# Patient Record
Sex: Male | Born: 1960
Health system: Southern US, Community
[De-identification: ages and names within clinical notes are randomized; demographics above are authoritative.]

## PROBLEM LIST (undated history)

## (undated) ENCOUNTER — Emergency Department (HOSPITAL_COMMUNITY): Admission: EM | Payer: Self-pay | Source: Home / Self Care

## (undated) DIAGNOSIS — Z720 Tobacco use: Secondary | ICD-10-CM

## (undated) DIAGNOSIS — B20 Human immunodeficiency virus [HIV] disease: Secondary | ICD-10-CM

## (undated) DIAGNOSIS — R918 Other nonspecific abnormal finding of lung field: Secondary | ICD-10-CM

## (undated) DIAGNOSIS — J449 Chronic obstructive pulmonary disease, unspecified: Secondary | ICD-10-CM

## (undated) DIAGNOSIS — J189 Pneumonia, unspecified organism: Secondary | ICD-10-CM

## (undated) DIAGNOSIS — R0600 Dyspnea, unspecified: Secondary | ICD-10-CM

## (undated) DIAGNOSIS — M549 Dorsalgia, unspecified: Secondary | ICD-10-CM

## (undated) DIAGNOSIS — Z21 Asymptomatic human immunodeficiency virus [HIV] infection status: Secondary | ICD-10-CM

## (undated) HISTORY — DX: Other nonspecific abnormal finding of lung field: R91.8

## (undated) HISTORY — PX: APPENDECTOMY: SHX54

## (undated) HISTORY — DX: Tobacco use: Z72.0

## (undated) HISTORY — PX: SHOULDER SURGERY: SHX246

---

## 2000-11-15 ENCOUNTER — Encounter (INDEPENDENT_AMBULATORY_CARE_PROVIDER_SITE_OTHER): Payer: Self-pay | Admitting: *Deleted

## 2000-11-15 ENCOUNTER — Inpatient Hospital Stay (HOSPITAL_COMMUNITY): Admission: EM | Admit: 2000-11-15 | Discharge: 2000-11-16 | Payer: Self-pay | Admitting: Emergency Medicine

## 2000-11-15 ENCOUNTER — Encounter: Payer: Self-pay | Admitting: Emergency Medicine

## 2002-11-14 ENCOUNTER — Emergency Department (HOSPITAL_COMMUNITY): Admission: EM | Admit: 2002-11-14 | Discharge: 2002-11-14 | Payer: Self-pay

## 2002-11-15 ENCOUNTER — Emergency Department (HOSPITAL_COMMUNITY): Admission: EM | Admit: 2002-11-15 | Discharge: 2002-11-15 | Payer: Self-pay | Admitting: Emergency Medicine

## 2002-11-21 ENCOUNTER — Ambulatory Visit (HOSPITAL_COMMUNITY): Admission: RE | Admit: 2002-11-21 | Discharge: 2002-11-21 | Payer: Self-pay | Admitting: Sports Medicine

## 2005-08-31 ENCOUNTER — Emergency Department (HOSPITAL_COMMUNITY): Admission: EM | Admit: 2005-08-31 | Discharge: 2005-08-31 | Payer: Self-pay | Admitting: Emergency Medicine

## 2008-01-30 ENCOUNTER — Emergency Department (HOSPITAL_COMMUNITY): Admission: EM | Admit: 2008-01-30 | Discharge: 2008-01-30 | Payer: Self-pay | Admitting: Emergency Medicine

## 2008-02-09 ENCOUNTER — Encounter: Admission: RE | Admit: 2008-02-09 | Discharge: 2008-02-09 | Payer: Self-pay | Admitting: Internal Medicine

## 2008-10-19 ENCOUNTER — Emergency Department (HOSPITAL_COMMUNITY): Admission: EM | Admit: 2008-10-19 | Discharge: 2008-10-19 | Payer: Self-pay | Admitting: Emergency Medicine

## 2010-03-01 ENCOUNTER — Emergency Department (HOSPITAL_COMMUNITY)
Admission: EM | Admit: 2010-03-01 | Discharge: 2010-03-01 | Disposition: A | Discharge status: Home / Self Care | Payer: Self-pay | Attending: Emergency Medicine | Admitting: Emergency Medicine

## 2010-03-01 ENCOUNTER — Emergency Department (HOSPITAL_COMMUNITY): Payer: Self-pay

## 2010-03-01 DIAGNOSIS — J449 Chronic obstructive pulmonary disease, unspecified: Secondary | ICD-10-CM | POA: Insufficient documentation

## 2010-03-01 DIAGNOSIS — R079 Chest pain, unspecified: Secondary | ICD-10-CM | POA: Insufficient documentation

## 2010-03-01 DIAGNOSIS — J4489 Other specified chronic obstructive pulmonary disease: Secondary | ICD-10-CM | POA: Insufficient documentation

## 2010-03-01 DIAGNOSIS — F17200 Nicotine dependence, unspecified, uncomplicated: Secondary | ICD-10-CM | POA: Insufficient documentation

## 2010-03-01 DIAGNOSIS — H5789 Other specified disorders of eye and adnexa: Secondary | ICD-10-CM | POA: Insufficient documentation

## 2010-03-01 LAB — POCT I-STAT, CHEM 8
Calcium, Ion: 1.09 mmol/L — ABNORMAL LOW (ref 1.12–1.32)
Glucose, Bld: 85 mg/dL (ref 70–99)
HCT: 45 % (ref 39.0–52.0)
Hemoglobin: 15.3 g/dL (ref 13.0–17.0)
Potassium: 4 mEq/L (ref 3.5–5.1)

## 2010-03-01 LAB — CBC
Hemoglobin: 15 g/dL (ref 13.0–17.0)
MCHC: 35.4 g/dL (ref 30.0–36.0)
RDW: 13.4 % (ref 11.5–15.5)

## 2010-03-01 LAB — POCT CARDIAC MARKERS: Myoglobin, poc: 54.3 ng/mL (ref 12–200)

## 2010-03-06 ENCOUNTER — Institutional Professional Consult (permissible substitution) (INDEPENDENT_AMBULATORY_CARE_PROVIDER_SITE_OTHER): Payer: Self-pay | Admitting: Cardiovascular Disease

## 2010-03-06 DIAGNOSIS — R079 Chest pain, unspecified: Secondary | ICD-10-CM

## 2010-05-25 NOTE — Op Note (Signed)
[facility]  Patient:    [name], Calvin Schroeder Visit Number: [id number] MRN: [id number]          Service Type: MED Location: 3W [id number] Attending Physician:  Meredith Leeds Dictated by:   Zigmund Daniel, M.D. Proc. Date: 11/15/00 Admit Date:  11/15/2000 Discharge Date: 11/16/2000                             Operative Report  PREOPERATIVE DIAGNOSIS:   Acute appendicitis.  POSTOPERATIVE DIAGNOSIS:  Acute appendicitis.  OPERATION:  Laparoscopic appendectomy.  SURGEON:  Zigmund Daniel, M.D.  ANESTHESIA:  General.  DESCRIPTION OF PROCEDURE:  After the patient had adequate monitoring and induction of general anesthesia, insertion of a Foley catheter and routine preparation and draping of the abdomen, I made a short transverse incision just below his umbilicus.  I dissected down through the fascia and opened it longitudinally in the midline for about 1.5 cm.  I then placed an O Vicryl pursestring suture and secured a Hasson cannula after bluntly entering the peritoneum.  There was no free fluid.  The small bowel and colon appeared normal.  The small bowel and colon appeared normal.  The appendix was enlarged and inflamed.  I placed a right upper quadrant 5 mm port and suprapubic 12 mm port after anesthetizing the sites.  I then grasped the appendix and elevated it and found it to be mobile.  I dissected a little bit of the bulk of the mesoappendix away and then stabilized across the mesoappendix and base of the appendix with the endovascular height cutting stapler.  That almost completely divided the appendix but one further firing of the stapler was necessary and then it came of cleanly.  The closure appeared secure, and hemostasis was good.  I then removed the appendix from the body through the umbilical incision and a plastic pouch.  I checked again for hemostasis and found it to be good.  I tied the pursestring suture and then removed the  lateral port under direct vision and the suprapubic port was removed after releasing the CO2.  I closed all the skin incisions with intracuticular 4-0 Vicryl.  I closed the umbilical incision with the pursestring suture and the fascia. Sponge, needle and instrument counts were correct.  The patient was stable through the operation. Dictated by:   Zigmund Daniel, M.D. Attending Physician:  Meredith Leeds DD:  11/15/00 TD:  11/17/00 Job: [id number]

## 2012-06-24 ENCOUNTER — Encounter (HOSPITAL_COMMUNITY): Payer: Self-pay | Admitting: *Deleted

## 2012-06-24 ENCOUNTER — Emergency Department (HOSPITAL_COMMUNITY)
Admission: EM | Admit: 2012-06-24 | Discharge: 2012-06-24 | Disposition: A | Discharge status: Home / Self Care | Payer: Self-pay | Attending: Emergency Medicine | Admitting: Emergency Medicine

## 2012-06-24 DIAGNOSIS — K047 Periapical abscess without sinus: Secondary | ICD-10-CM | POA: Insufficient documentation

## 2012-06-24 DIAGNOSIS — F17200 Nicotine dependence, unspecified, uncomplicated: Secondary | ICD-10-CM | POA: Insufficient documentation

## 2012-06-24 HISTORY — DX: Dorsalgia, unspecified: M54.9

## 2012-06-24 MED ORDER — PENICILLIN V POTASSIUM 500 MG PO TABS: 500.0000 mg | ORAL_TABLET | Freq: Three times a day (TID) | ORAL | Status: DC

## 2012-06-24 NOTE — Progress Notes (Signed)
[facility] has seen patient and provided him with a OC application, as well as, a list pf primary care resources, highlighting [facility].

## 2012-06-24 NOTE — ED Notes (Signed)
Pt states the past 11 days has had a R sided tooth abscess, been taking amoxicillin, states today abscess "popped in mouth" and he is tasting puss and blood now, pt states in severe pain, R side of face swollen.

## 2012-06-24 NOTE — ED Provider Notes (Signed)
History     CSN: [id number]  Arrival date & time 06/24/12  0911   First MD Initiated Contact with Patient 06/24/12 0915      Chief Complaint  Patient presents with  . Dental Pain    (Consider location/radiation/quality/duration/timing/severity/associated sxs/prior treatment) HPI Comments: The patient is a 52 year old otherwise healthy male who presents with dental pain that started gradually 11 days ago. The dental pain is severe, constant and progressively worsening. The pain is aching and located in right upper jaw. The pain does not radiate. Eating makes the pain worse. Nothing makes the pain better. Patient reports taking his friend's amoxicillin for the abscess and today the abscess popped in his mouth and he is now tasting blood and pus. The patient has not tried anything for pain. No associated symptoms. Patient denies headache, neck pain/stiffness, fever, NVD, edema, sore throat, throat swelling, wheezing, SOB, chest pain, abdominal pain.      Past Medical History  Diagnosis Date  . Back pain     Past Surgical History  Procedure Laterality Date  . Appendectomy    . Shoulder surgery      No family history on file.  History  Substance Use Topics  . Smoking status: Current Every Day Smoker  . Smokeless tobacco: Never Used  . Alcohol Use: Yes      Review of Systems  HENT: Positive for dental problem.   All other systems reviewed and are negative.    Allergies  Review of patient's allergies indicates no known allergies.  Home Medications   Current Outpatient Rx  Name  Route  Sig  Dispense  Refill  . amoxicillin (AMOXIL) 500 MG capsule   Oral   Take 500 mg by mouth 3 (three) times daily. Started on 06-13-12 for 10 day therapy         . naproxen sodium (ANAPROX) 220 MG tablet   Oral   Take 220 mg by mouth 2 (two) times daily with a meal.           BP 131/78  Pulse 94  Temp(Src) 98.8 F (37.1 C) (Oral)  Resp 18  SpO2 95%  Physical Exam   Nursing note and vitals reviewed. Constitutional: He appears well-developed and well-nourished. No distress.  HENT:  Head: Normocephalic and atraumatic.  Mouth/Throat: No oropharyngeal exudate.  Poor dentition. Area of fluctuance noted at gingival fold superior to right upper molars.   Eyes: Conjunctivae and EOM are normal.  Neck: Normal range of motion.  Cardiovascular: Normal rate and regular rhythm.  Exam reveals no gallop and no friction rub.   No murmur heard. Pulmonary/Chest: Effort normal and breath sounds normal. He has no wheezes. He has no rales. He exhibits no tenderness.  Musculoskeletal: Normal range of motion.  Lymphadenopathy:    He has no cervical adenopathy.  Neurological: He is alert.  Speech is goal-oriented. Moves limbs without ataxia.   Skin: Skin is warm and dry.  Psychiatric: He has a normal mood and affect. His behavior is normal.    ED Course  Procedures (including critical care time)  Labs Reviewed - No data to display No results found.   1. Dental abscess       MDM  9:54 AM Dr. Juleen China drained the abscess. Patient will be discharged with antibiotics. Patient declines pain medication. Vitals stable and patient afebrile. Patient will have dentist follow up.        Emilia Beck, [place] 06/25/12 [phone]

## 2012-06-25 NOTE — ED Provider Notes (Signed)
Medical screening examination/treatment/procedure(s) were performed by non-physician practitioner and as supervising physician I was immediately available for consultation/collaboration.  Trayvond Viets, MD 06/25/12 1653 

## 2012-09-03 ENCOUNTER — Emergency Department (HOSPITAL_COMMUNITY)
Admission: EM | Admit: 2012-09-03 | Discharge: 2012-09-03 | Disposition: A | Discharge status: Home / Self Care | Payer: Self-pay | Attending: Emergency Medicine | Admitting: Emergency Medicine

## 2012-09-03 ENCOUNTER — Emergency Department (HOSPITAL_COMMUNITY): Payer: Self-pay

## 2012-09-03 ENCOUNTER — Encounter (HOSPITAL_COMMUNITY): Payer: Self-pay | Admitting: Emergency Medicine

## 2012-09-03 DIAGNOSIS — R509 Fever, unspecified: Secondary | ICD-10-CM | POA: Insufficient documentation

## 2012-09-03 DIAGNOSIS — R5381 Other malaise: Secondary | ICD-10-CM | POA: Insufficient documentation

## 2012-09-03 DIAGNOSIS — Z791 Long term (current) use of non-steroidal anti-inflammatories (NSAID): Secondary | ICD-10-CM | POA: Insufficient documentation

## 2012-09-03 DIAGNOSIS — Z792 Long term (current) use of antibiotics: Secondary | ICD-10-CM | POA: Insufficient documentation

## 2012-09-03 DIAGNOSIS — J159 Unspecified bacterial pneumonia: Secondary | ICD-10-CM | POA: Insufficient documentation

## 2012-09-03 DIAGNOSIS — R05 Cough: Secondary | ICD-10-CM | POA: Insufficient documentation

## 2012-09-03 DIAGNOSIS — J189 Pneumonia, unspecified organism: Secondary | ICD-10-CM

## 2012-09-03 DIAGNOSIS — K047 Periapical abscess without sinus: Secondary | ICD-10-CM | POA: Insufficient documentation

## 2012-09-03 DIAGNOSIS — M549 Dorsalgia, unspecified: Secondary | ICD-10-CM | POA: Insufficient documentation

## 2012-09-03 DIAGNOSIS — R059 Cough, unspecified: Secondary | ICD-10-CM | POA: Insufficient documentation

## 2012-09-03 DIAGNOSIS — R112 Nausea with vomiting, unspecified: Secondary | ICD-10-CM | POA: Insufficient documentation

## 2012-09-03 DIAGNOSIS — R630 Anorexia: Secondary | ICD-10-CM | POA: Insufficient documentation

## 2012-09-03 DIAGNOSIS — R109 Unspecified abdominal pain: Secondary | ICD-10-CM | POA: Insufficient documentation

## 2012-09-03 DIAGNOSIS — R220 Localized swelling, mass and lump, head: Secondary | ICD-10-CM | POA: Insufficient documentation

## 2012-09-03 DIAGNOSIS — R0789 Other chest pain: Secondary | ICD-10-CM | POA: Insufficient documentation

## 2012-09-03 DIAGNOSIS — F17200 Nicotine dependence, unspecified, uncomplicated: Secondary | ICD-10-CM | POA: Insufficient documentation

## 2012-09-03 LAB — BASIC METABOLIC PANEL
CO2: 22 mEq/L (ref 19–32)
Chloride: 98 mEq/L (ref 96–112)
Sodium: 129 mEq/L — ABNORMAL LOW (ref 135–145)

## 2012-09-03 LAB — CBC WITH DIFFERENTIAL/PLATELET
Eosinophils Relative: 0 % (ref 0–5)
HCT: 48.3 % (ref 39.0–52.0)
Lymphocytes Relative: 10 % — ABNORMAL LOW (ref 12–46)
Lymphs Abs: 0.9 10*3/uL (ref 0.7–4.0)
MCV: 95.6 fL (ref 78.0–100.0)
Monocytes Absolute: 1.2 10*3/uL — ABNORMAL HIGH (ref 0.1–1.0)
Platelets: 151 10*3/uL (ref 150–400)
RBC: 5.05 MIL/uL (ref 4.22–5.81)
WBC: 9.5 10*3/uL (ref 4.0–10.5)

## 2012-09-03 LAB — TROPONIN I: Troponin I: <0.3 ng/mL (ref <0.30)

## 2012-09-03 MED ORDER — ONDANSETRON 4 MG PO TBDP: 4.0000 mg | ORAL_TABLET | Freq: Three times a day (TID) | ORAL | Status: DC | PRN

## 2012-09-03 MED ORDER — AZITHROMYCIN 250 MG PO TABS: 250.0000 mg | ORAL_TABLET | Freq: Every day | ORAL | Status: DC

## 2012-09-03 MED ORDER — AZITHROMYCIN 250 MG PO TABS
500.0000 mg | ORAL_TABLET | Freq: Once | ORAL | Status: AC
  Administered 2012-09-03: 500 mg via ORAL
  Filled 2012-09-03: qty 2

## 2012-09-03 MED ORDER — ONDANSETRON HCL 4 MG PO TABS
4.0000 mg | ORAL_TABLET | Freq: Once | ORAL | Status: AC
  Administered 2012-09-03: 4 mg via ORAL
  Filled 2012-09-03: qty 1

## 2012-09-03 MED ORDER — PENICILLIN V POTASSIUM 500 MG PO TABS: 500.0000 mg | ORAL_TABLET | Freq: Three times a day (TID) | ORAL | Status: DC

## 2012-09-03 MED ORDER — IOHEXOL 300 MG/ML  SOLN
100.0000 mL | Freq: Once | INTRAMUSCULAR | Status: AC | PRN
  Administered 2012-09-03: 80 mL via INTRAVENOUS

## 2012-09-03 MED ORDER — SODIUM CHLORIDE 0.9 % IV BOLUS (SEPSIS)
1000.0000 mL | Freq: Once | INTRAVENOUS | Status: AC
  Administered 2012-09-03: 1000 mL via INTRAVENOUS

## 2012-09-03 NOTE — ED Notes (Signed)
Returned from CT.

## 2012-09-03 NOTE — ED Provider Notes (Signed)
CSN: [id number]     Arrival date & time 09/03/12  1823 History  This chart was scribed for Coral Ceo, PA working with Raeford Razor, MD by Quintella Reichert, ED Scribe. This patient was seen in room WTR7/WTR7 and the patient's care was started at 6:38 PM.    Chief Complaint  Patient presents with  . Abscess  . Back Pain   And The history is provided by the patient. No language interpreter was used.    HPI Comments: Calvin Schroeder is a 52 y.o. male who presents to the Emergency Department complaining of an abscess to the inside of his right upper cheek that has been present for 2-3 months.  He also has had 2 days of associated nausea, vomiting, decreased appetite, low-grade subjective fever, and chills.  Pt states that he has a bad tooth in the area of the abscess.  The abscess is only painful on palpation and he denies dental pain.  He has not taken any pain medications pta.  He notes that he see in the ED and was on antibiotics (penicillin) 2 months ago (06/24/12) for the same dental abscess, but it never completely resolved.  He did not follow up with a dentist.  Pt states he has been vomiting "bile" and vomited 3 times today.  He did not take his temperature pta and on admission temperature is 98.9 F.  Pt also notes that 2 days ago he was pulling a nail out of a post and "strained a muscle" and suddenly developed mid-back pain that has resolved somewhat since then.  He is concerned that he "popped a capillary" in his cheek at that time and that this may be why he developed his other symptoms simultaneously.  He also complains of mild left-sided dull chest pain, which is present only after vomiting.  No chest pain at rest.  He also has chest pain with deep breathing.  He is a current every-day smoker and has a chronic "smoker's cough."  No hx of heart disease.  No FH of cardiac disease.  Patient denies cocaine use.  He denies diarrhea, constipation, ear pain, numbness or tingling in legs, bowel  or bladder incontinence, or any other associated symptoms. He denies chronic medical conditions or regular medication usage.     Past Medical History  Diagnosis Date  . Back pain    Past Surgical History  Procedure Laterality Date  . Appendectomy    . Shoulder surgery     History reviewed. No pertinent family history. History  Substance Use Topics  . Smoking status: Current Every Day Smoker  . Smokeless tobacco: Never Used  . Alcohol Use: Yes    Review of Systems  Constitutional: Positive for fever (subjective and not measured at home), chills, appetite change and fatigue. Negative for diaphoresis and activity change.  HENT: Positive for facial swelling and dental problem. Negative for ear pain, congestion, sore throat, rhinorrhea, mouth sores, trouble swallowing, neck pain and neck stiffness.        Oral abscess  Eyes: Negative for photophobia and visual disturbance.  Respiratory: Positive for cough. Negative for shortness of breath and wheezing.   Cardiovascular: Positive for chest pain. Negative for palpitations and leg swelling.  Gastrointestinal: Positive for nausea, vomiting and abdominal pain (only after emesis). Negative for diarrhea, constipation and rectal pain.  Genitourinary: Negative for dysuria, hematuria and difficulty urinating.  Musculoskeletal: Positive for back pain. Negative for myalgias, joint swelling and gait problem.  Skin: Negative for wound.  Neurological: Negative for dizziness, syncope, weakness, light-headedness, numbness and headaches.  All other systems reviewed and are negative.    Allergies  Review of patient's allergies indicates no known allergies.  Home Medications   Current Outpatient Rx  Name  Route  Sig  Dispense  Refill  . amoxicillin (AMOXIL) 500 MG capsule   Oral   Take 500 mg by mouth 3 (three) times daily. Started on 06-13-12 for 10 day therapy         . naproxen sodium (ANAPROX) 220 MG tablet   Oral   Take 220 mg by mouth 2  (two) times daily with a meal.         . penicillin v potassium (VEETID) 500 MG tablet   Oral   Take 1 tablet (500 mg total) by mouth 3 (three) times daily.   30 tablet   0    BP 132/71  Pulse 86  Temp(Src) 98.9 F (37.2 C) (Oral)  Resp 20  SpO2 98%  Filed Vitals:   09/03/12 2115 09/03/12 2130 09/03/12 2200 09/03/12 2245  BP:    140/77  Pulse: 79 92 87 85  Temp:    98.2 F (36.8 C)  TempSrc:    Oral  Resp: 24 20 17 16   SpO2: 96% 98% 99% 98%   In Physical Exam  Nursing note and vitals reviewed. Constitutional: He is oriented to person, place, and time. He appears well-developed and well-nourished. No distress.  HENT:  Head: Normocephalic and atraumatic.    Right Ear: External ear normal.  Left Ear: External ear normal.  Nose: Nose normal.  Mouth/Throat: Oropharynx is clear and moist. No oropharyngeal exudate.  Non-fluctuant non-indurated mass approximately 2 cm x 2 cm palpated in the right lower cheek.  Small area of the abscess is palpated in the right upper gingival fold.  Poor dentition throughout  Eyes: Conjunctivae and EOM are normal. Pupils are equal, round, and reactive to light. Right eye exhibits no discharge. Left eye exhibits no discharge.  Neck: Normal range of motion. Neck supple. No tracheal deviation present.  Cardiovascular: Normal rate, regular rhythm, normal heart sounds and intact distal pulses.   No murmur heard. DP pulses present and equal bilaterally  Pulmonary/Chest: Effort normal and breath sounds normal. No respiratory distress. He has no wheezes. He has no rales.  Abdominal: Soft. He exhibits no distension. There is no tenderness. There is no rebound and no guarding.  Musculoskeletal: Normal range of motion. He exhibits no edema and no tenderness.  No lumbar or thoracic spinal or paraspinal tenderness to palpation.  Tenderness to palpation to the left lateral ribs diffusely with no palpable fractures, crepitus, or overlying  lesions/erythema/eccymosis/rash.  Patient able to ambulate without difficulty or ataxia.  Strength 5/5 in the lower extremities bilaterally  Neurological: He is alert and oriented to person, place, and time. Coordination and gait normal.  Gross sensation intact in the lower extremities bilaterally  Skin: Skin is warm and dry.  Psychiatric: He has a normal mood and affect. His behavior is normal.    ED Course  Procedures (including critical care time)  DIAGNOSTIC STUDIES: Oxygen Saturation is 98% on room air, normal by my interpretation.    COORDINATION OF CARE: 6:55 PM-Discussed treatment plan which includes Zofran in ED to see if pt can tolerate oral intake with pt at bedside and pt agreed to plan.   Labs Review Labs Reviewed - No data to display  Imaging Review No results found.  DG Chest 2  View (Final result)  Result time: 09/03/12 19:29:12    Final result by Rad Results In Interface (09/03/12 19:29:12)    Narrative:   *RADIOLOGY REPORT*  Clinical Data: Back pain, fever, smoker  CHEST - 2 VIEW  Comparison: 03/01/2010  Findings: Normal heart size, mediastinal contours, and pulmonary vascularity. Emphysematous and minimal bronchitic changes consistent with COPD. Opacity at medial left lung base may represent infiltrate or mass. Remaining lungs clear. No pleural effusion or pneumothorax. Bones unremarkable.  IMPRESSION: Opacity at medial left lung base, suspect posteriorly and medial left lower lobe, potentially infiltrate though mass lesion is not excluded. If the patient has signs and symptoms of pneumonia or leukocytosis, recommend follow-up chest radiographs in 2-4 weeks to ensure resolution in order to exclude mass lesion. Otherwise, recommend CT chest with contrast to exclude left lower lobe mass.   Original Report Authenticated By: Ulyses Southward, M.D.   Results for orders placed during the hospital encounter of 09/03/12  TROPONIN I      Result Value Range    Troponin I <0.30  <0.30 ng/mL  BASIC METABOLIC PANEL      Result Value Range   Sodium 129 (*) 135 - 145 mEq/L   Potassium 3.4 (*) 3.5 - 5.1 mEq/L   Chloride 98  96 - 112 mEq/L   CO2 22  19 - 32 mEq/L   Glucose, Bld 123 (*) 70 - 99 mg/dL   BUN 11  6 - 23 mg/dL   Creatinine, Ser 1.61  0.50 - 1.35 mg/dL   Calcium 8.6  8.4 - 09.6 mg/dL   GFR calc non Af Amer >90  >90 mL/min   GFR calc Af Amer >90  >90 mL/min  CBC WITH DIFFERENTIAL      Result Value Range   WBC 9.5  4.0 - 10.5 K/uL   RBC 5.05  4.22 - 5.81 MIL/uL   Hemoglobin 17.3 (*) 13.0 - 17.0 g/dL   HCT 04.5  40.9 - 81.1 %   MCV 95.6  78.0 - 100.0 fL   MCH 34.3 (*) 26.0 - 34.0 pg   MCHC 35.8  30.0 - 36.0 g/dL   RDW 91.4  78.2 - 95.6 %   Platelets 151  150 - 400 K/uL   Neutrophils Relative % 78 (*) 43 - 77 %   Neutro Abs 7.3  1.7 - 7.7 K/uL   Lymphocytes Relative 10 (*) 12 - 46 %   Lymphs Abs 0.9  0.7 - 4.0 K/uL   Monocytes Relative 13 (*) 3 - 12 %   Monocytes Absolute 1.2 (*) 0.1 - 1.0 K/uL   Eosinophils Relative 0  0 - 5 %   Eosinophils Absolute 0.0  0.0 - 0.7 K/uL   Basophils Relative 0  0 - 1 %   Basophils Absolute 0.0  0.0 - 0.1 K/uL     Date: 09/03/2012  Rate: 91  Rhythm: normal sinus rhythm with LVH   QRS Axis: normal  Intervals: normal  ST/T Wave abnormalities: normal  Conduction Disutrbances:none  Narrative Interpretation:   Old EKG Reviewed: 03/01/10 - unchanged   MDM   1. Community acquired pneumonia   2. Dental abscess      Calvin Schroeder is a 52 year old male who presents to the Emergency Department complaining of an abscess to the inside of his right cheek that has been present for 2-3 months, with 2 days of associated nausea, vomiting, decreased appetite, low-grade subjective fever, and chills.  CBC, BMP, troponin, EKG  and chest x-ray ordered to further evaluate.     Rechecks  9:00 PM = Spoke with patient he would like to get CT scan to figure out what is going on.  He states his nausea has  improved with Zofran.     9:20 = Signed out care to [facility].  Chest x-ray concerning for pneumonia vs. mass. CT ordered to further evaluate.  Troponin negative.  No acute ischemic changes on EKG.  Patient informed of his results.  He was encouraged to follow-up with a cardiologist.  Will await CT scan results.  No leukocytosis or fever at this time. Patient was given IV fluids for dehydration.  Patient will require dental follow-up for his dental abscess as well as OP antibiotics. Abscess not I&D's at this time.  Patient was also staffed with Dr. Blinda Leatherwood.      Luiz Iron PA-C   I personally performed the services described in this documentation, which was scribed in my presence. The recorded information has been reviewed and is accurate.  Jillyn Ledger, PA-C 09/04/12 1009

## 2012-09-03 NOTE — ED Notes (Signed)
Pt c/o of "pulled muscle in back" when lifting boxes, mid back pain. Also c/o of abscess to right cheek. States that he has had it drained before. Swelling and redness.

## 2012-09-03 NOTE — ED Provider Notes (Signed)
10:37 PM Patient seen and examined. Work-up initiated. Medications ordered.   Vital signs reviewed and are as follows: Filed Vitals:   09/03/12 1947  BP: 129/77  Pulse:   Temp:   Resp: 20   CT reviewed by myself. Consolidation appears to be PNA. Will start on azithro. Also given penicillin due to dental related abscess. Pt informed of results.   Patient urged to return with worsening symptoms or other concerns including high fever, N/V, worsening chest or back pain. Patient verbalized understanding and agrees with plan.     Renne Crigler, PA-C 09/05/12 [phone]

## 2012-09-07 NOTE — ED Provider Notes (Signed)
Medical screening examination/treatment/procedure(s) were performed by non-physician practitioner and as supervising physician I was immediately available for consultation/collaboration.    Manuelita Moxon J. Neo Yepiz, MD 09/07/12 0730 

## 2012-09-07 NOTE — ED Provider Notes (Signed)
Medical screening examination/treatment/procedure(s) were performed by non-physician practitioner and as supervising physician I was immediately available for consultation/collaboration.    Gilda Crease, MD 09/07/12 0730

## 2013-01-06 ENCOUNTER — Encounter (HOSPITAL_COMMUNITY): Payer: Self-pay | Admitting: Emergency Medicine

## 2013-01-06 ENCOUNTER — Emergency Department (HOSPITAL_COMMUNITY)
Admission: EM | Admit: 2013-01-06 | Discharge: 2013-01-06 | Disposition: A | Discharge status: Home / Self Care | Payer: Self-pay | Attending: Emergency Medicine | Admitting: Emergency Medicine

## 2013-01-06 DIAGNOSIS — M545 Low back pain, unspecified: Secondary | ICD-10-CM | POA: Insufficient documentation

## 2013-01-06 DIAGNOSIS — G8929 Other chronic pain: Secondary | ICD-10-CM | POA: Insufficient documentation

## 2013-01-06 DIAGNOSIS — R52 Pain, unspecified: Secondary | ICD-10-CM | POA: Insufficient documentation

## 2013-01-06 DIAGNOSIS — F17200 Nicotine dependence, unspecified, uncomplicated: Secondary | ICD-10-CM | POA: Insufficient documentation

## 2013-01-06 MED ORDER — OXYCODONE-ACETAMINOPHEN 5-325 MG PO TABS
1.0000 | ORAL_TABLET | Freq: Once | ORAL | Status: AC
  Administered 2013-01-06: 1 via ORAL
  Filled 2013-01-06: qty 1

## 2013-01-06 MED ORDER — PREDNISONE 10 MG PO TABS: ORAL_TABLET | ORAL | Status: DC

## 2013-01-06 MED ORDER — CYCLOBENZAPRINE HCL 10 MG PO TABS: 10.0000 mg | ORAL_TABLET | Freq: Two times a day (BID) | ORAL | Status: DC | PRN

## 2013-01-06 MED ORDER — OXYCODONE-ACETAMINOPHEN 5-325 MG PO TABS: 1.0000 | ORAL_TABLET | ORAL | Status: DC | PRN

## 2013-01-06 MED ORDER — PREDNISONE 20 MG PO TABS
60.0000 mg | ORAL_TABLET | Freq: Once | ORAL | Status: AC
  Administered 2013-01-06: 60 mg via ORAL
  Filled 2013-01-06: qty 3

## 2013-01-06 NOTE — ED Notes (Addendum)
Pt escorted to discharge window. Verbalized understanding discharge instructions. In no acute distress.  Pt educated to not drive or operate heavy machinery.

## 2013-01-06 NOTE — ED Provider Notes (Signed)
CSN: [id number]     Arrival date & time 01/06/13  0508 History   First MD Initiated Contact with Patient 01/06/13 [phone]     Chief Complaint  Patient presents with  . Tailbone Pain   (Consider location/radiation/quality/duration/timing/severity/associated sxs/prior Treatment) Patient is a 52 y.o. male presenting with back pain. The history is provided by the patient. No language interpreter was used.  Back Pain Location:  Lumbar spine Radiates to:  R thigh Pain severity:  Severe Associated symptoms: no fever and no numbness   Associated symptoms comment:  Lower back pain that feels like an exacerbation of recurrent/chronic pain since heavy lifting a couple days ago. No urinary or bowel incontinence. The pain radiates into right leg like it has in the past.    Past Medical History  Diagnosis Date  . Back pain    Past Surgical History  Procedure Laterality Date  . Appendectomy    . Shoulder surgery     History reviewed. No pertinent family history. History  Substance Use Topics  . Smoking status: Current Every Day Smoker  . Smokeless tobacco: Never Used  . Alcohol Use: Yes    Review of Systems  Constitutional: Negative for fever and chills.  Gastrointestinal: Negative.   Genitourinary: Negative.  Negative for difficulty urinating.  Musculoskeletal: Positive for back pain.       See HPI  Skin: Negative.   Neurological: Negative.  Negative for numbness.    Allergies  Review of patient's allergies indicates no known allergies.  Home Medications  No current outpatient prescriptions on file. BP 109/80  Pulse 78  Temp(Src) 98 F (36.7 C) (Oral)  SpO2 100% Physical Exam  Constitutional: He is oriented to person, place, and time. He appears well-developed and well-nourished.  Neck: Normal range of motion.  Pulmonary/Chest: Effort normal.  Abdominal: Soft. He exhibits no mass. There is no tenderness.  Musculoskeletal: Normal range of motion.  Left paralumbar tenderness  without swelling, discoloration.   Neurological: He is alert and oriented to person, place, and time. He has normal reflexes. No sensory deficit.  Skin: Skin is warm and dry.  Psychiatric: He has a normal mood and affect.    ED Course  Procedures (including critical care time) Labs Review Labs Reviewed - No data to display Imaging Review No results found.  EKG Interpretation   None       MDM  No diagnosis found. 1. Acute on chronic lower back pain  No neurologic red flags. VSS.     Arnoldo Hooker, PA-C 01/06/13 [phone]

## 2013-01-06 NOTE — ED Notes (Signed)
Patient has history of back pain. Patient states he lifted a heavy object 3 days ago and has had lower back pain since.

## 2013-01-18 NOTE — ED Provider Notes (Signed)
Medical screening examination/treatment/procedure(s) were performed by non-physician practitioner and as supervising physician I was immediately available for consultation/collaboration.  EKG Interpretation   None         Tanna Furry, MD 01/18/13 [phone]

## 2013-06-01 ENCOUNTER — Telehealth: Payer: Self-pay | Admitting: Hematology and Oncology

## 2013-06-01 NOTE — Telephone Encounter (Signed)
S/W PATIENT AND GAVE NP APPT FOR 06/04 @ 11 W/DR, GORSUCH.  [place] PACKET MAILED.

## 2013-06-08 ENCOUNTER — Telehealth: Payer: Self-pay | Admitting: Hematology and Oncology

## 2013-06-08 NOTE — Telephone Encounter (Signed)
[facility]  REFERRAL # [id number] START DATE 06/07/2013 NUMBER OF VISIT 6

## 2013-06-10 ENCOUNTER — Ambulatory Visit (HOSPITAL_BASED_OUTPATIENT_CLINIC_OR_DEPARTMENT_OTHER): Payer: 59 | Admitting: Hematology and Oncology

## 2013-06-10 ENCOUNTER — Ambulatory Visit: Payer: 59

## 2013-06-10 ENCOUNTER — Ambulatory Visit (HOSPITAL_COMMUNITY)
Admission: RE | Admit: 2013-06-10 | Discharge: 2013-06-10 | Disposition: A | Discharge status: Home / Self Care | Payer: 59 | Source: Ambulatory Visit | Attending: Hematology and Oncology | Admitting: Hematology and Oncology

## 2013-06-10 ENCOUNTER — Telehealth: Payer: Self-pay | Admitting: Hematology and Oncology

## 2013-06-10 ENCOUNTER — Ambulatory Visit (HOSPITAL_BASED_OUTPATIENT_CLINIC_OR_DEPARTMENT_OTHER): Payer: 59

## 2013-06-10 ENCOUNTER — Encounter: Payer: Self-pay | Admitting: Hematology and Oncology

## 2013-06-10 VITALS — BP 113/79 | HR 72 | Temp 98.1°F | Resp 18 | Ht 70.0 in | Wt 139.8 lb

## 2013-06-10 DIAGNOSIS — R918 Other nonspecific abnormal finding of lung field: Secondary | ICD-10-CM

## 2013-06-10 DIAGNOSIS — Z720 Tobacco use: Secondary | ICD-10-CM

## 2013-06-10 DIAGNOSIS — J984 Other disorders of lung: Secondary | ICD-10-CM | POA: Insufficient documentation

## 2013-06-10 DIAGNOSIS — D751 Secondary polycythemia: Secondary | ICD-10-CM

## 2013-06-10 DIAGNOSIS — D696 Thrombocytopenia, unspecified: Secondary | ICD-10-CM

## 2013-06-10 DIAGNOSIS — D72819 Decreased white blood cell count, unspecified: Secondary | ICD-10-CM

## 2013-06-10 DIAGNOSIS — F17200 Nicotine dependence, unspecified, uncomplicated: Secondary | ICD-10-CM

## 2013-06-10 DIAGNOSIS — R222 Localized swelling, mass and lump, trunk: Secondary | ICD-10-CM

## 2013-06-10 DIAGNOSIS — Z87891 Personal history of nicotine dependence: Secondary | ICD-10-CM | POA: Insufficient documentation

## 2013-06-10 HISTORY — DX: Other nonspecific abnormal finding of lung field: R91.8

## 2013-06-10 HISTORY — DX: Tobacco use: Z72.0

## 2013-06-10 LAB — COMPREHENSIVE METABOLIC PANEL (CC13)
ALK PHOS: 74 U/L (ref 40–150)
ALT: 28 U/L (ref 0–55)
AST: 28 U/L (ref 5–34)
Albumin: 3.4 g/dL — ABNORMAL LOW (ref 3.5–5.0)
Anion Gap: 11 mEq/L (ref 3–11)
BUN: 13.2 mg/dL (ref 7.0–26.0)
CHLORIDE: 105 meq/L (ref 98–109)
CO2: 23 mEq/L (ref 22–29)
Calcium: 8.5 mg/dL (ref 8.4–10.4)
Creatinine: 0.7 mg/dL (ref 0.7–1.3)
Glucose: 89 mg/dl (ref 70–140)
POTASSIUM: 3.9 meq/L (ref 3.5–5.1)
SODIUM: 139 meq/L (ref 136–145)
TOTAL PROTEIN: 7.8 g/dL (ref 6.4–8.3)
Total Bilirubin: 0.6 mg/dL (ref 0.20–1.20)

## 2013-06-10 LAB — CBC WITH DIFFERENTIAL/PLATELET
BASO%: 0.2 % (ref 0.0–2.0)
Basophils Absolute: 0 10*3/uL (ref 0.0–0.1)
EOS ABS: 0.1 10*3/uL (ref 0.0–0.5)
EOS%: 2.2 % (ref 0.0–7.0)
HEMATOCRIT: 50.8 % — AB (ref 38.4–49.9)
HGB: 17.8 g/dL — ABNORMAL HIGH (ref 13.0–17.1)
LYMPH#: 1.2 10*3/uL (ref 0.9–3.3)
LYMPH%: 29.6 % (ref 14.0–49.0)
MCH: 34.6 pg — ABNORMAL HIGH (ref 27.2–33.4)
MCHC: 35 g/dL (ref 32.0–36.0)
MCV: 98.8 fL — ABNORMAL HIGH (ref 79.3–98.0)
MONO#: 0.6 10*3/uL (ref 0.1–0.9)
MONO%: 13.2 % (ref 0.0–14.0)
NEUT%: 54.8 % (ref 39.0–75.0)
NEUTROS ABS: 2.3 10*3/uL (ref 1.5–6.5)
Platelets: 121 10*3/uL — ABNORMAL LOW (ref 140–400)
RBC: 5.14 10*6/uL (ref 4.20–5.82)
RDW: 13.9 % (ref 11.0–14.6)
WBC: 4.2 10*3/uL (ref 4.0–10.3)

## 2013-06-10 LAB — FERRITIN CHCC: FERRITIN: 123 ng/mL (ref 22–316)

## 2013-06-10 MED ORDER — SODIUM CHLORIDE 0.9 % IV SOLN
Freq: Once | INTRAVENOUS | Status: AC
  Administered 2013-06-10: 500 mL via INTRAVENOUS

## 2013-06-10 NOTE — Progress Notes (Signed)
Removed 230cc blood per phlebotomy from left [facility] before site clotted off; patient then received 500cc normal saline bolus, then removed 280cc from right AC before clotted off; patient tolerated well; discharge instructions given to patient; verbalized understanding of next appointment; post vital signs stable; no complaints at discharge.   Total removed = 520cc

## 2013-06-10 NOTE — Assessment & Plan Note (Signed)
This is likely due to smoking and emphysema. I will order an additional workup to exclude myeloproliferative disorder. In the meantime, I recommend him phlebotomy today along with aspirin therapy daily to prevent risk of blood clot.

## 2013-06-10 NOTE — Assessment & Plan Note (Signed)
I reviewed his last imaging study with him and his wife. Due to his smoking history, I am very concerned about the abnormal lung mass noted on the left lower lobe. I recommend repeat checks x-ray to exclude persistent mass. If the chest x-ray is abnormal, I will proceed with CT imaging study.

## 2013-06-10 NOTE — Telephone Encounter (Signed)
gv adn printede appt sched and avs for June...sed added tx.Marland KitchenMarland Kitchenpt will go to lab after phl

## 2013-06-10 NOTE — Assessment & Plan Note (Addendum)
I spent some time counseling the patient the importance of tobacco cessation. He is not interested to quit.

## 2013-06-10 NOTE — Patient Instructions (Signed)
Therapeutic Phlebotomy Therapeutic phlebotomy is the controlled removal of blood from your body for the purpose of treating a medical condition. It is similar to donating blood. Usually, about a pint (470 mL) of blood is removed. The average adult has 9 to 12 pints (4.3 to 5.7 L) of blood. Therapeutic phlebotomy may be used to treat the following medical conditions:  Hemochromatosis. This is a condition in which there is too much iron in the blood.  Polycythemia vera. This is a condition in which there are too many red cells in the blood.  Porphyria cutanea tarda. This is a disease usually passed from one generation to the next (inherited). It is a condition in which an important part of hemoglobin is not made properly. This results in the build up of abnormal amounts of porphyrins in the body.  Sickle cell disease. This is an inherited disease. It is a condition in which the red blood cells form an abnormal crescent shape rather than a round shape. LET YOUR CAREGIVER KNOW ABOUT:  Allergies.  Medicines taken including herbs, eyedrops, over-the-counter medicines, and creams.  Use of steroids (by mouth or creams).  Previous problems with anesthetics or numbing medicine.  History of blood clots.  History of bleeding or blood problems.  Previous surgery.  Possibility of pregnancy, if this applies. RISKS AND COMPLICATIONS This is a simple and safe procedure. Problems are unlikely. However, problems can occur and may include:  Nausea or lightheadedness.  Low blood pressure.  Soreness, bleeding, swelling, or bruising at the needle insertion site.  Infection. BEFORE THE PROCEDURE  This is a procedure that can be done as an outpatient. Confirm the time that you need to arrive for your procedure. Confirm whether there is a need to fast or withhold any medications. It is helpful to wear clothing with sleeves that can be raised above the elbow. A blood sample may be done to determine the  amount of red blood cells or iron in your blood. Plan ahead of time to have someone drive you home after the procedure. PROCEDURE The entire procedure from preparation through recovery takes about 1 hour. The actual collection takes about 10 to 15 minutes.  A needle will be inserted into your vein.  Tubing and a collection bag will be attached to that needle.  Blood will flow through the needle and tubing into the collection bag.  You may be asked to open and close your hand slowly and continuously during the entire collection.  Once the specified amount of blood has been removed from your body, the collection bag and tubing will be clamped.  The needle will be removed.  Pressure will be held on the site of the needle insertion to stop the bleeding. Then a bandage will be placed over the needle insertion site. AFTER THE PROCEDURE  Your recovery will be assessed and monitored. If there are no problems, as an outpatient, you should be able to go home shortly after the procedure.  Document Released: 05/28/2010 Document Revised: 03/18/2011 Document Reviewed: 05/28/2010 [facility] Patient Information 2014 [place], [place].  Therapeutic Phlebotomy Care After Refer to this sheet in the next few weeks. These instructions provide you with information on caring for yourself after your procedure. Your caregiver may also give you more specific instructions. Your treatment has been planned according to current medical practices, but problems sometimes occur. Call your caregiver if you have any problems or questions after your procedure. HOME CARE INSTRUCTIONS Most people can go back to their normal  activities right away. Before you leave, be sure to ask if there is anything you should or should not do. In general, it would be wise to:  Keep the bandage dry. You can remove the bandage after about 5 hours.  Eat well-balanced meals for the next 24 hours.  Drink enough fluids to keep your urine clear or  pale yellow.  Avoid drinking alcohol minimally until after eating.  Avoid smoking for at least 30 minutes after the procedure.  Avoid strenous physical activity or heavy lifting or pulling for about 5 hours after the procedure.  Athletes should avoid strenous exercise for 12 hours or more.  Change positions slowly for the remainder of the day to prevent lightheadedness or fainting.  If you feel lightheaded, lie down until the feeling subsides.  If you have bleeding from the needle insertion site, elevate your arm and press firmly on the site until the bleeding stops.  If bruising or bleeding appears under the skin, apply ice to the area for 15 to 20 minutes, 3 to 4 times per day. Put the ice in a plastic bag and place a towel between the bag of ice and your skin. Do this while you are awake for the first 24 hours. The ice packs can be stopped before 24 hours if the swelling goes away. If swelling persists after 24 hours, a warm, moist washcloth can be applied to the area for 15 to 20 minutes, 3 to 4 times per day. The warm, moist treatments can be stopped when the swelling goes away.  It is important to continue further therapeutic phlebotomy as directed by your caregiver. SEEK MEDICAL CARE IF:  There is bleeding or fluid leaking from the needle insertion site.  The needle insertion site becomes swollen, red, or sore.  You feel lightheaded, dizzy or nauseated, and the feeling does not go away.  You notice new bruising at the needle insertion site.  You feel more weak or tired than normal.  You develop a fever. SEEK IMMEDIATE MEDICAL CARE IF:   There is increased bleeding, pain, or swelling from the needle insertion site.  You have severe nausea or vomiting.  You have chest pain.  You have trouble breathing. MAKE SURE YOU:  Understand these instructions.  Will watch your condition.  Will get help right away if you are not doing well or get worse. Document Released:  05/28/2010 Document Revised: 03/18/2011 Document Reviewed: 05/28/2010 [facility] Patient Information 2014 [place], [place].

## 2013-06-10 NOTE — Progress Notes (Signed)
[place] NOTE  Patient Care Team: [name], MD as PCP - General (Internal Medicine)  CHIEF COMPLAINTS/PURPOSE OF CONSULTATION:  Polycythemia  HISTORY OF PRESENTING ILLNESS:  Calvin Schroeder 53 y.o. male is here because of elevated hemoglobin.  He was found to have abnormal CBC from routine blood work. His last blood work from August 2014 showed hemoglobin of 17.3. Recently, he was noted to have mild leukopenia and thrombocytopenia. Please see scanned report for further details. The patient is a smoker and currently smokes 1-2 pack of cigarettes per day for the last 34 years. The patient denies intermittent headaches.  He denies any shortness of breath on exertion. He complains of frequent leg cramps and occasional chest pain. He never suffer from diagnosis of blood clot.  No prior diagnosis of obstructive sleep apnea. He denies weight loss or skin itching.   MEDICAL HISTORY:  Past Medical History  Diagnosis Date  . Back pain   . Lung mass 06/10/2013  . Tobacco abuse 06/10/2013    SURGICAL HISTORY: Past Surgical History  Procedure Laterality Date  . Appendectomy    . Shoulder surgery      SOCIAL HISTORY: History   Social History  . Marital Status: Single    Spouse Name: N/A    Number of Children: N/A  . Years of Education: N/A   Occupational History  . Not on file.   Social History Main Topics  . Smoking status: Current Every Day Smoker -- 1.50 packs/day for 34 years  . Smokeless tobacco: Never Used  . Alcohol Use: Yes  . Drug Use: Yes    Special: Marijuana  . Sexual Activity: Not on file   Other Topics Concern  . Not on file   Social History Narrative  . No narrative on file    FAMILY HISTORY: Family History  Problem Relation Age of Onset  . Cancer Mother     lung ca  . Cancer Father     skin ca  . Cancer Maternal Uncle     liver ca  . Cancer Maternal Grandmother     bladder ca    ALLERGIES:  has No Known  Allergies.  MEDICATIONS:  No current outpatient prescriptions on file.   No current facility-administered medications for this visit.    REVIEW OF SYSTEMS:   Constitutional: Denies fevers, chills or abnormal night sweats Eyes: Denies blurriness of vision, double vision or watery eyes Ears, nose, mouth, throat, and face: Denies mucositis or sore throat Respiratory: Denies cough, dyspnea or wheezes Gastrointestinal:  Denies nausea, heartburn or change in bowel habits Skin: Denies abnormal skin rashes Lymphatics: Denies new lymphadenopathy or easy bruising Neurological:Denies numbness, tingling or new weaknesses Behavioral/Psych: Mood is stable, no new changes  All other systems were reviewed with the patient and are negative.  PHYSICAL EXAMINATION: ECOG PERFORMANCE STATUS: 0 - Asymptomatic  Filed Vitals:   06/10/13 1134  BP: 113/79  Pulse: 72  Temp: 98.1 F (36.7 C)  Resp: 18   Filed Weights   06/10/13 1134  Weight: 139 lb 12.8 oz (63.413 kg)    GENERAL:alert, no distress and comfortable. He looks thin but not cachectic. SKIN: skin color is plethoric, texture, turgor are normal, no rashes or significant lesions EYES: normal, conjunctiva are pink and non-injected, sclera clear OROPHARYNX:no exudate, no erythema and lips, buccal mucosa, and tongue normal  NECK: supple, thyroid normal size, non-tender, without nodularity LYMPH:  no palpable lymphadenopathy in the cervical, axillary or inguinal  LUNGS: clear to auscultation and percussion with normal breathing effort HEART: regular rate & rhythm and no murmurs and no lower extremity edema ABDOMEN:abdomen soft, non-tender and normal bowel sounds. No splenomegaly Musculoskeletal:no cyanosis of digits and no clubbing  PSYCH: alert & oriented x 3 with fluent speech NEURO: no focal motor/sensory deficits  LABORATORY DATA:  I have reviewed the data as listed Recent Results (from the past 2160 hour(s))  CBC WITH DIFFERENTIAL      Status: Abnormal   Collection Time    06/10/13  1:14 PM      Result Value Ref Range   WBC 4.2  4.0 - 10.3 10e3/uL   NEUT# 2.3  1.5 - 6.5 10e3/uL   HGB 17.8 (*) 13.0 - 17.1 g/dL   HCT 50.8 (*) 38.4 - 49.9 %   Platelets 121 (*) 140 - 400 10e3/uL   MCV 98.8 (*) 79.3 - 98.0 fL   MCH 34.6 (*) 27.2 - 33.4 pg   MCHC 35.0  32.0 - 36.0 g/dL   RBC 5.14  4.20 - 5.82 10e6/uL   RDW 13.9  11.0 - 14.6 %   lymph# 1.2  0.9 - 3.3 10e3/uL   MONO# 0.6  0.1 - 0.9 10e3/uL   Eosinophils Absolute 0.1  0.0 - 0.5 10e3/uL   Basophils Absolute 0.0  0.0 - 0.1 10e3/uL   NEUT% 54.8  39.0 - 75.0 %   LYMPH% 29.6  14.0 - 49.0 %   MONO% 13.2  0.0 - 14.0 %   EOS% 2.2  0.0 - 7.0 %   BASO% 0.2  0.0 - 2.0 %  FERRITIN [facility]     Status: None   Collection Time    06/10/13  1:14 PM      Result Value Ref Range   Ferritin 123  22 - 316 ng/ml  COMPREHENSIVE METABOLIC PANEL (KV42)     Status: Abnormal   Collection Time    06/10/13  1:14 PM      Result Value Ref Range   Sodium 139  136 - 145 mEq/L   Potassium 3.9  3.5 - 5.1 mEq/L   Chloride 105  98 - 109 mEq/L   CO2 23  22 - 29 mEq/L   Glucose 89  70 - 140 mg/dl   BUN 13.2  7.0 - 26.0 mg/dL   Creatinine 0.7  0.7 - 1.3 mg/dL   Total Bilirubin 0.60  0.20 - 1.20 mg/dL   Alkaline Phosphatase 74  40 - 150 U/L   AST 28  5 - 34 U/L   ALT 28  0 - 55 U/L   Total Protein 7.8  6.4 - 8.3 g/dL   Albumin 3.4 (*) 3.5 - 5.0 g/dL   Calcium 8.5  8.4 - 10.4 mg/dL   Anion Gap 11  3 - 11 mEq/L    RADIOGRAPHIC STUDIES: I also reviewed his prior CT scan which showed emphysematous changes but also a new mass in the left lower lobe I have personally reviewed the radiological images as listed and agreed with the findings in the report. Dg Chest 2 View  06/10/2013   CLINICAL DATA:  History of lung mass and history of smoking  EXAM: CHEST  2 VIEW  COMPARISON:  CT scan of the chest of September 03, 2012 and PA and lateral chest x-ray of the same date.  FINDINGS: The lungs are hyperinflated with  hemidiaphragm flattening. Previously demonstrated density in the left lower paravertebral region is not evident today. There is subtle  increased density which projects over the posterior aspect of the eighth, ninth rib which is stable. There is no pleural effusion or pneumothorax. The heart and mediastinal structures are normal. The bony thorax is unremarkable.  IMPRESSION: There is no residual abnormality in the left lower lobe medially. There are stable emphysematous change but no acute cardiopulmonary abnormality.   Electronically Signed   By: David  Martinique   On: 06/10/2013 16:17    ASSESSMENT & PLAN Erythrocytosis This is likely due to smoking and emphysema. I will order an additional workup to exclude myeloproliferative disorder. In the meantime, I recommend him phlebotomy today along with aspirin therapy daily to prevent risk of blood clot.  Tobacco abuse I spent some time counseling the patient the importance of tobacco cessation. He is not interested to quit.   Lung mass I reviewed his last imaging study with him and his wife. Due to his smoking history, I am very concerned about the abnormal lung mass noted on the left lower lobe. I recommend repeat checks x-ray to exclude persistent mass. If the chest x-ray is abnormal, I will proceed with CT imaging study.

## 2013-06-15 LAB — ERYTHROPOIETIN: Erythropoietin: 4.3 m[IU]/mL (ref 2.6–18.5)

## 2013-06-29 ENCOUNTER — Ambulatory Visit: Payer: 59 | Admitting: Hematology and Oncology

## 2013-06-29 ENCOUNTER — Telehealth: Payer: Self-pay | Admitting: *Deleted

## 2013-06-29 ENCOUNTER — Telehealth: Payer: Self-pay | Admitting: Nurse Practitioner

## 2013-06-29 ENCOUNTER — Other Ambulatory Visit: Payer: 59

## 2013-06-29 NOTE — Telephone Encounter (Signed)
RN returned patient's previous call asking lab results and CXR report. Per MD, chest Xray okay. Hgb is increased and pt may need more phlebotomy. Pt verbalized understanding. He is asking specifically hgb and RBC values, RN informed him. Pt wants to reschedule appt for early august when insurance coverage restarts. Pt instructed to call scheduling to get appt and he verbalizes understanding.

## 2013-06-29 NOTE — Telephone Encounter (Signed)
Pt left a message that he was unable to keep appt today due to insurance being cancelled. Would like results of CXR and labs. Wants to reschedule appointment for August

## 2013-07-14 ENCOUNTER — Telehealth: Payer: Self-pay | Admitting: Hematology and Oncology

## 2013-07-14 NOTE — Telephone Encounter (Signed)
pt called to r/s missed appt...done....pt aware of new d.t °

## 2013-07-20 ENCOUNTER — Encounter: Payer: Self-pay | Admitting: Hematology and Oncology

## 2013-07-20 ENCOUNTER — Ambulatory Visit (HOSPITAL_BASED_OUTPATIENT_CLINIC_OR_DEPARTMENT_OTHER): Payer: BC Managed Care – PPO | Admitting: Hematology and Oncology

## 2013-07-20 ENCOUNTER — Other Ambulatory Visit (HOSPITAL_BASED_OUTPATIENT_CLINIC_OR_DEPARTMENT_OTHER): Payer: BC Managed Care – PPO

## 2013-07-20 VITALS — BP 115/73 | HR 67 | Temp 97.9°F | Resp 18 | Ht 70.0 in | Wt 139.8 lb

## 2013-07-20 DIAGNOSIS — D751 Secondary polycythemia: Secondary | ICD-10-CM

## 2013-07-20 DIAGNOSIS — F17200 Nicotine dependence, unspecified, uncomplicated: Secondary | ICD-10-CM

## 2013-07-20 DIAGNOSIS — R918 Other nonspecific abnormal finding of lung field: Secondary | ICD-10-CM

## 2013-07-20 DIAGNOSIS — R222 Localized swelling, mass and lump, trunk: Secondary | ICD-10-CM

## 2013-07-20 DIAGNOSIS — Z720 Tobacco use: Secondary | ICD-10-CM

## 2013-07-20 LAB — CBC WITH DIFFERENTIAL/PLATELET
BASO%: 0.3 % (ref 0.0–2.0)
Basophils Absolute: 0 10*3/uL (ref 0.0–0.1)
EOS%: 2 % (ref 0.0–7.0)
Eosinophils Absolute: 0.1 10*3/uL (ref 0.0–0.5)
HCT: 44.1 % (ref 38.4–49.9)
HGB: 14.8 g/dL (ref 13.0–17.1)
LYMPH%: 33 % (ref 14.0–49.0)
MCH: 33.1 pg (ref 27.2–33.4)
MCHC: 33.5 g/dL (ref 32.0–36.0)
MCV: 98.8 fL — AB (ref 79.3–98.0)
MONO#: 0.5 10*3/uL (ref 0.1–0.9)
MONO%: 9.8 % (ref 0.0–14.0)
NEUT#: 2.6 10*3/uL (ref 1.5–6.5)
NEUT%: 54.9 % (ref 39.0–75.0)
PLATELETS: 155 10*3/uL (ref 140–400)
RBC: 4.47 10*6/uL (ref 4.20–5.82)
RDW: 13.2 % (ref 11.0–14.6)
WBC: 4.8 10*3/uL (ref 4.0–10.3)
lymph#: 1.6 10*3/uL (ref 0.9–3.3)

## 2013-07-20 NOTE — Assessment & Plan Note (Signed)
I spent some time counseling the patient the importance of tobacco cessation. he is currently attempting to quit on his own 

## 2013-07-20 NOTE — Progress Notes (Signed)
[facility] OFF[facility] PROGRESS NOTE  Calvin Pel, MD   SUMMARY OF HEMATOLOGIC HISTORY:  He was found to have abnormal CBC from routine blood work. His last blood work from August 2014 showed hemoglobin of 17.3. Recently, he was noted to have mild leukopenia and thrombocytopenia. Please see scanned report for further details. The patient is a smoker and currently smokes 1-2 pack of cigarettes per day for the last 34 years.  He complains of frequent leg cramps and occasional chest pain. He never suffer from diagnosis of blood clot.  No prior diagnosis of obstructive sleep apnea. He denies weight loss or skin itching. In June 2015, he had phlebotomy. Blood work is not consistent with myeloproliferative disorder. He was recommended regular phlebotomy and aspirin. INTERVAL HISTORY: Calvin Schroeder 53 y.o. male returns for further workup. He feels well. He quit drinking but still smokes.  I have reviewed the past medical history, past surgical history, social history and family history with the patient and they are unchanged from previous note.  ALLERGIES:  has No Known Allergies.  MEDICATIONS:  Current Outpatient Prescriptions  Medication Sig Dispense Refill  . aspirin 81 MG tablet Take 81 mg by mouth daily.      . Multiple Vitamin (MULTIVITAMIN) capsule Take 1 capsule by mouth daily.       No current facility-administered medications for this visit.     REVIEW OF SYSTEMS:   Constitutional: Denies fevers, chills or night sweats Eyes: Denies blurriness of vision Ears, nose, mouth, throat, and face: Denies mucositis or sore throat Respiratory: Denies cough, dyspnea or wheezes Cardiovascular: Denies palpitation, chest discomfort or lower extremity swelling Gastrointestinal:  Denies nausea, heartburn or change in bowel habits Skin: Denies abnormal skin rashes Lymphatics: Denies new lymphadenopathy or easy bruising Neurological:Denies numbness, tingling or new  weaknesses Behavioral/Psych: Mood is stable, no new changes  All other systems were reviewed with the patient and are negative.  PHYSICAL EXAMINATION: ECOG PERFORMANCE STATUS: 0 - Asymptomatic  Filed Vitals:   07/20/13 1241  BP: 115/73  Pulse: 67  Temp: 97.9 F (36.6 C)  Resp: 18   Filed Weights   07/20/13 1241  Weight: 139 lb 12.8 oz (63.413 kg)    GENERAL:alert, no distress and comfortable SKIN: skin color, texture, turgor are normal, no rashes or significant lesions. He looks plethoric EYES: normal, Conjunctiva are pink and non-injected, sclera clear NEURO: alert & oriented x 3 with fluent speech, no focal motor/sensory deficits  LABORATORY DATA:  I have reviewed the data as listed Results for orders placed in visit on 07/20/13 (from the past 48 hour(s))  CBC WITH DIFFERENTIAL     Status: Abnormal   Collection Time    07/20/13 12:10 PM      Result Value Ref Range   WBC 4.8  4.0 - 10.3 10e3/uL   NEUT# 2.6  1.5 - 6.5 10e3/uL   HGB 14.8  13.0 - 17.1 g/dL   HCT 44.1  38.4 - 49.9 %   Platelets 155  140 - 400 10e3/uL   MCV 98.8 (*) 79.3 - 98.0 fL   MCH 33.1  27.2 - 33.4 pg   MCHC 33.5  32.0 - 36.0 g/dL   RBC 4.47  4.20 - 5.82 10e6/uL   RDW 13.2  11.0 - 14.6 %   lymph# 1.6  0.9 - 3.3 10e3/uL   MONO# 0.5  0.1 - 0.9 10e3/uL   Eosinophils Absolute 0.1  0.0 - 0.5 10e3/uL   Basophils Absolute 0.0  0.0 - 0.1 10e3/uL   NEUT% 54.9  39.0 - 75.0 %   LYMPH% 33.0  14.0 - 49.0 %   MONO% 9.8  0.0 - 14.0 %   EOS% 2.0  0.0 - 7.0 %   BASO% 0.3  0.0 - 2.0 %    Lab Results  Component Value Date   WBC 4.8 07/20/2013   HGB 14.8 07/20/2013   HCT 44.1 07/20/2013   MCV 98.8* 07/20/2013   PLT 155 07/20/2013    RADIOGRAPHIC STUDIES: X-rays show no mass. I have personally reviewed the radiological images as listed and agreed with the findings in the report.  ASSESSMENT & PLAN:  Erythrocytosis This is likely due to smoking and emphysema. His blood work is not compatible with  myeloproliferative disorder. In the meantime, I recommend regular phlebotomy along with aspirin therapy daily to prevent risk of blood clot.    Lung mass Checks x-ray is normal. I recommend he stops smoking.  Tobacco abuse I spent some time counseling the patient the importance of tobacco cessation. he is currently attempting to quit on his own    All questions were answered. The patient knows to call the clinic with any problems, questions or concerns. No barriers to learning was detected.  I spent 15 minutes counseling the patient face to face. The total time spent in the appointment was 20 minutes and more than 50% was on counseling.     [facility], Montserrat Shek, MD 07/20/2013 5:53 PM

## 2013-07-20 NOTE — Assessment & Plan Note (Signed)
Checks x-ray is normal. I recommend he stops smoking.

## 2013-07-20 NOTE — Assessment & Plan Note (Signed)
This is likely due to smoking and emphysema. His blood work is not compatible with myeloproliferative disorder. In the meantime, I recommend regular phlebotomy along with aspirin therapy daily to prevent risk of blood clot.

## 2013-08-17 ENCOUNTER — Telehealth: Payer: Self-pay | Admitting: Hematology and Oncology

## 2013-08-17 NOTE — Telephone Encounter (Signed)
Fax medical records to [facility] to Dr. Shelia Media

## 2013-11-18 ENCOUNTER — Other Ambulatory Visit: Payer: Self-pay | Admitting: Hematology and Oncology

## 2014-01-04 ENCOUNTER — Emergency Department (HOSPITAL_COMMUNITY): Payer: BC Managed Care – PPO

## 2014-01-04 ENCOUNTER — Emergency Department (HOSPITAL_COMMUNITY)
Admission: EM | Admit: 2014-01-04 | Discharge: 2014-01-04 | Disposition: A | Discharge status: Home / Self Care | Payer: BC Managed Care – PPO | Attending: Emergency Medicine | Admitting: Emergency Medicine

## 2014-01-04 ENCOUNTER — Encounter (HOSPITAL_COMMUNITY): Payer: Self-pay

## 2014-01-04 DIAGNOSIS — Z720 Tobacco use: Secondary | ICD-10-CM | POA: Diagnosis not present

## 2014-01-04 DIAGNOSIS — J159 Unspecified bacterial pneumonia: Secondary | ICD-10-CM | POA: Insufficient documentation

## 2014-01-04 DIAGNOSIS — R05 Cough: Secondary | ICD-10-CM | POA: Diagnosis present

## 2014-01-04 DIAGNOSIS — Z7982 Long term (current) use of aspirin: Secondary | ICD-10-CM | POA: Insufficient documentation

## 2014-01-04 DIAGNOSIS — M545 Low back pain: Secondary | ICD-10-CM | POA: Insufficient documentation

## 2014-01-04 DIAGNOSIS — J189 Pneumonia, unspecified organism: Secondary | ICD-10-CM

## 2014-01-04 DIAGNOSIS — R059 Cough, unspecified: Secondary | ICD-10-CM

## 2014-01-04 LAB — COMPREHENSIVE METABOLIC PANEL
ALBUMIN: 3.6 g/dL (ref 3.5–5.2)
ALT: 17 U/L (ref 0–53)
AST: 22 U/L (ref 0–37)
Alkaline Phosphatase: 81 U/L (ref 39–117)
Anion gap: 8 (ref 5–15)
BILIRUBIN TOTAL: 1.5 mg/dL — AB (ref 0.3–1.2)
BUN: 12 mg/dL (ref 6–23)
CHLORIDE: 98 meq/L (ref 96–112)
CO2: 24 mmol/L (ref 19–32)
CREATININE: 0.71 mg/dL (ref 0.50–1.35)
Calcium: 8.6 mg/dL (ref 8.4–10.5)
GFR calc Af Amer: >90 mL/min (ref >90)
Glucose, Bld: 119 mg/dL — ABNORMAL HIGH (ref 70–99)
Potassium: 3.8 mmol/L (ref 3.5–5.1)
Sodium: 130 mmol/L — ABNORMAL LOW (ref 135–145)
Total Protein: 8.6 g/dL — ABNORMAL HIGH (ref 6.0–8.3)

## 2014-01-04 LAB — CBC
HEMATOCRIT: 46.8 % (ref 39.0–52.0)
Hemoglobin: 16.3 g/dL (ref 13.0–17.0)
MCH: 32.9 pg (ref 26.0–34.0)
MCHC: 34.8 g/dL (ref 30.0–36.0)
MCV: 94.4 fL (ref 78.0–100.0)
Platelets: 130 10*3/uL — ABNORMAL LOW (ref 150–400)
RBC: 4.96 MIL/uL (ref 4.22–5.81)
RDW: 13.3 % (ref 11.5–15.5)
WBC: 8.2 10*3/uL (ref 4.0–10.5)

## 2014-01-04 MED ORDER — ONDANSETRON HCL 4 MG/2ML IJ SOLN: 4.0000 mg | Freq: Once | INTRAMUSCULAR | Status: DC

## 2014-01-04 MED ORDER — AMOXICILLIN 500 MG PO CAPS: 500.0000 mg | ORAL_CAPSULE | Freq: Three times a day (TID) | ORAL | Status: DC

## 2014-01-04 MED ORDER — HYDROCODONE-ACETAMINOPHEN 5-325 MG PO TABS
2.0000 | ORAL_TABLET | Freq: Once | ORAL | Status: DC
  Filled 2014-01-04: qty 2

## 2014-01-04 MED ORDER — HYDROMORPHONE HCL 1 MG/ML IJ SOLN
1.0000 mg | Freq: Once | INTRAMUSCULAR | Status: DC
  Filled 2014-01-04: qty 1

## 2014-01-04 MED ORDER — SODIUM CHLORIDE 0.9 % IV BOLUS (SEPSIS)
1000.0000 mL | Freq: Once | INTRAVENOUS | Status: AC
  Administered 2014-01-04: 1000 mL via INTRAVENOUS

## 2014-01-04 MED ORDER — TRAMADOL HCL 50 MG PO TABS: 50.0000 mg | ORAL_TABLET | Freq: Four times a day (QID) | ORAL | Status: DC | PRN

## 2014-01-04 MED ORDER — METOCLOPRAMIDE HCL 5 MG/ML IJ SOLN
10.0000 mg | Freq: Once | INTRAMUSCULAR | Status: AC
  Administered 2014-01-04: 10 mg via INTRAVENOUS
  Filled 2014-01-04: qty 2

## 2014-01-04 MED ORDER — AZITHROMYCIN 500 MG IV SOLR
500.0000 mg | Freq: Once | INTRAVENOUS | Status: AC
  Administered 2014-01-04: 500 mg via INTRAVENOUS
  Filled 2014-01-04: qty 500

## 2014-01-04 MED ORDER — DEXTROSE 5 % IV SOLN
1.0000 g | Freq: Once | INTRAVENOUS | Status: AC
  Administered 2014-01-04: 1 g via INTRAVENOUS
  Filled 2014-01-04: qty 10

## 2014-01-04 MED ORDER — AZITHROMYCIN 250 MG PO TABS: ORAL_TABLET | ORAL | Status: DC

## 2014-01-04 NOTE — Discharge Instructions (Signed)
It was our pleasure to provide your ER care today - we hope that you feel better.  Rest. Drink plenty of fluids.  Take zithromax and amoxicillin (antibiotics) as prescribed.  You may take ultram as need for pain - no driving when taking.  You were given pain medication in the ER - no driving for the next 4 hours.  Follow up with primary care doctor in the next couple days for recheck.  Return to ER if worse, new symptoms, trouble breathing, other concern.     Pneumonia Pneumonia is an infection of the lungs.  CAUSES Pneumonia may be caused by bacteria or a virus. Usually, these infections are caused by breathing infectious particles into the lungs (respiratory tract). SIGNS AND SYMPTOMS   Cough.  Fever.  Chest pain.  Increased rate of breathing.  Wheezing.  Mucus production. DIAGNOSIS  If you have the common symptoms of pneumonia, your health care provider will typically confirm the diagnosis with a chest X-ray. The X-ray will show an abnormality in the lung (pulmonary infiltrate) if you have pneumonia. Other tests of your blood, urine, or sputum may be done to find the specific cause of your pneumonia. Your health care provider may also do tests (blood gases or pulse oximetry) to see how well your lungs are working. TREATMENT  Some forms of pneumonia may be spread to other people when you cough or sneeze. You may be asked to wear a mask before and during your exam. Pneumonia that is caused by bacteria is treated with antibiotic medicine. Pneumonia that is caused by the influenza virus may be treated with an antiviral medicine. Most other viral infections must run their course. These infections will not respond to antibiotics.  HOME CARE INSTRUCTIONS   Cough suppressants may be used if you are losing too much rest. However, coughing protects you by clearing your lungs. You should avoid using cough suppressants if you can.  Your health care provider may have prescribed  medicine if he or she thinks your pneumonia is caused by bacteria or influenza. Finish your medicine even if you start to feel better.  Your health care provider may also prescribe an expectorant. This loosens the mucus to be coughed up.  Take medicines only as directed by your health care provider.  Do not smoke. Smoking is a common cause of bronchitis and can contribute to pneumonia. If you are a smoker and continue to smoke, your cough may last several weeks after your pneumonia has cleared.  A cold steam vaporizer or humidifier in your room or home may help loosen mucus.  Coughing is often worse at night. Sleeping in a semi-upright position in a recliner or using a couple pillows under your head will help with this.  Get rest as you feel it is needed. Your body will usually let you know when you need to rest. PREVENTION A pneumococcal shot (vaccine) is available to prevent a common bacterial cause of pneumonia. This is usually suggested for:  People over 59 years old.  Patients on chemotherapy.  People with chronic lung problems, such as bronchitis or emphysema.  People with immune system problems. If you are over 65 or have a high risk condition, you may receive the pneumococcal vaccine if you have not received it before. In some countries, a routine influenza vaccine is also recommended. This vaccine can help prevent some cases of pneumonia.You may be offered the influenza vaccine as part of your care. If you smoke, it is time to  quit. You may receive instructions on how to stop smoking. Your health care provider can provide medicines and counseling to help you quit. SEEK MEDICAL CARE IF: You have a fever. SEEK IMMEDIATE MEDICAL CARE IF:   Your illness becomes worse. This is especially true if you are elderly or weakened from any other disease.  You cannot control your cough with suppressants and are losing sleep.  You begin coughing up blood.  You develop pain which is  getting worse or is uncontrolled with medicines.  Any of the symptoms which initially brought you in for treatment are getting worse rather than better.  You develop shortness of breath or chest pain. MAKE SURE YOU:   Understand these instructions.  Will watch your condition.  Will get help right away if you are not doing well or get worse. Document Released: 12/24/2004 Document Revised: 05/10/2013 Document Reviewed: 03/15/2010 [facility] Patient Information 2015 [place], [place]. This information is not intended to replace advice given to you by your health care provider. Make sure you discuss any questions you have with your health care provider.    Back Pain, Adult Low back pain is very common. About 1 in 5 people have back pain.The cause of low back pain is rarely dangerous. The pain often gets better over time.About half of people with a sudden onset of back pain feel better in just 2 weeks. About 8 in 10 people feel better by 6 weeks.  CAUSES Some common causes of back pain include:  Strain of the muscles or ligaments supporting the spine.  Wear and tear (degeneration) of the spinal discs.  Arthritis.  Direct injury to the back. DIAGNOSIS Most of the time, the direct cause of low back pain is not known.However, back pain can be treated effectively even when the exact cause of the pain is unknown.Answering your caregiver's questions about your overall health and symptoms is one of the most accurate ways to make sure the cause of your pain is not dangerous. If your caregiver needs more information, he or she may order lab work or imaging tests (X-rays or MRIs).However, even if imaging tests show changes in your back, this usually does not require surgery. HOME CARE INSTRUCTIONS For many people, back pain returns.Since low back pain is rarely dangerous, it is often a condition that people can learn to [facility] their own.   Remain active. It is stressful on the back to sit or  stand in one place. Do not sit, drive, or stand in one place for more than 30 minutes at a time. Take short walks on level surfaces as soon as pain allows.Try to increase the length of time you walk each day.  Do not stay in bed.Resting more than 1 or 2 days can delay your recovery.  Do not avoid exercise or work.Your body is made to move.It is not dangerous to be active, even though your back may hurt.Your back will likely heal faster if you return to being active before your pain is gone.  Pay attention to your body when you bend and lift. Many people have less discomfortwhen lifting if they bend their knees, keep the load close to their bodies,and avoid twisting. Often, the most comfortable positions are those that put less stress on your recovering back.  Find a comfortable position to sleep. Use a firm mattress and lie on your side with your knees slightly bent. If you lie on your back, put a pillow under your knees.  Only take over-the-counter or  prescription medicines as directed by your caregiver. Over-the-counter medicines to reduce pain and inflammation are often the most helpful.Your caregiver may prescribe muscle relaxant drugs.These medicines help dull your pain so you can more quickly return to your normal activities and healthy exercise.  Put ice on the injured area.  Put ice in a plastic bag.  Place a towel between your skin and the bag.  Leave the ice on for 15-20 minutes, 03-04 times a day for the first 2 to 3 days. After that, ice and heat may be alternated to reduce pain and spasms.  Ask your caregiver about trying back exercises and gentle massage. This may be of some benefit.  Avoid feeling anxious or stressed.Stress increases muscle tension and can worsen back pain.It is important to recognize when you are anxious or stressed and learn ways to manage it.Exercise is a great option. SEEK MEDICAL CARE IF:  You have pain that is not relieved with rest or  medicine.  You have pain that does not improve in 1 week.  You have new symptoms.  You are generally not feeling well. SEEK IMMEDIATE MEDICAL CARE IF:   You have pain that radiates from your back into your legs.  You develop new bowel or bladder control problems.  You have unusual weakness or numbness in your arms or legs.  You develop nausea or vomiting.  You develop abdominal pain.  You feel faint. Document Released: 12/24/2004 Document Revised: 06/25/2011 Document Reviewed: 04/27/2013 [facility] Patient Information 2015 [place], [place]. This information is not intended to replace advice given to you by your health care provider. Make sure you discuss any questions you have with your health care provider.

## 2014-01-04 NOTE — ED Provider Notes (Signed)
CSN: [id number]     Arrival date & time 01/04/14  4709 History   First MD Initiated Contact with Patient 01/04/14 [phone]     Chief Complaint  Patient presents with  . Cough     (Consider location/radiation/quality/duration/timing/severity/associated sxs/prior Treatment) The history is provided by the patient.  pt c/o non productive cough in the past few days. Subjective fevers. No chills/sweats. Also c/o lower back pain for the past several days.  Notes hx low back pain in past, feels similar. States recent bending/lifting, and back strain associated w exercise.  No radicular pain. Back pain is dull, moder, constant, non radiating. No numbness/weakness. No urinary incontinence or retention. Pt denies chest pain or discomfort. +nausea. No vomiting. No abd pain or distension. Having normal bms. No known ill contacts.        Past Medical History  Diagnosis Date  . Back pain   . Lung mass 06/10/2013  . Tobacco abuse 06/10/2013   Past Surgical History  Procedure Laterality Date  . Appendectomy    . Shoulder surgery     Family History  Problem Relation Age of Onset  . Cancer Mother     lung ca  . Cancer Father     skin ca  . Cancer Maternal Uncle     liver ca  . Cancer Maternal Grandmother     bladder ca   History  Substance Use Topics  . Smoking status: Current Every Day Smoker -- 1.50 packs/day for 34 years  . Smokeless tobacco: Never Used  . Alcohol Use: No    Review of Systems  Constitutional: Negative for chills and diaphoresis.  HENT: Negative for sore throat.   Eyes: Negative for redness.  Respiratory: Positive for cough. Negative for shortness of breath.   Cardiovascular: Negative for chest pain and leg swelling.  Gastrointestinal: Positive for nausea. Negative for vomiting, abdominal pain and diarrhea.  Genitourinary: Negative for dysuria and flank pain.  Musculoskeletal: Positive for back pain. Negative for neck pain.  Skin: Negative for rash.  Neurological:  Negative for weakness, numbness and headaches.  Hematological: Does not bruise/bleed easily.  Psychiatric/Behavioral: Negative for confusion.      Allergies  Vicodin  Home Medications   Prior to Admission medications   Medication Sig Start Date End Date Taking? Authorizing Provider  aspirin 81 MG tablet Take 81 mg by mouth daily.   Yes Historical Provider, MD  Multiple Vitamin (MULTIVITAMIN) capsule Take 1 capsule by mouth daily.   Yes Historical Provider, MD   BP 137/80 mmHg  Pulse 92  Temp(Src) 98.3 F (36.8 C) (Oral)  Resp 22  SpO2 96% Physical Exam  Constitutional: He is oriented to person, place, and time. He appears well-developed and well-nourished. No distress.  HENT:  Head: Atraumatic.  Mouth/Throat: Oropharynx is clear and moist.  Eyes: Conjunctivae are normal. No scleral icterus.  Neck: Neck supple. No tracheal deviation present.  No stiffness or rigidity  Cardiovascular: Normal rate, regular rhythm, normal heart sounds and intact distal pulses.  Exam reveals no gallop and no friction rub.   No murmur heard. Pulmonary/Chest: Effort normal. No accessory muscle usage. No respiratory distress. He has rales. He exhibits no tenderness.  Rales right mid  Abdominal: Soft. Bowel sounds are normal. He exhibits no distension. There is no tenderness.  Genitourinary:  No cva tenderness  Musculoskeletal: Normal range of motion.  CTLS spine, non tender, aligned, no step off. +lumbar muscular tenderness.   Neurological: He is alert and oriented to person,  place, and time.  Motor intact bil. Steady gait.   Skin: Skin is warm and dry. No rash noted. He is not diaphoretic.  Psychiatric: He has a normal mood and affect.  Nursing note and vitals reviewed.   ED Course  Procedures (including critical care time) Labs Review  Results for orders placed or performed during the hospital encounter of 01/04/14  CBC  Result Value Ref Range   WBC 8.2 4.0 - 10.5 K/uL   RBC 4.96  4.22 - 5.81 MIL/uL   Hemoglobin 16.3 13.0 - 17.0 g/dL   HCT 46.8 39.0 - 52.0 %   MCV 94.4 78.0 - 100.0 fL   MCH 32.9 26.0 - 34.0 pg   MCHC 34.8 30.0 - 36.0 g/dL   RDW 13.3 11.5 - 15.5 %   Platelets 130 (L) 150 - 400 K/uL  Comprehensive metabolic panel  Result Value Ref Range   Sodium 130 (L) 135 - 145 mmol/L   Potassium 3.8 3.5 - 5.1 mmol/L   Chloride 98 96 - 112 mEq/L   CO2 24 19 - 32 mmol/L   Glucose, Bld 119 (H) 70 - 99 mg/dL   BUN 12 6 - 23 mg/dL   Creatinine, Ser 0.71 0.50 - 1.35 mg/dL   Calcium 8.6 8.4 - 10.5 mg/dL   Total Protein 8.6 (H) 6.0 - 8.3 g/dL   Albumin 3.6 3.5 - 5.2 g/dL   AST 22 0 - 37 U/L   ALT 17 0 - 53 U/L   Alkaline Phosphatase 81 39 - 117 U/L   Total Bilirubin 1.5 (H) 0.3 - 1.2 mg/dL   GFR calc non Af Amer >90 >90 mL/min   GFR calc Af Amer >90 >90 mL/min   Anion gap 8 5 - 15   Dg Chest 2 View  01/04/2014   CLINICAL DATA:  Cough.  EXAM: CHEST  2 VIEW  COMPARISON:  June 10, 2013.  FINDINGS: The heart size and mediastinal contours are within normal limits. No pneumothorax or pleural effusion is noted. Left lung is clear. Increased airspace opacity is noted in the right middle lobe consistent with pneumonia. The visualized skeletal structures are unremarkable.  IMPRESSION: Right middle lobe pneumonia. Followup radiographs are recommended to ensure resolution and rule out underlying neoplasm.   Electronically Signed   By: Sabino Dick M.D.   On: 01/04/2014 09:37       MDM   Labs. Cxr.  Pt has ride, does not have to drive.   Requests pain med.  Reviewed nursing notes and prior charts for additional history.   pna on cxr, pt says symptoms seem similar to prior pna.   Will rx for cap.  Rocephin and zithromax iv.  Morphine iv for pain/back pain.   Recheck pt breathing comfortable, no inc wob, tolerating po well, pain controlled.  Pt appears stable for d/c.    Mirna Mires, MD 01/05/14 [phone]

## 2014-01-04 NOTE — ED Notes (Signed)
Pt allergic to Vicodin. MD aware

## 2014-01-04 NOTE — ED Notes (Signed)
Per pt, cough x 2 days.  Back pain.  Some back pain which is chronic but feels worse.  Nausea with some vomiting.  Slight temp with chills last.

## 2014-02-05 ENCOUNTER — Ambulatory Visit (HOSPITAL_COMMUNITY)
Admission: RE | Admit: 2014-02-05 | Discharge: 2014-02-05 | Disposition: A | Discharge status: Home / Self Care | Payer: BLUE CROSS/BLUE SHIELD | Source: Ambulatory Visit | Attending: Internal Medicine | Admitting: Internal Medicine

## 2014-02-05 ENCOUNTER — Other Ambulatory Visit (HOSPITAL_COMMUNITY): Payer: Self-pay | Admitting: Internal Medicine

## 2014-02-05 DIAGNOSIS — R059 Cough, unspecified: Secondary | ICD-10-CM

## 2014-02-05 DIAGNOSIS — R05 Cough: Secondary | ICD-10-CM

## 2014-02-05 DIAGNOSIS — J189 Pneumonia, unspecified organism: Secondary | ICD-10-CM | POA: Insufficient documentation

## 2014-12-27 ENCOUNTER — Other Ambulatory Visit: Payer: Self-pay | Admitting: Pain Medicine

## 2014-12-27 DIAGNOSIS — M545 Low back pain: Secondary | ICD-10-CM

## 2015-01-03 ENCOUNTER — Ambulatory Visit
Admission: RE | Admit: 2015-01-03 | Discharge: 2015-01-03 | Disposition: A | Discharge status: Home / Self Care | Payer: BLUE CROSS/BLUE SHIELD | Source: Ambulatory Visit | Attending: Pain Medicine | Admitting: Pain Medicine

## 2015-01-03 DIAGNOSIS — M545 Low back pain: Secondary | ICD-10-CM

## 2015-01-19 ENCOUNTER — Emergency Department (HOSPITAL_COMMUNITY)
Admission: EM | Admit: 2015-01-19 | Discharge: 2015-01-19 | Disposition: A | Discharge status: Home / Self Care | Payer: BLUE CROSS/BLUE SHIELD | Attending: Emergency Medicine | Admitting: Emergency Medicine

## 2015-01-19 ENCOUNTER — Encounter (HOSPITAL_COMMUNITY): Payer: Self-pay | Admitting: Emergency Medicine

## 2015-01-19 DIAGNOSIS — F17200 Nicotine dependence, unspecified, uncomplicated: Secondary | ICD-10-CM | POA: Insufficient documentation

## 2015-01-19 DIAGNOSIS — Z7982 Long term (current) use of aspirin: Secondary | ICD-10-CM | POA: Insufficient documentation

## 2015-01-19 DIAGNOSIS — B029 Zoster without complications: Secondary | ICD-10-CM | POA: Insufficient documentation

## 2015-01-19 MED ORDER — OXYCODONE-ACETAMINOPHEN 5-325 MG PO TABS: 1.0000 | ORAL_TABLET | ORAL | Status: DC | PRN

## 2015-01-19 MED ORDER — VALACYCLOVIR HCL 1 G PO TABS: 1000.0000 mg | ORAL_TABLET | Freq: Three times a day (TID) | ORAL | Status: AC

## 2015-01-19 MED ORDER — IBUPROFEN 600 MG PO TABS: 600.0000 mg | ORAL_TABLET | Freq: Four times a day (QID) | ORAL | Status: DC | PRN

## 2015-01-19 MED ORDER — PREDNISONE 10 MG PO TABS: 20.0000 mg | ORAL_TABLET | Freq: Every day | ORAL | Status: DC

## 2015-01-19 NOTE — ED Notes (Signed)
Pt states he has had a rash for 4 days  Pt states it is on the right side upper chest and goes around to his back  Pt does have a red rash that goes from his sternum around his shoulder to his spine  Rash has blisters and redness noted  Pt states it burns and itches  Pt states he never had chicken pox

## 2015-01-19 NOTE — ED Provider Notes (Signed)
CSN: [id number]     Arrival date & time 01/19/15  0538 History   First MD Initiated Contact with Patient 01/19/15 0715     Chief Complaint  Patient presents with  . Rash     (Consider location/radiation/quality/duration/timing/severity/associated sxs/prior Treatment) HPI   55 year old male with red, painful rash. Onset about 3 days ago. Initially started out as a few small painful red spots. Subsequently progressed over right upper anterior chest, right shoulder and right upper back. Does not cross the midline anteriorly or posteriorly. No drainage. No contacts with similar. No fever.  Past Medical History  Diagnosis Date  . Back pain   . Lung mass 06/10/2013  . Tobacco abuse 06/10/2013   Past Surgical History  Procedure Laterality Date  . Appendectomy    . Shoulder surgery     Family History  Problem Relation Age of Onset  . Cancer Mother     lung ca  . Cancer Father     skin ca  . Cancer Maternal Uncle     liver ca  . Cancer Maternal Grandmother     bladder ca   Social History  Substance Use Topics  . Smoking status: Current Every Day Smoker -- 1.50 packs/day for 34 years  . Smokeless tobacco: Never Used  . Alcohol Use: Yes     Comment: daily - 4-5 alcohol    Review of Systems  All systems reviewed and negative, other than as noted in HPI.   Allergies  Vicodin  Home Medications   Prior to Admission medications   Medication Sig Start Date End Date Taking? Authorizing Provider  aspirin 81 MG tablet Take 81 mg by mouth daily.   Yes Historical Provider, MD  amoxicillin (AMOXIL) 500 MG capsule Take 1 capsule (500 mg total) by mouth 3 (three) times daily. Patient not taking: Reported on 01/19/2015 01/04/14   Lajean Saver, MD  azithromycin (ZITHROMAX Z-PAK) 250 MG tablet Take as directed Patient not taking: Reported on 01/19/2015 01/04/14   Lajean Saver, MD  ibuprofen (ADVIL,MOTRIN) 600 MG tablet Take 1 tablet (600 mg total) by mouth every 6 (six) hours as needed.  01/19/15   Virgel Manifold, MD  oxyCODONE-acetaminophen (PERCOCET/ROXICET) 5-325 MG tablet Take 1 tablet by mouth every 4 (four) hours as needed for severe pain. 01/19/15   Virgel Manifold, MD  predniSONE (DELTASONE) 10 MG tablet Take 2 tablets (20 mg total) by mouth daily. 2 tablets for 5 days, then 1.5 tablets for 3d, 1 tab for 3d, 0.5 tabs for 3 days 01/19/15   Virgel Manifold, MD  traMADol (ULTRAM) 50 MG tablet Take 1 tablet (50 mg total) by mouth every 6 (six) hours as needed. Patient not taking: Reported on 01/19/2015 01/04/14   Lajean Saver, MD  valACYclovir (VALTREX) 1000 MG tablet Take 1 tablet (1,000 mg total) by mouth 3 (three) times daily. 01/19/15 02/02/15  Virgel Manifold, MD   BP 175/100 mmHg  Pulse 105  Temp(Src) 98.5 F (36.9 C) (Oral)  Resp 18  Ht 5\' 10"  (1.778 m)  Wt 160 lb (72.576 kg)  BMI 22.96 kg/m2  SpO2 97% Physical Exam  Constitutional: He appears well-developed and well-nourished. No distress.  HENT:  Head: Normocephalic and atraumatic.  Eyes: Conjunctivae are normal. Right eye exhibits no discharge. Left eye exhibits no discharge.  Neck: Neck supple.  Cardiovascular: Normal rate, regular rhythm and normal heart sounds.  Exam reveals no gallop and no friction rub.   No murmur heard. Pulmonary/Chest: Effort normal and breath sounds normal.  No respiratory distress.  Abdominal: Soft. He exhibits no distension. There is no tenderness.  Musculoskeletal: He exhibits no edema or tenderness.  Neurological: He is alert.  Skin: Skin is warm. Rash noted.     Rash consistent with herpes zoster in R ~C5 distribution  Psychiatric: He has a normal mood and affect. His behavior is normal. Thought content normal.  Nursing note and vitals reviewed.   ED Course  Procedures (including critical care time) Labs Review Labs Reviewed - No data to display  Imaging Review No results found. I have personally reviewed and evaluated these images and lab results as part of my medical  decision-making.   EKG Interpretation None      MDM   Final diagnoses:  Shingles    55 year old male with characteristic rash shingles R ~C5 distribution. Antivirals, prednisone, pain medicine.    Virgel Manifold, MD 01/19/15 [phone]

## 2015-01-19 NOTE — Discharge Instructions (Signed)

## 2016-07-23 ENCOUNTER — Emergency Department (HOSPITAL_COMMUNITY)
Admission: EM | Admit: 2016-07-23 | Discharge: 2016-07-23 | Disposition: A | Discharge status: Home / Self Care | Payer: Self-pay | Attending: Emergency Medicine | Admitting: Emergency Medicine

## 2016-07-23 ENCOUNTER — Encounter (HOSPITAL_COMMUNITY): Payer: Self-pay | Admitting: Emergency Medicine

## 2016-07-23 DIAGNOSIS — R110 Nausea: Secondary | ICD-10-CM | POA: Insufficient documentation

## 2016-07-23 DIAGNOSIS — F17200 Nicotine dependence, unspecified, uncomplicated: Secondary | ICD-10-CM | POA: Insufficient documentation

## 2016-07-23 DIAGNOSIS — Z7982 Long term (current) use of aspirin: Secondary | ICD-10-CM | POA: Insufficient documentation

## 2016-07-23 LAB — COMPREHENSIVE METABOLIC PANEL
ALK PHOS: 98 U/L (ref 38–126)
ALT: 32 U/L (ref 17–63)
AST: 39 U/L (ref 15–41)
Albumin: 3.4 g/dL — ABNORMAL LOW (ref 3.5–5.0)
Anion gap: 9 (ref 5–15)
BUN: 12 mg/dL (ref 6–20)
CALCIUM: 9 mg/dL (ref 8.9–10.3)
CO2: 27 mmol/L (ref 22–32)
CREATININE: 0.77 mg/dL (ref 0.61–1.24)
Chloride: 100 mmol/L — ABNORMAL LOW (ref 101–111)
Glucose, Bld: 104 mg/dL — ABNORMAL HIGH (ref 65–99)
Potassium: 3.9 mmol/L (ref 3.5–5.1)
Sodium: 136 mmol/L (ref 135–145)
Total Bilirubin: 0.9 mg/dL (ref 0.3–1.2)
Total Protein: 9.4 g/dL — ABNORMAL HIGH (ref 6.5–8.1)

## 2016-07-23 LAB — CBC
HCT: 53.1 % — ABNORMAL HIGH (ref 39.0–52.0)
Hemoglobin: 19 g/dL — ABNORMAL HIGH (ref 13.0–17.0)
MCH: 35.3 pg — AB (ref 26.0–34.0)
MCHC: 35.8 g/dL (ref 30.0–36.0)
MCV: 98.5 fL (ref 78.0–100.0)
PLATELETS: 106 10*3/uL — AB (ref 150–400)
RBC: 5.39 MIL/uL (ref 4.22–5.81)
RDW: 14.2 % (ref 11.5–15.5)
WBC: 3.4 10*3/uL — AB (ref 4.0–10.5)

## 2016-07-23 LAB — URINALYSIS, ROUTINE W REFLEX MICROSCOPIC
Bilirubin Urine: NEGATIVE
Glucose, UA: NEGATIVE mg/dL
Ketones, ur: NEGATIVE mg/dL
Leukocytes, UA: NEGATIVE
Nitrite: NEGATIVE
Protein, ur: NEGATIVE mg/dL
Specific Gravity, Urine: 1.021 (ref 1.005–1.030)
Squamous Epithelial / LPF: NONE SEEN
pH: 6 (ref 5.0–8.0)

## 2016-07-23 LAB — DIFFERENTIAL
BASOS PCT: 2 %
Basophils Absolute: 0.1 10*3/uL (ref 0.0–0.1)
Eosinophils Absolute: 0.1 10*3/uL (ref 0.0–0.7)
Eosinophils Relative: 3 %
Lymphocytes Relative: 34 %
Lymphs Abs: 1.2 10*3/uL (ref 0.7–4.0)
Monocytes Absolute: 0.6 10*3/uL (ref 0.1–1.0)
Monocytes Relative: 19 %
Neutro Abs: 1.4 10*3/uL — ABNORMAL LOW (ref 1.7–7.7)
Neutrophils Relative %: 42 %

## 2016-07-23 LAB — LIPASE, BLOOD: Lipase: 31 U/L (ref 11–51)

## 2016-07-23 MED ORDER — SODIUM CHLORIDE 0.9 % IV BOLUS (SEPSIS)
1000.0000 mL | Freq: Once | INTRAVENOUS | Status: AC
  Administered 2016-07-23: 1000 mL via INTRAVENOUS

## 2016-07-23 NOTE — Discharge Instructions (Signed)
Please read instructions below. Drink plenty of water. Schedule an appointment with your primary care provider to follow up if symptoms persist. Return to the ER for new abdominal pain, if you stop having bowel movements, or for new or concerning symptoms.

## 2016-07-23 NOTE — ED Triage Notes (Signed)
Patient reports starting on Sunday was vomiting all day and into the night then yesterday and today just has been nauseated and fatigued. Patient reports he is able to tolerate PO foods and fluids.  Also reports that he had PNA before and feels similar. Patient denies any SOB and reports has same chronic cough due to smoking.

## 2016-07-23 NOTE — ED Provider Notes (Signed)
[facility] DEPT Provider Note   CSN: [id number] Arrival date & time: 07/23/16  0867     History   Chief Complaint Chief Complaint  Patient presents with  . Nausea  . Fatigue    HPI Calvin Schroeder is a 56 y.o. male past medical history of appendectomy and chronic back pain.  HPI  Patient presents with nausea and worsening persistent fatigue that began on Sunday. Patient states on Sunday he began vomiting with associated nausea. Vomiting resolved on Monday, patient states he was feeling well on Monday with very little nausea, however woke up today feeling very weak. Reports associated subjective fever and chills. Patient denies associated abdominal pain, chest pain, shortness of breath, cough, cold symptoms, hematochezia or melena. Reports his last bowel movement was this morning and normal consistency.   Past Medical History:  Diagnosis Date  . Back pain   . Lung mass 06/10/2013  . Tobacco abuse 06/10/2013    Patient Active Problem List   Diagnosis Date Noted  . Erythrocytosis 06/10/2013  . Lung mass 06/10/2013  . Tobacco abuse 06/10/2013    Past Surgical History:  Procedure Laterality Date  . APPENDECTOMY    . SHOULDER SURGERY         Home Medications    Prior to Admission medications   Medication Sig Start Date End Date Taking? Authorizing Provider  aspirin 81 MG tablet Take 81 mg by mouth every evening.    Yes [provider]  Multiple Vitamin (MULTIVITAMIN WITH MINERALS) TABS tablet Take 1 tablet by mouth every evening.    Yes [provider]    Family History Family History  Problem Relation Age of Onset  . Cancer Mother        lung ca  . Cancer Father        skin ca  . Cancer Maternal Uncle        liver ca  . Cancer Maternal Grandmother        bladder ca    Social History Social History  Substance Use Topics  . Smoking status: Current Every Day Smoker    Packs/day: 1.50    Years: 34.00  . Smokeless tobacco: Never Used    . Alcohol use Yes     Comment: daily - 4-5 alcohol     Allergies   Vicodin [hydrocodone-acetaminophen]   Review of Systems Review of Systems  Constitutional: Positive for chills, fatigue and fever. Negative for appetite change.  HENT: Negative for congestion and sore throat.   Respiratory: Negative for cough and shortness of breath.   Cardiovascular: Negative for chest pain.  Gastrointestinal: Positive for nausea and vomiting. Negative for abdominal pain, blood in stool, constipation and diarrhea.  Genitourinary: Negative for dysuria, frequency, scrotal swelling and testicular pain.     Physical Exam Updated Vital Signs BP (!) 150/77   Pulse 62   Temp 98 F (36.7 C) (Oral)   Resp 16   Ht 5\' 6"  (1.676 m)   Wt 70.3 kg (155 lb)   SpO2 100%   BMI 25.02 kg/m   Physical Exam  Constitutional: He appears well-developed and well-nourished.  HENT:  Head: Normocephalic and atraumatic.  Mouth/Throat: Oropharynx is clear and moist.  Eyes: Conjunctivae are normal.  Neck: Normal range of motion. Neck supple.  Cardiovascular: Normal rate, regular rhythm, normal heart sounds and intact distal pulses.  Exam reveals no friction rub.   No murmur heard. Pulmonary/Chest: Effort normal and breath sounds normal. No respiratory distress. He has  no wheezes. He has no rales.  Abdominal: Soft. Normal appearance and bowel sounds are normal. He exhibits no distension and no mass. There is no tenderness. There is no rigidity, no rebound, no guarding and no CVA tenderness. No hernia.  Neurological: He is alert.  Skin: Skin is warm.  Psychiatric: He has a normal mood and affect. His behavior is normal.  Nursing note and vitals reviewed.    ED Treatments / Results  Labs (all labs ordered are listed, but only abnormal results are displayed) Labs Reviewed  COMPREHENSIVE METABOLIC PANEL - Abnormal; Notable for the following:       Result Value   Chloride 100 (*)    Glucose, Bld 104 (*)     Total Protein 9.4 (*)    Albumin 3.4 (*)    All other components within normal limits  CBC - Abnormal; Notable for the following:    WBC 3.4 (*)    Hemoglobin 19.0 (*)    HCT 53.1 (*)    MCH 35.3 (*)    Platelets 106 (*)    All other components within normal limits  URINALYSIS, ROUTINE W REFLEX MICROSCOPIC - Abnormal; Notable for the following:    Color, Urine AMBER (*)    Hgb urine dipstick SMALL (*)    Bacteria, UA FEW (*)    All other components within normal limits  DIFFERENTIAL - Abnormal; Notable for the following:    Neutro Abs 1.4 (*)    All other components within normal limits  LIPASE, BLOOD    EKG  EKG Interpretation None       Radiology No results found.  Procedures Procedures (including critical care time)  Medications Ordered in ED Medications  sodium chloride 0.9 % bolus 1,000 mL (0 mLs Intravenous Stopped 07/23/16 1205)     Initial Impression / Assessment and Plan / ED Course  I have reviewed the triage vital signs and the nursing notes.  Pertinent labs & imaging results that were available during my care of the patient were reviewed by me and considered in my medical decision making (see chart for details).    Pt nausea and fatigue after vomiting on Sunday. Patient is nontoxic, nonseptic appearing, in no apparent distress.  Patient without abd pain or nausea in ED.  Labs and vitals reviewed. Labs consistent with mild dehydration.  Fluid bolus given with improvement in symptoms. Patient does not meet the SIRS or Sepsis criteria.    12:15pm - on re-eval, pt with improvement in symptoms. Abd remains nontender. Pt denies nausea, and is ready to leave. Patient does not have a surgical abdomen and there are no peritoneal signs.  No indication of appendicitis, bowel obstruction, bowel perforation, cholecystitis, diverticulitis.  Patient discharged home with symptomatic treatment and given strict instructions for follow-up with their primary care physician.  Pt  safe for discharge.  Patient discussed with and seen by Dr. Lacinda Axon.  Discussed results, findings, treatment and follow up. Patient advised of return precautions. Patient verbalized understanding and agreed with plan.  Final Clinical Impressions(s) / ED Diagnoses   Final diagnoses:  Nausea    New Prescriptions Discharge Medication List as of 07/23/2016 12:17 PM       Russo, [place] N, PA-C 07/23/16 1629    Nat Christen, MD 07/24/16 1310

## 2016-11-24 ENCOUNTER — Emergency Department (HOSPITAL_COMMUNITY): Payer: Self-pay

## 2016-11-24 ENCOUNTER — Emergency Department (HOSPITAL_COMMUNITY)
Admission: EM | Admit: 2016-11-24 | Discharge: 2016-11-24 | Disposition: A | Discharge status: Home / Self Care | Payer: Self-pay | Attending: Emergency Medicine | Admitting: Emergency Medicine

## 2016-11-24 ENCOUNTER — Other Ambulatory Visit: Payer: Self-pay

## 2016-11-24 DIAGNOSIS — E860 Dehydration: Secondary | ICD-10-CM

## 2016-11-24 DIAGNOSIS — F17210 Nicotine dependence, cigarettes, uncomplicated: Secondary | ICD-10-CM | POA: Insufficient documentation

## 2016-11-24 DIAGNOSIS — Z79899 Other long term (current) drug therapy: Secondary | ICD-10-CM | POA: Insufficient documentation

## 2016-11-24 DIAGNOSIS — R918 Other nonspecific abnormal finding of lung field: Secondary | ICD-10-CM | POA: Insufficient documentation

## 2016-11-24 DIAGNOSIS — J189 Pneumonia, unspecified organism: Secondary | ICD-10-CM

## 2016-11-24 DIAGNOSIS — Z7982 Long term (current) use of aspirin: Secondary | ICD-10-CM | POA: Insufficient documentation

## 2016-11-24 DIAGNOSIS — J181 Lobar pneumonia, unspecified organism: Secondary | ICD-10-CM | POA: Insufficient documentation

## 2016-11-24 DIAGNOSIS — R55 Syncope and collapse: Secondary | ICD-10-CM | POA: Insufficient documentation

## 2016-11-24 DIAGNOSIS — R7989 Other specified abnormal findings of blood chemistry: Secondary | ICD-10-CM

## 2016-11-24 DIAGNOSIS — R945 Abnormal results of liver function studies: Secondary | ICD-10-CM | POA: Insufficient documentation

## 2016-11-24 LAB — COMPREHENSIVE METABOLIC PANEL
ALBUMIN: 2.5 g/dL — AB (ref 3.5–5.0)
ALT: 124 U/L — AB (ref 17–63)
AST: 73 U/L — AB (ref 15–41)
Alkaline Phosphatase: 80 U/L (ref 38–126)
Anion gap: 4 — ABNORMAL LOW (ref 5–15)
BILIRUBIN TOTAL: 0.3 mg/dL (ref 0.3–1.2)
BUN: 12 mg/dL (ref 6–20)
CHLORIDE: 106 mmol/L (ref 101–111)
CO2: 24 mmol/L (ref 22–32)
CREATININE: 0.56 mg/dL — AB (ref 0.61–1.24)
Calcium: 7.9 mg/dL — ABNORMAL LOW (ref 8.9–10.3)
GFR calc Af Amer: >60 mL/min (ref >60)
GLUCOSE: 146 mg/dL — AB (ref 65–99)
Potassium: 4.1 mmol/L (ref 3.5–5.1)
Sodium: 134 mmol/L — ABNORMAL LOW (ref 135–145)
Total Protein: 7.3 g/dL (ref 6.5–8.1)

## 2016-11-24 LAB — RAPID URINE DRUG SCREEN, HOSP PERFORMED
Amphetamines: NOT DETECTED
Barbiturates: NOT DETECTED
Benzodiazepines: NOT DETECTED
Cocaine: NOT DETECTED
OPIATES: NOT DETECTED
Tetrahydrocannabinol: POSITIVE — AB

## 2016-11-24 LAB — CBC WITH DIFFERENTIAL/PLATELET
BASOS PCT: 0 %
Basophils Absolute: 0 10*3/uL (ref 0.0–0.1)
EOS ABS: 0.1 10*3/uL (ref 0.0–0.7)
Eosinophils Relative: 2 %
HEMATOCRIT: 43 % (ref 39.0–52.0)
HEMOGLOBIN: 15.2 g/dL (ref 13.0–17.0)
LYMPHS ABS: 0.7 10*3/uL (ref 0.7–4.0)
Lymphocytes Relative: 15 %
MCH: 34.9 pg — ABNORMAL HIGH (ref 26.0–34.0)
MCHC: 35.3 g/dL (ref 30.0–36.0)
MCV: 98.9 fL (ref 78.0–100.0)
Monocytes Absolute: 0.6 10*3/uL (ref 0.1–1.0)
Monocytes Relative: 13 %
NEUTROS ABS: 3.3 10*3/uL (ref 1.7–7.7)
NEUTROS PCT: 70 %
Platelets: 88 10*3/uL — ABNORMAL LOW (ref 150–400)
RBC: 4.35 MIL/uL (ref 4.22–5.81)
RDW: 12.8 % (ref 11.5–15.5)
WBC: 4.7 10*3/uL (ref 4.0–10.5)

## 2016-11-24 LAB — URINALYSIS, ROUTINE W REFLEX MICROSCOPIC
BILIRUBIN URINE: NEGATIVE
Glucose, UA: NEGATIVE mg/dL
HGB URINE DIPSTICK: NEGATIVE
Ketones, ur: NEGATIVE mg/dL
Leukocytes, UA: NEGATIVE
NITRITE: NEGATIVE
PROTEIN: NEGATIVE mg/dL
SPECIFIC GRAVITY, URINE: 1.005 (ref 1.005–1.030)
pH: 6 (ref 5.0–8.0)

## 2016-11-24 LAB — LIPASE, BLOOD: LIPASE: 35 U/L (ref 11–51)

## 2016-11-24 LAB — I-STAT CHEM 8, ED
BUN: 12 mg/dL (ref 6–20)
CREATININE: 0.5 mg/dL — AB (ref 0.61–1.24)
Calcium, Ion: 1.07 mmol/L — ABNORMAL LOW (ref 1.15–1.40)
Chloride: 105 mmol/L (ref 101–111)
Glucose, Bld: 149 mg/dL — ABNORMAL HIGH (ref 65–99)
HEMATOCRIT: 41 % (ref 39.0–52.0)
HEMOGLOBIN: 13.9 g/dL (ref 13.0–17.0)
POTASSIUM: 4 mmol/L (ref 3.5–5.1)
SODIUM: 140 mmol/L (ref 135–145)
TCO2: 27 mmol/L (ref 22–32)

## 2016-11-24 MED ORDER — LEVOFLOXACIN 500 MG PO TABS
500.0000 mg | ORAL_TABLET | Freq: Once | ORAL | Status: AC
  Administered 2016-11-24: 500 mg via ORAL
  Filled 2016-11-24: qty 1

## 2016-11-24 MED ORDER — ONDANSETRON HCL 4 MG/2ML IJ SOLN: 4.0000 mg | Freq: Once | INTRAMUSCULAR | Status: DC

## 2016-11-24 MED ORDER — METOCLOPRAMIDE HCL 10 MG PO TABS: 10.0000 mg | ORAL_TABLET | Freq: Four times a day (QID) | ORAL | 0 refills | Status: DC | PRN

## 2016-11-24 MED ORDER — PROMETHAZINE HCL 25 MG/ML IJ SOLN
25.0000 mg | Freq: Once | INTRAMUSCULAR | Status: AC
  Administered 2016-11-24: 25 mg via INTRAVENOUS
  Filled 2016-11-24: qty 1

## 2016-11-24 MED ORDER — LEVOFLOXACIN 750 MG PO TABS: 750.0000 mg | ORAL_TABLET | Freq: Every day | ORAL | 0 refills | Status: DC

## 2016-11-24 MED ORDER — SODIUM CHLORIDE 0.9 % IV BOLUS (SEPSIS)
1000.0000 mL | Freq: Once | INTRAVENOUS | Status: AC
  Administered 2016-11-24: 1000 mL via INTRAVENOUS

## 2016-11-24 MED ORDER — IOPAMIDOL (ISOVUE-300) INJECTION 61%
INTRAVENOUS | Status: AC
  Filled 2016-11-24: qty 100

## 2016-11-24 MED ORDER — IOPAMIDOL (ISOVUE-370) INJECTION 76%
100.0000 mL | Freq: Once | INTRAVENOUS | Status: AC | PRN
  Administered 2016-11-24: 100 mL via INTRAVENOUS

## 2016-11-24 NOTE — Discharge Instructions (Signed)
Take levaquin daily for a week for pneumonia.   Take reglan as needed for nausea.   Stay hydrated.   You may have a lung mass. See oncologist for follow up.  Your liver functions is slightly elevated likely from the viral infection. Avoid drinking alcohol. Repeat liver function test with your doctor in a week   Return to ER if you have worse dizziness, passing out, vomiting, abdominal pain, trouble breathing, fever

## 2016-11-24 NOTE — ED Triage Notes (Signed)
Per pt report. Pt was on a cruise and began to have loose stools while on away. Pt denies any loose stools since returning. Pt admits to using marijuana earlier today and having vomiting once with nausea since. Pt denies any other family members being ill.

## 2016-11-24 NOTE — ED Provider Notes (Signed)
[facility] DEPT Provider Note   CSN: [id number] Arrival date & time: 11/24/16  4098     History   Chief Complaint Chief Complaint  Patient presents with  . Dizziness    HPI Calvin Schroeder is a 56 y.o. male hx of alcohol and marijuana use, here with vomiting, near syncope. Patient states that he went to a cruise to the [place] and came home last week. On the last day of the cruise, he noticed that he was getting sick and was nauseated and had several episodes of vomiting. His symptoms improved for the last week until yesterday. He had more vomiting yesterday and loose stools. Denies vomiting or abdominal pain. Patient felt light headed and dizzy this afternoon and felt like he was going to pass out so had to sit down. EMS was called and he was given IVF and sent for evaluation.  He uses marijuana chronically and hasn't been drinking alcohol for the past week.   The history is provided by the patient.    Past Medical History:  Diagnosis Date  . Back pain   . Lung mass 06/10/2013  . Tobacco abuse 06/10/2013    Patient Active Problem List   Diagnosis Date Noted  . Erythrocytosis 06/10/2013  . Lung mass 06/10/2013  . Tobacco abuse 06/10/2013    Past Surgical History:  Procedure Laterality Date  . APPENDECTOMY    . SHOULDER SURGERY         Home Medications    Prior to Admission medications   Medication Sig Start Date End Date Taking? Authorizing Provider  aspirin 81 MG tablet Take 81 mg by mouth every evening.    Yes [provider]  Multiple Vitamin (MULTIVITAMIN WITH MINERALS) TABS tablet Take 1 tablet by mouth every evening.    Yes [provider]    Family History Family History  Problem Relation Age of Onset  . Cancer Mother        lung ca  . Cancer Father        skin ca  . Cancer Maternal Uncle        liver ca  . Cancer Maternal Grandmother        bladder ca    Social History Social History    Tobacco Use  . Smoking status: Current Every Day Smoker    Packs/day: 1.50    Years: 34.00    Pack years: 51.00  . Smokeless tobacco: Never Used  Substance Use Topics  . Alcohol use: Yes    Comment: daily - 4-5 alcohol  . Drug use: Yes    Types: Marijuana     Allergies   Vicodin [hydrocodone-acetaminophen]   Review of Systems Review of Systems  Gastrointestinal: Positive for vomiting.  All other systems reviewed and are negative.    Physical Exam Updated Vital Signs BP 116/74   Pulse (!) 59   Temp 98.2 F (36.8 C) (Oral)   Resp 14   Ht 5\' 9"  (1.753 m)   Wt 56.7 kg (125 lb)   SpO2 97%   BMI 18.46 kg/m   Physical Exam  Constitutional: He is oriented to person, place, and time. He appears well-developed.  HENT:  Head: Normocephalic.  MM dry   Eyes: Conjunctivae and EOM are normal. Pupils are equal, round, and reactive to light.  Neck: Normal range of motion. Neck supple.  Cardiovascular: Normal rate, regular rhythm and normal heart sounds.  Pulmonary/Chest: Effort normal and breath sounds normal. No stridor.  No respiratory distress. He has no wheezes.  Abdominal: Soft. Bowel sounds are normal. He exhibits no distension. There is no tenderness. There is no guarding.  Musculoskeletal: Normal range of motion.  Neurological: He is alert and oriented to person, place, and time. No cranial nerve deficit. Coordination normal.  Skin: Skin is warm.  Psychiatric: He has a normal mood and affect.  Nursing note and vitals reviewed.    ED Treatments / Results  Labs (all labs ordered are listed, but only abnormal results are displayed) Labs Reviewed  CBC WITH DIFFERENTIAL/PLATELET - Abnormal; Notable for the following components:      Result Value   MCH 34.9 (*)    Platelets 88 (*)    All other components within normal limits  RAPID URINE DRUG SCREEN, HOSP PERFORMED - Abnormal; Notable for the following components:   Tetrahydrocannabinol POSITIVE (*)    All other  components within normal limits  COMPREHENSIVE METABOLIC PANEL - Abnormal; Notable for the following components:   Sodium 134 (*)    Glucose, Bld 146 (*)    Creatinine, Ser 0.56 (*)    Calcium 7.9 (*)    Albumin 2.5 (*)    AST 73 (*)    ALT 124 (*)    Anion gap 4 (*)    All other components within normal limits  I-STAT CHEM 8, ED - Abnormal; Notable for the following components:   Creatinine, Ser 0.50 (*)    Glucose, Bld 149 (*)    Calcium, Ion 1.07 (*)    All other components within normal limits  URINALYSIS, ROUTINE W REFLEX MICROSCOPIC  LIPASE, BLOOD  ETHANOL  I-STAT TROPONIN, ED  I-STAT CG4 LACTIC ACID, ED  I-STAT CHEM 8, ED    EKG  EKG Interpretation  Date/Time:  Sunday November 24 2016 16:12:41 EST Ventricular Rate:  80 PR Interval:    QRS Duration: 98 QT Interval:  368 QTC Calculation: 425 R Axis:   59 Text Interpretation:  Sinus rhythm Probable left atrial enlargement Nonspecific T abnrm, anterolateral leads No significant change since last tracing Confirmed by Wandra Arthurs [phone]) on 11/24/2016 4:21:07 PM       Radiology Dg Chest 2 View  Result Date: 11/24/2016 CLINICAL DATA:  Recent diarrhea and weight loss EXAM: CHEST  2 VIEW COMPARISON:  02/05/2014 FINDINGS: Cardiac shadow is within normal limits. The lungs are hyperinflated bilaterally. Patchy infiltrative changes are noted in the left upper lobe. Some chronic changes in the right mid lung are seen. No acute bony abnormality is noted. IMPRESSION: Patchy infiltrative changes in the left upper lobe. Followup PA and lateral chest X-ray is recommended in 3-4 weeks following trial of antibiotic therapy to ensure resolution and exclude underlying malignancy. Electronically Signed   By: Inez Catalina M.D.   On: 11/24/2016 17:06   Dg Abdomen 1 View  Result Date: 11/24/2016 CLINICAL DATA:  Vomiting and weight loss, diarrhea EXAM: ABDOMEN - 1 VIEW COMPARISON:  None. FINDINGS: Scattered large and small bowel gas is  noted. No obstructive changes are seen. The bony structures are within normal limits. No free air is noted. No other focal abnormality is seen. IMPRESSION: No acute abnormality noted. Electronically Signed   By: Inez Catalina M.D.   On: 11/24/2016 17:07   Ct Angio Chest Pe W And/or Wo Contrast  Result Date: 11/24/2016 CLINICAL DATA:  56 year old male with concern for pulmonary embolism. EXAM: CT ANGIOGRAPHY CHEST CT ABDOMEN AND PELVIS WITH CONTRAST TECHNIQUE: Multidetector CT imaging of the chest  was performed using the standard protocol during bolus administration of intravenous contrast. Multiplanar CT image reconstructions and MIPs were obtained to evaluate the vascular anatomy. Multidetector CT imaging of the abdomen and pelvis was performed using the standard protocol during bolus administration of intravenous contrast. CONTRAST:  173mL ISOVUE-370 IOPAMIDOL (ISOVUE-370) INJECTION 76% COMPARISON:  Abdominal radiograph dated 11/24/2016 and chest radiograph dated 11/24/2016 FINDINGS: CTA CHEST FINDINGS Cardiovascular: There is no cardiomegaly. Probable trace pericardial effusion. The thoracic aorta appears unremarkable. There is no CT evidence of pulmonary embolism. Mediastinum/Nodes: Bilateral hilar adenopathy measure up to 11 mm on the right. Multiple top-normal mediastinal lymph nodes measure approximately 10 mm in the AP window. There is no mediastinal fluid collection. Lungs/Pleura: There is severe centrilobular emphysema. An area of consolidative change in the left upper lobe extending from the pleural surface to the suprahilar region most consistent with pneumonia. A mass is not excluded. Clinical correlation and follow-up to resolution after treatment and resolution of acute symptoms recommended. A cystic spaces within this consolidative change likely represents loss of parenchyma due to underlying emphysema and less likely a cavitary lesion. There is no pleural effusion or pneumothorax. The central  airways are patent. Musculoskeletal: There is loss of subcutaneous fat. No acute osseous pathology. Review of the MIP images confirms the above findings. CT ABDOMEN and PELVIS FINDINGS No intra-abdominal free air.  Small free fluid within the pelvis. Hepatobiliary: No focal liver abnormality is seen. No gallstones, gallbladder wall thickening, or biliary dilatation. Pancreas: Unremarkable. No pancreatic ductal dilatation or surrounding inflammatory changes. Spleen: Normal in size without focal abnormality. Adrenals/Urinary Tract: Adrenal glands are unremarkable. Kidneys are normal, without renal calculi, focal lesion, or hydronephrosis. Bladder is unremarkable. Stomach/Bowel: There is no evidence of bowel obstruction or active inflammation. Appendectomy. Vascular/Lymphatic: There is moderate aortoiliac atherosclerotic disease. The origins of the celiac axis, SMA, IMA and the renal arteries are patent. The SMV, splenic vein, and main portal vein are patent. No portal venous gas. There are mildly enlarged retroperitoneal lymph nodes of indeterminate etiology. A somewhat rounded lymph node along the left external iliac chain measures 13 mm. Clinical correlation is recommended. Reproductive: The prostate and seminal vesicles are grossly unremarkable as visualized. Other: Loss of subcutaneous and mesenteric fat. Musculoskeletal: No acute or significant osseous findings. Review of the MIP images confirms the above findings. IMPRESSION: 1. Severe centrilobular emphysema. An area of consolidative change in the left upper lobe likely pneumonia. Underlying mass/neoplasm is not excluded. Correlation with clinical exam and follow-up after resolution of acute symptoms recommended. 2. No CT evidence of pulmonary embolism. 3. Mildly enlarged hilar common retroperitoneal, and iliac lymph nodes of indeterminate etiology. Clinical correlation is recommended. 4. Aortic Atherosclerosis (ICD10-I70.0) and Emphysema (ICD10-J43.9).  Electronically Signed   By: Anner Crete M.D.   On: 11/24/2016 22:03   Ct Abdomen Pelvis W Contrast  Result Date: 11/24/2016 CLINICAL DATA:  56 year old male with concern for pulmonary embolism. EXAM: CT ANGIOGRAPHY CHEST CT ABDOMEN AND PELVIS WITH CONTRAST TECHNIQUE: Multidetector CT imaging of the chest was performed using the standard protocol during bolus administration of intravenous contrast. Multiplanar CT image reconstructions and MIPs were obtained to evaluate the vascular anatomy. Multidetector CT imaging of the abdomen and pelvis was performed using the standard protocol during bolus administration of intravenous contrast. CONTRAST:  161mL ISOVUE-370 IOPAMIDOL (ISOVUE-370) INJECTION 76% COMPARISON:  Abdominal radiograph dated 11/24/2016 and chest radiograph dated 11/24/2016 FINDINGS: CTA CHEST FINDINGS Cardiovascular: There is no cardiomegaly. Probable trace pericardial effusion. The thoracic aorta appears unremarkable.  There is no CT evidence of pulmonary embolism. Mediastinum/Nodes: Bilateral hilar adenopathy measure up to 11 mm on the right. Multiple top-normal mediastinal lymph nodes measure approximately 10 mm in the AP window. There is no mediastinal fluid collection. Lungs/Pleura: There is severe centrilobular emphysema. An area of consolidative change in the left upper lobe extending from the pleural surface to the suprahilar region most consistent with pneumonia. A mass is not excluded. Clinical correlation and follow-up to resolution after treatment and resolution of acute symptoms recommended. A cystic spaces within this consolidative change likely represents loss of parenchyma due to underlying emphysema and less likely a cavitary lesion. There is no pleural effusion or pneumothorax. The central airways are patent. Musculoskeletal: There is loss of subcutaneous fat. No acute osseous pathology. Review of the MIP images confirms the above findings. CT ABDOMEN and PELVIS FINDINGS No  intra-abdominal free air.  Small free fluid within the pelvis. Hepatobiliary: No focal liver abnormality is seen. No gallstones, gallbladder wall thickening, or biliary dilatation. Pancreas: Unremarkable. No pancreatic ductal dilatation or surrounding inflammatory changes. Spleen: Normal in size without focal abnormality. Adrenals/Urinary Tract: Adrenal glands are unremarkable. Kidneys are normal, without renal calculi, focal lesion, or hydronephrosis. Bladder is unremarkable. Stomach/Bowel: There is no evidence of bowel obstruction or active inflammation. Appendectomy. Vascular/Lymphatic: There is moderate aortoiliac atherosclerotic disease. The origins of the celiac axis, SMA, IMA and the renal arteries are patent. The SMV, splenic vein, and main portal vein are patent. No portal venous gas. There are mildly enlarged retroperitoneal lymph nodes of indeterminate etiology. A somewhat rounded lymph node along the left external iliac chain measures 13 mm. Clinical correlation is recommended. Reproductive: The prostate and seminal vesicles are grossly unremarkable as visualized. Other: Loss of subcutaneous and mesenteric fat. Musculoskeletal: No acute or significant osseous findings. Review of the MIP images confirms the above findings. IMPRESSION: 1. Severe centrilobular emphysema. An area of consolidative change in the left upper lobe likely pneumonia. Underlying mass/neoplasm is not excluded. Correlation with clinical exam and follow-up after resolution of acute symptoms recommended. 2. No CT evidence of pulmonary embolism. 3. Mildly enlarged hilar common retroperitoneal, and iliac lymph nodes of indeterminate etiology. Clinical correlation is recommended. 4. Aortic Atherosclerosis (ICD10-I70.0) and Emphysema (ICD10-J43.9). Electronically Signed   By: Anner Crete M.D.   On: 11/24/2016 22:03    Procedures Procedures (including critical care time)  Medications Ordered in ED Medications  iopamidol  (ISOVUE-300) 61 % injection (not administered)  levofloxacin (LEVAQUIN) tablet 500 mg (not administered)  sodium chloride 0.9 % bolus 1,000 mL (0 mLs Intravenous Stopped 11/24/16 1743)  promethazine (PHENERGAN) injection 25 mg (25 mg Intravenous Given 11/24/16 1744)  sodium chloride 0.9 % bolus 1,000 mL (1,000 mLs Intravenous New Bag/Given 11/24/16 1930)  iopamidol (ISOVUE-370) 76 % injection 100 mL (100 mLs Intravenous Contrast Given 11/24/16 2056)     Initial Impression / Assessment and Plan / ED Course  I have reviewed the triage vital signs and the nursing notes.  Pertinent labs & imaging results that were available during my care of the patient were reviewed by me and considered in my medical decision making (see chart for details).     Calvin Schroeder is a 56 y.o. male here with near syncope, vomiting. I think likely orthostasis from mild gastro. Abdomen nontender. Will get labs, UA, orthostatics. Will hydrate and reassess.   10:17 PM Labs showed minimally elevated LFTs. Has some weight loss so I got CXR and xray abdomen. CXR showed possible mass vs  pneumonia. CT chest/ab/pel L upper lobe pneumonia with possible mass and enlarged hilar lymph nodes. No mets in the abdomen. I think his symptoms likely from gastro on the cruise ship vs pneumonia. Possible to have lung mass and pneumonia as well. Will dc home with levaquin, zofran. Will refer to oncology for follow up.   Final Clinical Impressions(s) / ED Diagnoses   Final diagnoses:  None    ED Discharge Orders    None       Drenda Freeze, MD 11/24/16 2221

## 2016-11-25 LAB — I-STAT TROPONIN, ED: Troponin i, poc: 0 ng/mL (ref 0.00–0.08)

## 2016-11-25 LAB — I-STAT CG4 LACTIC ACID, ED: Lactic Acid, Venous: 1.88 mmol/L (ref 0.5–1.9)

## 2016-11-26 ENCOUNTER — Telehealth: Payer: Self-pay | Admitting: *Deleted

## 2016-11-26 DIAGNOSIS — R918 Other nonspecific abnormal finding of lung field: Secondary | ICD-10-CM

## 2016-11-26 NOTE — Telephone Encounter (Signed)
Oncology Nurse Navigator Documentation  Oncology Nurse Navigator Flowsheets 11/26/2016  Navigator Location [facility]  Referral date to RadOnc/MedOnc 11/26/2016  Navigator Encounter Type Telephone/I received a call from patient's wife. She has referred him to be seen.  I called back with an appt and he verbalized understanding of appt time and place.   Telephone Outgoing Call  Treatment Phase Abnormal Scans  Barriers/Navigation Needs Coordination of Care  Interventions Coordination of Care  Coordination of Care Appts  Acuity Level 1  Time Spent with Patient 30

## 2016-12-10 ENCOUNTER — Ambulatory Visit (HOSPITAL_BASED_OUTPATIENT_CLINIC_OR_DEPARTMENT_OTHER): Payer: Self-pay | Admitting: Internal Medicine

## 2016-12-10 ENCOUNTER — Telehealth: Payer: Self-pay | Admitting: Internal Medicine

## 2016-12-10 ENCOUNTER — Encounter: Payer: Self-pay | Admitting: Internal Medicine

## 2016-12-10 ENCOUNTER — Other Ambulatory Visit (HOSPITAL_BASED_OUTPATIENT_CLINIC_OR_DEPARTMENT_OTHER): Payer: Self-pay

## 2016-12-10 DIAGNOSIS — F1010 Alcohol abuse, uncomplicated: Secondary | ICD-10-CM

## 2016-12-10 DIAGNOSIS — R918 Other nonspecific abnormal finding of lung field: Secondary | ICD-10-CM

## 2016-12-10 DIAGNOSIS — F17200 Nicotine dependence, unspecified, uncomplicated: Secondary | ICD-10-CM

## 2016-12-10 DIAGNOSIS — F1210 Cannabis abuse, uncomplicated: Secondary | ICD-10-CM

## 2016-12-10 DIAGNOSIS — Z8052 Family history of malignant neoplasm of bladder: Secondary | ICD-10-CM

## 2016-12-10 DIAGNOSIS — R591 Generalized enlarged lymph nodes: Secondary | ICD-10-CM

## 2016-12-10 DIAGNOSIS — Z808 Family history of malignant neoplasm of other organs or systems: Secondary | ICD-10-CM

## 2016-12-10 DIAGNOSIS — R911 Solitary pulmonary nodule: Secondary | ICD-10-CM

## 2016-12-10 DIAGNOSIS — Z801 Family history of malignant neoplasm of trachea, bronchus and lung: Secondary | ICD-10-CM

## 2016-12-10 LAB — CBC WITH DIFFERENTIAL/PLATELET
BASO%: 1 % (ref 0.0–2.0)
BASOS ABS: 0 10*3/uL (ref 0.0–0.1)
EOS ABS: 0.1 10*3/uL (ref 0.0–0.5)
EOS%: 5.2 % (ref 0.0–7.0)
HCT: 42.1 % (ref 38.4–49.9)
HEMOGLOBIN: 14.3 g/dL (ref 13.0–17.1)
LYMPH%: 26.7 % (ref 14.0–49.0)
MCH: 33.5 pg — AB (ref 27.2–33.4)
MCHC: 33.9 g/dL (ref 32.0–36.0)
MCV: 98.9 fL — AB (ref 79.3–98.0)
MONO#: 0.4 10*3/uL (ref 0.1–0.9)
MONO%: 15.9 % — AB (ref 0.0–14.0)
NEUT#: 1.4 10*3/uL — ABNORMAL LOW (ref 1.5–6.5)
NEUT%: 51.2 % (ref 39.0–75.0)
PLATELETS: 121 10*3/uL — AB (ref 140–400)
RBC: 4.26 10*6/uL (ref 4.20–5.82)
RDW: 12.6 % (ref 11.0–14.6)
WBC: 2.7 10*3/uL — ABNORMAL LOW (ref 4.0–10.3)
lymph#: 0.7 10*3/uL — ABNORMAL LOW (ref 0.9–3.3)

## 2016-12-10 LAB — COMPREHENSIVE METABOLIC PANEL
ALT: 31 U/L (ref 0–55)
AST: 26 U/L (ref 5–34)
Albumin: 3 g/dL — ABNORMAL LOW (ref 3.5–5.0)
Alkaline Phosphatase: 95 U/L (ref 40–150)
Anion Gap: 6 mEq/L (ref 3–11)
BILIRUBIN TOTAL: 0.54 mg/dL (ref 0.20–1.20)
BUN: 9.7 mg/dL (ref 7.0–26.0)
CHLORIDE: 103 meq/L (ref 98–109)
CO2: 28 meq/L (ref 22–29)
Calcium: 8.7 mg/dL (ref 8.4–10.4)
Creatinine: 0.8 mg/dL (ref 0.7–1.3)
GLUCOSE: 96 mg/dL (ref 70–140)
POTASSIUM: 4.4 meq/L (ref 3.5–5.1)
SODIUM: 137 meq/L (ref 136–145)
TOTAL PROTEIN: 8.8 g/dL — AB (ref 6.4–8.3)

## 2016-12-10 NOTE — Telephone Encounter (Signed)
Scheduled appt per 12/4 los - Gave patient AVS and calender per los. Central radiology to contact patient with ct schedule.

## 2016-12-10 NOTE — Progress Notes (Signed)
[place] [phone]   [phone]  CONSULT NOTE  REFERRING PHYSICIAN: Dr. Shirlyn Goltz  REASON FOR CONSULTATION:  56 years old white male with lung mass.  HPI Calvin Schroeder is a 56 y.o. male with past medical history significant for chronic back pain as well as long history of his smoking and alcohol abuse.  The patient mentions that he was in a cruise in the middle of November 2018.  After he came back home he was complaining of nausea vomiting as well as a few episodes of diarrhea and dizzy spells.  He presented to the emergency department for evaluation and during his evaluation a chest x-ray was performed on November 24, 2016 and it showed patchy infiltrate in the left upper lobe. This was followed by CT scan of the chest, abdomen and pelvis on November 24, 2016 and that showed no evidence for pulmonary embolus but there was severe centrilobular emphysema as well as area of consolidative changes in the left upper lobe likely pneumonia.  Underlying mass/neoplasm was not excluded.  There was also mildly enlarged hilar,, drinking and iliac lymph nodes of undetermined etiology. The patient was a started on treatment with Levaquin 750 mg p.o. daily for 7 days.  He was referred to me for evaluation and to rule out any underlying malignancy. When seen today the patient is feeling much better and continues to improve except for some tightness on the right side of the chest.  He also has mild cough with no hemoptysis.  He denied having any shortness of breath.  He denied having any significant weight loss or night sweats.  He has no nausea, vomiting, diarrhea or constipation.  He denied having any recent headache or visual changes. Family history significant for mother who died from lung cancer at age 69, father had skin cancer, maternal uncle had liver cancer and maternal grandmother had bladder cancer. The patient is single and has 1 daughter.  He was  accompanied by his significant other Sherrita.  He he works in Social worker.  He has a history of his smoking more than 1 pack/day for around 38 years.  He also smokes marijuana at regular basis for the last 42 years.  The patient also drinks 3 alcoholic drinks every day.  He has no other history of drug abuse.  HPI  Past Medical History:  Diagnosis Date  . Back pain   . Lung mass 06/10/2013  . Tobacco abuse 06/10/2013    Past Surgical History:  Procedure Laterality Date  . APPENDECTOMY    . SHOULDER SURGERY      Family History  Problem Relation Age of Onset  . Cancer Mother        lung ca  . Cancer Father        skin ca  . Cancer Maternal Uncle        liver ca  . Cancer Maternal Grandmother        bladder ca    Social History Social History   Tobacco Use  . Smoking status: Current Every Day Smoker    Packs/day: 1.50    Years: 34.00    Pack years: 51.00  . Smokeless tobacco: Never Used  Substance Use Topics  . Alcohol use: Yes    Comment: daily - 4-5 alcohol  . Drug use: Yes    Types: Marijuana    Allergies  Allergen Reactions  . Vicodin [Hydrocodone-Acetaminophen] Itching and Rash    Current Outpatient  Medications  Medication Sig Dispense Refill  . aspirin 81 MG tablet Take 81 mg by mouth every evening.     . metoCLOPramide (REGLAN) 10 MG tablet Take 1 tablet (10 mg total) every 6 (six) hours as needed by mouth for nausea (nausea/headache). 6 tablet 0  . Multiple Vitamin (MULTIVITAMIN WITH MINERALS) TABS tablet Take 1 tablet by mouth every evening.      No current facility-administered medications for this visit.     Review of Systems  Constitutional: positive for fatigue Eyes: negative Ears, nose, mouth, throat, and face: negative Respiratory: positive for cough Cardiovascular: negative Gastrointestinal: negative Genitourinary:negative Integument/breast: negative Hematologic/lymphatic: negative Musculoskeletal:negative Neurological:  negative Behavioral/Psych: negative Endocrine: negative Allergic/Immunologic: negative  Physical Exam  JJK:KXFGH, healthy, no distress, well nourished, well developed and anxious SKIN: skin color, texture, turgor are normal, no rashes or significant lesions HEAD: Normocephalic, No masses, lesions, tenderness or abnormalities EYES: normal, PERRLA, Conjunctiva are pink and non-injected EARS: External ears normal, Canals clear OROPHARYNX:no exudate, no erythema and lips, buccal mucosa, and tongue normal  NECK: supple, no adenopathy, no JVD LYMPH:  no palpable lymphadenopathy, no hepatosplenomegaly LUNGS: clear to auscultation , and palpation HEART: regular rate & rhythm, no murmurs and no gallops ABDOMEN:abdomen soft, non-tender, normal bowel sounds and no masses or organomegaly BACK: Back symmetric, no curvature., No CVA tenderness EXTREMITIES:no joint deformities, effusion, or inflammation, no edema, no skin discoloration  NEURO: alert & oriented x 3 with fluent speech, no focal motor/sensory deficits  PERFORMANCE STATUS: ECOG 1  LABORATORY DATA: Lab Results  Component Value Date   WBC 2.7 (L) 12/10/2016   HGB 14.3 12/10/2016   HCT 42.1 12/10/2016   MCV 98.9 (H) 12/10/2016   PLT 121 (L) 12/10/2016      Chemistry      Component Value Date/Time   NA 137 12/10/2016 1042   K 4.4 12/10/2016 1042   CL 105 11/24/2016 2017   CO2 28 12/10/2016 1042   BUN 9.7 12/10/2016 1042   CREATININE 0.8 12/10/2016 1042      Component Value Date/Time   CALCIUM 8.7 12/10/2016 1042   ALKPHOS 95 12/10/2016 1042   AST 26 12/10/2016 1042   ALT 31 12/10/2016 1042   BILITOT 0.54 12/10/2016 1042       RADIOGRAPHIC STUDIES: Dg Chest 2 View  Result Date: 11/24/2016 CLINICAL DATA:  Recent diarrhea and weight loss EXAM: CHEST  2 VIEW COMPARISON:  02/05/2014 FINDINGS: Cardiac shadow is within normal limits. The lungs are hyperinflated bilaterally. Patchy infiltrative changes are noted in the  left upper lobe. Some chronic changes in the right mid lung are seen. No acute bony abnormality is noted. IMPRESSION: Patchy infiltrative changes in the left upper lobe. Followup PA and lateral chest X-ray is recommended in 3-4 weeks following trial of antibiotic therapy to ensure resolution and exclude underlying malignancy. Electronically Signed   By: Inez Catalina M.D.   On: 11/24/2016 17:06   Dg Abdomen 1 View  Result Date: 11/24/2016 CLINICAL DATA:  Vomiting and weight loss, diarrhea EXAM: ABDOMEN - 1 VIEW COMPARISON:  None. FINDINGS: Scattered large and small bowel gas is noted. No obstructive changes are seen. The bony structures are within normal limits. No free air is noted. No other focal abnormality is seen. IMPRESSION: No acute abnormality noted. Electronically Signed   By: Inez Catalina M.D.   On: 11/24/2016 17:07   Ct Angio Chest Pe W And/or Wo Contrast  Result Date: 11/24/2016 CLINICAL DATA:  56 year old male with concern  for pulmonary embolism. EXAM: CT ANGIOGRAPHY CHEST CT ABDOMEN AND PELVIS WITH CONTRAST TECHNIQUE: Multidetector CT imaging of the chest was performed using the standard protocol during bolus administration of intravenous contrast. Multiplanar CT image reconstructions and MIPs were obtained to evaluate the vascular anatomy. Multidetector CT imaging of the abdomen and pelvis was performed using the standard protocol during bolus administration of intravenous contrast. CONTRAST:  174mL ISOVUE-370 IOPAMIDOL (ISOVUE-370) INJECTION 76% COMPARISON:  Abdominal radiograph dated 11/24/2016 and chest radiograph dated 11/24/2016 FINDINGS: CTA CHEST FINDINGS Cardiovascular: There is no cardiomegaly. Probable trace pericardial effusion. The thoracic aorta appears unremarkable. There is no CT evidence of pulmonary embolism. Mediastinum/Nodes: Bilateral hilar adenopathy measure up to 11 mm on the right. Multiple top-normal mediastinal lymph nodes measure approximately 10 mm in the AP window.  There is no mediastinal fluid collection. Lungs/Pleura: There is severe centrilobular emphysema. An area of consolidative change in the left upper lobe extending from the pleural surface to the suprahilar region most consistent with pneumonia. A mass is not excluded. Clinical correlation and follow-up to resolution after treatment and resolution of acute symptoms recommended. A cystic spaces within this consolidative change likely represents loss of parenchyma due to underlying emphysema and less likely a cavitary lesion. There is no pleural effusion or pneumothorax. The central airways are patent. Musculoskeletal: There is loss of subcutaneous fat. No acute osseous pathology. Review of the MIP images confirms the above findings. CT ABDOMEN and PELVIS FINDINGS No intra-abdominal free air.  Small free fluid within the pelvis. Hepatobiliary: No focal liver abnormality is seen. No gallstones, gallbladder wall thickening, or biliary dilatation. Pancreas: Unremarkable. No pancreatic ductal dilatation or surrounding inflammatory changes. Spleen: Normal in size without focal abnormality. Adrenals/Urinary Tract: Adrenal glands are unremarkable. Kidneys are normal, without renal calculi, focal lesion, or hydronephrosis. Bladder is unremarkable. Stomach/Bowel: There is no evidence of bowel obstruction or active inflammation. Appendectomy. Vascular/Lymphatic: There is moderate aortoiliac atherosclerotic disease. The origins of the celiac axis, SMA, IMA and the renal arteries are patent. The SMV, splenic vein, and main portal vein are patent. No portal venous gas. There are mildly enlarged retroperitoneal lymph nodes of indeterminate etiology. A somewhat rounded lymph node along the left external iliac chain measures 13 mm. Clinical correlation is recommended. Reproductive: The prostate and seminal vesicles are grossly unremarkable as visualized. Other: Loss of subcutaneous and mesenteric fat. Musculoskeletal: No acute or  significant osseous findings. Review of the MIP images confirms the above findings. IMPRESSION: 1. Severe centrilobular emphysema. An area of consolidative change in the left upper lobe likely pneumonia. Underlying mass/neoplasm is not excluded. Correlation with clinical exam and follow-up after resolution of acute symptoms recommended. 2. No CT evidence of pulmonary embolism. 3. Mildly enlarged hilar common retroperitoneal, and iliac lymph nodes of indeterminate etiology. Clinical correlation is recommended. 4. Aortic Atherosclerosis (ICD10-I70.0) and Emphysema (ICD10-J43.9). Electronically Signed   By: Anner Crete M.D.   On: 11/24/2016 22:03   Ct Abdomen Pelvis W Contrast  Result Date: 11/24/2016 CLINICAL DATA:  56 year old male with concern for pulmonary embolism. EXAM: CT ANGIOGRAPHY CHEST CT ABDOMEN AND PELVIS WITH CONTRAST TECHNIQUE: Multidetector CT imaging of the chest was performed using the standard protocol during bolus administration of intravenous contrast. Multiplanar CT image reconstructions and MIPs were obtained to evaluate the vascular anatomy. Multidetector CT imaging of the abdomen and pelvis was performed using the standard protocol during bolus administration of intravenous contrast. CONTRAST:  135mL ISOVUE-370 IOPAMIDOL (ISOVUE-370) INJECTION 76% COMPARISON:  Abdominal radiograph dated 11/24/2016 and chest radiograph  dated 11/24/2016 FINDINGS: CTA CHEST FINDINGS Cardiovascular: There is no cardiomegaly. Probable trace pericardial effusion. The thoracic aorta appears unremarkable. There is no CT evidence of pulmonary embolism. Mediastinum/Nodes: Bilateral hilar adenopathy measure up to 11 mm on the right. Multiple top-normal mediastinal lymph nodes measure approximately 10 mm in the AP window. There is no mediastinal fluid collection. Lungs/Pleura: There is severe centrilobular emphysema. An area of consolidative change in the left upper lobe extending from the pleural surface to the  suprahilar region most consistent with pneumonia. A mass is not excluded. Clinical correlation and follow-up to resolution after treatment and resolution of acute symptoms recommended. A cystic spaces within this consolidative change likely represents loss of parenchyma due to underlying emphysema and less likely a cavitary lesion. There is no pleural effusion or pneumothorax. The central airways are patent. Musculoskeletal: There is loss of subcutaneous fat. No acute osseous pathology. Review of the MIP images confirms the above findings. CT ABDOMEN and PELVIS FINDINGS No intra-abdominal free air.  Small free fluid within the pelvis. Hepatobiliary: No focal liver abnormality is seen. No gallstones, gallbladder wall thickening, or biliary dilatation. Pancreas: Unremarkable. No pancreatic ductal dilatation or surrounding inflammatory changes. Spleen: Normal in size without focal abnormality. Adrenals/Urinary Tract: Adrenal glands are unremarkable. Kidneys are normal, without renal calculi, focal lesion, or hydronephrosis. Bladder is unremarkable. Stomach/Bowel: There is no evidence of bowel obstruction or active inflammation. Appendectomy. Vascular/Lymphatic: There is moderate aortoiliac atherosclerotic disease. The origins of the celiac axis, SMA, IMA and the renal arteries are patent. The SMV, splenic vein, and main portal vein are patent. No portal venous gas. There are mildly enlarged retroperitoneal lymph nodes of indeterminate etiology. A somewhat rounded lymph node along the left external iliac chain measures 13 mm. Clinical correlation is recommended. Reproductive: The prostate and seminal vesicles are grossly unremarkable as visualized. Other: Loss of subcutaneous and mesenteric fat. Musculoskeletal: No acute or significant osseous findings. Review of the MIP images confirms the above findings. IMPRESSION: 1. Severe centrilobular emphysema. An area of consolidative change in the left upper lobe likely  pneumonia. Underlying mass/neoplasm is not excluded. Correlation with clinical exam and follow-up after resolution of acute symptoms recommended. 2. No CT evidence of pulmonary embolism. 3. Mildly enlarged hilar common retroperitoneal, and iliac lymph nodes of indeterminate etiology. Clinical correlation is recommended. 4. Aortic Atherosclerosis (ICD10-I70.0) and Emphysema (ICD10-J43.9). Electronically Signed   By: Anner Crete M.D.   On: 11/24/2016 22:03    ASSESSMENT: This is a very pleasant 56 years old white male presented with left upper lobe consolidative process suspicious for pneumonia versus underlying malignancy in addition to bilateral hilar lymphadenopathy.   PLAN: I had a lengthy discussion with the patient and his significant other about his current condition and further investigation to rule out malignancy. The process in the left upper lobe and lymphadenopathy is likely to be inflammatory in origin secondary to pneumonia but definitely underlying malignancy cannot be excluded at this point. I personally and independently reviewed the scan images and discussed the results and showed the images to the patient today. He completed a course of treatment with Levaquin for 7 days and he is feeling better. I recommended for the patient to have repeat CT scan of the chest in around 3 weeks for reevaluation of his condition. If he has persistent consolidation in the left upper lobe, I would consider the patient for a PET scan and further investigation to rule malignancy. For smoke cessation, I strongly encouraged the patient to quit smoking,  marijuana and alcohol abuse. The patient was advised to call immediately if he has any concerning symptoms in the interval. The patient voices understanding of current disease status and treatment options and is in agreement with the current care plan.  All questions were answered. The patient knows to call the clinic with any problems, questions or  concerns. We can certainly see the patient much sooner if necessary.  Thank you so much for allowing me to participate in the care of Calvin Schroeder. I will continue to follow up the patient with you and assist in his care.  I spent 40 minutes counseling the patient face to face. The total time spent in the appointment was 60 minutes.  Disclaimer: This note was dictated with voice recognition software. Similar sounding words can inadvertently be transcribed and may not be corrected upon review.   Eilleen Kempf December 10, 2016, 12:02 PM

## 2016-12-24 ENCOUNTER — Encounter (HOSPITAL_COMMUNITY): Payer: Self-pay

## 2016-12-24 ENCOUNTER — Ambulatory Visit (HOSPITAL_COMMUNITY)
Admission: RE | Admit: 2016-12-24 | Discharge: 2016-12-24 | Disposition: A | Discharge status: Home / Self Care | Payer: Self-pay | Source: Ambulatory Visit | Attending: Internal Medicine | Admitting: Internal Medicine

## 2016-12-24 DIAGNOSIS — R911 Solitary pulmonary nodule: Secondary | ICD-10-CM | POA: Insufficient documentation

## 2016-12-24 MED ORDER — IOPAMIDOL (ISOVUE-300) INJECTION 61%
INTRAVENOUS | Status: AC
  Administered 2016-12-24: 75 mL via INTRAVENOUS
  Filled 2016-12-24: qty 75

## 2016-12-24 MED ORDER — IOPAMIDOL (ISOVUE-300) INJECTION 61%
75.0000 mL | Freq: Once | INTRAVENOUS | Status: AC | PRN
  Administered 2016-12-24: 75 mL via INTRAVENOUS

## 2017-01-09 ENCOUNTER — Ambulatory Visit (HOSPITAL_BASED_OUTPATIENT_CLINIC_OR_DEPARTMENT_OTHER): Payer: Self-pay | Admitting: Oncology

## 2017-01-09 ENCOUNTER — Telehealth: Payer: Self-pay | Admitting: Oncology

## 2017-01-09 ENCOUNTER — Encounter: Payer: Self-pay | Admitting: Oncology

## 2017-01-09 VITALS — BP 132/87 | HR 77 | Temp 98.6°F | Resp 18 | Ht 69.0 in | Wt 128.6 lb

## 2017-01-09 DIAGNOSIS — R918 Other nonspecific abnormal finding of lung field: Secondary | ICD-10-CM

## 2017-01-09 NOTE — Assessment & Plan Note (Signed)
This is a very pleasant 57 year old white male presented with left upper lobe consolidative process suspicious for pneumonia versus underlying malignancy in addition to bilateral hilar lymphadenopathy.  Patient had a recent repeat CT scan of the chest.  The patient was seen with Dr. Julien Nordmann.  CT scan results were discussed with the patient which showed an unchanged 5 cm cystic lesion in the left apex of the lung.  This is suspicious for lung cancer.  Recommend the patient proceed with a PET scan to further evaluate this mass.  We discussed the possibility of proceeding with a biopsy if the PET scan shows that the lung mass is hypermetabolic.  Plan to see the patient back in approximately 2 weeks to review the PET scan results.  The patient was encouraged to stop smoking tobacco and marijuana and to stop drinking alcohol.  The patient was advised to call immediately if he has any concerning symptoms in the interval. The patient voices understanding of current disease status and treatment options and is in agreement with the current care plan.  All questions were answered. The patient knows to call the clinic with any problems, questions or concerns. We can certainly see the patient much sooner if necessary.

## 2017-01-09 NOTE — Progress Notes (Signed)
[facility] OFFICE PROGRESS NOTE  Patient, No Pcp Per No address on file  DIAGNOSIS: Left upper lobe consolidative process suspicious for pneumonia versus underlying malignancy in addition to bilateral hilar lymphadenopathy.  PRIOR THERAPY: None  CURRENT THERAPY: None  INTERVAL HISTORY: Calvin Schroeder 57 y.o. male returns for routine follow-up visit accompanied by significant other.  The patient is feeling fine today has no specific complaints.  He denies fevers and chills.  Denies chest pain shortness of breath, and hemoptysis.  He does report a cough only when he smokes marijuana.  Denies nausea, vomiting, constipation, diarrhea.  He denies any significant weight loss or night sweats.  The patient is here to discuss his recent CT scan results.  MEDICAL HISTORY: Past Medical History:  Diagnosis Date  . Back pain   . Lung mass 06/10/2013  . Tobacco abuse 06/10/2013    ALLERGIES:  is allergic to vicodin [hydrocodone-acetaminophen].  MEDICATIONS:  Current Outpatient Medications  Medication Sig Dispense Refill  . aspirin 81 MG tablet Take 81 mg by mouth every evening.     . metoCLOPramide (REGLAN) 10 MG tablet Take 1 tablet (10 mg total) every 6 (six) hours as needed by mouth for nausea (nausea/headache). 6 tablet 0  . Multiple Vitamin (MULTIVITAMIN WITH MINERALS) TABS tablet Take 1 tablet by mouth every evening.      No current facility-administered medications for this visit.     SURGICAL HISTORY:  Past Surgical History:  Procedure Laterality Date  . APPENDECTOMY    . SHOULDER SURGERY      REVIEW OF SYSTEMS:   Review of Systems  Constitutional: Negative for appetite change, chills, fatigue, fever and unexpected weight change.  HENT:   Negative for mouth sores, nosebleeds, sore throat and trouble swallowing.   Eyes: Negative for eye problems and icterus.  Respiratory: Negative for cough, hemoptysis, shortness of breath and wheezing.   Cardiovascular: Negative  for chest pain and leg swelling.  Gastrointestinal: Negative for abdominal pain, constipation, diarrhea, nausea and vomiting.  Genitourinary: Negative for bladder incontinence, difficulty urinating, dysuria, frequency and hematuria.   Musculoskeletal: Negative for back pain, gait problem, neck pain and neck stiffness.  Skin: Negative for itching and rash.  Neurological: Negative for dizziness, extremity weakness, gait problem, headaches, light-headedness and seizures.  Hematological: Negative for adenopathy. Does not bruise/bleed easily.  Psychiatric/Behavioral: Negative for confusion, depression and sleep disturbance. The patient is not nervous/anxious.     PHYSICAL EXAMINATION:  Blood pressure 132/87, pulse 77, temperature 98.6 F (37 C), temperature source Oral, resp. rate 18, height 5\' 9"  (1.753 m), weight 128 lb 9.6 oz (58.3 kg), SpO2 100 %.  ECOG PERFORMANCE STATUS: 1 - Symptomatic but completely ambulatory  Physical Exam  Constitutional: Oriented to person, place, and time and well-developed, well-nourished, and in no distress. No distress.  HENT:  Head: Normocephalic and atraumatic.  Mouth/Throat: Oropharynx is clear and moist. No oropharyngeal exudate.  Eyes: Conjunctivae are normal. Right eye exhibits no discharge. Left eye exhibits no discharge. No scleral icterus.  Neck: Normal range of motion. Neck supple.  Cardiovascular: Normal rate, regular rhythm, normal heart sounds and intact distal pulses.   Pulmonary/Chest: Effort normal and breath sounds normal. No respiratory distress. No wheezes. No rales.  Abdominal: Soft. Bowel sounds are normal. Exhibits no distension and no mass. There is no tenderness.  Musculoskeletal: Normal range of motion. Exhibits no edema.  Lymphadenopathy:    No cervical adenopathy.  Neurological: Alert and oriented to person, place, and time.  Exhibits normal muscle tone. Gait normal. Coordination normal.  Skin: Skin is warm and dry. No rash noted.  Not diaphoretic. No erythema. No pallor.  Psychiatric: Mood, memory and judgment normal.  Vitals reviewed.  LABORATORY DATA: Lab Results  Component Value Date   WBC 2.7 (L) 12/10/2016   HGB 14.3 12/10/2016   HCT 42.1 12/10/2016   MCV 98.9 (H) 12/10/2016   PLT 121 (L) 12/10/2016      Chemistry      Component Value Date/Time   NA 137 12/10/2016 1042   K 4.4 12/10/2016 1042   CL 105 11/24/2016 2017   CO2 28 12/10/2016 1042   BUN 9.7 12/10/2016 1042   CREATININE 0.8 12/10/2016 1042      Component Value Date/Time   CALCIUM 8.7 12/10/2016 1042   ALKPHOS 95 12/10/2016 1042   AST 26 12/10/2016 1042   ALT 31 12/10/2016 1042   BILITOT 0.54 12/10/2016 1042       RADIOGRAPHIC STUDIES:  Ct Chest W Contrast  Result Date: 12/24/2016 CLINICAL DATA:  Pneumonia.  Pulmonary mass.  01/18/2016 2018 EXAM: CT CHEST WITH CONTRAST TECHNIQUE: Multidetector CT imaging of the chest was performed during intravenous contrast administration. CONTRAST:  75 cc Isovue-300 COMPARISON:  11/24/2016 FINDINGS: Cardiovascular: The heart size is normal. No pericardial effusion. Coronary artery calcification is evident. No thoracic aortic aneurysm. Mediastinum/Nodes: Mediastinal lymphadenopathy is similar. 10 mm short axis AP window lymph node measured on the previous study is 10 mm today. 10 mm short axis subcarinal lymph node measures minimally smaller. The 11 mm short axis lymph node in the right hilum measured on the prior study is stable at 11 mm. The esophagus has normal imaging features. Increased number of lymph nodes in each axilla and subpectoral region is similar. Lungs/Pleura: No substantial change in the irregular cavitary lesion left apex measuring 4.5 x 3.5 cm today. Changes of emphysema again noted bilaterally. No pleural effusion. Upper Abdomen: Unremarkable. Musculoskeletal: Bone windows reveal no worrisome lytic or sclerotic osseous lesions. IMPRESSION: 1. No substantial change in the nearly 5 cm  cystic lesion in the left apex. Interval lack of change raises concern for neoplasm. PET-CT may be warranted to further evaluate. Electronically Signed   By: Misty Stanley M.D.   On: 12/24/2016 11:40     ASSESSMENT/PLAN:  Lung mass This is a very pleasant 57 year old white male presented with left upper lobe consolidative process suspicious for pneumonia versus underlying malignancy in addition to bilateral hilar lymphadenopathy.  Patient had a recent repeat CT scan of the chest.  The patient was seen with Dr. Julien Nordmann.  CT scan results were discussed with the patient which showed an unchanged 5 cm cystic lesion in the left apex of the lung.  This is suspicious for lung cancer.  Recommend the patient proceed with a PET scan to further evaluate this mass.  We discussed the possibility of proceeding with a biopsy if the PET scan shows that the lung mass is hypermetabolic.  Plan to see the patient back in approximately 2 weeks to review the PET scan results.  The patient was encouraged to stop smoking tobacco and marijuana and to stop drinking alcohol.  The patient was advised to call immediately if he has any concerning symptoms in the interval. The patient voices understanding of current disease status and treatment options and is in agreement with the current care plan.  All questions were answered. The patient knows to call the clinic with any problems, questions or concerns.  We can certainly see the patient much sooner if necessary.  Orders Placed This Encounter  Procedures  . NM PET Image Initial (PI) Skull Base To Thigh    Standing Status:   Future    Standing Expiration Date:   01/09/2018    Order Specific Question:   If indicated for the ordered procedure, I authorize the administration of a radiopharmaceutical per Radiology protocol    Answer:   Yes    Order Specific Question:   Preferred imaging location?    Answer:   [facility]    Order Specific Question:   Radiology  Contrast Protocol - do NOT remove file path    Answer:   [email or web address]    Order Specific Question:   Reason for Exam additional comments    Answer:   Left lung mass. Suspicious for lung cancer. Initial staging.    Mikey Bussing, DNP, AGPCNP-BC, AOCNP 01/09/17   ADDENDUM: Hematology/Oncology Attending: I had a face-to-face encounter with the patient.  I recommended his care plan.  This is a very pleasant 57 years old white male with suspicious cystic lesion in the left upper lobe concerning for neoplasm.  It was initially thought that the patient has inflammatory process in this area.  He was treated with a course of antibiotics and repeat CT scan of the chest performed recently showed no significant improvement of this lesion. I personally and independently reviewed the scans and discussed the results with the patient and his girlfriend today. I recommended for him to have a PET scan performed for further evaluation of this lesion.  If the PET scan showed hypermetabolic activity in this lesion, we will consider the patient for biopsy or referral for surgical resection. The patient agreed to the current plan.  We will see him back for follow-up visit in 2-3 weeks. He was advised to call immediately if he has any concerning symptoms in the interval.  Disclaimer: This note was dictated with voice recognition software. Similar sounding words can inadvertently be transcribed and may be missed upon review. Eilleen Kempf, MD 01/10/17

## 2017-01-09 NOTE — Telephone Encounter (Signed)
Scheduled appt per 1/3 los - Gave Patient AVS and calender per [facility] to contact patient with PET scan schedule .

## 2017-01-24 ENCOUNTER — Ambulatory Visit (HOSPITAL_COMMUNITY)
Admission: RE | Admit: 2017-01-24 | Discharge: 2017-01-24 | Disposition: A | Discharge status: Home / Self Care | Payer: Self-pay | Source: Ambulatory Visit | Attending: Oncology | Admitting: Oncology

## 2017-01-24 DIAGNOSIS — I700 Atherosclerosis of aorta: Secondary | ICD-10-CM | POA: Insufficient documentation

## 2017-01-24 DIAGNOSIS — I2510 Atherosclerotic heart disease of native coronary artery without angina pectoris: Secondary | ICD-10-CM | POA: Insufficient documentation

## 2017-01-24 DIAGNOSIS — M5136 Other intervertebral disc degeneration, lumbar region: Secondary | ICD-10-CM | POA: Insufficient documentation

## 2017-01-24 DIAGNOSIS — J432 Centrilobular emphysema: Secondary | ICD-10-CM | POA: Insufficient documentation

## 2017-01-24 DIAGNOSIS — R918 Other nonspecific abnormal finding of lung field: Secondary | ICD-10-CM | POA: Insufficient documentation

## 2017-01-24 DIAGNOSIS — M47816 Spondylosis without myelopathy or radiculopathy, lumbar region: Secondary | ICD-10-CM | POA: Insufficient documentation

## 2017-01-24 DIAGNOSIS — R591 Generalized enlarged lymph nodes: Secondary | ICD-10-CM | POA: Insufficient documentation

## 2017-01-24 LAB — GLUCOSE, CAPILLARY: GLUCOSE-CAPILLARY: 82 mg/dL (ref 65–99)

## 2017-01-24 MED ORDER — FLUDEOXYGLUCOSE F - 18 (FDG) INJECTION
6.4000 | Freq: Once | INTRAVENOUS | Status: AC | PRN
  Administered 2017-01-24: 6.4 via INTRAVENOUS

## 2017-01-27 ENCOUNTER — Inpatient Hospital Stay: Payer: Self-pay | Attending: Oncology | Admitting: Oncology

## 2017-01-27 ENCOUNTER — Encounter: Payer: Self-pay | Admitting: Oncology

## 2017-01-27 VITALS — BP 136/91 | HR 82 | Temp 97.5°F | Resp 18 | Wt 128.1 lb

## 2017-01-27 DIAGNOSIS — R918 Other nonspecific abnormal finding of lung field: Secondary | ICD-10-CM

## 2017-01-27 DIAGNOSIS — R911 Solitary pulmonary nodule: Secondary | ICD-10-CM | POA: Insufficient documentation

## 2017-01-27 DIAGNOSIS — F17210 Nicotine dependence, cigarettes, uncomplicated: Secondary | ICD-10-CM | POA: Insufficient documentation

## 2017-01-27 NOTE — Assessment & Plan Note (Addendum)
This is a very pleasant 57 year old white male presented with left upper lobe consolidative process suspicious for pneumonia versus underlying malignancy in addition to bilateral hilar lymphadenopathy.  Patient had a recent repeat CT scan of the chest which showed an unchanged 5 cm cystic lesion in the left apex of the lung.  This is suspicious for lung cancer.   The patient had a recent PET scan and is here to discuss the results.  The patient was seen with Dr. Julien Nordmann.  PET scan results were discussed with the patient and his significant other.  We discussed that there is a hypermetabolic mass in the left upper lobe which is suspicious for lung cancer.  We also discussed that there are hypermetabolic lymph nodes.  Recommend he proceed with a referral to cardiothoracic surgery to undergo a biopsy of these areas.  I have placed a call to the cardiothoracic surgery office who is aware of the referral and will contact the patient with his appointment.  We will plan to bring the patient back in approximately 2-3 weeks to discuss the biopsy results and treatment options.  The patient was encouraged to stop smoking tobacco and marijuana and to stop drinking alcohol.  The patient was advised to call immediately if he has any concerning symptoms in the interval. The patient voices understanding of current disease status and treatment options and is in agreement with the current care plan.  All questions were answered. The patient knows to call the clinic with any problems, questions or concerns. We can certainly see the patient much sooner if necessary.

## 2017-01-27 NOTE — Progress Notes (Signed)
[facility] OFFICE PROGRESS NOTE  Patient, No Pcp Per No address on file  DIAGNOSIS: Left upper lobe consolidative process suspicious for pneumonia versus underlying malignancy in addition to bilateral hilar lymphadenopathy.  PRIOR THERAPY: None  CURRENT THERAPY: None  INTERVAL HISTORY: Calvin Schroeder 57 y.o. male returns for routine follow-up visit accompanied by his significant other.  The patient is feeling fine today has no specific complaints.  He denies fevers and chills.  Denies chest pain, shortness of breath, hemoptysis.  He does report a cough only when he smokes marijuana.  He reports that he had a reddish color to his sputum on one occasion last Friday.  No overt hemoptysis was noted.  Denies nausea, vomiting, constipation, diarrhea.  He denies any significant weight loss or night sweats.  He had a PET scan recently and is here to discuss the results.  MEDICAL HISTORY: Past Medical History:  Diagnosis Date  . Back pain   . Lung mass 06/10/2013  . Tobacco abuse 06/10/2013    ALLERGIES:  is allergic to vicodin [hydrocodone-acetaminophen].  MEDICATIONS:  Current Outpatient Medications  Medication Sig Dispense Refill  . aspirin 81 MG tablet Take 81 mg by mouth every evening.      No current facility-administered medications for this visit.     SURGICAL HISTORY:  Past Surgical History:  Procedure Laterality Date  . APPENDECTOMY    . SHOULDER SURGERY      REVIEW OF SYSTEMS:   Review of Systems  Constitutional: Negative for appetite change, chills, fatigue, fever and unexpected weight change.  HENT:   Negative for mouth sores, nosebleeds, sore throat and trouble swallowing.   Eyes: Negative for eye problems and icterus.  Respiratory: Negative for hemoptysis, shortness of breath and wheezing.  Positive for cough when he smokes marijuana. Cardiovascular: Negative for chest pain and leg swelling.  Gastrointestinal: Negative for abdominal pain,  constipation, diarrhea, nausea and vomiting.  Genitourinary: Negative for bladder incontinence, difficulty urinating, dysuria, frequency and hematuria.   Musculoskeletal: Negative for back pain, gait problem, neck pain and neck stiffness.  Skin: Negative for itching and rash.  Neurological: Negative for dizziness, extremity weakness, gait problem, headaches, light-headedness and seizures.  Hematological: Negative for adenopathy. Does not bruise/bleed easily.  Psychiatric/Behavioral: Negative for confusion, depression and sleep disturbance. The patient is not nervous/anxious.     PHYSICAL EXAMINATION:  Blood pressure (!) 136/91, pulse 82, temperature (!) 97.5 F (36.4 C), temperature source Oral, resp. rate 18, weight 128 lb 1.6 oz (58.1 kg), SpO2 100 %.  ECOG PERFORMANCE STATUS: 1 - Symptomatic but completely ambulatory  Physical Exam  Constitutional: Oriented to person, place, and time and well-developed, well-nourished, and in no distress. No distress.  HENT:  Head: Normocephalic and atraumatic.  Mouth/Throat: Oropharynx is clear and moist. No oropharyngeal exudate.  Eyes: Conjunctivae are normal. Right eye exhibits no discharge. Left eye exhibits no discharge. No scleral icterus.  Neck: Normal range of motion. Neck supple.  Cardiovascular: Normal rate, regular rhythm, normal heart sounds and intact distal pulses.   Pulmonary/Chest: Effort normal and breath sounds normal. No respiratory distress. No wheezes. No rales.  Abdominal: Soft. Bowel sounds are normal. Exhibits no distension and no mass. There is no tenderness.  Musculoskeletal: Normal range of motion. Exhibits no edema.  Lymphadenopathy:    No cervical adenopathy.  Neurological: Alert and oriented to person, place, and time. Exhibits normal muscle tone. Gait normal. Coordination normal.  Skin: Skin is warm and dry. No rash noted. Not  diaphoretic. No erythema. No pallor.  Psychiatric: Mood, memory and judgment normal.  Vitals  reviewed.  LABORATORY DATA: Lab Results  Component Value Date   WBC 2.7 (L) 12/10/2016   HGB 14.3 12/10/2016   HCT 42.1 12/10/2016   MCV 98.9 (H) 12/10/2016   PLT 121 (L) 12/10/2016      Chemistry      Component Value Date/Time   NA 137 12/10/2016 1042   K 4.4 12/10/2016 1042   CL 105 11/24/2016 2017   CO2 28 12/10/2016 1042   BUN 9.7 12/10/2016 1042   CREATININE 0.8 12/10/2016 1042      Component Value Date/Time   CALCIUM 8.7 12/10/2016 1042   ALKPHOS 95 12/10/2016 1042   AST 26 12/10/2016 1042   ALT 31 12/10/2016 1042   BILITOT 0.54 12/10/2016 1042       RADIOGRAPHIC STUDIES:  Nm Pet Image Initial (pi) Skull Base To Thigh  Result Date: 01/24/2017 CLINICAL DATA:  Initial treatment strategy for lung mass. EXAM: NUCLEAR MEDICINE PET SKULL BASE TO THIGH TECHNIQUE: 6.4 mCi F-18 FDG was injected intravenously. Full-ring PET imaging was performed from the skull base to thigh after the radiotracer. CT data was obtained and used for attenuation correction and anatomic localization. FASTING BLOOD GLUCOSE:  Value: 82 mg/dl COMPARISON:  Chest CT dated 12/24/2016 FINDINGS: NECK Generally small but hypermetabolic bilateral level IIa, IIb, level Ib, level III and left V level lymph nodes are present. Index left level II B lymph node measures 1.0 cm in short axis on image 29/4 with maximum SUV 4.5. There is some irregular activity along the anterior tongue base without well-defined CT corollary, maximum SUV in this region 7.3. Mildly asymmetric palatine tonsillar activity, 5.0 on the right and 3.2 on the left, significance uncertain. CHEST The margins of the cavitary left upper lobe mass are notably hypermetabolic, with the peripheral portion having a maximum SUV of 11.9 and the more medial apical portion having a maximum SUV of 9.4. Bilateral hypermetabolic axillary adenopathy along with small hypermetabolic prevascular, paratracheal, AP window, hilar, subcarinal, pericardial, and periaortic  adenopathy. Index right hilar node 1.2 cm in short axis with maximum SUV 5.6. Index prevascular lymph node 0.6 cm in short axis with maximum SUV 6.1. An index left upper axillary lymph node measuring 1.0 cm in short axis has a maximum SUV of 5.5. Left anterior descending coronary artery atherosclerosis. Severe centrilobular emphysema. Background blood pool activity 1.7. ABDOMEN/PELVIS Mildly enlarged and mildly hypermetabolic lymph nodes in the gastrohepatic ligament, porta hepatis, periaortic region, right common iliac chain, external iliac chains bilaterally, and inguinal chains bilaterally. For example a peripancreatic lymph node on image 131/4 has a maximum SUV of 4.9 a right external iliac node measuring 1.4 cm in short axis on image 191/4 has a maximum SUV of 6.9. A similar left-sided external iliac lymph node measuring 1.3 cm in short axis on image 191/4 has a maximum SUV of 5.5. The spleen measures 9.6 by 4.7 by 11.0 cm (volume = 260 cm^3). Generalized splenic activity is greater than the liver (3.3, versus 2.5). Aortoiliac atherosclerotic vascular disease. SKELETON No focal hypermetabolic activity to suggest skeletal metastasis. Grade 1 anterolisthesis at L4-5. Spondylosis and degenerative disc disease at L5-S1. IMPRESSION: 1. Hypermetabolic cavitary left upper lobe mass, with scattered upper normal to mildly enlarged, mildly hypermetabolic lymph nodes in the neck, chest, abdomen, and pelvis. The nodal pattern would be unusual for metastatic lung cancer, and there is also some faintly accentuated metabolic activity in the otherwise  normal size spleen. The splenic appearance and nodes raise the possibility of lymphoma. The left upper lobe mass could be cavitary lung cancer (favoring squamous cell or small-cell) or potentially a granulomatous infectious process such as tuberculosis. This is not an overall common pattern for sarcoidosis. I would suggest tissue diagnosis of the lung mass and one of the more  easily accessible lymph nodes. 2. There is some accentuated activity in the tongue and palatine tonsils without well-defined CT corollary, probably physiologic. 3. Other imaging findings of potential clinical significance: Aortic Atherosclerosis (ICD10-I70.0) and Emphysema (ICD10-J43.9). Coronary artery atherosclerosis. Lower lumbar spondylosis and degenerative disc disease. Electronically Signed   By: Van Clines M.D.   On: 01/24/2017 15:50     ASSESSMENT/PLAN:  Lung mass This is a very pleasant 57 year old white male presented with left upper lobe consolidative process suspicious for pneumonia versus underlying malignancy in addition to bilateral hilar lymphadenopathy.  Patient had a recent repeat CT scan of the chest which showed an unchanged 5 cm cystic lesion in the left apex of the lung.  This is suspicious for lung cancer.   The patient had a recent PET scan and is here to discuss the results.  The patient was seen with Dr. Julien Nordmann.  PET scan results were discussed with the patient and his significant other.  We discussed that there is a hypermetabolic mass in the left upper lobe which is suspicious for lung cancer.  We also discussed that there are hypermetabolic lymph nodes.  Recommend he proceed with a referral to cardiothoracic surgery to undergo a biopsy of these areas.  I have placed a call to the cardiothoracic surgery office who is aware of the referral and will contact the patient with his appointment.  We will plan to bring the patient back in approximately 2-3 weeks to discuss the biopsy results and treatment options.  The patient was encouraged to stop smoking tobacco and marijuana and to stop drinking alcohol.  The patient was advised to call immediately if he has any concerning symptoms in the interval. The patient voices understanding of current disease status and treatment options and is in agreement with the current care plan.  All questions were answered. The  patient knows to call the clinic with any problems, questions or concerns. We can certainly see the patient much sooner if necessary.  Orders Placed This Encounter  Procedures  . Ambulatory referral to Cardiothoracic Surgery    Referral Priority:   Routine    Referral Type:   Surgical    Referral Reason:   Specialty Services Required    Referred to Provider:   [name], MD    Requested Specialty:   Cardiothoracic Surgery    Number of Visits Requested:   [place], [place], AGPCNP-BC, AOCNP 01/27/17   ADDENDUM: Hematology/Oncology Attending: I had a face-to-face encounter with the patient today.  I recommended his care plan.  This is a very pleasant 57 years old white male with suspicious cavitary lesion in the left upper lobe as well as mediastinal lymphadenopathy.  The patient underwent a recent PET scan that confirmed the hypermetabolic activity in the left upper lobe cavitary lesion as well as the mediastinal lymphadenopathy. I had a lengthy discussion with the patient today about his condition.  I recommended for him to see cardiothoracic surgery for consideration of bronchoscopy as well as endobronchial ultrasound and biopsies of the lung mass and mediastinal lymphadenopathy to rule out malignancy. We will see  the patient back for follow-up visit in 2-3 weeks depending on the biopsy results for discussion of his treatment options. He was advised to call immediately if he has any concerning symptoms in the interval.  Disclaimer: This note was dictated with voice recognition software. Similar sounding words can inadvertently be transcribed and may be missed upon review. Eilleen Kempf, MD 01/27/17

## 2017-01-28 ENCOUNTER — Institutional Professional Consult (permissible substitution) (INDEPENDENT_AMBULATORY_CARE_PROVIDER_SITE_OTHER): Payer: Self-pay | Admitting: Thoracic Surgery (Cardiothoracic Vascular Surgery)

## 2017-01-28 ENCOUNTER — Encounter: Payer: Self-pay | Admitting: Thoracic Surgery (Cardiothoracic Vascular Surgery)

## 2017-01-28 VITALS — BP 133/83 | HR 83 | Resp 20 | Ht 69.0 in | Wt 128.0 lb

## 2017-01-28 DIAGNOSIS — R918 Other nonspecific abnormal finding of lung field: Secondary | ICD-10-CM

## 2017-01-28 NOTE — H&P (View-Only) (Signed)
PCP is Patient, No Pcp Per Referring Provider is Curcio, Roselie Awkward, NP  Chief Complaint  Patient presents with  . Lung Lesion    Surgical eval, PET Scan 1/18./19, Chest CT 12/24/16    HPI: Calvin Schroeder is sent for consultation regarding a left upper lobe lung mass with mediastinal and hilar adenopathy  Calvin Schroeder is a 57 year old man with a 50+ pack year history of tobacco abuse.  He also has a history of ethanol and marijuana use.  He recently was on a cruise in the [place].  He presented to the emergency room shortly after returning home with complaints of nausea, vomiting, loose stools, and near syncope.  As part of his workup he had a chest x-ray which showed a left upper lobe infiltrate.  A CT angiogram was done to rule out pulmonary embolus.  It demonstrated severe emphysema and a complex consolidative change in the left upper lobe.  He was referred to Dr. Julien Nordmann.  Repeat CT was done in December which showed persistence of the left upper lobe opacity.  A PET/CT showed this area was hypermetabolic.  There also was extensive hypermetabolic adenopathy in the mediastinum and both hila, cervical, and bilateral axillary lymph nodes.  He has had a productive cough.  Sputum is primarily clear but he did have one episode of streaky hemoptysis last Friday.  He has not had any change in appetite or weight loss.  He continues to smoke 1.5 packs of cigarettes daily.  He denies any chest pain, pressure, tightness, or shortness of breath with exertion.  He works in [employer].  Zubrod Score: At the time of surgery this patient's most appropriate activity status/level should be described as: [x]     0    Normal activity, no symptoms []     1    Restricted in physical strenuous activity but ambulatory, able to do out light work []     2    Ambulatory and capable of self care, unable to do work activities, up and about >50 % of waking hours                              []     3    Only limited self care,  in bed greater than 50% of waking hours []     4    Completely disabled, no self care, confined to bed or chair []     5    Moribund  Past Medical History:  Diagnosis Date  . Back pain   . Lung mass 06/10/2013  . Tobacco abuse 06/10/2013    Past Surgical History:  Procedure Laterality Date  . APPENDECTOMY    . SHOULDER SURGERY      Family History  Problem Relation Age of Onset  . Cancer Mother        lung ca  . Cancer Father        skin ca  . Cancer Maternal Uncle        liver ca  . Cancer Maternal Grandmother        bladder ca    Social History Social History   Tobacco Use  . Smoking status: Current Every Day Smoker    Packs/day: 1.50    Years: 34.00    Pack years: 51.00  . Smokeless tobacco: Never Used  Substance Use Topics  . Alcohol use: Yes    Comment: daily - 3-4 alcohol  . Drug use: Yes  Types: Marijuana    Current Outpatient Medications  Medication Sig Dispense Refill  . aspirin 81 MG tablet Take 81 mg by mouth every evening.      No current facility-administered medications for this visit.     Allergies  Allergen Reactions  . Vicodin [Hydrocodone-Acetaminophen] Itching and Rash    Review of Systems  Constitutional: Negative for activity change, appetite change, chills, fever and unexpected weight change.  HENT: Negative for trouble swallowing and voice change.   Eyes: Negative for visual disturbance.  Respiratory: Positive for cough (Productive, one episode hemoptysis). Negative for shortness of breath and wheezing.   Cardiovascular: Negative for chest pain and leg swelling.  Gastrointestinal: Positive for nausea and vomiting.  Genitourinary: Negative for difficulty urinating and dysuria.  Musculoskeletal: Positive for arthralgias and back pain.  Neurological: Negative for syncope, weakness and headaches.  Hematological: Negative for adenopathy. Does not bruise/bleed easily.  All other systems reviewed and are negative.   BP 133/83   Pulse  83   Resp 20   Ht 5\' 9"  (1.753 m)   Wt 128 lb (58.1 kg)   SpO2 98% Comment: RA  BMI 18.90 kg/m  Physical Exam  Constitutional: He is oriented to person, place, and time. He appears well-developed and well-nourished. No distress.  HENT:  Head: Normocephalic and atraumatic.  Mouth/Throat: No oropharyngeal exudate.  Eyes: Conjunctivae and EOM are normal. Pupils are equal, round, and reactive to light. No scleral icterus.  Neck: Neck supple. No thyromegaly present.  Cardiovascular: Normal rate, regular rhythm, normal heart sounds and intact distal pulses. Exam reveals no gallop and no friction rub.  No murmur heard. Pulmonary/Chest: Effort normal and breath sounds normal. No respiratory distress. He has no wheezes. He has no rales.  Abdominal: Soft. He exhibits no distension. There is no tenderness.  Musculoskeletal: He exhibits no edema.  Lymphadenopathy:    He has cervical adenopathy (Shotty left supraclavicular adenopathy).  Neurological: He is alert and oriented to person, place, and time. No cranial nerve deficit. He exhibits normal muscle tone.  Skin: Skin is warm and dry.  Vitals reviewed.    Diagnostic Tests: CT CHEST WITH CONTRAST  TECHNIQUE: Multidetector CT imaging of the chest was performed during intravenous contrast administration.  CONTRAST:  75 cc Isovue-300  COMPARISON:  11/24/2016  FINDINGS: Cardiovascular: The heart size is normal. No pericardial effusion. Coronary artery calcification is evident. No thoracic aortic aneurysm.  Mediastinum/Nodes: Mediastinal lymphadenopathy is similar. 10 mm short axis AP window lymph node measured on the previous study is 10 mm today. 10 mm short axis subcarinal lymph node measures minimally smaller. The 11 mm short axis lymph node in the right hilum measured on the prior study is stable at 11 mm. The esophagus has normal imaging features. Increased number of lymph nodes in each axilla and subpectoral region is  similar.  Lungs/Pleura: No substantial change in the irregular cavitary lesion left apex measuring 4.5 x 3.5 cm today. Changes of emphysema again noted bilaterally. No pleural effusion.  Upper Abdomen: Unremarkable.  Musculoskeletal: Bone windows reveal no worrisome lytic or sclerotic osseous lesions.  IMPRESSION: 1. No substantial change in the nearly 5 cm cystic lesion in the left apex. Interval lack of change raises concern for neoplasm. PET-CT may be warranted to further evaluate.   Electronically Signed   By: Misty Stanley M.D.   On: 12/24/2016 11:40    NUCLEAR MEDICINE PET SKULL BASE TO THIGH  TECHNIQUE: 6.4 mCi F-18 FDG was injected intravenously. Full-ring PET  imaging was performed from the skull base to thigh after the radiotracer. CT data was obtained and used for attenuation correction and anatomic localization.  FASTING BLOOD GLUCOSE:  Value: 82 mg/dl  COMPARISON:  Chest CT dated 12/24/2016  FINDINGS: NECK  Generally small but hypermetabolic bilateral level IIa, IIb, level Ib, level III and left V level lymph nodes are present. Index left level II B lymph node measures 1.0 cm in short axis on image 29/4 with maximum SUV 4.5.  There is some irregular activity along the anterior tongue base without well-defined CT corollary, maximum SUV in this region 7.3. Mildly asymmetric palatine tonsillar activity, 5.0 on the right and 3.2 on the left, significance uncertain.  CHEST  The margins of the cavitary left upper lobe mass are notably hypermetabolic, with the peripheral portion having a maximum SUV of 11.9 and the more medial apical portion having a maximum SUV of 9.4.  Bilateral hypermetabolic axillary adenopathy along with small hypermetabolic prevascular, paratracheal, AP window, hilar, subcarinal, pericardial, and periaortic adenopathy. Index right hilar node 1.2 cm in short axis with maximum SUV 5.6. Index prevascular lymph node 0.6  cm in short axis with maximum SUV 6.1. An index left upper axillary lymph node measuring 1.0 cm in short axis has a maximum SUV of 5.5.  Left anterior descending coronary artery atherosclerosis. Severe centrilobular emphysema.  Background blood pool activity 1.7.  ABDOMEN/PELVIS  Mildly enlarged and mildly hypermetabolic lymph nodes in the gastrohepatic ligament, porta hepatis, periaortic region, right common iliac chain, external iliac chains bilaterally, and inguinal chains bilaterally. For example a peripancreatic lymph node on image 131/4 has a maximum SUV of 4.9 a right external iliac node measuring 1.4 cm in short axis on image 191/4 has a maximum SUV of 6.9. A similar left-sided external iliac lymph node measuring 1.3 cm in short axis on image 191/4 has a maximum SUV of 5.5.  The spleen measures 9.6 by 4.7 by 11.0 cm (volume = 260 cm^3). Generalized splenic activity is greater than the liver (3.3, versus 2.5).  Aortoiliac atherosclerotic vascular disease.  SKELETON  No focal hypermetabolic activity to suggest skeletal metastasis.  Grade 1 anterolisthesis at L4-5. Spondylosis and degenerative disc disease at L5-S1.  IMPRESSION: 1. Hypermetabolic cavitary left upper lobe mass, with scattered upper normal to mildly enlarged, mildly hypermetabolic lymph nodes in the neck, chest, abdomen, and pelvis. The nodal pattern would be unusual for metastatic lung cancer, and there is also some faintly accentuated metabolic activity in the otherwise normal size spleen. The splenic appearance and nodes raise the possibility of lymphoma. The left upper lobe mass could be cavitary lung cancer (favoring squamous cell or small-cell) or potentially a granulomatous infectious process such as tuberculosis. This is not an overall common pattern for sarcoidosis. I would suggest tissue diagnosis of the lung mass and one of the more easily accessible lymph nodes. 2. There is some  accentuated activity in the tongue and palatine tonsils without well-defined CT corollary, probably physiologic. 3. Other imaging findings of potential clinical significance: Aortic Atherosclerosis (ICD10-I70.0) and Emphysema (ICD10-J43.9). Coronary artery atherosclerosis. Lower lumbar spondylosis and degenerative disc disease.   Electronically Signed   By: Van Clines M.D.   On: 01/24/2017 15:50 I personally reviewed the CT and PET/CT images and concur with the findings noted above.  Impression: Calvin Schroeder is a 57 year old man with a history of tobacco abuse who has a complex cystic mass/consolidation in the left upper lobe with bilateral hilar and mediastinal adenopathy.  He also  has adenopathy in his cervical nodes and bilateral axillary nodes.  It is unclear if this represents 1 underlying process or if multiple processes are involved.  The lung lesion and mediastinal and hilar adenopathy are highly suspicious for a new primary bronchogenic carcinoma.  However atypical infections or inflammation are also in the differential diagnosis.  He needs a tissue diagnosis.  I recommended that we proceed with navigational bronchoscopy and endobronchial ultrasound for diagnostic purposes.  The other alternative would be to do CT-guided biopsies.  That would not provide [place] with any information regarding the lymph nodes in the chest.  If the ENB/EBUS is negative CT-guided biopsy would be the next option.  I described the proposed procedure to him.  He understands this is done in the operating room under general anesthesia.  It is an endoscopic procedure with no incisions involved.  He understands the risks include those of general anesthesia.  Those risks include MI, DVT, PE.  There are also procedure specific risks such as bleeding and pneumothorax.  He understands there is a possibility that we might not be able to make a definitive diagnosis.  I offered him the option of proceeding on Friday,  January 25, but he is unable to do so.  He wishes to proceed on Friday, February 1  Emphysema-has severe centrilobular emphysema on CT.  He has not previously been diagnosed.  Tobacco abuse- Smoking cessation instruction/counseling given:  counseled patient on the dangers of tobacco use, advised patient to stop smoking, and reviewed strategies to maximize success.  He does not seem interested in quitting at this time.  Plan: Navigational bronchoscopy and endobronchial ultrasound on Friday, 02/07/2017  [name], MD [facility] [phone]

## 2017-01-28 NOTE — Progress Notes (Signed)
PCP is Patient, No Pcp Per Referring Provider is Curcio, Roselie Awkward, NP  Chief Complaint  Patient presents with  . Lung Lesion    Surgical eval, PET Scan 1/18./19, Chest CT 12/24/16    HPI: Calvin Schroeder is sent for consultation regarding a left upper lobe lung mass with mediastinal and hilar adenopathy  Calvin Schroeder is a 57 year old man with a 50+ pack year history of tobacco abuse.  He also has a history of ethanol and marijuana use.  He recently was on a cruise in the [place].  He presented to the emergency room shortly after returning home with complaints of nausea, vomiting, loose stools, and near syncope.  As part of his workup he had a chest x-ray which showed a left upper lobe infiltrate.  A CT angiogram was done to rule out pulmonary embolus.  It demonstrated severe emphysema and a complex consolidative change in the left upper lobe.  He was referred to Dr. Julien Nordmann.  Repeat CT was done in December which showed persistence of the left upper lobe opacity.  A PET/CT showed this area was hypermetabolic.  There also was extensive hypermetabolic adenopathy in the mediastinum and both hila, cervical, and bilateral axillary lymph nodes.  He has had a productive cough.  Sputum is primarily clear but he did have one episode of streaky hemoptysis last Friday.  He has not had any change in appetite or weight loss.  He continues to smoke 1.5 packs of cigarettes daily.  He denies any chest pain, pressure, tightness, or shortness of breath with exertion.  He works in Architect.  Zubrod Score: At the time of surgery this patient's most appropriate activity status/level should be described as: [x]     0    Normal activity, no symptoms []     1    Restricted in physical strenuous activity but ambulatory, able to do out light work []     2    Ambulatory and capable of self care, unable to do work activities, up and about >50 % of waking hours                              []     3    Only limited self care,  in bed greater than 50% of waking hours []     4    Completely disabled, no self care, confined to bed or chair []     5    Moribund  Past Medical History:  Diagnosis Date  . Back pain   . Lung mass 06/10/2013  . Tobacco abuse 06/10/2013    Past Surgical History:  Procedure Laterality Date  . APPENDECTOMY    . SHOULDER SURGERY      Family History  Problem Relation Age of Onset  . Cancer Mother        lung ca  . Cancer Father        skin ca  . Cancer Maternal Uncle        liver ca  . Cancer Maternal Grandmother        bladder ca    Social History Social History   Tobacco Use  . Smoking status: Current Every Day Smoker    Packs/day: 1.50    Years: 34.00    Pack years: 51.00  . Smokeless tobacco: Never Used  Substance Use Topics  . Alcohol use: Yes    Comment: daily - 3-4 alcohol  . Drug use: Yes  Types: Marijuana    Current Outpatient Medications  Medication Sig Dispense Refill  . aspirin 81 MG tablet Take 81 mg by mouth every evening.      No current facility-administered medications for this visit.     Allergies  Allergen Reactions  . Vicodin [Hydrocodone-Acetaminophen] Itching and Rash    Review of Systems  Constitutional: Negative for activity change, appetite change, chills, fever and unexpected weight change.  HENT: Negative for trouble swallowing and voice change.   Eyes: Negative for visual disturbance.  Respiratory: Positive for cough (Productive, one episode hemoptysis). Negative for shortness of breath and wheezing.   Cardiovascular: Negative for chest pain and leg swelling.  Gastrointestinal: Positive for nausea and vomiting.  Genitourinary: Negative for difficulty urinating and dysuria.  Musculoskeletal: Positive for arthralgias and back pain.  Neurological: Negative for syncope, weakness and headaches.  Hematological: Negative for adenopathy. Does not bruise/bleed easily.  All other systems reviewed and are negative.   BP 133/83   Pulse  83   Resp 20   Ht 5\' 9"  (1.753 m)   Wt 128 lb (58.1 kg)   SpO2 98% Comment: RA  BMI 18.90 kg/m  Physical Exam  Constitutional: He is oriented to person, place, and time. He appears well-developed and well-nourished. No distress.  HENT:  Head: Normocephalic and atraumatic.  Mouth/Throat: No oropharyngeal exudate.  Eyes: Conjunctivae and EOM are normal. Pupils are equal, round, and reactive to light. No scleral icterus.  Neck: Neck supple. No thyromegaly present.  Cardiovascular: Normal rate, regular rhythm, normal heart sounds and intact distal pulses. Exam reveals no gallop and no friction rub.  No murmur heard. Pulmonary/Chest: Effort normal and breath sounds normal. No respiratory distress. He has no wheezes. He has no rales.  Abdominal: Soft. He exhibits no distension. There is no tenderness.  Musculoskeletal: He exhibits no edema.  Lymphadenopathy:    He has cervical adenopathy (Shotty left supraclavicular adenopathy).  Neurological: He is alert and oriented to person, place, and time. No cranial nerve deficit. He exhibits normal muscle tone.  Skin: Skin is warm and dry.  Vitals reviewed.    Diagnostic Tests: CT CHEST WITH CONTRAST  TECHNIQUE: Multidetector CT imaging of the chest was performed during intravenous contrast administration.  CONTRAST:  75 cc Isovue-300  COMPARISON:  11/24/2016  FINDINGS: Cardiovascular: The heart size is normal. No pericardial effusion. Coronary artery calcification is evident. No thoracic aortic aneurysm.  Mediastinum/Nodes: Mediastinal lymphadenopathy is similar. 10 mm short axis AP window lymph node measured on the previous study is 10 mm today. 10 mm short axis subcarinal lymph node measures minimally smaller. The 11 mm short axis lymph node in the right hilum measured on the prior study is stable at 11 mm. The esophagus has normal imaging features. Increased number of lymph nodes in each axilla and subpectoral region is  similar.  Lungs/Pleura: No substantial change in the irregular cavitary lesion left apex measuring 4.5 x 3.5 cm today. Changes of emphysema again noted bilaterally. No pleural effusion.  Upper Abdomen: Unremarkable.  Musculoskeletal: Bone windows reveal no worrisome lytic or sclerotic osseous lesions.  IMPRESSION: 1. No substantial change in the nearly 5 cm cystic lesion in the left apex. Interval lack of change raises concern for neoplasm. PET-CT may be warranted to further evaluate.   Electronically Signed   By: Misty Stanley M.D.   On: 12/24/2016 11:40    NUCLEAR MEDICINE PET SKULL BASE TO THIGH  TECHNIQUE: 6.4 mCi F-18 FDG was injected intravenously. Full-ring PET  imaging was performed from the skull base to thigh after the radiotracer. CT data was obtained and used for attenuation correction and anatomic localization.  FASTING BLOOD GLUCOSE:  Value: 82 mg/dl  COMPARISON:  Chest CT dated 12/24/2016  FINDINGS: NECK  Generally small but hypermetabolic bilateral level IIa, IIb, level Ib, level III and left V level lymph nodes are present. Index left level II B lymph node measures 1.0 cm in short axis on image 29/4 with maximum SUV 4.5.  There is some irregular activity along the anterior tongue base without well-defined CT corollary, maximum SUV in this region 7.3. Mildly asymmetric palatine tonsillar activity, 5.0 on the right and 3.2 on the left, significance uncertain.  CHEST  The margins of the cavitary left upper lobe mass are notably hypermetabolic, with the peripheral portion having a maximum SUV of 11.9 and the more medial apical portion having a maximum SUV of 9.4.  Bilateral hypermetabolic axillary adenopathy along with small hypermetabolic prevascular, paratracheal, AP window, hilar, subcarinal, pericardial, and periaortic adenopathy. Index right hilar node 1.2 cm in short axis with maximum SUV 5.6. Index prevascular lymph node 0.6  cm in short axis with maximum SUV 6.1. An index left upper axillary lymph node measuring 1.0 cm in short axis has a maximum SUV of 5.5.  Left anterior descending coronary artery atherosclerosis. Severe centrilobular emphysema.  Background blood pool activity 1.7.  ABDOMEN/PELVIS  Mildly enlarged and mildly hypermetabolic lymph nodes in the gastrohepatic ligament, porta hepatis, periaortic region, right common iliac chain, external iliac chains bilaterally, and inguinal chains bilaterally. For example a peripancreatic lymph node on image 131/4 has a maximum SUV of 4.9 a right external iliac node measuring 1.4 cm in short axis on image 191/4 has a maximum SUV of 6.9. A similar left-sided external iliac lymph node measuring 1.3 cm in short axis on image 191/4 has a maximum SUV of 5.5.  The spleen measures 9.6 by 4.7 by 11.0 cm (volume = 260 cm^3). Generalized splenic activity is greater than the liver (3.3, versus 2.5).  Aortoiliac atherosclerotic vascular disease.  SKELETON  No focal hypermetabolic activity to suggest skeletal metastasis.  Grade 1 anterolisthesis at L4-5. Spondylosis and degenerative disc disease at L5-S1.  IMPRESSION: 1. Hypermetabolic cavitary left upper lobe mass, with scattered upper normal to mildly enlarged, mildly hypermetabolic lymph nodes in the neck, chest, abdomen, and pelvis. The nodal pattern would be unusual for metastatic lung cancer, and there is also some faintly accentuated metabolic activity in the otherwise normal size spleen. The splenic appearance and nodes raise the possibility of lymphoma. The left upper lobe mass could be cavitary lung cancer (favoring squamous cell or small-cell) or potentially a granulomatous infectious process such as tuberculosis. This is not an overall common pattern for sarcoidosis. I would suggest tissue diagnosis of the lung mass and one of the more easily accessible lymph nodes. 2. There is some  accentuated activity in the tongue and palatine tonsils without well-defined CT corollary, probably physiologic. 3. Other imaging findings of potential clinical significance: Aortic Atherosclerosis (ICD10-I70.0) and Emphysema (ICD10-J43.9). Coronary artery atherosclerosis. Lower lumbar spondylosis and degenerative disc disease.   Electronically Signed   By: Van Clines M.D.   On: 01/24/2017 15:50 I personally reviewed the CT and PET/CT images and concur with the findings noted above.  Impression: Calvin Schroeder is a 57 year old man with a history of tobacco abuse who has a complex cystic mass/consolidation in the left upper lobe with bilateral hilar and mediastinal adenopathy.  He also  has adenopathy in his cervical nodes and bilateral axillary nodes.  It is unclear if this represents 1 underlying process or if multiple processes are involved.  The lung lesion and mediastinal and hilar adenopathy are highly suspicious for a new primary bronchogenic carcinoma.  However atypical infections or inflammation are also in the differential diagnosis.  He needs a tissue diagnosis.  I recommended that we proceed with navigational bronchoscopy and endobronchial ultrasound for diagnostic purposes.  The other alternative would be to do CT-guided biopsies.  That would not provide [place] with any information regarding the lymph nodes in the chest.  If the ENB/EBUS is negative CT-guided biopsy would be the next option.  I described the proposed procedure to him.  He understands this is done in the operating room under general anesthesia.  It is an endoscopic procedure with no incisions involved.  He understands the risks include those of general anesthesia.  Those risks include MI, DVT, PE.  There are also procedure specific risks such as bleeding and pneumothorax.  He understands there is a possibility that we might not be able to make a definitive diagnosis.  I offered him the option of proceeding on Friday,  January 25, but he is unable to do so.  He wishes to proceed on Friday, February 1  Emphysema-has severe centrilobular emphysema on CT.  He has not previously been diagnosed.  Tobacco abuse- Smoking cessation instruction/counseling given:  counseled patient on the dangers of tobacco use, advised patient to stop smoking, and reviewed strategies to maximize success.  He does not seem interested in quitting at this time.  Plan: Navigational bronchoscopy and endobronchial ultrasound on Friday, 02/07/2017  [name], MD [facility] [phone]

## 2017-01-29 ENCOUNTER — Other Ambulatory Visit: Payer: Self-pay | Admitting: *Deleted

## 2017-01-29 ENCOUNTER — Telehealth: Payer: Self-pay | Admitting: Oncology

## 2017-01-29 DIAGNOSIS — J984 Other disorders of lung: Secondary | ICD-10-CM

## 2017-01-29 DIAGNOSIS — R590 Localized enlarged lymph nodes: Secondary | ICD-10-CM

## 2017-01-29 NOTE — Telephone Encounter (Signed)
Scheduled appt per 1/21 los- Patient is aware of appt time and date.

## 2017-02-04 NOTE — Pre-Procedure Instructions (Signed)
TELFORD ARCHAMBEAU  02/04/2017      [facility] [address] [place], [place] [address] Phone: [phone] Fax: [phone]    Your procedure is scheduled on Fri. Feb. 1  Report to [facility] Admitting at 5:30 A.M.  Call this number if you have problems the morning of surgery:  [phone]   Remember:  Do not eat food or drink liquids after midnight on Thurs. Jan. 31   Take these medicines the morning of surgery with A SIP OF WATER : none               7 days prior to surgery STOP taking any Aspirin(unless otherwise instructed by your surgeon), Aleve, Naproxen, Ibuprofen, Motrin, Advil, Goody's, BC's, all herbal medications, fish oil, and all vitamins   Do not wear jewelry.  Do not wear lotions, powders, or perfumes, or deodorant.  Do not shave 48 hours prior to surgery.  Men may shave face and neck.  Do not bring valuables to the hospital.  [facility] is not responsible for any belongings or valuables.  Contacts, dentures or bridgework may not be worn into surgery.  Leave your suitcase in the car.  After surgery it may be brought to your room.  For patients admitted to the hospital, discharge time will be determined by your treatment team.  Patients discharged the day of surgery will not be allowed to drive home.   Special instructions:   [place]- Preparing For Surgery  Before surgery, you can play an important role. Because skin is not sterile, your skin needs to be as free of germs as possible. You can reduce the number of germs on your skin by washing with CHG (chlorahexidine gluconate) Soap before surgery.  CHG is an antiseptic cleaner which kills germs and bonds with the skin to continue killing germs even after washing.  Please do not use if you have an allergy to CHG or antibacterial soaps. If your skin becomes reddened/irritated stop using the CHG.  Do not shave (including legs  and underarms) for at least 48 hours prior to first CHG shower. It is OK to shave your face.  Please follow these instructions carefully.   1. Shower the NIGHT BEFORE SURGERY and the MORNING OF SURGERY with CHG.   2. If you chose to wash your hair, wash your hair first as usual with your normal shampoo.  3. After you shampoo, rinse your hair and body thoroughly to remove the shampoo.  4. Use CHG as you would any other liquid soap. You can apply CHG directly to the skin and wash gently with a scrungie or a clean washcloth.   5. Apply the CHG Soap to your body ONLY FROM THE NECK DOWN.  Do not use on open wounds or open sores. Avoid contact with your eyes, ears, mouth and genitals (private parts). Wash Face and genitals (private parts)  with your normal soap.  6. Wash thoroughly, paying special attention to the area where your surgery will be performed.  7. Thoroughly rinse your body with warm water from the neck down.  8. DO NOT shower/wash with your normal soap after using and rinsing off the CHG Soap.  9. Pat yourself dry with a CLEAN TOWEL.  10. Wear CLEAN PAJAMAS to bed the night before surgery, wear comfortable clothes the morning of surgery  11. Place CLEAN SHEETS on your bed the night of your first  shower and DO NOT SLEEP WITH PETS.    Day of Surgery: Do not apply any deodorants/lotions. Please wear clean clothes to the hospital/surgery center.      Please read over the following fact sheets that you were given. Coughing and Deep Breathing

## 2017-02-05 ENCOUNTER — Other Ambulatory Visit: Payer: Self-pay

## 2017-02-05 ENCOUNTER — Ambulatory Visit (HOSPITAL_COMMUNITY)
Admission: RE | Admit: 2017-02-05 | Discharge: 2017-02-05 | Disposition: A | Discharge status: Home / Self Care | Payer: Self-pay | Source: Ambulatory Visit | Attending: Thoracic Surgery (Cardiothoracic Vascular Surgery) | Admitting: Thoracic Surgery (Cardiothoracic Vascular Surgery)

## 2017-02-05 ENCOUNTER — Encounter (HOSPITAL_COMMUNITY)
Admission: RE | Admit: 2017-02-05 | Discharge: 2017-02-05 | Disposition: A | Discharge status: Home / Self Care | Payer: Self-pay | Source: Ambulatory Visit | Attending: Thoracic Surgery (Cardiothoracic Vascular Surgery) | Admitting: Thoracic Surgery (Cardiothoracic Vascular Surgery)

## 2017-02-05 ENCOUNTER — Encounter (HOSPITAL_COMMUNITY): Payer: Self-pay

## 2017-02-05 DIAGNOSIS — J984 Other disorders of lung: Secondary | ICD-10-CM

## 2017-02-05 DIAGNOSIS — R918 Other nonspecific abnormal finding of lung field: Secondary | ICD-10-CM | POA: Insufficient documentation

## 2017-02-05 DIAGNOSIS — J439 Emphysema, unspecified: Secondary | ICD-10-CM | POA: Insufficient documentation

## 2017-02-05 DIAGNOSIS — R590 Localized enlarged lymph nodes: Secondary | ICD-10-CM | POA: Insufficient documentation

## 2017-02-05 DIAGNOSIS — Z01818 Encounter for other preprocedural examination: Secondary | ICD-10-CM | POA: Insufficient documentation

## 2017-02-05 DIAGNOSIS — Z01812 Encounter for preprocedural laboratory examination: Secondary | ICD-10-CM | POA: Insufficient documentation

## 2017-02-05 HISTORY — DX: Dyspnea, unspecified: R06.00

## 2017-02-05 HISTORY — DX: Pneumonia, unspecified organism: J18.9

## 2017-02-05 HISTORY — DX: Chronic obstructive pulmonary disease, unspecified: J44.9

## 2017-02-05 LAB — COMPREHENSIVE METABOLIC PANEL
ALT: 29 U/L (ref 17–63)
ANION GAP: 8 (ref 5–15)
AST: 34 U/L (ref 15–41)
Albumin: 3.1 g/dL — ABNORMAL LOW (ref 3.5–5.0)
Alkaline Phosphatase: 91 U/L (ref 38–126)
BUN: 12 mg/dL (ref 6–20)
CHLORIDE: 105 mmol/L (ref 101–111)
CO2: 23 mmol/L (ref 22–32)
Calcium: 8.8 mg/dL — ABNORMAL LOW (ref 8.9–10.3)
Creatinine, Ser: 0.62 mg/dL (ref 0.61–1.24)
Glucose, Bld: 97 mg/dL (ref 65–99)
POTASSIUM: 4.4 mmol/L (ref 3.5–5.1)
SODIUM: 136 mmol/L (ref 135–145)
Total Bilirubin: 0.6 mg/dL (ref 0.3–1.2)
Total Protein: 9.5 g/dL — ABNORMAL HIGH (ref 6.5–8.1)

## 2017-02-05 LAB — CBC
HCT: 43.9 % (ref 39.0–52.0)
Hemoglobin: 15.6 g/dL (ref 13.0–17.0)
MCH: 35.6 pg — AB (ref 26.0–34.0)
MCHC: 35.5 g/dL (ref 30.0–36.0)
MCV: 100.2 fL — AB (ref 78.0–100.0)
Platelets: 105 10*3/uL — ABNORMAL LOW (ref 150–400)
RBC: 4.38 MIL/uL (ref 4.22–5.81)
RDW: 14.6 % (ref 11.5–15.5)
WBC: 3 10*3/uL — ABNORMAL LOW (ref 4.0–10.5)

## 2017-02-05 LAB — PROTIME-INR
INR: 1.1
PROTHROMBIN TIME: 14.1 s (ref 11.4–15.2)

## 2017-02-05 LAB — APTT: aPTT: 33 seconds (ref 24–36)

## 2017-02-05 NOTE — Progress Notes (Signed)
Anesthesia Chart Review:  Pt is a 57 year old male scheduled for video bronchoscopy with endobronchial navigation and endobronchial ultrasound on 02/07/2017 wit Modesto Charon, MD  - No PCP listed - Oncologist is Francie Massing, MD  PMH includes:  COPD, lung mass. Current smoker. BMI 18.5  Medication: ASA 81mg   BP 123/81   Pulse 76   Temp 36.8 C   Resp 20   Ht 5\' 9"  (1.753 m)   Wt 125 lb 4.8 oz (56.8 kg)   SpO2 98%   BMI 18.50 kg/m   Preoperative labs reviewed.   - WBC 3.0 - Platelets 105 (consistent with recent prior results)  CXR 02/05/17:  1. Persistent cavitary lesion in the left upper lobe with slightly decreased adjacent irregular soft tissue density. This probably represents reduced inflammation. 2. Emphysema.  CT 12/24/16:  1. No substantial change in the nearly 5 cm cystic lesion in the left apex. Interval lack of change raises concern for neoplasm. PET-CT may be warranted to further evaluate.  EKG 11/24/16: Sinus rhythm. Probable left atrial enlargement. Nonspecific T abnrm, anterolateral leads  If no changes, I anticipate pt can proceed with surgery as scheduled.   Willeen Cass, FNP-BC [facility]/Anesthesiology Phone: [phone] 02/05/2017 3:47 PM

## 2017-02-07 ENCOUNTER — Ambulatory Visit (HOSPITAL_COMMUNITY): Payer: Self-pay | Admitting: Anesthesiology

## 2017-02-07 ENCOUNTER — Ambulatory Visit (HOSPITAL_COMMUNITY): Payer: Self-pay

## 2017-02-07 ENCOUNTER — Ambulatory Visit (HOSPITAL_COMMUNITY): Payer: Self-pay | Admitting: Emergency Medicine

## 2017-02-07 ENCOUNTER — Ambulatory Visit (HOSPITAL_COMMUNITY)
Admission: RE | Admit: 2017-02-07 | Discharge: 2017-02-07 | Disposition: A | Discharge status: Home / Self Care | Payer: Self-pay | Source: Ambulatory Visit | Attending: Thoracic Surgery (Cardiothoracic Vascular Surgery) | Admitting: Thoracic Surgery (Cardiothoracic Vascular Surgery)

## 2017-02-07 ENCOUNTER — Encounter (HOSPITAL_COMMUNITY)
Admission: RE | Disposition: A | Discharge status: Home / Self Care | Payer: Self-pay | Source: Ambulatory Visit | Attending: Thoracic Surgery (Cardiothoracic Vascular Surgery)

## 2017-02-07 DIAGNOSIS — F17210 Nicotine dependence, cigarettes, uncomplicated: Secondary | ICD-10-CM | POA: Insufficient documentation

## 2017-02-07 DIAGNOSIS — Z885 Allergy status to narcotic agent status: Secondary | ICD-10-CM | POA: Insufficient documentation

## 2017-02-07 DIAGNOSIS — F1290 Cannabis use, unspecified, uncomplicated: Secondary | ICD-10-CM | POA: Insufficient documentation

## 2017-02-07 DIAGNOSIS — D869 Sarcoidosis, unspecified: Secondary | ICD-10-CM | POA: Insufficient documentation

## 2017-02-07 DIAGNOSIS — R918 Other nonspecific abnormal finding of lung field: Secondary | ICD-10-CM

## 2017-02-07 DIAGNOSIS — I2510 Atherosclerotic heart disease of native coronary artery without angina pectoris: Secondary | ICD-10-CM | POA: Insufficient documentation

## 2017-02-07 DIAGNOSIS — R590 Localized enlarged lymph nodes: Secondary | ICD-10-CM | POA: Insufficient documentation

## 2017-02-07 DIAGNOSIS — Z7982 Long term (current) use of aspirin: Secondary | ICD-10-CM | POA: Insufficient documentation

## 2017-02-07 DIAGNOSIS — Z419 Encounter for procedure for purposes other than remedying health state, unspecified: Secondary | ICD-10-CM

## 2017-02-07 DIAGNOSIS — J432 Centrilobular emphysema: Secondary | ICD-10-CM | POA: Insufficient documentation

## 2017-02-07 DIAGNOSIS — J984 Other disorders of lung: Secondary | ICD-10-CM

## 2017-02-07 DIAGNOSIS — M5136 Other intervertebral disc degeneration, lumbar region: Secondary | ICD-10-CM | POA: Insufficient documentation

## 2017-02-07 HISTORY — PX: VIDEO BRONCHOSCOPY WITH ENDOBRONCHIAL NAVIGATION: SHX6175

## 2017-02-07 HISTORY — PX: VIDEO BRONCHOSCOPY WITH ENDOBRONCHIAL ULTRASOUND: SHX6177

## 2017-02-07 SURGERY — VIDEO BRONCHOSCOPY WITH ENDOBRONCHIAL NAVIGATION
Anesthesia: General

## 2017-02-07 MED ORDER — PHENYLEPHRINE HCL 10 MG/ML IJ SOLN
INTRAVENOUS | Status: DC | PRN
  Administered 2017-02-07: 25 ug/min via INTRAVENOUS

## 2017-02-07 MED ORDER — MIDAZOLAM HCL 2 MG/2ML IJ SOLN
INTRAMUSCULAR | Status: AC
  Filled 2017-02-07: qty 2

## 2017-02-07 MED ORDER — DEXAMETHASONE SODIUM PHOSPHATE 10 MG/ML IJ SOLN
INTRAMUSCULAR | Status: DC | PRN
  Administered 2017-02-07: 10 mg via INTRAVENOUS

## 2017-02-07 MED ORDER — ROCURONIUM BROMIDE 100 MG/10ML IV SOLN
INTRAVENOUS | Status: DC | PRN
  Administered 2017-02-07 (×2): 10 mg via INTRAVENOUS
  Administered 2017-02-07: 20 mg via INTRAVENOUS
  Administered 2017-02-07: 50 mg via INTRAVENOUS

## 2017-02-07 MED ORDER — PROMETHAZINE HCL 25 MG/ML IJ SOLN
6.2500 mg | INTRAMUSCULAR | Status: DC | PRN
Start: 2017-02-07 — End: 2017-02-07

## 2017-02-07 MED ORDER — ROCURONIUM BROMIDE 10 MG/ML (PF) SYRINGE
PREFILLED_SYRINGE | INTRAVENOUS | Status: AC
  Filled 2017-02-07: qty 5

## 2017-02-07 MED ORDER — ONDANSETRON HCL 4 MG/2ML IJ SOLN
INTRAMUSCULAR | Status: AC
  Filled 2017-02-07: qty 4

## 2017-02-07 MED ORDER — PROPOFOL 10 MG/ML IV BOLUS
INTRAVENOUS | Status: DC | PRN
  Administered 2017-02-07: 140 mg via INTRAVENOUS

## 2017-02-07 MED ORDER — DEXTROSE 5 % IV SOLN
1.5000 g | INTRAVENOUS | Status: AC
  Administered 2017-02-07: 1.5 g via INTRAVENOUS
  Filled 2017-02-07: qty 1.5

## 2017-02-07 MED ORDER — LACTATED RINGERS IV SOLN
INTRAVENOUS | Status: DC | PRN
  Administered 2017-02-07 (×2): via INTRAVENOUS

## 2017-02-07 MED ORDER — LACTATED RINGERS IV SOLN: INTRAVENOUS | Status: DC

## 2017-02-07 MED ORDER — SUGAMMADEX SODIUM 200 MG/2ML IV SOLN
INTRAVENOUS | Status: AC
  Filled 2017-02-07: qty 2

## 2017-02-07 MED ORDER — PROPOFOL 10 MG/ML IV BOLUS
INTRAVENOUS | Status: AC
  Filled 2017-02-07: qty 20

## 2017-02-07 MED ORDER — 0.9 % SODIUM CHLORIDE (POUR BTL) OPTIME
TOPICAL | Status: DC | PRN
  Administered 2017-02-07: 1000 mL

## 2017-02-07 MED ORDER — LIDOCAINE HCL (CARDIAC) 20 MG/ML IV SOLN
INTRAVENOUS | Status: DC | PRN
  Administered 2017-02-07: 80 mg via INTRAVENOUS

## 2017-02-07 MED ORDER — FENTANYL CITRATE (PF) 100 MCG/2ML IJ SOLN: 25.0000 ug | INTRAMUSCULAR | Status: DC | PRN

## 2017-02-07 MED ORDER — LIDOCAINE 2% (20 MG/ML) 5 ML SYRINGE
INTRAMUSCULAR | Status: AC
  Filled 2017-02-07: qty 10

## 2017-02-07 MED ORDER — ONDANSETRON HCL 4 MG/2ML IJ SOLN
INTRAMUSCULAR | Status: DC | PRN
  Administered 2017-02-07: 4 mg via INTRAVENOUS

## 2017-02-07 MED ORDER — MEPERIDINE HCL 25 MG/ML IJ SOLN: 6.2500 mg | INTRAMUSCULAR | Status: DC | PRN

## 2017-02-07 MED ORDER — MIDAZOLAM HCL 5 MG/5ML IJ SOLN
INTRAMUSCULAR | Status: DC | PRN
  Administered 2017-02-07: 2 mg via INTRAVENOUS

## 2017-02-07 MED ORDER — FENTANYL CITRATE (PF) 250 MCG/5ML IJ SOLN
INTRAMUSCULAR | Status: AC
  Filled 2017-02-07: qty 5

## 2017-02-07 MED ORDER — EPINEPHRINE PF 1 MG/ML IJ SOLN
INTRAMUSCULAR | Status: AC
  Filled 2017-02-07: qty 1

## 2017-02-07 MED ORDER — SUGAMMADEX SODIUM 200 MG/2ML IV SOLN
INTRAVENOUS | Status: DC | PRN
  Administered 2017-02-07: 220 mg via INTRAVENOUS

## 2017-02-07 MED ORDER — FENTANYL CITRATE (PF) 100 MCG/2ML IJ SOLN
INTRAMUSCULAR | Status: DC | PRN
  Administered 2017-02-07 (×3): 50 ug via INTRAVENOUS
  Administered 2017-02-07: 100 ug via INTRAVENOUS

## 2017-02-07 MED ORDER — DEXAMETHASONE SODIUM PHOSPHATE 10 MG/ML IJ SOLN
INTRAMUSCULAR | Status: AC
  Filled 2017-02-07: qty 3

## 2017-02-07 SURGICAL SUPPLY — 50 items
ADAPTER BRONCH F/PENTAX (ADAPTER) ×3 IMPLANT
ADPR BSCP EDG PNTX (ADAPTER) ×1
BRUSH BIOPSY BRONCH 10 SDTNB (MISCELLANEOUS) IMPLANT
BRUSH BIOPSY BRONCH 10MM SDTNB (MISCELLANEOUS)
BRUSH CYTOL CELLEBRITY 1.5X140 (MISCELLANEOUS) IMPLANT
BRUSH SUPERTRAX BIOPSY (INSTRUMENTS) IMPLANT
BRUSH SUPERTRAX NDL-TIP CYTO (INSTRUMENTS) ×3 IMPLANT
CANISTER SUCT 3000ML PPV (MISCELLANEOUS) ×6 IMPLANT
CHANNEL WORK EXTEND EDGE 180 (KITS) IMPLANT
CHANNEL WORK EXTEND EDGE 45 (KITS) IMPLANT
CHANNEL WORK EXTEND EDGE 90 (KITS) IMPLANT
CONT SPEC 4OZ CLIKSEAL STRL BL (MISCELLANEOUS) ×9 IMPLANT
COVER BACK TABLE 60X90IN (DRAPES) ×6 IMPLANT
COVER DOME SNAP 22 D (MISCELLANEOUS) ×3 IMPLANT
FILTER STRAW FLUID ASPIR (MISCELLANEOUS) IMPLANT
FORCEPS BIOP RJ4 1.8 (CUTTING FORCEPS) ×3 IMPLANT
FORCEPS BIOP SUPERTRX PREMAR (INSTRUMENTS) ×3 IMPLANT
FORCEPS RADIAL JAW LRG 4 PULM (INSTRUMENTS) IMPLANT
GAUZE SPONGE 4X4 12PLY STRL (GAUZE/BANDAGES/DRESSINGS) ×3 IMPLANT
GLOVE SURG SIGNA 7.5 PF LTX (GLOVE) ×6 IMPLANT
GOWN STRL REUS W/ TWL XL LVL3 (GOWN DISPOSABLE) ×1 IMPLANT
GOWN STRL REUS W/TWL XL LVL3 (GOWN DISPOSABLE) ×3
KIT CLEAN ENDO COMPLIANCE (KITS) ×6 IMPLANT
KIT PROCEDURE EDGE 180 (KITS) ×3 IMPLANT
KIT PROCEDURE EDGE 45 (KITS) IMPLANT
KIT PROCEDURE EDGE 90 (KITS) IMPLANT
KIT ROOM TURNOVER OR (KITS) ×6 IMPLANT
MARKER SKIN DUAL TIP RULER LAB (MISCELLANEOUS) ×6 IMPLANT
NEEDLE BLUNT 18X1 FOR OR ONLY (NEEDLE) IMPLANT
NEEDLE ECHOTIP HI DEF 22GA (NEEDLE) ×3 IMPLANT
NEEDLE SUPERTRX PREMARK BIOPSY (NEEDLE) ×3 IMPLANT
NS IRRIG 1000ML POUR BTL (IV SOLUTION) ×3 IMPLANT
OIL SILICONE PENTAX (PARTS (SERVICE/REPAIRS)) ×3 IMPLANT
PAD ARMBOARD 7.5X6 YLW CONV (MISCELLANEOUS) ×12 IMPLANT
PATCHES PATIENT (LABEL) ×9 IMPLANT
RADIAL JAW LRG 4 PULMONARY (INSTRUMENTS)
SYR 20CC LL (SYRINGE) ×6 IMPLANT
SYR 20ML ECCENTRIC (SYRINGE) ×3 IMPLANT
SYR 30ML LL (SYRINGE) ×3 IMPLANT
SYR 3ML LL SCALE MARK (SYRINGE) IMPLANT
SYR 5ML LL (SYRINGE) IMPLANT
SYR 5ML LUER SLIP (SYRINGE) IMPLANT
SYSTEM GENCUT CORE BIOPSY (NEEDLE) ×3 IMPLANT
TOWEL GREEN STERILE (TOWEL DISPOSABLE) ×3 IMPLANT
TOWEL GREEN STERILE FF (TOWEL DISPOSABLE) ×3 IMPLANT
TRAP SPECIMEN MUCOUS 40CC (MISCELLANEOUS) ×6 IMPLANT
TUBE CONNECTING 20'X1/4 (TUBING) ×2
TUBE CONNECTING 20X1/4 (TUBING) ×4 IMPLANT
UNDERPAD 30X30 (UNDERPADS AND DIAPERS) ×3 IMPLANT
WATER STERILE IRR 1000ML POUR (IV SOLUTION) ×3 IMPLANT

## 2017-02-07 NOTE — Transfer of Care (Signed)
Immediate Anesthesia Transfer of Care Note  Patient: Calvin Schroeder  Procedure(s) Performed: VIDEO BRONCHOSCOPY WITH ENDOBRONCHIAL NAVIGATION (N/A ) VIDEO BRONCHOSCOPY WITH ENDOBRONCHIAL ULTRASOUND (N/A )  Patient Location: PACU  Anesthesia Type:General  Level of Consciousness: awake, alert  and oriented  Airway & Oxygen Therapy: Patient Spontanous Breathing and Patient connected to face mask oxygen  Post-op Assessment: Report given to RN, Post -op Vital signs reviewed and stable and Patient moving all extremities X 4  Post vital signs: Reviewed and stable  Last Vitals:  Vitals:   02/07/17 0544  BP: 136/82  Pulse: 71  Resp: 20  Temp: (!) 36.4 C  SpO2: 100%    Last Pain:  Vitals:   02/07/17 0635  TempSrc:   PainSc: 0-No pain      Patients Stated Pain Goal: 3 (50/15/86 8257)  Complications: No apparent anesthesia complications

## 2017-02-07 NOTE — Interval H&P Note (Signed)
History and Physical Interval Note:  02/07/2017 7:17 AM  Calvin Schroeder  has presented today for surgery, with the diagnosis of LUL MASS MEDIASTINAL ADENOPATHY  The various methods of treatment have been discussed with the patient and family. After consideration of risks, benefits and other options for treatment, the patient has consented to  Procedure(s): VIDEO BRONCHOSCOPY WITH ENDOBRONCHIAL NAVIGATION (N/A) VIDEO BRONCHOSCOPY WITH ENDOBRONCHIAL ULTRASOUND (N/A) as a surgical intervention .  The patient's history has been reviewed, patient examined, no change in status, stable for surgery.  I have reviewed the patient's chart and labs.  Questions were answered to the patient's satisfaction.     [name]

## 2017-02-07 NOTE — Anesthesia Procedure Notes (Signed)
Procedure Name: Intubation Date/Time: 02/07/2017 7:33 AM Performed by: Neldon Newport, CRNA Pre-anesthesia Checklist: Timeout performed, Patient being monitored, Suction available, Emergency Drugs available and Patient identified Patient Re-evaluated:Patient Re-evaluated prior to induction Oxygen Delivery Method: Circle system utilized Preoxygenation: Pre-oxygenation with 100% oxygen Induction Type: IV induction Ventilation: Mask ventilation without difficulty Laryngoscope Size: Mac and 3 Grade View: Grade I Tube type: Oral Tube size: 8.5 mm Number of attempts: 1 Placement Confirmation: breath sounds checked- equal and bilateral,  positive ETCO2 and ETT inserted through vocal cords under direct vision Secured at: 23 cm Tube secured with: Tape Dental Injury: Teeth and Oropharynx as per pre-operative assessment

## 2017-02-07 NOTE — Anesthesia Postprocedure Evaluation (Signed)
Anesthesia Post Note  Patient: Calvin Schroeder  Procedure(s) Performed: VIDEO BRONCHOSCOPY WITH ENDOBRONCHIAL NAVIGATION (N/A ) VIDEO BRONCHOSCOPY WITH ENDOBRONCHIAL ULTRASOUND (N/A )     Patient location during evaluation: PACU Anesthesia Type: General Level of consciousness: awake and alert Pain management: pain level controlled Vital Signs Assessment: post-procedure vital signs reviewed and stable Respiratory status: spontaneous breathing, nonlabored ventilation, respiratory function stable and patient connected to nasal cannula oxygen Cardiovascular status: blood pressure returned to baseline and stable Postop Assessment: no apparent nausea or vomiting Anesthetic complications: no    Last Vitals:  Vitals:   02/07/17 1019 02/07/17 1030  BP: 115/68   Pulse: 95 86  Resp: (!) 23 15  Temp:  36.7 C  SpO2: 95% 92%    Last Pain:  Vitals:   02/07/17 0635  TempSrc:   PainSc: 0-No pain                 Effie Berkshire

## 2017-02-07 NOTE — Anesthesia Preprocedure Evaluation (Addendum)
Anesthesia Evaluation  Patient identified by MRN, date of birth, ID band Patient awake    Reviewed: Allergy & Precautions, NPO status , Patient's Chart, lab work & pertinent test results  Airway Mallampati: I  TM Distance: >3 FB Neck ROM: Full    Dental  (+) Missing, Dental Advidsory Given, Poor Dentition, Loose,    Pulmonary shortness of breath, COPD, Current Smoker,     + decreased breath sounds      Cardiovascular  Rhythm:Regular Rate:Normal     Neuro/Psych    GI/Hepatic negative GI ROS, Neg liver ROS,   Endo/Other  negative endocrine ROS  Renal/GU negative Renal ROS     Musculoskeletal negative musculoskeletal ROS (+)   Abdominal Normal abdominal exam  (+)   Peds  Hematology negative hematology ROS (+)   Anesthesia Other Findings   Reproductive/Obstetrics                           Lab Results  Component Value Date   WBC 3.0 (L) 02/05/2017   HGB 15.6 02/05/2017   HCT 43.9 02/05/2017   MCV 100.2 (H) 02/05/2017   PLT 105 (L) 02/05/2017   Lab Results  Component Value Date   CREATININE 0.62 02/05/2017   BUN 12 02/05/2017   NA 136 02/05/2017   K 4.4 02/05/2017   CL 105 02/05/2017   CO2 23 02/05/2017   Lab Results  Component Value Date   INR 1.10 02/05/2017   EKG: normal sinus rhythm.  Anesthesia Physical Anesthesia Plan  ASA: III  Anesthesia Plan: General   Post-op Pain Management:    Induction: Intravenous  PONV Risk Score and Plan: 2 and Dexamethasone and Ondansetron  Airway Management Planned: Oral ETT  Additional Equipment: None  Intra-op Plan:   Post-operative Plan: Extubation in OR  Informed Consent: I have reviewed the patients History and Physical, chart, labs and discussed the procedure including the risks, benefits and alternatives for the proposed anesthesia with the patient or authorized representative who has indicated his/her understanding and  acceptance.   Dental Advisory Given  Plan Discussed with: Anesthesiologist, CRNA and Surgeon  Anesthesia Plan Comments:       Anesthesia Quick Evaluation

## 2017-02-07 NOTE — Op Note (Signed)
Calvin Schroeder, Calvin Schroeder              ACCOUNT NO.:  [id number]  MEDICAL RECORD NO.:  [id number]  LOCATION:  [facility]                         FACILITY:  [facility]  PHYSICIAN:  Revonda Standard. [name], M.D.DATE OF BIRTH:  Dec 07, 1960  DATE OF PROCEDURE:  02/07/2017 DATE OF DISCHARGE:                              OPERATIVE REPORT   PREOPERATIVE DIAGNOSES:  Left upper lobe cavitary mass and mediastinal adenopathy.  POSTOPERATIVE DIAGNOSIS:  Left upper lobe cavitary mass and mediastinal adenopathy.  PROCEDURES:   Endobronchial ultrasound with mediastinal lymph node aspirations. Electromagnetic navigational bronchoscopy with needle aspirations, brushings, transbronchial biopsies, and GenCut aspirations.  SURGEON:  Revonda Standard. [name], MD.  ASSISTANT:  None.  ANESTHESIA:  General.  FINDINGS:  Lymph node aspirations- no tumor seen, lymphoid cells present.  Brushings and needle aspirations of lung nodule showed a few atypical cells and also giant cells and neutrophils.  CLINICAL NOTE:  Calvin Schroeder is a 57 year old man with a history of heavy tobacco abuse, who recently became ill after a cruise to the [place]. Workup showed a consolidation in the left upper lobe.  A repeat CT showed persistence of the left upper lobe opacity.  PET-CT showed hypermetabolism in the area.  There also was extensive hypermetabolic activity in the mediastinum, hilum, and bilateral axillary lymph nodes. He was advised to undergo navigational bronchoscopy and endobronchial ultrasound for diagnostic purposes.  The indications, risks, benefits, and alternatives were discussed in detail with the patient.  He understood and accepted the risks and agreed to proceed.  He did understand that there was no guarantee of a definitive diagnosis.  OPERATIVE NOTE:  Calvin Schroeder was brought to the operating room on February 07, 2017.  He had induction of general anesthesia.  Intravenous antibiotics were administered.  Flexible  fiberoptic bronchoscopy was performed that revealed normal endobronchial anatomy with no endobronchial lesions.  There were minimal clear secretions.  The endobronchial ultrasound probe was advanced and systematic inspection of the mediastinal lymph node stations was carried out. Nodes were found in the 4R, 4L, and 7 stations.  There were multiple nodes in the 4L and 7 stations that were sampled.  Needle aspirations then were performed of each of these nodes.  With each aspiration, the needle was advanced into the lymph node with ultrasound visualization and 12-15 passes were made with the needle in the node.  The majority of the sampling was done without suction, but a few samples were done with suction applied.  After each node was aspirated, the specimen was sent for quick prep.  Parts of each specimen were placed into cytologic preparation fluid for cell block.  All of the aspirations returned showing lymphoid cells, but no tumor cells.  The bronchoscope then was again inserted.  The locatable guide for navigation was placed.  Registration was performed.  There was good correlation of the video and virtual bronchoscopy.  The bronchoscope then was advanced to the takeoff of the left upper lobe bronchus.  The appropriate segmental bronchus was cannulated and the catheter was advanced to within 6 mm of the center of the lesion.  Fluoroscopy was used for all sampling of the lung nodule.  A total of 5.2 minutes of fluoroscopy  time was used.  Sampling was begun with needle aspirations. Again, part of the specimen was applied to slides and the remainder was placed into the cell block.  Needle brushings then were performed and these specimens were sent to pathology for immediate examination.  While awaiting those results, over 10 biopsies were taken in total.  Some were sent for AFB and fungal cultures and the majority were sent for permanent pathology.  The catheter was repositioned to a  second spot in the nodule and additional biopsies were obtained from that site.  The catheter then was repositioned back to the primary target on the nodule and aspirations were performed with a GenCut needle.  The entirety of this specimen was placed into cytologic preparation fluid.  Finally, a bronchoalveolar lavage was performed instilling 100 mL of saline and aspirating as the catheter was withdrawn.  There was return of approximately 25 mL of saline.  A final inspection was made with the bronchoscope and there was no ongoing bleeding.  The bronchoscope was removed.  The patient was extubated in the operating room and taken to the postanesthetic care unit in good condition.     Revonda Standard [name], M.D.     SCH/MEDQ  D:  02/07/2017  T:  02/07/2017  Job:  [id number]

## 2017-02-07 NOTE — Brief Op Note (Signed)
02/07/2017  10:13 AM  PATIENT:  Calvin Schroeder  57 y.o. male  PRE-OPERATIVE DIAGNOSIS:  LUL MASS MEDIASTINAL ADENOPATHY  POST-OPERATIVE DIAGNOSIS:  LUL MASS MEDIASTINAL ADENOPATHY  PROCEDURE:  Procedure(s): VIDEO BRONCHOSCOPY WITH ENDOBRONCHIAL NAVIGATION (N/A) VIDEO BRONCHOSCOPY WITH ENDOBRONCHIAL ULTRASOUND (N/A) Needle aspirations, brushings, transbronchial biopsies and Gen Cut aspirations  SURGEON:  Surgeon(s) and Role:    * [name], MD - Primary  PHYSICIAN ASSISTANT:   ASSISTANTS: none   ANESTHESIA:   general  EBL:  0.8 mL   BLOOD ADMINISTERED:none  DRAINS: none   LOCAL MEDICATIONS USED:  NONE  SPECIMEN:  Source of Specimen:  Mediastinal nodes, LUL mass  DISPOSITION OF SPECIMEN:  Path and Micro  COUNTS:  NO endoscopic  TOURNIQUET:  * No tourniquets in log *  DICTATION: .Other Dictation: Dictation Number -  PLAN OF CARE: Discharge to home after PACU  PATIENT DISPOSITION:  PACU - hemodynamically stable.   Delay start of Pharmacological VTE agent (>24hrs) due to surgical blood loss or risk of bleeding: not applicable

## 2017-02-07 NOTE — OR Nursing (Signed)
Specimen J to be converted to Specimen K by Lab

## 2017-02-07 NOTE — Discharge Instructions (Addendum)
Do not drive or engage in heavy physical activity for 24 hours  You may resume normal activities tomorrow  You may cough up small amounts of blood over the next few days. Call if you cough up more than 2 tablespoons of blood  You may use acetaminophen (Tylenol) if needed for discomfort  Call [phone] if you develop chest pain, shortness of breath or fever > 101 F  My office will contact you with a follow up appointment

## 2017-02-07 NOTE — Anesthesia Postprocedure Evaluation (Signed)
Anesthesia Post Note  Patient: JAHMEL FLANNAGAN  Procedure(s) Performed: VIDEO BRONCHOSCOPY WITH ENDOBRONCHIAL NAVIGATION (N/A ) VIDEO BRONCHOSCOPY WITH ENDOBRONCHIAL ULTRASOUND (N/A )     Patient location during evaluation: PACU Anesthesia Type: General Level of consciousness: awake and alert Pain management: pain level controlled Vital Signs Assessment: post-procedure vital signs reviewed and stable Respiratory status: spontaneous breathing, nonlabored ventilation, respiratory function stable and patient connected to nasal cannula oxygen Cardiovascular status: blood pressure returned to baseline and stable Postop Assessment: no apparent nausea or vomiting Anesthetic complications: no    Last Vitals:  Vitals:   02/07/17 1019 02/07/17 1030  BP: 115/68   Pulse: 95 86  Resp: (!) 23 15  Temp:  36.7 C  SpO2: 95% 92%    Last Pain:  Vitals:   02/07/17 0635  TempSrc:   PainSc: 0-No pain                 Effie Berkshire

## 2017-02-08 ENCOUNTER — Encounter (HOSPITAL_COMMUNITY): Payer: Self-pay | Admitting: Thoracic Surgery (Cardiothoracic Vascular Surgery)

## 2017-02-08 LAB — ACID FAST SMEAR (AFB): ACID FAST SMEAR - AFSCU2: NEGATIVE

## 2017-02-08 LAB — ACID FAST SMEAR (AFB, MYCOBACTERIA)

## 2017-02-12 LAB — AEROBIC/ANAEROBIC CULTURE (SURGICAL/DEEP WOUND): CULTURE: NO GROWTH

## 2017-02-12 LAB — AEROBIC/ANAEROBIC CULTURE W GRAM STAIN (SURGICAL/DEEP WOUND)

## 2017-02-13 ENCOUNTER — Encounter: Payer: Self-pay | Admitting: Thoracic Surgery (Cardiothoracic Vascular Surgery)

## 2017-02-13 ENCOUNTER — Other Ambulatory Visit: Payer: Self-pay

## 2017-02-13 ENCOUNTER — Ambulatory Visit (INDEPENDENT_AMBULATORY_CARE_PROVIDER_SITE_OTHER): Payer: Self-pay | Admitting: Thoracic Surgery (Cardiothoracic Vascular Surgery)

## 2017-02-13 VITALS — BP 121/80 | HR 99 | Resp 18 | Ht 69.0 in | Wt 131.8 lb

## 2017-02-13 DIAGNOSIS — R918 Other nonspecific abnormal finding of lung field: Secondary | ICD-10-CM

## 2017-02-13 DIAGNOSIS — R590 Localized enlarged lymph nodes: Secondary | ICD-10-CM

## 2017-02-13 NOTE — Progress Notes (Signed)
[address]       [place] [address]             [phone]     HPI: Mr. Kimball returns today for a scheduled follow-up after his bronchoscopy and endobronchial ultrasound.  Mr. Schliep is a 57 year old man with a history of tobacco abuse who became ill last fall while on a cruise in the [place].  After arriving back home he had a CT of the chest to rule out a pulmonary embolus.  It demonstrated severe emphysema and a complex consolidative change in the left upper lobe.  A repeat CT in December showed persistence of the left upper lobe opacity.  PET/CT showed the area was hypermetabolic.  He also had hypermetabolic hilar mediastinal and bilateral axillary lymph nodes.  I did navigational bronchoscopy and endobronchial ultrasound on 02/07/2017.  The left upper lobe mass showed chronic inflammation with vague granuloma formation with giant cells.  Stains were negative for organisms.  Lymph node aspirations showed some atypical cells but no tumor was seen.  He tolerated the bronchoscopy well.  He said he coughed up a small amount of blood.  He says he has been having intermittent severe pains all over his body has had headaches and backaches and myalgias.  Past Medical History:  Diagnosis Date  . Back pain   . COPD (chronic obstructive pulmonary disease) (HCC)    emphysema   . Dyspnea    with exertion   . Lung mass 06/10/2013  . Pneumonia    11/2016  . Tobacco abuse 06/10/2013    Current Outpatient Medications  Medication Sig Dispense Refill  . aspirin 81 MG tablet Take 81 mg by mouth every evening.      No current facility-administered medications for this visit.     Physical Exam BP 121/80 (BP Location: Right Arm, Patient Position: Sitting, Cuff Size: Normal)   Pulse 99   Resp 18   Ht 5\' 9"  (1.753 m)   Wt 131 lb 12.8 oz (59.8 kg)   SpO2 97% Comment: RA  BMI 19.55 kg/m  57 year old man in no acute distress Alert and oriented x3 with no focal  deficits Lungs diminished breath sounds bilaterally  Diagnostic Tests: I reviewed the pathology and cytology reports  Impression: Mr. Jefferys is a 56 year old man with a history of tobacco abuse who has a complex cavitary mass in the left upper lobe with extensive bilateral hilar, mediastinal and bilateral axillary lymphadenopathy.  Biopsies of the left upper lobe suggest chronic inflammation and reactive changes with a granuloma formation.  AFB and fungal stains were negative.  I am going to send off a QuantiFERON gold and fungal antibody titers to assess those possibilities.  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  I stressed to Mr. and Mrs. Brar that this does not completely rule out the possibility of cancer.  I would recommend a repeat scan in about 2 months to check on the area once again.  Diffuse adenopathy.  We discussed this at our multidisciplinary thoracic oncology conference this morning.  I think the next best step would be to do a needle biopsy of the right axillary nodes to make sure there were not dealing with lymphoma with the cavitary lung lesion being a red herring.  We will arrange that through interventional radiology.  Plan: QuantiFERON gold and fungal antibodies Ultrasound-guided needle biopsy of right axillary nodes Follow-up with Dr. Julien Nordmann in 2 weeks as scheduled Return in 2 months with  CT of chest to follow-up left upper lobe consolidation  [name], MD [facility] [phone]

## 2017-02-14 ENCOUNTER — Other Ambulatory Visit: Payer: Self-pay | Admitting: *Deleted

## 2017-02-14 DIAGNOSIS — R590 Localized enlarged lymph nodes: Secondary | ICD-10-CM

## 2017-02-19 ENCOUNTER — Other Ambulatory Visit: Payer: Self-pay | Admitting: Radiology

## 2017-02-19 ENCOUNTER — Other Ambulatory Visit: Payer: Self-pay | Admitting: General Surgery

## 2017-02-20 ENCOUNTER — Encounter (HOSPITAL_COMMUNITY): Payer: Self-pay

## 2017-02-20 ENCOUNTER — Ambulatory Visit (HOSPITAL_COMMUNITY)
Admission: RE | Admit: 2017-02-20 | Discharge: 2017-02-20 | Disposition: A | Discharge status: Home / Self Care | Payer: Self-pay | Source: Ambulatory Visit | Attending: Thoracic Surgery (Cardiothoracic Vascular Surgery) | Admitting: Thoracic Surgery (Cardiothoracic Vascular Surgery)

## 2017-02-20 DIAGNOSIS — R590 Localized enlarged lymph nodes: Secondary | ICD-10-CM | POA: Insufficient documentation

## 2017-02-20 LAB — PROTIME-INR
INR: 1.11
Prothrombin Time: 14.2 seconds (ref 11.4–15.2)

## 2017-02-20 LAB — CBC
HEMATOCRIT: 43.3 % (ref 39.0–52.0)
HEMOGLOBIN: 15 g/dL (ref 13.0–17.0)
MCH: 34.6 pg — ABNORMAL HIGH (ref 26.0–34.0)
MCHC: 34.6 g/dL (ref 30.0–36.0)
MCV: 100 fL (ref 78.0–100.0)
Platelets: 132 10*3/uL — ABNORMAL LOW (ref 150–400)
RBC: 4.33 MIL/uL (ref 4.22–5.81)
RDW: 13.9 % (ref 11.5–15.5)
WBC: 3 10*3/uL — AB (ref 4.0–10.5)

## 2017-02-20 MED ORDER — LIDOCAINE HCL (PF) 1 % IJ SOLN
INTRAMUSCULAR | Status: AC
  Filled 2017-02-20: qty 30

## 2017-02-20 MED ORDER — FENTANYL CITRATE (PF) 100 MCG/2ML IJ SOLN
INTRAMUSCULAR | Status: AC
  Filled 2017-02-20: qty 2

## 2017-02-20 MED ORDER — SODIUM CHLORIDE 0.9 % IV SOLN: INTRAVENOUS | Status: DC

## 2017-02-20 MED ORDER — MIDAZOLAM HCL 2 MG/2ML IJ SOLN
INTRAMUSCULAR | Status: AC
  Filled 2017-02-20: qty 2

## 2017-02-20 NOTE — Discharge Instructions (Addendum)

## 2017-02-20 NOTE — Procedures (Signed)
Interventional Radiology Procedure Note  Procedure: US guided lymph node biopsy. Right axillary.  Complications: None Recommendations:  - Ok to shower tomorrow - Do not submerge for 7 days - Routine wound care   Signed,  Dulcy Fanny. Earleen Newport, DO

## 2017-02-27 ENCOUNTER — Encounter: Payer: Self-pay | Admitting: Internal Medicine

## 2017-02-27 ENCOUNTER — Telehealth: Payer: Self-pay | Admitting: Internal Medicine

## 2017-02-27 ENCOUNTER — Inpatient Hospital Stay: Payer: Self-pay | Attending: Oncology | Admitting: Internal Medicine

## 2017-02-27 VITALS — BP 129/79 | HR 91 | Temp 98.9°F | Resp 24 | Ht 70.0 in | Wt 130.7 lb

## 2017-02-27 DIAGNOSIS — R918 Other nonspecific abnormal finding of lung field: Secondary | ICD-10-CM | POA: Insufficient documentation

## 2017-02-27 DIAGNOSIS — R591 Generalized enlarged lymph nodes: Secondary | ICD-10-CM | POA: Insufficient documentation

## 2017-02-27 NOTE — Telephone Encounter (Signed)
No appts to schedule per -Referral to pulmonary medicine No need for follow-up visit with me at this point.- Naplate to contact patient with referral

## 2017-02-27 NOTE — Progress Notes (Signed)
[facility] [phone]   [phone]  OFFICE PROGRESS NOTE  Patient, No Pcp Per No address on file  DIAGNOSIS: Left upper lobe cavitary mass in addition to mediastinal lymphadenopathy.  Highly suspicious to be chronic inflammation.  PRIOR THERAPY: None  CURRENT THERAPY: None  INTERVAL HISTORY: Calvin Schroeder 57 y.o. male returns to the clinic today for follow-up visit accompanied by his wife.  The patient is feeling specific complaints except for mild cough.  He denied having any chest pain but has shortness breath with exertion with no hemoptysis.  He denied having any recent weight loss or night sweats.  He was seen by Dr. [name] and underwent bronchoscopy with endobronchial ultrasound and biopsies that showed chronic inflammatory process with no evidence of malignancy.  He also had ultrasound guided core biopsy of the right axillary lymph node that was unremarkable for lymphoproliferative disorder.  The patient is here today for evaluation and recommendation regarding his condition.  MEDICAL HISTORY: Past Medical History:  Diagnosis Date  . Back pain   . COPD (chronic obstructive pulmonary disease) (HCC)    emphysema   . Dyspnea    with exertion   . Lung mass 06/10/2013  . Pneumonia    11/2016  . Tobacco abuse 06/10/2013    ALLERGIES:  is allergic to vicodin [hydrocodone-acetaminophen].  MEDICATIONS:  Current Outpatient Medications  Medication Sig Dispense Refill  . aspirin 81 MG tablet Take 81 mg by mouth every evening.      No current facility-administered medications for this visit.     SURGICAL HISTORY:  Past Surgical History:  Procedure Laterality Date  . APPENDECTOMY    . SHOULDER SURGERY    . VIDEO BRONCHOSCOPY WITH ENDOBRONCHIAL NAVIGATION N/A 02/07/2017   Procedure: VIDEO BRONCHOSCOPY WITH ENDOBRONCHIAL NAVIGATION;  Surgeon: [name], MD;  Location: [facility];  Service: Thoracic;  Laterality: N/A;  . VIDEO  BRONCHOSCOPY WITH ENDOBRONCHIAL ULTRASOUND N/A 02/07/2017   Procedure: VIDEO BRONCHOSCOPY WITH ENDOBRONCHIAL ULTRASOUND;  Surgeon: [name], MD;  Location: [facility];  Service: Thoracic;  Laterality: N/A;    REVIEW OF SYSTEMS:  A comprehensive review of systems was negative except for: Respiratory: positive for cough and dyspnea on exertion   PHYSICAL EXAMINATION: General appearance: alert, cooperative and no distress Head: Normocephalic, without obvious abnormality, atraumatic Neck: no adenopathy, no JVD, supple, symmetrical, trachea midline and thyroid not enlarged, symmetric, no tenderness/mass/nodules Lymph nodes: Cervical, supraclavicular, and axillary nodes normal. Resp: clear to auscultation bilaterally Back: symmetric, no curvature. ROM normal. No CVA tenderness. Cardio: regular rate and rhythm, S1, S2 normal, no murmur, click, rub or gallop GI: soft, non-tender; bowel sounds normal; no masses,  no organomegaly Extremities: extremities normal, atraumatic, no cyanosis or edema  ECOG PERFORMANCE STATUS: 1 - Symptomatic but completely ambulatory  Blood pressure 129/79, pulse 91, temperature 98.9 F (37.2 C), temperature source Oral, resp. rate (!) 24, height 5\' 10"  (1.778 m), weight 130 lb 11.2 oz (59.3 kg), SpO2 99 %.  LABORATORY DATA: Lab Results  Component Value Date   WBC 3.0 (L) 02/20/2017   HGB 15.0 02/20/2017   HCT 43.3 02/20/2017   MCV 100.0 02/20/2017   PLT 132 (L) 02/20/2017      Chemistry      Component Value Date/Time   NA 136 02/05/2017 0830   NA 137 12/10/2016 1042   K 4.4 02/05/2017 0830   K 4.4 12/10/2016 1042   CL 105 02/05/2017 0830   CO2  23 02/05/2017 0830   CO2 28 12/10/2016 1042   BUN 12 02/05/2017 0830   BUN 9.7 12/10/2016 1042   CREATININE 0.62 02/05/2017 0830   CREATININE 0.8 12/10/2016 1042      Component Value Date/Time   CALCIUM 8.8 (L) 02/05/2017 0830   CALCIUM 8.7 12/10/2016 1042   ALKPHOS 91 02/05/2017 0830   ALKPHOS 95  12/10/2016 1042   AST 34 02/05/2017 0830   AST 26 12/10/2016 1042   ALT 29 02/05/2017 0830   ALT 31 12/10/2016 1042   BILITOT 0.6 02/05/2017 0830   BILITOT 0.54 12/10/2016 1042       RADIOGRAPHIC STUDIES: Dg Chest 2 View  Result Date: 02/05/2017 CLINICAL DATA:  Cavitary mass in the left upper lobe. Mediastinal adenopathy. EXAM: CHEST  2 VIEW COMPARISON:  Chest x-ray dated 11/24/2016 and PET-CT dated 01/24/2017 FINDINGS: Irregular cavitary lesion is again noted in the left upper lobe with adjacent lateral pleural thickening. Areas of adjacent ill-defined density appears slightly less prominent than on the prior chest x-ray. Emphysematous changes are noted in both upper lobes, right more than left. Heart size and vascularity are normal. No effusions. No bone abnormality. IMPRESSION: 1. Persistent cavitary lesion in the left upper lobe with slightly decreased adjacent irregular soft tissue density. This probably represents reduced inflammation. 2. Emphysema. Electronically Signed   By: Lorriane Shire M.D.   On: 02/05/2017 13:52   [place] Core Biopsy (lymph Nodes)  Result Date: 02/20/2017 INDICATION: 57 year old male with a history of possible lymphoma. FDG avid lymph nodes. FDG avid chest mass EXAM: IMAGE GUIDED BIOPSY OF RIGHT AXILLARY LYMPH NODES MEDICATIONS: None. ANESTHESIA/SEDATION: None FLUOROSCOPY TIME:  None COMPLICATIONS: None PROCEDURE: Informed written consent was obtained from the patient after a thorough discussion of the procedural risks, benefits and alternatives. All questions were addressed. Maximal Sterile Barrier Technique was utilized including caps, mask, sterile gowns, sterile gloves, sterile drape, hand hygiene and skin antiseptic. A timeout was performed prior to the initiation of the procedure. Patient positioned supine position on the ultrasound table. Images of the right axillar performed with images stored and sent to PACs. The patient was prepped and draped in the usual sterile  fashion. 1% lidocaine was used for local anesthesia. Using ultrasound guidance, multiple 18 gauge core biopsy were acquired of the right axillary enlarged node. Tissue placed in the saline. Final image was stored. Patient tolerated the procedure well and remained hemodynamically stable throughout. No complications were encountered and no significant blood loss. IMPRESSION: Status post ultrasound-guided biopsy of right axillary lymph node. Signed, Dulcy Fanny. Earleen Newport, DO [facility] Electronically Signed   By: Corrie Mckusick D.O.   On: 02/20/2017 09:13   Dg C-arm Bronchoscopy  Result Date: 02/07/2017 C-ARM BRONCHOSCOPY: Fluoroscopy was utilized by the requesting physician.  No radiographic interpretation.    ASSESSMENT AND PLAN: This is a very pleasant 57 years old white male with left upper lobe cavitary lung mass in addition to mediastinal lymphadenopathy.  The patient underwent several biopsies that were nonconclusive for malignancy. This is probably chronic inflammatory process but malignancy cannot be completely excluded. I had a lengthy discussion with the patient today about his current condition and further management of his condition. I would refer the patient to pulmonary medicine for evaluation and management of the chronic inflammatory lung disease. I do not see any evidence of malignancy and I do not see a need for the patient to continue routine follow-up visit with me at this point.  I will be happy to see him in the future if he has a diagnosis of malignancy. The patient voices understanding of current disease status and treatment options and is in agreement with the current care plan.  All questions were answered. The patient knows to call the clinic with any problems, questions or concerns. We can certainly see the patient much sooner if necessary.  I spent 10 minutes counseling the patient face to face. The total time spent in the  appointment was 15 minutes.  Disclaimer: This note was dictated with voice recognition software. Similar sounding words can inadvertently be transcribed and may not be corrected upon review.

## 2017-02-27 NOTE — Patient Instructions (Signed)
Steps to Quit Smoking Smoking tobacco can be bad for your health. It can also affect almost every organ in your body. Smoking puts you and people around you at risk for many serious long-lasting (chronic) diseases. Quitting smoking is hard, but it is one of the best things that you can do for your health. It is never too late to quit. What are the benefits of quitting smoking? When you quit smoking, you lower your risk for getting serious diseases and conditions. They can include:  Lung cancer or lung disease.  Heart disease.  Stroke.  Heart attack.  Not being able to have children (infertility).  Weak bones (osteoporosis) and broken bones (fractures).  If you have coughing, wheezing, and shortness of breath, those symptoms may get better when you quit. You may also get sick less often. If you are pregnant, quitting smoking can help to lower your chances of having a baby of low birth weight. What can I do to help me quit smoking? Talk with your doctor about what can help you quit smoking. Some things you can do (strategies) include:  Quitting smoking totally, instead of slowly cutting back how much you smoke over a period of time.  Going to in-person counseling. You are more likely to quit if you go to many counseling sessions.  Using resources and support systems, such as: ? Online chats with a counselor. ? Phone quitlines. ? Printed self-help materials. ? Support groups or group counseling. ? Text messaging programs. ? Mobile phone apps or applications.  Taking medicines. Some of these medicines may have nicotine in them. If you are pregnant or breastfeeding, do not take any medicines to quit smoking unless your doctor says it is okay. Talk with your doctor about counseling or other things that can help you.  Talk with your doctor about using more than one strategy at the same time, such as taking medicines while you are also going to in-person counseling. This can help make  quitting easier. What things can I do to make it easier to quit? Quitting smoking might feel very hard at first, but there is a lot that you can do to make it easier. Take these steps:  Talk to your family and friends. Ask them to support and encourage you.  Call phone quitlines, reach out to support groups, or work with a counselor.  Ask people who smoke to not smoke around you.  Avoid places that make you want (trigger) to smoke, such as: ? Bars. ? Parties. ? Smoke-break areas at work.  Spend time with people who do not smoke.  Lower the stress in your life. Stress can make you want to smoke. Try these things to help your stress: ? Getting regular exercise. ? Deep-breathing exercises. ? Yoga. ? Meditating. ? Doing a body scan. To do this, close your eyes, focus on one area of your body at a time from head to toe, and notice which parts of your body are tense. Try to relax the muscles in those areas.  Download or buy apps on your mobile phone or tablet that can help you stick to your quit plan. There are many free apps, such as QuitGuide from the CDC (Centers for Disease Control and Prevention). You can find more support from smokefree.gov and other websites.  This information is not intended to replace advice given to you by your health care provider. Make sure you discuss any questions you have with your health care provider. Document Released: 10/20/2008 Document   Revised: 08/22/2015 Document Reviewed: 05/10/2014 Elsevier Interactive Patient Education  2018 [employer]vier Inc.  

## 2017-03-06 LAB — FUNGUS CULTURE RESULT

## 2017-03-06 LAB — FUNGUS CULTURE WITH STAIN

## 2017-03-06 LAB — FUNGAL ORGANISM REFLEX

## 2017-03-11 ENCOUNTER — Telehealth: Payer: Self-pay | Admitting: Thoracic Surgery (Cardiothoracic Vascular Surgery)

## 2017-03-11 NOTE — Telephone Encounter (Signed)
Called Mr. Kattner to inform him of positive AFB culture for MAC.  Will refer to ID  Remo Lipps C. [name], MD [facility] [phone]

## 2017-03-17 ENCOUNTER — Telehealth: Payer: Self-pay | Admitting: Pharmacist

## 2017-03-17 ENCOUNTER — Encounter: Payer: Self-pay | Admitting: Infectious Diseases

## 2017-03-17 ENCOUNTER — Other Ambulatory Visit: Payer: Self-pay | Admitting: Pharmacist

## 2017-03-17 ENCOUNTER — Ambulatory Visit (INDEPENDENT_AMBULATORY_CARE_PROVIDER_SITE_OTHER): Payer: Self-pay | Admitting: Infectious Diseases

## 2017-03-17 VITALS — BP 134/84 | HR 97 | Temp 98.0°F | Resp 22 | Ht 70.0 in | Wt 126.0 lb

## 2017-03-17 DIAGNOSIS — A310 Pulmonary mycobacterial infection: Secondary | ICD-10-CM | POA: Insufficient documentation

## 2017-03-17 DIAGNOSIS — Z113 Encounter for screening for infections with a predominantly sexual mode of transmission: Secondary | ICD-10-CM

## 2017-03-17 DIAGNOSIS — R110 Nausea: Secondary | ICD-10-CM

## 2017-03-17 DIAGNOSIS — Z720 Tobacco use: Secondary | ICD-10-CM

## 2017-03-17 MED ORDER — AZITHROMYCIN 250 MG PO TABS: 250.0000 mg | ORAL_TABLET | Freq: Every day | ORAL | 6 refills | Status: DC

## 2017-03-17 MED ORDER — ETHAMBUTOL HCL 400 MG PO TABS: 15.0000 mg/kg | ORAL_TABLET | Freq: Every day | ORAL | 3 refills | Status: DC

## 2017-03-17 MED ORDER — ETHAMBUTOL HCL 400 MG PO TABS: 15.0000 mg/kg | ORAL_TABLET | Freq: Every day | ORAL | 6 refills | Status: DC

## 2017-03-17 MED ORDER — RIFAMPIN 300 MG PO CAPS: 600.0000 mg | ORAL_CAPSULE | Freq: Every day | ORAL | 6 refills | Status: DC

## 2017-03-17 MED ORDER — RIFAMPIN 300 MG PO CAPS: 600.0000 mg | ORAL_CAPSULE | Freq: Every day | ORAL | 3 refills | Status: DC

## 2017-03-17 MED ORDER — ETHAMBUTOL HCL 400 MG PO TABS
15.0000 mg/kg | ORAL_TABLET | Freq: Every day | ORAL | 3 refills | Status: DC
Start: 2017-03-17 — End: 2017-03-17

## 2017-03-17 MED ORDER — ONDANSETRON 4 MG PO TBDP: 4.0000 mg | ORAL_TABLET | Freq: Three times a day (TID) | ORAL | 5 refills | Status: DC | PRN

## 2017-03-17 MED FILL — ETHAMBUTOL HCL 400 MG TAB: 400 | 30 days supply | Qty: 60 | Fill #0

## 2017-03-17 MED FILL — rifAMPin 300 MG CAPS: 300 | 30 days supply | Qty: 60 | Fill #0

## 2017-03-17 MED FILL — ONDANSETRON ODT 4 MG TABLET: 4 | 10 days supply | Qty: 30 | Fill #0

## 2017-03-17 MED FILL — AZITHROMYCIN 250 MG TABLET: 250 | 30 days supply | Qty: 30 | Fill #0

## 2017-03-17 NOTE — Progress Notes (Signed)
[facility] Pharmacy Visit  HPI: Calvin Schroeder is a 57 y.o. male who presents to the [facility] clinic today to initiate care with Dr. [name] for his recently diagnosed MAC pulmonary infection.  Patient Active Problem List   Diagnosis Date Noted  . MAI (mycobacterium avium-intracellulare) ([place]) 03/17/2017  . Erythrocytosis 06/10/2013  . Lung mass 06/10/2013  . Tobacco abuse 06/10/2013    Patient's Medications  New Prescriptions   AZITHROMYCIN (ZITHROMAX) 250 MG TABLET    Take 1 tablet (250 mg total) by mouth daily.   ETHAMBUTOL (MYAMBUTOL) 400 MG TABLET    Take 2 tablets (800 mg total) by mouth daily.   RIFAMPIN (RIFADIN) 300 MG CAPSULE    Take 2 capsules (600 mg total) by mouth daily.  Previous Medications   ASPIRIN 81 MG TABLET    Take 81 mg by mouth every evening.   Modified Medications   No medications on file  Discontinued Medications   No medications on file    Allergies: Allergies  Allergen Reactions  . Vicodin [Hydrocodone-Acetaminophen] Itching and Rash    Past Medical History: Past Medical History:  Diagnosis Date  . Back pain   . COPD (chronic obstructive pulmonary disease) (HCC)    emphysema   . Dyspnea    with exertion   . 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 Pneumonia    11/2016  . Tobacco abuse 06/10/2013    Social History: Social History   Socioeconomic History  . Marital status: Single    Spouse name: None  . Number of children: None  . Years of education: None  . Highest education level: None  Social Needs  . Financial resource strain: None  . Food insecurity - worry: None  . Food insecurity - inability: None  . Transportation needs - medical: None  . Transportation needs - non-medical: None  Occupational History  . None  Tobacco Use  . Smoking status: Current Every Day Smoker    Packs/day: 1.50    Years: 34.00    Pack years: 51.00  . Smokeless tobacco: Never Used  Substance and Sexual Activity  . Alcohol use: Yes     Comment: daily - 3-4 alcohol  . Drug use: Yes    Types: Marijuana    Comment: daily   . Sexual activity: None  Other Topics Concern  . None  Social History Narrative  . None    Labs: No results found for: HIV1RNAQUANT, HIV1RNAVL, CD4TABS, HEPBSAB, HEPBSAG, HCVAB  Lipids: No results found for: CHOL, TRIG, HDL, CHOLHDL, VLDL, LDLCALC  Current Regimen: None  Assessment: Calvin Schroeder is here today to see Dr. [name] for his newly diagnosed MAC pulmonary infection.  He is currently without insurance so I was asked to help him with cost of his medications.  He told me he is able to afford ~$100 per month.  Calvin Schroeder is able to help him out with 340b cost at [facility]. I spent time talking to Calvin Schroeder about the antibiotics and told him to call me with any issues.  Plan: - Azithromycin 250 mg PO daily - Ethambutol 800 mg PO daily - Rifampin 600 mg PO daily - Send to [facility] - F/u with Dr. [name] in 5-6 weeks  Cassie L. Kuppelweiser, PharmD, [place], [facility] 03/17/2017, 11:15 AM

## 2017-03-17 NOTE — Assessment & Plan Note (Signed)
encouraged him to quit smoking.

## 2017-03-17 NOTE — Telephone Encounter (Signed)
Called Calvin Schroeder to discuss his MAC medications with him. Spent time discussing how to take the azithromycin, ethambutol, and rifampin. Told him to take all three together every day.  He will pick up the three antibiotics at [facility] for $45/month.  He is very happy with the cost of the medications. He is also complaining of nausea already before even starting the medications, so I will send in some Zofran for him as well. Told him to take it 30 minutes before the antibiotics and to call me with any issues/questions.

## 2017-03-17 NOTE — Telephone Encounter (Signed)
Thanks

## 2017-03-17 NOTE — Progress Notes (Signed)
   Subjective:    Patient ID: Calvin Schroeder, male    DOB: 1960-03-01, 57 y.o.   MRN: [id number]  HPI 57 yo M who states that since Nov he has been weak, had stomach upset after going on a cruise. He was seen in ED 11-2016 with pneumonia and found to have "a mass on his lung." He had f/u CT showing 5 cm cystic lesion in L apex.  He had lung bx 02-07-17 that showed MAI by probe. Sensi are pending. He also had R axiallry LN bx on 02-20-17.  He has had DOE, fatigue. Has a cough when smoking marijuana, has no sptum prod.  38 year smoker, current.   The past medical history, family history and social history were reviewed/updated in EPIC Taking no medications.   Review of Systems  Constitutional: Negative for appetite change, chills, fever and unexpected weight change.  Respiratory: Positive for cough and shortness of breath.   Gastrointestinal: Positive for nausea. Negative for constipation and diarrhea.  Genitourinary: Negative for difficulty urinating and dysuria.  Psychiatric/Behavioral: Positive for sleep disturbance.  wakes up nauseas, keeps him up at night.  Feels like he has incomplete bladder evacuation.  Please see HPI. All other systems reviewed and negative.     Objective:   Physical Exam  Constitutional: He is oriented to person, place, and time. He appears well-developed and well-nourished.  HENT:  Mouth/Throat: No oropharyngeal exudate.  Eyes: EOM are normal. Pupils are equal, round, and reactive to light.  Neck: Neck supple.  Cardiovascular: Normal rate, regular rhythm and normal heart sounds.  Pulmonary/Chest: Effort normal. He has wheezes.  Abdominal: Soft. Bowel sounds are normal. There is no tenderness. There is no rebound.  Musculoskeletal: He exhibits edema.  Lymphadenopathy:    He has no cervical adenopathy.  Neurological: He is alert and oriented to person, place, and time.  Psychiatric: He has a normal mood and affect.      Assessment & Plan:

## 2017-03-17 NOTE — Assessment & Plan Note (Addendum)
He does not have insurance Will work with pharm to get him his meds- ethambutol, rifampin, azithro Will check his labs (hepatitis, hiv, cmp, cbc) Await his sensi testing on his MAI Will see him back in 6 weeks.  Will need to get him ophtho Explained possible side effects.

## 2017-03-18 ENCOUNTER — Encounter: Payer: Self-pay | Admitting: Infectious Diseases

## 2017-03-18 DIAGNOSIS — Z789 Other specified health status: Secondary | ICD-10-CM | POA: Insufficient documentation

## 2017-03-18 LAB — URINE CYTOLOGY ANCILLARY ONLY
Chlamydia: NEGATIVE
NEISSERIA GONORRHEA: NEGATIVE

## 2017-03-19 ENCOUNTER — Telehealth: Payer: Self-pay

## 2017-03-19 NOTE — Telephone Encounter (Signed)
Patient walked into clinic today requesting lab results.  He was informed not all labs have completed and we will call once Dr [name] has signed labs.  He is requesting a call for lab results   Laverle Patter, RN

## 2017-03-24 ENCOUNTER — Other Ambulatory Visit: Payer: Self-pay | Admitting: Infectious Diseases

## 2017-03-24 ENCOUNTER — Encounter: Payer: Self-pay | Admitting: Behavioral Health

## 2017-03-24 ENCOUNTER — Other Ambulatory Visit: Payer: Self-pay

## 2017-03-24 DIAGNOSIS — B20 Human immunodeficiency virus [HIV] disease: Secondary | ICD-10-CM

## 2017-03-24 LAB — HEPATITIS C ANTIBODY
Hepatitis C Ab: NONREACTIVE
SIGNAL TO CUT-OFF: 0.4 (ref <1.00)

## 2017-03-24 LAB — HIV-1/2 AB - DIFFERENTIATION
HIV-1 antibody: POSITIVE — AB
HIV-2 Ab: UNDETERMINED — AB

## 2017-03-24 LAB — CBC
HEMATOCRIT: 49.3 % (ref 38.5–50.0)
HEMOGLOBIN: 17.4 g/dL — AB (ref 13.2–17.1)
MCH: 34.1 pg — ABNORMAL HIGH (ref 27.0–33.0)
MCHC: 35.3 g/dL (ref 32.0–36.0)
MCV: 96.5 fL (ref 80.0–100.0)
RBC: 5.11 10*6/uL (ref 4.20–5.80)
RDW: 13 % (ref 11.0–15.0)
WBC: 2.6 10*3/uL — AB (ref 3.8–10.8)

## 2017-03-24 LAB — HIV ANTIBODY (ROUTINE TESTING W REFLEX): HIV: REACTIVE — AB

## 2017-03-24 LAB — HEPATITIS B CORE ANTIBODY, TOTAL: HEP B C TOTAL AB: NONREACTIVE

## 2017-03-24 LAB — COMPREHENSIVE METABOLIC PANEL
AG RATIO: 0.6 (calc) — AB (ref 1.0–2.5)
ALT: 29 U/L (ref 9–46)
AST: 35 U/L (ref 10–35)
Albumin: 3.5 g/dL — ABNORMAL LOW (ref 3.6–5.1)
Alkaline phosphatase (APISO): 106 U/L (ref 40–115)
BILIRUBIN TOTAL: 0.5 mg/dL (ref 0.2–1.2)
BUN: 10 mg/dL (ref 7–25)
CALCIUM: 8.8 mg/dL (ref 8.6–10.3)
CHLORIDE: 99 mmol/L (ref 98–110)
CO2: 32 mmol/L (ref 20–32)
Creat: 0.7 mg/dL (ref 0.70–1.33)
GLUCOSE: 90 mg/dL (ref 65–99)
Globulin: 5.8 g/dL (calc) — ABNORMAL HIGH (ref 1.9–3.7)
POTASSIUM: 4.4 mmol/L (ref 3.5–5.3)
SODIUM: 134 mmol/L — AB (ref 135–146)
TOTAL PROTEIN: 9.3 g/dL — AB (ref 6.1–8.1)

## 2017-03-24 LAB — RPR: RPR Ser Ql: NONREACTIVE

## 2017-03-24 LAB — HEPATITIS B SURFACE ANTIBODY,QUALITATIVE: Hep B S Ab: REACTIVE — AB

## 2017-03-24 LAB — HEPATITIS A ANTIBODY, TOTAL: HEPATITIS A AB,TOTAL: NONREACTIVE

## 2017-03-24 LAB — HEPATITIS B SURFACE ANTIGEN: Hepatitis B Surface Ag: NONREACTIVE

## 2017-03-24 NOTE — Progress Notes (Signed)
Pt here for lab f/u (walked in).  C/o nausea from MAI rx.  Explained labs to him, told him he had positive HIV test.  Will send HIV RNA and CD4.  Will see him back in 1 week.  He will disclose to wife, daughter.  Wife will come her for testing.  Advised to use condoms Offered/refused condoms.

## 2017-03-24 NOTE — Telephone Encounter (Signed)
Patient walked in for lab results again today.  He met with Dr [name]: Pt here for lab f/u (walked in).  C/o nausea from MAI rx.  Explained labs to him, told him he had positive HIV test.  Will send HIV RNA and CD4.  Will see him back in 1 week.  He will disclose to wife, daughter.  Wife will come her for testing.  Advised to use condoms Offered/refused condoms.   Landis Gandy, RN

## 2017-03-25 LAB — T-HELPER CELL (CD4) - (RCID CLINIC ONLY)
CD4 T CELL ABS: 80 /uL — AB (ref 400–2700)
CD4 T CELL HELPER: 13 % — AB (ref 33–55)

## 2017-03-27 ENCOUNTER — Other Ambulatory Visit: Payer: Self-pay | Admitting: Thoracic Surgery (Cardiothoracic Vascular Surgery)

## 2017-03-27 DIAGNOSIS — R911 Solitary pulmonary nodule: Secondary | ICD-10-CM

## 2017-03-28 LAB — HLA B*5701: HLA-B*5701 w/rflx HLA-B High: NEGATIVE

## 2017-04-01 ENCOUNTER — Emergency Department (HOSPITAL_COMMUNITY)
Admission: EM | Admit: 2017-04-01 | Discharge: 2017-04-01 | Disposition: A | Discharge status: Home / Self Care | Payer: Self-pay | Attending: Emergency Medicine | Admitting: Emergency Medicine

## 2017-04-01 ENCOUNTER — Encounter (HOSPITAL_COMMUNITY): Payer: Self-pay

## 2017-04-01 DIAGNOSIS — S161XXA Strain of muscle, fascia and tendon at neck level, initial encounter: Secondary | ICD-10-CM | POA: Insufficient documentation

## 2017-04-01 DIAGNOSIS — D696 Thrombocytopenia, unspecified: Secondary | ICD-10-CM | POA: Insufficient documentation

## 2017-04-01 DIAGNOSIS — R110 Nausea: Secondary | ICD-10-CM | POA: Insufficient documentation

## 2017-04-01 DIAGNOSIS — F17210 Nicotine dependence, cigarettes, uncomplicated: Secondary | ICD-10-CM | POA: Insufficient documentation

## 2017-04-01 DIAGNOSIS — D751 Secondary polycythemia: Secondary | ICD-10-CM | POA: Insufficient documentation

## 2017-04-01 DIAGNOSIS — Y9389 Activity, other specified: Secondary | ICD-10-CM | POA: Insufficient documentation

## 2017-04-01 DIAGNOSIS — S39012A Strain of muscle, fascia and tendon of lower back, initial encounter: Secondary | ICD-10-CM | POA: Insufficient documentation

## 2017-04-01 DIAGNOSIS — J449 Chronic obstructive pulmonary disease, unspecified: Secondary | ICD-10-CM | POA: Insufficient documentation

## 2017-04-01 DIAGNOSIS — Z79899 Other long term (current) drug therapy: Secondary | ICD-10-CM | POA: Insufficient documentation

## 2017-04-01 DIAGNOSIS — Z21 Asymptomatic human immunodeficiency virus [HIV] infection status: Secondary | ICD-10-CM | POA: Insufficient documentation

## 2017-04-01 DIAGNOSIS — Y999 Unspecified external cause status: Secondary | ICD-10-CM | POA: Insufficient documentation

## 2017-04-01 DIAGNOSIS — X500XXA Overexertion from strenuous movement or load, initial encounter: Secondary | ICD-10-CM | POA: Insufficient documentation

## 2017-04-01 DIAGNOSIS — Y929 Unspecified place or not applicable: Secondary | ICD-10-CM | POA: Insufficient documentation

## 2017-04-01 LAB — CBC
HEMATOCRIT: 50 % (ref 39.0–52.0)
HEMOGLOBIN: 18.1 g/dL — AB (ref 13.0–17.0)
MCH: 35.4 pg — AB (ref 26.0–34.0)
MCHC: 36.2 g/dL — ABNORMAL HIGH (ref 30.0–36.0)
MCV: 97.7 fL (ref 78.0–100.0)
Platelets: 79 10*3/uL — ABNORMAL LOW (ref 150–400)
RBC: 5.12 MIL/uL (ref 4.22–5.81)
RDW: 13.5 % (ref 11.5–15.5)
WBC: 4 10*3/uL (ref 4.0–10.5)

## 2017-04-01 LAB — URINALYSIS, ROUTINE W REFLEX MICROSCOPIC
BILIRUBIN URINE: NEGATIVE
GLUCOSE, UA: NEGATIVE mg/dL
KETONES UR: 20 mg/dL — AB
Leukocytes, UA: NEGATIVE
Nitrite: NEGATIVE
PROTEIN: 30 mg/dL — AB
SQUAMOUS EPITHELIAL / LPF: NONE SEEN
Specific Gravity, Urine: 1.029 (ref 1.005–1.030)
pH: 6 (ref 5.0–8.0)

## 2017-04-01 LAB — COMPREHENSIVE METABOLIC PANEL
ALBUMIN: 3.2 g/dL — AB (ref 3.5–5.0)
ALT: 25 U/L (ref 17–63)
AST: 42 U/L — AB (ref 15–41)
Alkaline Phosphatase: 104 U/L (ref 38–126)
Anion gap: 10 (ref 5–15)
BUN: 16 mg/dL (ref 6–20)
CHLORIDE: 96 mmol/L — AB (ref 101–111)
CO2: 26 mmol/L (ref 22–32)
Calcium: 8.5 mg/dL — ABNORMAL LOW (ref 8.9–10.3)
Creatinine, Ser: 0.74 mg/dL (ref 0.61–1.24)
GFR calc Af Amer: >60 mL/min (ref >60)
GFR calc non Af Amer: >60 mL/min (ref >60)
GLUCOSE: 107 mg/dL — AB (ref 65–99)
POTASSIUM: 3.7 mmol/L (ref 3.5–5.1)
SODIUM: 132 mmol/L — AB (ref 135–145)
Total Bilirubin: 0.9 mg/dL (ref 0.3–1.2)
Total Protein: 9.5 g/dL — ABNORMAL HIGH (ref 6.5–8.1)

## 2017-04-01 LAB — LIPASE, BLOOD: Lipase: 24 U/L (ref 11–51)

## 2017-04-01 MED ORDER — IBUPROFEN 600 MG PO TABS: 600.0000 mg | ORAL_TABLET | Freq: Four times a day (QID) | ORAL | 0 refills | Status: DC | PRN

## 2017-04-01 MED ORDER — PROMETHAZINE HCL 25 MG/ML IJ SOLN
25.0000 mg | Freq: Once | INTRAMUSCULAR | Status: AC
  Administered 2017-04-01: 25 mg via INTRAVENOUS
  Filled 2017-04-01: qty 1

## 2017-04-01 MED ORDER — KETOROLAC TROMETHAMINE 30 MG/ML IJ SOLN
30.0000 mg | Freq: Once | INTRAMUSCULAR | Status: AC
  Administered 2017-04-01: 30 mg via INTRAVENOUS
  Filled 2017-04-01: qty 1

## 2017-04-01 MED ORDER — CYCLOBENZAPRINE HCL 10 MG PO TABS
10.0000 mg | ORAL_TABLET | Freq: Once | ORAL | Status: DC
  Filled 2017-04-01: qty 1

## 2017-04-01 MED ORDER — PROMETHAZINE HCL 25 MG/ML IJ SOLN
25.0000 mg | Freq: Once | INTRAMUSCULAR | Status: DC | PRN
  Filled 2017-04-01: qty 1

## 2017-04-01 MED ORDER — SODIUM CHLORIDE 0.9 % IV BOLUS
1000.0000 mL | Freq: Once | INTRAVENOUS | Status: AC
  Administered 2017-04-01: 1000 mL via INTRAVENOUS

## 2017-04-01 MED ORDER — CYCLOBENZAPRINE HCL 10 MG PO TABS: 10.0000 mg | ORAL_TABLET | Freq: Two times a day (BID) | ORAL | 0 refills | Status: DC | PRN

## 2017-04-01 MED ORDER — PROMETHAZINE HCL 25 MG PO TABS: 25.0000 mg | ORAL_TABLET | Freq: Four times a day (QID) | ORAL | 0 refills | Status: DC | PRN

## 2017-04-01 NOTE — ED Notes (Addendum)
Pt states "please let me get out of here I've been here too long." Patient declined to sign. This RN went over prescriptions with patient then the patient decided to leave.

## 2017-04-01 NOTE — Discharge Instructions (Signed)
Take 1 tablet of Flexeril every 12 hours as needed for muscle pain and spasms.  You may also take 1 tablet of ibuprofen with food every 6 hours as needed for pain.  It is important to take this medication with food so that you do not develop an ulcer.  You can also apply ice or heat to any areas that are sore as frequently as needed throughout the day.  Start to stretch as your pain allows to avoid getting tight muscles.  Take 1 tablet of Phenergan every 6 hours as needed for nausea and/or vomiting.  Please keep your follow-up appointment with Dr. [name].  If you develop any new or worsening symptoms including vomiting despite taking Phenergan, stiffness in your neck to the point that you are not able to move it with a high fever and headache, or other new concerning symptoms, please return to the emergency department for reevaluation.

## 2017-04-01 NOTE — ED Notes (Signed)
Patient given water for po challenge 

## 2017-04-01 NOTE — ED Notes (Signed)
Bed: [facility]2 Expected date:  Expected time:  Means of arrival:  Comments: 

## 2017-04-01 NOTE — ED Triage Notes (Signed)
Pt complains of neck and shoulder pain for two days after lifting things Pt also complains of being nauseated from taking antibiotics for a lung problem

## 2017-04-01 NOTE — ED Provider Notes (Signed)
[facility] DEPT Provider Note   CSN: [id number] Arrival date & time: 04/01/17  0058     History   Chief Complaint Chief Complaint  Patient presents with  . Neck Pain  . Shoulder Pain    HPI Calvin Schroeder is a 57 y.o. male with a h/o of chronic back pain, HIV, MAC pulmonary infection, tobaccco abuse, and COPD who presents to the emergency department with multiple complaints.  Patient endorses >10 episodes of NBNB emesis for the last 3 days with nausea.  He is currently on rifampin, acebutolol, and azithromycin for Mycobacterium avium intracellular infection, followed by Dr. [name].  He had a positive HIV test last week.  He was compliant with his regimen for the first week, but discontinued the medications due to nausea and vomiting.  He states "I can't function like this. I can't eat. I can't sleep."  He has a follow-up appointment with Dr. [name] next week.  He is treated his symptoms with Zofran with no relief.  He also endorses neck and shoulder pain that began yesterday after lifting a cooler.  States the pain is present over the bilateral neck and radiates down the bilateral arms and over the bilateral back radiating down the bilateral legs.  No numbness or weakness.    He states "I think I need an anti-inflammatory."  No treatment for the symptoms prior to arrival.  He denies abdominal pain, rash, fever, chills, chest pain, or dyspnea.  He has been ambulatory without difficulty.  Infectious Disease: Dr. [name]  The history is provided by the patient. No language interpreter was used.  Neck Pain   Pertinent negatives include no chest pain, no numbness, no headaches and no weakness.  Shoulder Pain   Pertinent negatives include no numbness.    Past Medical History:  Diagnosis Date  . Back pain   . COPD (chronic obstructive pulmonary disease) (HCC)    emphysema   . Dyspnea    with exertion   . 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 Pneumonia      11/2016  . Tobacco abuse 06/10/2013    Patient Active Problem List   Diagnosis Date Noted  . 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 MAI (mycobacterium avium-intracellulare) ([place]) 03/17/2017  . Erythrocytosis 06/10/2013  . Lung mass 06/10/2013  . Tobacco abuse 06/10/2013    Past Surgical History:  Procedure Laterality Date  . APPENDECTOMY    . SHOULDER SURGERY    . VIDEO BRONCHOSCOPY WITH ENDOBRONCHIAL NAVIGATION N/A 02/07/2017   Procedure: VIDEO BRONCHOSCOPY WITH ENDOBRONCHIAL NAVIGATION;  Surgeon: [name], MD;  Location: [facility];  Service: Thoracic;  Laterality: N/A;  . VIDEO BRONCHOSCOPY WITH ENDOBRONCHIAL ULTRASOUND N/A 02/07/2017   Procedure: VIDEO BRONCHOSCOPY WITH ENDOBRONCHIAL ULTRASOUND;  Surgeon: [name], MD;  Location: [facility] OR;  Service: Thoracic;  Laterality: N/A;        Home Medications    Prior to Admission medications   Medication Sig Start Date End Date Taking? Authorizing Provider  azithromycin (ZITHROMAX) 250 MG tablet Take 1 tablet (250 mg total) by mouth daily. 03/17/17  Yes Campbell Riches, MD  ethambutol (MYAMBUTOL) 400 MG tablet Take 2 tablets (800 mg total) by mouth daily. 03/17/17  Yes Campbell Riches, MD  ondansetron (ZOFRAN ODT) 4 MG disintegrating tablet Take 1 tablet (4 mg total) by mouth every 8 (eight) hours as needed for nausea or vomiting. 03/17/17  Yes Kuppelweiser, Cassie L, RPH-CPP  rifampin (RIFADIN) 300 MG capsule  Take 2 capsules (600 mg total) by mouth daily. 03/17/17  Yes Campbell Riches, MD  cyclobenzaprine (FLEXERIL) 10 MG tablet Take 1 tablet (10 mg total) by mouth 2 (two) times daily as needed for muscle spasms. 04/01/17   Permelia Bamba A, PA-C  ibuprofen (ADVIL,MOTRIN) 600 MG tablet Take 1 tablet (600 mg total) by mouth every 6 (six) hours as needed. 04/01/17   Willia Lampert A, PA-C  promethazine (PHENERGAN) 25 MG tablet Take 1 tablet (25 mg total) by mouth every 6 (six) hours as needed for nausea or vomiting. 04/01/17    Dyon Rotert A, PA-C    Family History Family History  Problem Relation Age of Onset  . Cancer Mother        lung ca  . Cancer Father        skin ca  . Cancer Maternal Uncle        liver ca  . Cancer Maternal Grandmother        bladder ca    Social History Social History   Tobacco Use  . Smoking status: Current Every Day Smoker    Packs/day: 1.50    Years: 34.00    Pack years: 51.00  . Smokeless tobacco: Never Used  Substance Use Topics  . Alcohol use: Yes    Comment: daily - 3-4 alcohol  . Drug use: Yes    Types: Marijuana    Comment: daily      Allergies   Vicodin [hydrocodone-acetaminophen]   Review of Systems Review of Systems  Constitutional: Negative for appetite change, chills and fever.  Respiratory: Negative for shortness of breath.   Cardiovascular: Negative for chest pain.  Gastrointestinal: Positive for nausea and vomiting. Negative for abdominal pain, blood in stool, constipation and diarrhea.  Genitourinary: Negative for dysuria.  Musculoskeletal: Positive for back pain, myalgias, neck pain and neck stiffness.  Skin: Negative for rash.  Allergic/Immunologic: Positive for immunocompromised state.  Neurological: Negative for weakness, numbness and headaches.  Psychiatric/Behavioral: Negative for confusion.     Physical Exam Updated Vital Signs BP (!) 145/93 (BP Location: Right Arm)   Pulse 91   Temp 97.9 F (36.6 C) (Oral)   Resp 17   Ht _0  (1.778 m)   Wt 53.4 kg (117 lb 11.2 oz)   SpO2 100%   BMI 16.89 kg/m   Physical Exam  Constitutional: He appears well-developed.  HENT:  Head: Normocephalic.  Nose: Nose normal.  Tongue and mucous membranes appear dry.  Eyes: Conjunctivae are normal. No scleral icterus.  Neck: Neck supple.  No meningismus.  Active and passive range of motion of the neck with rotation, lateral flexion, flexion, and extension.  Cardiovascular: Normal rate, regular rhythm, normal heart sounds and intact  distal pulses. Exam reveals no gallop and no friction rub.  No murmur heard. Pulmonary/Chest: Breath sounds normal. No stridor. No respiratory distress. He has no wheezes.  Abdominal: Soft. Bowel sounds are normal. He exhibits no distension and no mass. There is no tenderness. There is no rebound and no guarding. No hernia.  Musculoskeletal: Normal range of motion. He exhibits tenderness. He exhibits no edema or deformity.  No tenderness to the cervical, thoracic, or lumbar spinous processes.  Tender to palpation over the musculature of the bilateral cervical and lumbar spine.  Thoracic musculature is unremarkable.  5 out of 5 strength against resistance of the bilateral upper and lower extremities.  Radial and DP pulses are 2+ and symmetric.  Sensation is intact throughout the periphery.  Neurological: He is alert.  Skin: Skin is warm and dry. Capillary refill takes 2 to 3 seconds.  Psychiatric: His behavior is normal.  Nursing note and vitals reviewed.  ED Treatments / Results  Labs (all labs ordered are listed, but only abnormal results are displayed) Labs Reviewed  CBC - Abnormal; Notable for the following components:      Result Value   Hemoglobin 18.1 (*)    MCH 35.4 (*)    MCHC 36.2 (*)    Platelets 79 (*)    All other components within normal limits  COMPREHENSIVE METABOLIC PANEL - Abnormal; Notable for the following components:   Sodium 132 (*)    Chloride 96 (*)    Glucose, Bld 107 (*)    Calcium 8.5 (*)    Total Protein 9.5 (*)    Albumin 3.2 (*)    AST 42 (*)    All other components within normal limits  URINALYSIS, ROUTINE W REFLEX MICROSCOPIC - Abnormal; Notable for the following components:   Color, Urine AMBER (*)    Hgb urine dipstick SMALL (*)    Ketones, ur 20 (*)    Protein, ur 30 (*)    Bacteria, UA RARE (*)    All other components within normal limits  LIPASE, BLOOD    EKG EKG Interpretation  Date/Time:  Tuesday April 01 2017 07:01:44 EDT Ventricular  Rate:  72 PR Interval:    QRS Duration: 101 QT Interval:  405 QTC Calculation: 444 R Axis:   79 Text Interpretation:  Sinus rhythm Biatrial enlargement Nonspecific T abnrm, anterolateral leads No acute changes Nonspecific ST and T wave abnormality Confirmed by Varney Biles ([id number]) on 04/01/2017 7:06:59 AM Also confirmed by Varney Biles [phone]), editor Hattie Perch ([id number])  on 04/01/2017 7:19:28 AM   Radiology No results found.  Procedures Procedures (including critical care time)  Medications Ordered in ED Medications  cyclobenzaprine (FLEXERIL) tablet 10 mg (10 mg Oral Not Given 04/01/17 0920)  sodium chloride 0.9 % bolus 1,000 mL (1,000 mLs Intravenous Given 04/01/17 0831)  promethazine (PHENERGAN) injection 25 mg (25 mg Intravenous Given 04/01/17 0832)  ketorolac (TORADOL) 30 MG/ML injection 30 mg (30 mg Intravenous Given 04/01/17 0921)     Initial Impression / Assessment and Plan / ED Course  I have reviewed the triage vital signs and the nursing notes.  Pertinent labs & imaging results that were available during my care of the patient were reviewed by me and considered in my medical decision making (see chart for details).     57 year old male with a h/o of chronic back pain, HIV, MAC pulmonary infection, tobaccco abuse, and COPD.  He has multiple complaints including mechanical neck and low back pain as well as nausea and vomiting.  He was compliant with his treatment for MAC pulmonary infection, but has stopped taking the antibiotics due to GI side effects.  The patient was discussed with Dr. Kathrynn Humble, attending physician.  On physical exam, he has no midline tenderness to the cervical, thoracic, or lumbar spine.  He is tender over the bilateral cervical musculature and lumbar musculature.  UA with mild ketonuria.  Hemoglobin elevated at 18.1.  Thrombocytopenia of 79. Mildly hyponatremic.  The patient was hydrated with a 1 L fluid bolus of normal saline.  Nausea  resolved with Phenergan.  Neck and back pain significantly improved with Flexeril and Toradol.  Spoke with Dr. [name] with infectious disease to discuss that the patient has been noncompliant with his home antibiotics.  He stated that the patient could follow-up with him in the office at his next appointment and stay  off antibiotics until that time.  At this time doubt meningismus or HIV related disease.  His exam seems consistent with musculoskeletal injury to the neck and low back following a mechanical injury.  Lab abnormalities are likely related to recent ethambutol, rifampin, and azithromycin use.  He has been given strict return precautions to the emergency department.  He is hemodynamically stable and in no acute distress.  Will discharge the patient home at this time.  Final Clinical Impressions(s) / ED Diagnoses   Final diagnoses:  Cervical myofascial strain, initial encounter  Acute myofascial strain of lumbar region, initial encounter  Nausea  Thrombocytopenia ([place])  Polycythemia    ED Discharge Orders        Ordered    cyclobenzaprine (FLEXERIL) 10 MG tablet  2 times daily PRN     04/01/17 1022    ibuprofen (ADVIL,MOTRIN) 600 MG tablet  Every 6 hours PRN     04/01/17 1022    promethazine (PHENERGAN) 25 MG tablet  Every 6 hours PRN     04/01/17 1022       Oluwatobiloba Martin A, PA-C 04/01/17 1034    Varney Biles, MD 04/01/17 2145

## 2017-04-03 LAB — HIV-1 INTEGRASE GENOTYPE

## 2017-04-03 LAB — RFLX HIV-1 INTEGRASE GENOTYPE: HIV-1 GENOTYPE: DETECTED — AB

## 2017-04-03 LAB — HIV-1 RNA ULTRAQUANT REFLEX TO GENTYP+
HIV 1 RNA Quant: 1020000 Copies/mL — ABNORMAL HIGH
HIV-1 RNA Quant, Log: 6.01 Log cps/mL — ABNORMAL HIGH

## 2017-04-04 LAB — MAC SUSCEPTIBILITY BROTH
Amikacin: 2
Ciprofloxacin: 16
Ethambutol: 2
Linezolid: 8
Streptomycin: 8

## 2017-04-04 LAB — ACID FAST CULTURE WITH REFLEXED SENSITIVITIES (MYCOBACTERIA): Acid Fast Culture: POSITIVE — AB

## 2017-04-04 LAB — AFB ORGANISM ID BY DNA PROBE
M avium complex: POSITIVE — AB
M tuberculosis complex: NEGATIVE

## 2017-04-08 ENCOUNTER — Encounter: Payer: Self-pay | Admitting: Pulmonary Disease

## 2017-04-08 ENCOUNTER — Ambulatory Visit (INDEPENDENT_AMBULATORY_CARE_PROVIDER_SITE_OTHER): Payer: Self-pay | Admitting: Pulmonary Disease

## 2017-04-08 VITALS — BP 118/78 | HR 112 | Ht 70.0 in | Wt 125.8 lb

## 2017-04-08 DIAGNOSIS — J438 Other emphysema: Secondary | ICD-10-CM

## 2017-04-08 DIAGNOSIS — R918 Other nonspecific abnormal finding of lung field: Secondary | ICD-10-CM

## 2017-04-08 DIAGNOSIS — Z720 Tobacco use: Secondary | ICD-10-CM

## 2017-04-08 MED ORDER — ALBUTEROL SULFATE HFA 108 (90 BASE) MCG/ACT IN AERS: 2.0000 | INHALATION_SPRAY | Freq: Four times a day (QID) | RESPIRATORY_TRACT | 5 refills | Status: DC | PRN

## 2017-04-08 MED ORDER — UMECLIDINIUM-VILANTEROL 62.5-25 MCG/INH IN AEPB: 1.0000 | INHALATION_SPRAY | Freq: Every day | RESPIRATORY_TRACT | 0 refills | Status: DC

## 2017-04-08 NOTE — Progress Notes (Signed)
Subjective:   PATIENT ID: Calvin Schroeder GENDER: male DOB: 05-07-1960, MRN: [id number]  Synopsis: Referred in March 2019 for a lung mass and emphysema.  He has a history of COPD and MAI.  His lung mass was biopsied on 02/07/2017 by Dr. 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 Diagnosed with HIV in 2019.  As of 03/2017 he was not tolerating MAI treatment due to GI side effects.  HPI  Chief Complaint  Patient presents with  . Consult    Referred by Dr. Julien Schroeder for lung mass.    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    He couldn't tolerate the MAI treatment > he says that he was vomiting profusely while on the MAI treatment  He says that he has been told he has COPD: > he questions this diagnosis  > he says that this diagnosis was made in 2011 > he denies dyspnea > he has a little scratchy throat at night > he gets a little short of breath when walking at a quick > he can carry in groceries, climb stairs but these are harded  > no chest tightness  Cough: > rare > not much mucus production  Weight has been stable.  He smoked 1.5 ppd for 40 years, has no desire to quit.    Of note, he was recently diagnosed with HIV.  Past Medical History:  Diagnosis Date  . Back pain   . COPD (chronic obstructive pulmonary disease) (HCC)    emphysema   . Dyspnea    with exertion   . 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 Pneumonia    11/2016  . Tobacco abuse 06/10/2013     Family History  Problem Relation Age of Onset  . Cancer Mother        lung ca  . Cancer Father        skin ca  . Cancer Maternal Uncle        liver ca  . Cancer Maternal Grandmother        bladder ca     Social History   Socioeconomic History  . Marital status: Single    Spouse name: Not on file  . Number of children: Not on file  . Years of education:  Not on file  . Highest education level: Not on file  Occupational History  . Not on file  Social Needs  . Financial resource strain: Not on file  . Food insecurity:    Worry: Not on file    Inability: Not on file  . Transportation needs:    Medical: Not on file    Non-medical: Not on file  Tobacco Use  . Smoking status: Current Every Day Smoker    Packs/day: 1.50    Years: 40.00    Pack years: 60.00  . Smokeless tobacco: Never Used  Substance and Sexual Activity  . Alcohol use: Yes    Comment: daily - 3-4 alcohol  . Drug use: Yes    Types: Marijuana    Comment: daily   . Sexual activity: Not on file  Lifestyle  . Physical activity:    Days per week: Not on file    Minutes per session: Not on file  . Stress: Not on file  Relationships  . Social connections:    Talks on phone: Not on  file    Gets together: Not on file    Attends religious service: Not on file    Active member of club or organization: Not on file    Attends meetings of clubs or organizations: Not on file    Relationship status: Not on file  . Intimate partner violence:    Fear of current or ex partner: Not on file    Emotionally abused: Not on file    Physically abused: Not on file    Forced sexual activity: Not on file  Other Topics Concern  . Not on file  Social History Narrative  . Not on file     Allergies  Allergen Reactions  . Vicodin [Hydrocodone-Acetaminophen] Itching and Rash     Outpatient Medications Prior to Visit  Medication Sig Dispense Refill  . azithromycin (ZITHROMAX) 250 MG tablet Take 1 tablet (250 mg total) by mouth daily. 30 each 6  . cyclobenzaprine (FLEXERIL) 10 MG tablet Take 1 tablet (10 mg total) by mouth 2 (two) times daily as needed for muscle spasms. 20 tablet 0  . ethambutol (MYAMBUTOL) 400 MG tablet Take 2 tablets (800 mg total) by mouth daily. 60 tablet 6  . ibuprofen (ADVIL,MOTRIN) 600 MG tablet Take 1 tablet (600 mg total) by mouth every 6 (six) hours as  needed. 30 tablet 0  . ondansetron (ZOFRAN ODT) 4 MG disintegrating tablet Take 1 tablet (4 mg total) by mouth every 8 (eight) hours as needed for nausea or vomiting. 30 tablet 5  . promethazine (PHENERGAN) 25 MG tablet Take 1 tablet (25 mg total) by mouth every 6 (six) hours as needed for nausea or vomiting. 30 tablet 0  . rifampin (RIFADIN) 300 MG capsule Take 2 capsules (600 mg total) by mouth daily. 60 capsule 6   No facility-administered medications prior to visit.     Review of Systems  Constitutional: Negative for fever and weight loss.  HENT: Negative for congestion, ear pain, nosebleeds and sore throat.   Eyes: Negative for redness.  Respiratory: Negative for cough, shortness of breath and wheezing.   Cardiovascular: Negative for palpitations, leg swelling and PND.  Gastrointestinal: Negative for nausea and vomiting.  Genitourinary: Negative for dysuria.  Skin: Negative for rash.  Neurological: Negative for headaches.  Endo/Heme/Allergies: Does not bruise/bleed easily.  Psychiatric/Behavioral: Negative for depression. The patient is not nervous/anxious.       Objective:  Physical Exam   Vitals:   04/08/17 0907  BP: 118/78  Pulse: (!) 112  SpO2: 97%  Weight: 125 lb 12.8 oz (57.1 kg)  Height: _0  (1.778 m)   RA  Gen: chronically ill appearing, emaciated, no acute distress HENT: NCAT, OP clear, neck supple without masses Eyes: PERRL, EOMi Lymph: no cervical lymphadenopathy PULM: CTA B CV: RRR, no mgr, no JVD GI: BS+, soft, nontender, no hsm Derm: no rash or skin breakdown MSK: normal bulk and tone Neuro: A&Ox4, CN II-XII intact, strength 5/5 in all 4 extremities Psyche: normal mood and affect    CBC    Component Value Date/Time   WBC 4.0 04/01/2017 0704   RBC 5.12 04/01/2017 0704   HGB 18.1 (H) 04/01/2017 0704   HGB 14.3 12/10/2016 1042   HCT 50.0 04/01/2017 0704   HCT 42.1 12/10/2016 1042   PLT 79 (L) 04/01/2017 0704   PLT 121 (L) 12/10/2016  1042   MCV 97.7 04/01/2017 0704   MCV 98.9 (H) 12/10/2016 1042   MCH 35.4 (H) 04/01/2017 0704   MCHC 36.2 (  H) 04/01/2017 0704   RDW 13.5 04/01/2017 0704   RDW 12.6 12/10/2016 1042   LYMPHSABS 0.7 (L) 12/10/2016 1042   MONOABS 0.4 12/10/2016 1042   EOSABS 0.1 12/10/2016 1042   BASOSABS 0.0 12/10/2016 1042     Chest imaging: 12/2016 Chest CT iamges reviewed showing a cavitary mass in the RUL, significant emphysema bilaterally  PFT:  Labs:  Path: 02/2017 LUL mass path: granulomas, special stains negative  Micro: 02/2017 LUL BAL: MAC  Echo:  Heart Catheterization:  Records from his visit with oncology, thoracic surgery, and ID medicine reviewed.  This year he was diagnosed with HIV, MAI from a right upper lobe mass.  He was treated with standard MAI treatment     Assessment & Plan:   Other emphysema ([place]) - Plan: Spirometry with Graph  Lung mass  Tobacco abuse  Discussion: Tara has centrilobular emphysema, a left upper lobe mass with MAI seen on the left upper lobe BAL and a new diagnosis of HIV.  I do not question the diagnosis of any of these and at this point I have no evidence that he has malignancy.  We would like to see the size of the mass improve as he is able to tolerate MAI treatment.  Given the fact that he is fairly emaciated I suspect he is got any treatment for his HIV soon as well.  Right now is not able to tolerate MAI treatment so hopefully the infectious disease pharmacy will be able to help with that.  He does have some mild symptoms from his centrilobular emphysema.  We will plan on doing spirometry testing to quantify the degree of airflow obstruction from his COPD and start him on a dual combination long-acting muscarinic antagonist and beta agonist  Plan: Centrilobular emphysema/COPD: Spirometry testing today Walk test today to measure oxygen level Anoro samples given, prescribe albuterol HFA inhaler as needed for shortness of breath We will  discuss immunizations next visit  Lung mass/left upper lobe mass: Due to MAI Keep follow-up with the infectious disease clinic  Cigarette smoking: Review the information sheet we gave you  We will see you back in 2 weeks with a nurse practitioner to make sure you are doing okay with the new medicine (Anoro)    Current Outpatient Medications:  .  azithromycin (ZITHROMAX) 250 MG tablet, Take 1 tablet (250 mg total) by mouth daily., Disp: 30 each, Rfl: 6 .  cyclobenzaprine (FLEXERIL) 10 MG tablet, Take 1 tablet (10 mg total) by mouth 2 (two) times daily as needed for muscle spasms., Disp: 20 tablet, Rfl: 0 .  ethambutol (MYAMBUTOL) 400 MG tablet, Take 2 tablets (800 mg total) by mouth daily., Disp: 60 tablet, Rfl: 6 .  ibuprofen (ADVIL,MOTRIN) 600 MG tablet, Take 1 tablet (600 mg total) by mouth every 6 (six) hours as needed., Disp: 30 tablet, Rfl: 0 .  ondansetron (ZOFRAN ODT) 4 MG disintegrating tablet, Take 1 tablet (4 mg total) by mouth every 8 (eight) hours as needed for nausea or vomiting., Disp: 30 tablet, Rfl: 5 .  promethazine (PHENERGAN) 25 MG tablet, Take 1 tablet (25 mg total) by mouth every 6 (six) hours as needed for nausea or vomiting., Disp: 30 tablet, Rfl: 0 .  rifampin (RIFADIN) 300 MG capsule, Take 2 capsules (600 mg total) by mouth daily., Disp: 60 capsule, Rfl: 6 .  albuterol (PROVENTIL HFA;VENTOLIN HFA) 108 (90 Base) MCG/ACT inhaler, Inhale 2 puffs into the lungs every 6 (six) hours as needed for wheezing or shortness  of breath., Disp: 1 Inhaler, Rfl: 5 .  umeclidinium-vilanterol (ANORO ELLIPTA) 62.5-25 MCG/INH AEPB, Inhale 1 puff into the lungs daily., Disp: 2 each, Rfl: 0

## 2017-04-08 NOTE — Progress Notes (Deleted)
   Subjective:    Patient ID: Calvin Schroeder, male    DOB: 09/28/1960, 57 y.o.   MRN: [id number]  HPI    Review of Systems     Objective:   Physical Exam        Assessment & Plan:

## 2017-04-08 NOTE — Patient Instructions (Addendum)
Centrilobular emphysema/COPD: Spirometry testing today Walk test today to measure oxygen level Anoro samples given, prescribe albuterol HFA inhaler as needed for shortness of breath We will discuss immunizations next visit  Lung mass/left upper lobe mass: Due to MAI Keep follow-up with the infectious disease clinic  Cigarette smoking: Review the information sheet we gave you  We will see you back in 2 weeks with a nurse practitioner to make sure you are doing okay with the new medicine (Anoro)

## 2017-04-09 ENCOUNTER — Other Ambulatory Visit: Payer: Self-pay | Admitting: Pharmacist Clinician (PhC)/ Clinical Pharmacy Specialist

## 2017-04-09 ENCOUNTER — Encounter: Payer: Self-pay | Admitting: Infectious Diseases

## 2017-04-09 ENCOUNTER — Ambulatory Visit (INDEPENDENT_AMBULATORY_CARE_PROVIDER_SITE_OTHER): Payer: Self-pay | Admitting: Infectious Diseases

## 2017-04-09 VITALS — BP 105/73 | HR 109 | Temp 98.3°F | Ht 70.0 in | Wt 124.0 lb

## 2017-04-09 DIAGNOSIS — B20 Human immunodeficiency virus [HIV] disease: Secondary | ICD-10-CM | POA: Insufficient documentation

## 2017-04-09 DIAGNOSIS — Z720 Tobacco use: Secondary | ICD-10-CM

## 2017-04-09 DIAGNOSIS — Z23 Encounter for immunization: Secondary | ICD-10-CM

## 2017-04-09 DIAGNOSIS — C469 Kaposi's sarcoma, unspecified: Secondary | ICD-10-CM | POA: Insufficient documentation

## 2017-04-09 DIAGNOSIS — A310 Pulmonary mycobacterial infection: Secondary | ICD-10-CM

## 2017-04-09 MED ORDER — ABACAVIR-DOLUTEGRAVIR-LAMIVUD 600-50-300 MG PO TABS: 1.0000 | ORAL_TABLET | Freq: Every day | ORAL | 2 refills | Status: DC

## 2017-04-09 MED ORDER — SULFAMETHOXAZOLE-TRIMETHOPRIM 400-80 MG PO TABS: 1.0000 | ORAL_TABLET | Freq: Every day | ORAL | 5 refills | Status: DC

## 2017-04-09 MED ORDER — EMTRICITABINE-TENOFOVIR DF 200-300 MG PO TABS: 1.0000 | ORAL_TABLET | Freq: Every day | ORAL | 3 refills | Status: DC

## 2017-04-09 MED ORDER — BICTEGRAVIR-EMTRICITAB-TENOFOV 50-200-25 MG PO TABS: 1.0000 | ORAL_TABLET | Freq: Every day | ORAL | 3 refills | Status: DC

## 2017-04-09 MED ORDER — DOLUTEGRAVIR SODIUM 50 MG PO TABS: 50.0000 mg | ORAL_TABLET | Freq: Every day | ORAL | 3 refills | Status: DC

## 2017-04-09 MED FILL — TRIUMEQ 600-50-300 MG TABS: 600-50-300 | 30 days supply | Qty: 30 | Fill #0

## 2017-04-09 NOTE — Progress Notes (Signed)
   Subjective:    Patient ID: Calvin Schroeder, male    DOB: 06-15-1960, 57 y.o.   MRN: [id number]  HPI 57 yo M previously found to have cystic lesion on chest CT on adm for PNA. 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 He was seen in ID and had HIV+.  He was started on E/A/R however developed n/v (that persisted despite zofran) and was seen in ED. He is unclear which of these medications made him feel so ill. "It was the worst I've ever felt in my life". He stopped the A/E/R.  He is only taking muscle relaxer.  Saw pulm yesterday and was given inh, he is not sure if he needs it. "I had a couple of breathing tests and a walking test. Everything turned out pretty good."    HIV 1 RNA Quant (Copies/mL)  Date Value  03/24/2017 1,020,000 (H)   CD4 T Cell Abs (/uL)  Date Value  03/24/2017 80 (L)    Review of Systems  Constitutional: Negative for appetite change, chills, fever and unexpected weight change.  HENT: Positive for mouth sores.   Respiratory: Negative for cough and shortness of breath.   Gastrointestinal: Negative for constipation and diarrhea.  Genitourinary: Negative for difficulty urinating.  Neurological: Negative for headaches.  Psychiatric/Behavioral: Negative for sleep disturbance.       Objective:   Physical Exam  Constitutional: He is oriented to person, place, and time. He appears well-developed and well-nourished.  HENT:  Head:    Mouth/Throat: No oropharyngeal exudate.  Eyes: Pupils are equal, round, and reactive to light. EOM are normal.  Neck: Neck supple.  Cardiovascular: Normal rate, regular rhythm and normal heart sounds.  Pulmonary/Chest: Effort normal and breath sounds normal.  Abdominal: Soft. Bowel sounds are normal. There is no tenderness. There is no rebound.  Musculoskeletal: He exhibits no edema.  Lymphadenopathy:    He has no cervical adenopathy.  Neurological: He is alert and oriented to person, place, and time.  Psychiatric: He has a normal mood and  affect.       Assessment & Plan:

## 2017-04-09 NOTE — Assessment & Plan Note (Signed)
Encouraged him to quit 

## 2017-04-09 NOTE — Assessment & Plan Note (Addendum)
Explained his dx to him (CD4 and HIV RNA) Will get him on ART (tivicay and truvada, with consideration for future MAI rx) Will have him meet with pharm His partner will get tested in clinic today Offered/refused condoms.  Hep A series, flu and pneumovax.  rtc in 1 month

## 2017-04-09 NOTE — Progress Notes (Signed)
Septra for ADAP

## 2017-04-09 NOTE — Addendum Note (Signed)
Addended by: Dinah Beers on: 04/09/2017 03:26 PM   Modules accepted: Orders

## 2017-04-09 NOTE — Assessment & Plan Note (Signed)
He has several lesions on his arm and legs.  I explained this to him and his partner.  Hopefully will improve with ART.

## 2017-04-09 NOTE — Progress Notes (Signed)
HPI: Calvin Schroeder is a 57 y.o. male who is here to see Dr. [name] for his MAC and new HIV dx.   Allergies: Allergies  Allergen Reactions  . Vicodin [Hydrocodone-Acetaminophen] Itching and Rash    Vitals: Temp: 98.3 F (36.8 C) (04/03 0846) Temp Source: Oral (04/03 0846) BP: 105/73 (04/03 0846) Pulse Rate: 109 (04/03 0846)  Past Medical History: Past Medical History:  Diagnosis Date  . Back pain   . COPD (chronic obstructive pulmonary disease) (HCC)    emphysema   . Dyspnea    with exertion   . 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 Pneumonia    11/2016  . Tobacco abuse 06/10/2013    Social History: Social History   Socioeconomic History  . Marital status: Single    Spouse name: Not on file  . Number of children: Not on file  . Years of education: Not on file  . Highest education level: Not on file  Occupational History  . Not on file  Social Needs  . Financial resource strain: Not on file  . Food insecurity:    Worry: Not on file    Inability: Not on file  . Transportation needs:    Medical: Not on file    Non-medical: Not on file  Tobacco Use  . Smoking status: Current Every Day Smoker    Packs/day: 1.50    Years: 40.00    Pack years: 60.00  . Smokeless tobacco: Never Used  Substance and Sexual Activity  . Alcohol use: Yes    Comment: daily - 3-4 alcohol  . Drug use: Yes    Types: Marijuana    Comment: daily   . Sexual activity: Not on file  Lifestyle  . Physical activity:    Days per week: Not on file    Minutes per session: Not on file  . Stress: Not on file  Relationships  . Social connections:    Talks on phone: Not on file    Gets together: Not on file    Attends religious service: Not on file    Active member of club or organization: Not on file    Attends meetings of clubs or organizations: Not on file    Relationship status: Not on file  Other Topics Concern  . Not on file  Social History Narrative  . Not on file    Previous  Regimen: None  Current Regimen: None  Labs: HIV 1 RNA Quant (Copies/mL)  Date Value  03/24/2017 1,020,000 (H)   CD4 T Cell Abs (/uL)  Date Value  03/24/2017 80 (L)   Hep B S Ab (no units)  Date Value  03/17/2017 REACTIVE (A)   Hepatitis B Surface Ag (no units)  Date Value  03/17/2017 NON-REACTIVE    CrCl: Estimated Creatinine Clearance: 83.9 mL/min (by C-G formula based on SCr of 0.74 mg/dL).  Lipids: No results found for: CHOL, TRIG, HDL, CHOLHDL, VLDL, LDLCALC  Assessment: [name] was recently dx with MAC inpt and subsequently, Dr. [name] tested him for HIV and he was positive. His VL is over a million and a CD4 of 80. He could not tolerate the MAC regimen of Rif/azith/ethambutol. They have to be stopped. Due to his current advanced AIDS, he needs to be started on antiretroviral immediately pending his ADAP. He has very little income. We will put him on Triumeq through [employer] [facility] today to get him the 3 months. We also have to select a regimen where it would  not interact with his future MAC therapy. Tivicay and Truvada would be ok with rifabutin/azith/etham but we would have to go through 2 sources to get the meds. Therefore, we are going to use Triumeq instead. He is HLA neg. This would be ok with his future rifabutin. [facility] will mail his meds out today. We will see if [facility] can also give him some supply of Bactrim for his PCP PNA px.   He will get it from [facility] when his ADAP is approved.   Recommendations:  Triumeq 1 PO qday Septra SS 1 PO qday  Onnie Boer, PharmD, BCPS, AAHIVP, CPP [facility] 04/09/2017, 11:01 AM  Triumeq Approval  ID [id number]  BIN [id number] PCN [id number] [id number]

## 2017-04-09 NOTE — Assessment & Plan Note (Signed)
Will re-address as he tolerates his ART.

## 2017-04-10 ENCOUNTER — Other Ambulatory Visit: Payer: Self-pay | Admitting: Pharmacist Clinician (PhC)/ Clinical Pharmacy Specialist

## 2017-04-10 MED ORDER — SULFAMETHOXAZOLE-TRIMETHOPRIM 400-80 MG PO TABS: 1.0000 | ORAL_TABLET | Freq: Every day | ORAL | 5 refills | Status: DC

## 2017-04-10 MED ORDER — ABACAVIR-DOLUTEGRAVIR-LAMIVUD 600-50-300 MG PO TABS: 1.0000 | ORAL_TABLET | Freq: Every day | ORAL | 2 refills | Status: DC

## 2017-04-22 ENCOUNTER — Ambulatory Visit (INDEPENDENT_AMBULATORY_CARE_PROVIDER_SITE_OTHER): Payer: Self-pay | Admitting: Thoracic Surgery (Cardiothoracic Vascular Surgery)

## 2017-04-22 ENCOUNTER — Encounter: Payer: Self-pay | Admitting: Thoracic Surgery (Cardiothoracic Vascular Surgery)

## 2017-04-22 ENCOUNTER — Ambulatory Visit (INDEPENDENT_AMBULATORY_CARE_PROVIDER_SITE_OTHER): Payer: Self-pay | Admitting: Adult Health

## 2017-04-22 ENCOUNTER — Encounter: Payer: Self-pay | Admitting: Adult Health

## 2017-04-22 ENCOUNTER — Ambulatory Visit
Admit: 2017-04-22 | Discharge: 2017-04-22 | Disposition: A | Discharge status: Home / Self Care | Payer: No Typology Code available for payment source | Attending: Thoracic Surgery (Cardiothoracic Vascular Surgery) | Admitting: Thoracic Surgery (Cardiothoracic Vascular Surgery)

## 2017-04-22 ENCOUNTER — Other Ambulatory Visit: Payer: Self-pay

## 2017-04-22 VITALS — BP 115/81 | HR 89 | Resp 18 | Ht 70.0 in | Wt 124.2 lb

## 2017-04-22 VITALS — BP 140/88 | HR 92 | Ht 70.0 in | Wt 124.2 lb

## 2017-04-22 DIAGNOSIS — Z720 Tobacco use: Secondary | ICD-10-CM

## 2017-04-22 DIAGNOSIS — J449 Chronic obstructive pulmonary disease, unspecified: Secondary | ICD-10-CM | POA: Insufficient documentation

## 2017-04-22 DIAGNOSIS — J439 Emphysema, unspecified: Secondary | ICD-10-CM

## 2017-04-22 DIAGNOSIS — J181 Lobar pneumonia, unspecified organism: Secondary | ICD-10-CM

## 2017-04-22 DIAGNOSIS — B20 Human immunodeficiency virus [HIV] disease: Secondary | ICD-10-CM

## 2017-04-22 DIAGNOSIS — A310 Pulmonary mycobacterial infection: Secondary | ICD-10-CM

## 2017-04-22 DIAGNOSIS — R918 Other nonspecific abnormal finding of lung field: Secondary | ICD-10-CM

## 2017-04-22 DIAGNOSIS — R911 Solitary pulmonary nodule: Secondary | ICD-10-CM

## 2017-04-22 MED ORDER — UMECLIDINIUM-VILANTEROL 62.5-25 MCG/INH IN AEPB: 1.0000 | INHALATION_SPRAY | Freq: Every day | RESPIRATORY_TRACT | 0 refills | Status: DC

## 2017-04-22 MED ORDER — IOPAMIDOL (ISOVUE-300) INJECTION 61%
75.0000 mL | Freq: Once | INTRAVENOUS | Status: AC | PRN
  Administered 2017-04-22: 75 mL via INTRAVENOUS

## 2017-04-22 MED ORDER — UMECLIDINIUM-VILANTEROL 62.5-25 MCG/INH IN AEPB: 1.0000 | INHALATION_SPRAY | Freq: Every day | RESPIRATORY_TRACT | 4 refills | Status: DC

## 2017-04-22 NOTE — Assessment & Plan Note (Signed)
Cont to follow with ID clinic .  Unable to tolerate therapy .

## 2017-04-22 NOTE — Assessment & Plan Note (Signed)
Cont to follow on serial CT chest  Smoking cessation .

## 2017-04-22 NOTE — Assessment & Plan Note (Signed)
Cont tx regimen with ID

## 2017-04-22 NOTE — Progress Notes (Signed)
_0  ID: Calvin Schroeder, male    DOB: [birth year], 57 y.o.   MRN: [id number]  Chief Complaint  Patient presents with  . Follow-up    COPD     Referring provider: No ref. 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  Newly diagnosed HIV in 2019  TEST  November 24, 2016 CT chest showed severe emphysema, consolidative change in the left upper lobe.  Negative for PE.  Mildly enlarged hilar and retroperitoneal lymph nodes.  December 24, 2016 CT chest no substantial change in a 5 cm cystic lesion in the left apex.  January 24, 2017 PET scan hypermetabolic cavitary left upper lobe mass with scattered upper normal to mildly enlarged mildly hypermetabolic lymph nodes in the neck chest abdomen and pelvis.  Spirometry April 08, 2017> results not available .   04/22/2017 Follow up : COPD , Lung Mass , MAI , HIV/AIDS   Patient returns for a 2-week follow-up.  Patient was seen last visit for a pulmonary consult to establish for COPD.  He was recently diagnosed with a left upper lobe lung mass.  Patient underwent a biopsy on February 07, 2017 that showed inflammatory changes with granuloma, special stains were negative.  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  Patient was also diagnosed with HIV/AIDS  in 2019.  He was started on MAI treatment but was unable to tolerate due to severe GI side effects.  Last visit he was recommend to start on ANORO .  Patient says he feels that it helped. Did not see much change but when ran out breathing was not as good. . Needs help with rx coverage as does not have any insurance. Has dry cough , no fever or mucus production .   He is following with infectious disease regarding his HIV treatment and MAI treatment. Was not abe to tolerate MAC tx.  PVX and Flu utd .   Pt does not have any insurance .   Allergies  Allergen Reactions  . Vicodin [Hydrocodone-Acetaminophen]  Itching and Rash    Immunization History  Administered Date(s) Administered  . Hepatitis A, Adult 04/09/2017  . Influenza,inj,Quad PF,6+ Mos 04/09/2017  . Pneumococcal Polysaccharide-23 04/09/2017    Past Medical History:  Diagnosis Date  . Back pain   . COPD (chronic obstructive pulmonary disease) (HCC)    emphysema   . Dyspnea    with exertion   . 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 Pneumonia    11/2016  . Tobacco abuse 06/10/2013    Tobacco History: Social History   Tobacco Use  Smoking Status Current Every Day Smoker  . Packs/day: 1.50  . Years: 40.00  . Pack years: 60.00  Smokeless Tobacco Never Used   Ready to quit: No Counseling given: Yes   Outpatient Encounter Medications as of 04/22/2017  Medication Sig  . abacavir-dolutegravir-lamiVUDine (TRIUMEQ) 600-50-300 MG tablet Take 1 tablet by mouth daily.  Marland Kitchen albuterol (PROVENTIL HFA;VENTOLIN HFA) 108 (90 Base) MCG/ACT inhaler Inhale 2 puffs into the lungs every 6 (six) hours as needed for wheezing or shortness of breath. (Patient not taking: Reported on 04/22/2017)  . sulfamethoxazole-trimethoprim (BACTRIM) 400-80 MG tablet Take 1 tablet by mouth daily.  Marland Kitchen ibuprofen (ADVIL,MOTRIN) 600 MG tablet Take 1 tablet (600 mg total) by mouth every 6 (six) hours as needed. (Patient not taking: Reported on 04/22/2017)  . ondansetron (ZOFRAN ODT) 4 MG  disintegrating tablet Take 1 tablet (4 mg total) by mouth every 8 (eight) hours as needed for nausea or vomiting. (Patient not taking: Reported on 04/09/2017)  . umeclidinium-vilanterol (ANORO ELLIPTA) 62.5-25 MCG/INH AEPB Inhale 1 puff into the lungs daily.  . [DISCONTINUED] azithromycin (ZITHROMAX) 250 MG tablet Take 1 tablet (250 mg total) by mouth daily. (Patient not taking: Reported on 04/09/2017)  . [DISCONTINUED] cyclobenzaprine (FLEXERIL) 10 MG tablet Take 1 tablet (10 mg total) by mouth 2 (two) times daily as needed for muscle spasms. (Patient not taking: Reported on 04/22/2017)  .  [DISCONTINUED] promethazine (PHENERGAN) 25 MG tablet Take 1 tablet (25 mg total) by mouth every 6 (six) hours as needed for nausea or vomiting. (Patient not taking: Reported on 04/22/2017)  . [DISCONTINUED] umeclidinium-vilanterol (ANORO ELLIPTA) 62.5-25 MCG/INH AEPB Inhale 1 puff into the lungs daily.  . [DISCONTINUED] umeclidinium-vilanterol (ANORO ELLIPTA) 62.5-25 MCG/INH AEPB Inhale 1 puff into the lungs daily.   No facility-administered encounter medications on file as of 04/22/2017.      Review of Systems  Constitutional:   No  weight loss, night sweats,  Fevers, chills, + fatigue, or  lassitude.  HEENT:   No headaches,  Difficulty swallowing,  Tooth/dental problems, or  Sore throat,                No sneezing, itching, ear ache, nasal congestion, post nasal drip,   CV:  No chest pain,  Orthopnea, PND, swelling in lower extremities, anasarca, dizziness, palpitations, syncope.   GI  No heartburn, indigestion, abdominal pain, nausea, vomiting, diarrhea, change in bowel habits, loss of appetite, bloody stools.   Resp:    No chest wall deformity  Skin: no rash or lesions.  GU: no dysuria, change in color of urine, no urgency or frequency.  No flank pain, no hematuria   MS:  No joint pain or swelling.  No decreased range of motion.  No back pain.    Physical Exam  BP 140/88 (BP Location: Left Arm, Cuff Size: Normal)   Pulse 92   Ht _0  (1.778 m)   Wt 124 lb 3.2 oz (56.3 kg)   SpO2 96%   BMI 17.82 kg/m   GEN: A/Ox3; pleasant , NAD, thin and cachexic    HEENT:  [place]/AT,  EACs-clear, TMs-wnl, NOSE-clear, THROAT-clear, no lesions, no postnasal drip or exudate noted. Poor dentition   NECK:  Supple w/ fair ROM; no JVD; normal carotid impulses w/o bruits; no thyromegaly or nodules palpated; no lymphadenopathy.    RESP  Clear  P & A; w/o, wheezes/ rales/ or rhonchi. no accessory muscle use, no dullness to percussion  CARD:  RRR, no m/r/g, no peripheral edema, pulses intact, no  cyanosis or clubbing.  GI:   Soft & nt; nml bowel sounds; no organomegaly or masses detected.   Musco: Warm bil, no deformities or joint swelling noted.   Neuro: alert, no focal deficits noted.    Skin: Warm, no lesions or rashes    Lab Results:  CBC    Component Value Date/Time   WBC 4.0 04/01/2017 0704   RBC 5.12 04/01/2017 0704   HGB 18.1 (H) 04/01/2017 0704   HGB 14.3 12/10/2016 1042   HCT 50.0 04/01/2017 0704   HCT 42.1 12/10/2016 1042   PLT 79 (L) 04/01/2017 0704   PLT 121 (L) 12/10/2016 1042   MCV 97.7 04/01/2017 0704   MCV 98.9 (H) 12/10/2016 1042   MCH 35.4 (H) 04/01/2017 0704   MCHC 36.2 (H) 04/01/2017  0704   RDW 13.5 04/01/2017 0704   RDW 12.6 12/10/2016 1042   LYMPHSABS 0.7 (L) 12/10/2016 1042   MONOABS 0.4 12/10/2016 1042   EOSABS 0.1 12/10/2016 1042   BASOSABS 0.0 12/10/2016 1042    BMET    Component Value Date/Time   NA 132 (L) 04/01/2017 0704   NA 137 12/10/2016 1042   K 3.7 04/01/2017 0704   K 4.4 12/10/2016 1042   CL 96 (L) 04/01/2017 0704   CO2 26 04/01/2017 0704   CO2 28 12/10/2016 1042   GLUCOSE 107 (H) 04/01/2017 0704   GLUCOSE 96 12/10/2016 1042   BUN 16 04/01/2017 0704   BUN 9.7 12/10/2016 1042   CREATININE 0.74 04/01/2017 0704   CREATININE 0.70 03/17/2017 1032   CREATININE 0.8 12/10/2016 1042   CALCIUM 8.5 (L) 04/01/2017 0704   CALCIUM 8.7 12/10/2016 1042   GFRNONAA >60 04/01/2017 0704   GFRAA >60 04/01/2017 0704    BNP No results found for: BNP  ProBNP No results found for: PROBNP  Imaging: Ct Chest W Contrast  Result Date: 04/22/2017 CLINICAL DATA:  Followup of pulmonary mass. Biopsy in January consistent with mycobacterial infection. History of HIV. EXAM: CT CHEST WITH CONTRAST TECHNIQUE: Multidetector CT imaging of the chest was performed during intravenous contrast administration. CONTRAST:  69m ISOVUE-300 IOPAMIDOL (ISOVUE-300) INJECTION 61% COMPARISON:  02/05/2017 plain film. 01/24/2017 PET. 12/24/2016 chest CT.  FINDINGS: Cardiovascular: Normal heart size. Minimal pericardial fluid is likely physiologic. Lad coronary artery atherosclerosis. No central pulmonary embolism, on this non-dedicated study. Mediastinum/Nodes: Prominent but not pathologically enlarged bilateral axillary nodes are abnormally increased in number, similar. AP window node is similar at 10 mm on image 54/2. A right hilar node measures 1.3 cm today versus 1.1 cm on the prior CT. Lungs/Pleura: No pleural fluid.  Advanced bullous type emphysema. 4 mm right upper lobe pulmonary nodule on image 60/8, new. Mild nodularity along the right minor fissure including on image 91/8, felt to be similar. Right middle lobe ill-defined peribronchovascular nodularity, including on image 117/8, new. More subtle findings in the superior segment of both lower lobes are similar to slightly increased. Left upper lobe cavitary lung mass. Example at 4.6 x 3.6 cm on image 32/8. Compare 4.5 x 3.5 cm at the same level on the prior. Felt to be similar. Example area of posterior soft tissue thickening at 9 mm on image 30/8 today versus 10 mm on 12/24/2016. Surrounding ground-glass and airspace disease with posterior left upper lobe clustered nodularity. This nodularity is increased, including on image 46/8 Upper Abdomen: Normal imaged portions of the liver, spleen, stomach, pancreas, gallbladder, adrenal glands, kidneys. Multiple small upper abdominal nodes. Musculoskeletal: No acute osseous abnormality. IMPRESSION: 1. Similar appearance of left upper lobe cavitary lung mass. Slight increase in areas of clustered nodularity within the adjacent left upper lobe. Findings are favored to be related to an ongoing atypical mycobacterial infectious process. 2. New right middle lobe infectious nodularity. Similar to slight increase in bilateral lower lobe nodularity, likely related to infectious bronchiolitis. 3. Mild right hilar adenopathy is favored to be reactive. increased number of  nodes throughout the chest and upper abdomen may relate to HIV. 4. Age advanced coronary artery atherosclerosis. Recommend assessment of coronary risk factors and consideration of medical therapy. 5. 4 mm right upper lobe pulmonary nodule is new. Non-contrast chest CT can be considered in 12 months, given risk factors. This recommendation follows the consensus statement: Guidelines for Management of Incidental Pulmonary Nodules Detected on CT Images:  From the Fleischner Society 2017; Radiology 2017; [id number]. Electronically Signed   By: Abigail Miyamoto M.D.   On: 04/22/2017 11:34     Assessment & Plan:   No problem-specific Assessment & Plan notes found for this encounter.     Rexene Edison, NP 04/22/2017

## 2017-04-22 NOTE — Progress Notes (Signed)
Calvin Schroeder [address]       [place],Calvin Schroeder [id number]             [phone]     HPI: Calvin Schroeder returns today for a follow-up visit  Calvin Schroeder is a 57 year old man with a history of tobacco abuse who became ill last fall after a cruise in the [place].  He had a CT of the chest to rule out a pulmonary embolus.  It showed a complex consolidative process in the left upper lobe.  Repeat CT in December showed persistence of the left upper lobe opacity.  It was hypermetabolic on PET/CT.  I did navigational bronchoscopy on 02/07/2017.  There was some granuloma formation with giant cells.  Initial stains were negative but cultures grew out MAI.  He saw Dr. Bobby Rumpf.  He was diagnosed with HIV.  He was started on triple antibiotic combination for MAI but was unable to tolerate that.  He continues to have a cough.  He feels like his throat is dry.  He has not had any sputum production or hemoptysis.  Past Medical History:  Diagnosis Date  . Back pain   . COPD (chronic obstructive pulmonary disease) (HCC)    emphysema   . Dyspnea    with exertion   . 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 Pneumonia    11/2016  . Tobacco abuse 06/10/2013    Current Outpatient Medications  Medication Sig Dispense Refill  . abacavir-dolutegravir-lamiVUDine (TRIUMEQ) 600-50-300 MG tablet Take 1 tablet by mouth daily. 30 tablet 2  . sulfamethoxazole-trimethoprim (BACTRIM) 400-80 MG tablet Take 1 tablet by mouth daily. 30 tablet 5  . umeclidinium-vilanterol (ANORO ELLIPTA) 62.5-25 MCG/INH AEPB Inhale 1 puff into the lungs daily. 3 each 4  . albuterol (PROVENTIL HFA;VENTOLIN HFA) 108 (90 Base) MCG/ACT inhaler Inhale 2 puffs into the lungs every 6 (six) hours as needed for wheezing or shortness of breath. (Patient not taking: Reported on 04/22/2017) 1 Inhaler 5  . ibuprofen (ADVIL,MOTRIN) 600 MG tablet Take 1 tablet (600 mg total) by mouth every 6 (six) hours as needed. (Patient not taking: Reported on  04/22/2017) 30 tablet 0  . ondansetron (ZOFRAN ODT) 4 MG disintegrating tablet Take 1 tablet (4 mg total) by mouth every 8 (eight) hours as needed for nausea or vomiting. (Patient not taking: Reported on 04/09/2017) 30 tablet 5   No current facility-administered medications for this visit.     Physical Exam BP 115/81 (BP Location: Left Arm, Patient Position: Sitting, Cuff Size: Normal)   Pulse 89   Resp 18   Ht 5\' 10"  (1.778 m)   Wt 124 lb 3.2 oz (56.3 kg)   SpO2 97% Comment: ON RA  BMI 17.56 kg/m  57 year old man in no acute distress Alert and oriented x3 with no focal deficits Lungs with faint wheezing bilaterally  Diagnostic Tests: CT CHEST WITH CONTRAST  TECHNIQUE: Multidetector CT imaging of the chest was performed during intravenous contrast administration.  CONTRAST:  46mL ISOVUE-300 IOPAMIDOL (ISOVUE-300) INJECTION 61%  COMPARISON:  02/05/2017 plain film. 01/24/2017 PET. 12/24/2016 chest CT.  FINDINGS: Cardiovascular: Normal heart size. Minimal pericardial fluid is likely physiologic. Lad coronary artery atherosclerosis. No central pulmonary embolism, on this non-dedicated study.  Mediastinum/Nodes: Prominent but not pathologically enlarged bilateral axillary nodes are abnormally increased in number, similar. AP window node is similar at 10 mm on image 54/2. A right hilar node measures 1.3 cm today versus 1.1 cm on the prior CT.  Lungs/Pleura: No pleural fluid.  Advanced bullous type emphysema.  4 mm right upper lobe pulmonary nodule on image 60/8, new.  Mild nodularity along the right minor fissure including on image 91/8, felt to be similar.  Right middle lobe ill-defined peribronchovascular nodularity, including on image 117/8, new.  More subtle findings in the superior segment of both lower lobes are similar to slightly increased.  Left upper lobe cavitary lung mass. Example at 4.6 x 3.6 cm on image 32/8. Compare 4.5 x 3.5 cm at the same  level on the prior. Felt to be similar. Example area of posterior soft tissue thickening at 9 mm on image 30/8 today versus 10 mm on 12/24/2016.  Surrounding ground-glass and airspace disease with posterior left upper lobe clustered nodularity. This nodularity is increased, including on image 46/8  Upper Abdomen: Normal imaged portions of the liver, spleen, stomach, pancreas, gallbladder, adrenal glands, kidneys. Multiple small upper abdominal nodes.  Musculoskeletal: No acute osseous abnormality.  IMPRESSION: 1. Similar appearance of left upper lobe cavitary lung mass. Slight increase in areas of clustered nodularity within the adjacent left upper lobe. Findings are favored to be related to an ongoing atypical mycobacterial infectious process. 2. New right middle lobe infectious nodularity. Similar to slight increase in bilateral lower lobe nodularity, likely related to infectious bronchiolitis. 3. Mild right hilar adenopathy is favored to be reactive. increased number of nodes throughout the chest and upper abdomen may relate to HIV. 4. Age advanced coronary artery atherosclerosis. Recommend assessment of coronary risk factors and consideration of medical therapy. 5. 4 mm right upper lobe pulmonary nodule is new. Non-contrast chest CT can be considered in 12 months, given risk factors. This recommendation follows the consensus statement: Guidelines for Management of Incidental Pulmonary Nodules Detected on CT Images: From the Fleischner Society 2017; Radiology 2017; 284:228-243.   Electronically Signed   By: Abigail Miyamoto M.D.   On: 04/22/2017 11:34 I personally reviewed the CT images and concur with the findings noted above  Impression: Calvin Schroeder is a 57 year old man with a history of tobacco abuse who had a complex cystic consolidative process in the left upper lobe.  Biopsies were negative for cancer but did show granulomas.  He eventually grew out MAI from that.   Unfortunately he was not able to tolerate the antibiotic regimen for MAI.  He is being followed by Dr. [name] for that.  His CT today shows no significant change.  Importantly there has been no progression.  I suspect all of these changes are just due to infection, but given his history of tobacco abuse I think we need to continue to keep an eye on that and make sure it does not worsen significantly.  HIV-on therapy per Dr. [name]  Plan: Return in 6 months with CT chest  Calvin Nakayama, MD [facility] [phone]

## 2017-04-22 NOTE — Patient Instructions (Addendum)
Restart ANORO 1 puff daily , rinse after use.  Pt assistance and refer to community health and wellness .  Work on not smoking .  Follow up with ID clinic as planned .  Follow up with Dr. Lake Bells in 4-6 weeks with Spirometry and As needed

## 2017-04-22 NOTE — Assessment & Plan Note (Signed)
Emphysema /COPD , smoker  Smoking cessation  Cont on ANORO  Will need spirometry on return   Plan  Patient Instructions  Restart ANORO 1 puff daily , rinse after use.  Pt assistance and refer to community health and wellness .  Work on not smoking .  Follow up with ID clinic as planned .  Follow up with Dr. Lake Bells in 4-6 weeks with Spirometry and As needed

## 2017-04-23 NOTE — Progress Notes (Signed)
Reviewed, agree 

## 2017-04-24 ENCOUNTER — Encounter: Payer: Self-pay | Admitting: Infectious Diseases

## 2017-04-30 ENCOUNTER — Encounter: Payer: Self-pay | Admitting: Infectious Diseases

## 2017-04-30 ENCOUNTER — Ambulatory Visit (INDEPENDENT_AMBULATORY_CARE_PROVIDER_SITE_OTHER): Payer: Self-pay | Admitting: Infectious Diseases

## 2017-04-30 DIAGNOSIS — C469 Kaposi's sarcoma, unspecified: Secondary | ICD-10-CM

## 2017-04-30 DIAGNOSIS — Z720 Tobacco use: Secondary | ICD-10-CM

## 2017-04-30 DIAGNOSIS — A310 Pulmonary mycobacterial infection: Secondary | ICD-10-CM

## 2017-04-30 DIAGNOSIS — B20 Human immunodeficiency virus [HIV] disease: Secondary | ICD-10-CM

## 2017-04-30 MED ORDER — EMTRICITABINE-TENOFOVIR DF 200-300 MG PO TABS: 1.0000 | ORAL_TABLET | Freq: Every day | ORAL | 5 refills | Status: DC

## 2017-04-30 MED ORDER — DOLUTEGRAVIR SODIUM 50 MG PO TABS: 50.0000 mg | ORAL_TABLET | Freq: Every day | ORAL | 5 refills | Status: DC

## 2017-04-30 NOTE — Assessment & Plan Note (Signed)
Encouraged him to cut down.

## 2017-04-30 NOTE — Assessment & Plan Note (Signed)
Will hold rx until we can get his ART stabilized.

## 2017-04-30 NOTE — Assessment & Plan Note (Signed)
Encouraged him to take his ART to improve this.  Hold on onc eval at this point til he has been on ART.  Should improve with ART.

## 2017-04-30 NOTE — Progress Notes (Signed)
HPI: Calvin Schroeder is a 57 y.o. male who is here for his HIV visit with Dr. [name].   Allergies: Allergies  Allergen Reactions  . Vicodin [Hydrocodone-Acetaminophen] Itching and Rash    Vitals: Temp: 98.4 F (36.9 C) (04/24 1007) Temp Source: Oral (04/24 1007) BP: 107/72 (04/24 1007) Pulse Rate: 102 (04/24 1007)  Past Medical History: Past Medical History:  Diagnosis Date  . Back pain   . COPD (chronic obstructive pulmonary disease) (HCC)    emphysema   . Dyspnea    with exertion   . 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 Pneumonia    11/2016  . Tobacco abuse 06/10/2013    Social History: Social History   Socioeconomic History  . Marital status: Single    Spouse name: Not on file  . Number of children: Not on file  . Years of education: Not on file  . Highest education level: Not on file  Occupational History  . Not on file  Social Needs  . Financial resource strain: Not on file  . Food insecurity:    Worry: Not on file    Inability: Not on file  . Transportation needs:    Medical: Not on file    Non-medical: Not on file  Tobacco Use  . Smoking status: Current Every Day Smoker    Packs/day: 1.50    Years: 40.00    Pack years: 60.00  . Smokeless tobacco: Never Used  Substance and Sexual Activity  . Alcohol use: Yes    Comment: daily - 3-4 alcohol  . Drug use: Yes    Types: Marijuana    Comment: daily   . Sexual activity: Not on file  Lifestyle  . Physical activity:    Days per week: Not on file    Minutes per session: Not on file  . Stress: Not on file  Relationships  . Social connections:    Talks on phone: Not on file    Gets together: Not on file    Attends religious service: Not on file    Active member of club or organization: Not on file    Attends meetings of clubs or organizations: Not on file    Relationship status: Not on file  Other Topics Concern  . Not on file  Social History Narrative  . Not on file    Previous  Regimen: None  Current Regimen: Off  Labs: HIV 1 RNA Quant (Copies/mL)  Date Value  03/24/2017 1,020,000 (H)   CD4 T Cell Abs (/uL)  Date Value  03/24/2017 80 (L)   Hep B S Ab (no units)  Date Value  03/17/2017 REACTIVE (A)   Hepatitis B Surface Ag (no units)  Date Value  03/17/2017 NON-REACTIVE    CrCl: CrCl cannot be calculated (Patient's most recent lab result is older than the maximum 21 days allowed.).  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 He couldn't tolerate the MAC therapy so we had to hold it for now until his HIV is under control. We selected Triumeq due to the potential interactions with his MAC therapy. However, he couldn't tolerate the Triumeq after about 7-10 days. He complained of "bone pain" so he stopped therapy. In the meantime, Dr. 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 Therefore, we are going to change his antiretrovirals to Tivicay and Truvada today. Once again, once his MAC therapy  is resumed, Tivicay/Truvada will not be an issue with the rifabutin. His emergency ADAP has been approved so he will pick up both meds today at [facility].   Recommendations:  Dc Triumeq Start Council daily  Onnie Boer, PharmD, BCPS, AAHIVP, CPP Clinical [facility] 04/30/2017, 11:13 AM

## 2017-04-30 NOTE — Assessment & Plan Note (Signed)
Will change his ART. tivicay-descovey.  Hold bactrim while we are determining his side effects etiology.  Offered/refused condoms.  D/i pharm rtc in 2-3 weeks.

## 2017-04-30 NOTE — Progress Notes (Signed)
   Subjective:    Patient ID: Calvin Schroeder, male    DOB: Oct 07, 1960, 57 y.o.   MRN: [id number]  HPI 57 yo M previously found to have cystic lesion on chest CT on adm for PNA. 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 He was seen in ID and had HIV+.  He was started on E/A/R however developed n/v (that persisted despite zofran) and was seen in ED. He is unclear which of these medications made him feel so ill. "It was the worst I've ever felt in my life". He stopped the A/E/R.   He was seen in ID on 4-3 and started on triumeq and bactrim. He took rx ofr 10 days then quit due to bone pain and tingling/itching. Excruciating.  He felt better 48 h later.  He is HLA (-).  He has been feeling well.  He has had more KS lesions developing.   HIV 1 RNA Quant (Copies/mL)  Date Value  03/24/2017 1,020,000 (H)   CD4 T Cell Abs (/uL)  Date Value  03/24/2017 80 (L)     Review of Systems  Constitutional: Negative for appetite change and unexpected weight change.  Respiratory: Negative for shortness of breath.   Gastrointestinal: Negative for constipation and diarrhea.  Genitourinary: Negative for difficulty urinating.  Skin: Positive for rash.  Please see HPI. All other systems reviewed and negative.     Objective:   Physical Exam  Constitutional: He appears well-developed and well-nourished.  HENT:  Mouth/Throat: No oropharyngeal exudate.  Eyes: Pupils are equal, round, and reactive to light. EOM are normal.  Neck: Neck supple.  Cardiovascular: Normal rate, regular rhythm and normal heart sounds.  Pulmonary/Chest: Effort normal and breath sounds normal.  Abdominal: Soft. Bowel sounds are normal. There is no tenderness. There is no rebound.  Musculoskeletal: He exhibits no edema.  Lymphadenopathy:    He has no cervical adenopathy.  Skin:          Assessment & Plan:

## 2017-05-16 ENCOUNTER — Encounter: Payer: Self-pay | Admitting: Behavioral Health

## 2017-06-09 ENCOUNTER — Encounter: Payer: Self-pay | Admitting: Infectious Diseases

## 2017-06-09 ENCOUNTER — Ambulatory Visit (INDEPENDENT_AMBULATORY_CARE_PROVIDER_SITE_OTHER): Payer: Self-pay | Admitting: Infectious Diseases

## 2017-06-09 VITALS — BP 121/79 | HR 82 | Temp 98.1°F | Ht 70.0 in | Wt 125.0 lb

## 2017-06-09 DIAGNOSIS — B20 Human immunodeficiency virus [HIV] disease: Secondary | ICD-10-CM

## 2017-06-09 DIAGNOSIS — Z23 Encounter for immunization: Secondary | ICD-10-CM

## 2017-06-09 DIAGNOSIS — C469 Kaposi's sarcoma, unspecified: Secondary | ICD-10-CM

## 2017-06-09 DIAGNOSIS — A310 Pulmonary mycobacterial infection: Secondary | ICD-10-CM

## 2017-06-09 NOTE — Progress Notes (Signed)
   Subjective:    Patient ID: Calvin Schroeder, male    DOB: [birth year], 57 y.o.   MRN: [id number]  HPI 57 yo M previously found to have cystic lesion on chest CT on adm for PNA. 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 He was seen in ID and had HIV+.  He was started on E/A/R however developed n/v (that persisted despite zofran) and was seen in ED. He is unclear which of these medications made him feel so ill. "It was the worst I've ever felt in my life". He stopped the A/E/R.  He was seen in f/u on 4-24 and his art was changed from triumeq to tivicay-truvada. He was noted to have worsening KS. His MAI rx was held was as well.  He was seen by pharm.  His breathing is good. Feels like his purple spots on ihs skin are getting better.  He has been taking his ART and his bactrim.    HIV 1 RNA Quant (Copies/mL)  Date Value  03/24/2017 1,020,000 (H)   CD4 T Cell Abs (/uL)  Date Value  03/24/2017 80 (L)    Review of Systems  Constitutional: Negative for appetite change and unexpected weight change.  Respiratory: Negative for cough and shortness of breath.   Gastrointestinal: Negative for constipation and diarrhea.  Genitourinary: Negative for difficulty urinating.  Skin: Positive for rash.  Please see HPI. All other systems reviewed and negative.     Objective:   Physical Exam  Constitutional: He appears well-developed and well-nourished.  HENT:  Mouth/Throat: No oropharyngeal exudate.  Eyes: Pupils are equal, round, and reactive to light. EOM are normal.  Neck: Normal range of motion. Neck supple.  Cardiovascular: Normal rate, regular rhythm and normal heart sounds.  Pulmonary/Chest: Effort normal and breath sounds normal.  Abdominal: Soft. Bowel sounds are normal. There is no guarding.  Musculoskeletal: Normal range of motion. He exhibits no edema.  Lymphadenopathy:    He has no cervical adenopathy.  Skin:             Assessment & Plan:

## 2017-06-09 NOTE — Assessment & Plan Note (Signed)
Will watch.  Appears improved, if not resolved at f/u will have him seen by onc Explained to pt.

## 2017-06-09 NOTE — Assessment & Plan Note (Signed)
Will recheck his CD4 and HIV RNA today.  Explained his CD4 and HIV RNA to him He has not started bactrim.  Will see him back in 6 weeks.

## 2017-06-09 NOTE — Addendum Note (Signed)
Addended by: Pola Corn on: 06/09/2017 10:41 AM   Modules accepted: Orders

## 2017-06-09 NOTE — Assessment & Plan Note (Signed)
Will continue to hold on rx for now Explained to him that will re-try rx at his f/u.

## 2017-06-10 ENCOUNTER — Other Ambulatory Visit: Payer: Self-pay | Admitting: Pulmonary Disease

## 2017-06-10 DIAGNOSIS — J441 Chronic obstructive pulmonary disease with (acute) exacerbation: Secondary | ICD-10-CM

## 2017-06-10 LAB — T-HELPER CELL (CD4) - (RCID CLINIC ONLY)
CD4 T CELL ABS: 260 /uL — AB (ref 400–2700)
CD4 T CELL HELPER: 16 % — AB (ref 33–55)

## 2017-06-11 ENCOUNTER — Encounter: Payer: Self-pay | Admitting: Pulmonary Disease

## 2017-06-11 ENCOUNTER — Ambulatory Visit (INDEPENDENT_AMBULATORY_CARE_PROVIDER_SITE_OTHER): Payer: No Typology Code available for payment source | Admitting: Pulmonary Disease

## 2017-06-11 ENCOUNTER — Ambulatory Visit (INDEPENDENT_AMBULATORY_CARE_PROVIDER_SITE_OTHER): Payer: Self-pay | Admitting: Pulmonary Disease

## 2017-06-11 VITALS — BP 110/78 | HR 99 | Ht 70.0 in | Wt 126.0 lb

## 2017-06-11 DIAGNOSIS — B20 Human immunodeficiency virus [HIV] disease: Secondary | ICD-10-CM

## 2017-06-11 DIAGNOSIS — J441 Chronic obstructive pulmonary disease with (acute) exacerbation: Secondary | ICD-10-CM

## 2017-06-11 DIAGNOSIS — A310 Pulmonary mycobacterial infection: Secondary | ICD-10-CM

## 2017-06-11 DIAGNOSIS — R918 Other nonspecific abnormal finding of lung field: Secondary | ICD-10-CM

## 2017-06-11 DIAGNOSIS — Z720 Tobacco use: Secondary | ICD-10-CM

## 2017-06-11 DIAGNOSIS — J439 Emphysema, unspecified: Secondary | ICD-10-CM

## 2017-06-11 LAB — PULMONARY FUNCTION TEST
FEF 25-75 PRE: 1.27 L/s
FEF2575-%Pred-Pre: 39 %
FEV1-%Pred-Pre: 61 %
FEV1-PRE: 2.34 L
FEV1FVC-%PRED-PRE: 83 %
FEV6-%PRED-PRE: 76 %
FEV6-Pre: 3.63 L
FEV6FVC-%PRED-PRE: 104 %
FVC-%Pred-Pre: 73 %
FVC-PRE: 3.63 L
PRE FEV1/FVC RATIO: 65 %
PRE FEV6/FVC RATIO: 100 %

## 2017-06-11 LAB — HIV-1 RNA QUANT-NO REFLEX-BLD
HIV 1 RNA QUANT: 109 {copies}/mL — AB
HIV-1 RNA QUANT, LOG: 2.04 {Log_copies}/mL — AB

## 2017-06-11 NOTE — Patient Instructions (Signed)
Pulmonary nodules: I agree with the plan to repeat a CT scan of your chest in April 2020, I will plan on seeing you after that  Centrilobular emphysema/COPD: You have moderate COPD.  Because you have no symptoms there is no benefit to using inhaled medicines right now.  However, its very important for you to practice good hand hygiene, keep your immunizations up-to-date (be sure to get a flu shot in the fall) and to quit smoking. If you have worsening shortness of breath please let us know  Cigarette smoker: Stop smoking right away  HIV: Continue antiretroviral therapy  MAI: I recommend that you start taking antibiotics for this again, I would talk to Dr. [name] about this on the next visit  We will see you back in April 2020

## 2017-06-11 NOTE — Addendum Note (Signed)
Addended by: Della Goo C on: 06/11/2017 12:24 PM   Modules accepted: Orders

## 2017-06-11 NOTE — Progress Notes (Signed)
PFT completed today.  

## 2017-06-11 NOTE — Progress Notes (Signed)
Subjective:   PATIENT ID: Calvin Schroeder GENDER: male DOB: 11-22-1960, MRN: [id number]  Synopsis: Referred in March 2019 for a lung mass and emphysema.  He has a history of COPD and MAI.  His lung mass was biopsied on 02/07/2017 by Dr. 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 Diagnosed with HIV in 2019.  As of 03/2017 he was not tolerating MAI treatment due to GI side effects.  HPI  Chief Complaint  Patient presents with  . Emphysema    Breathing is unchanged since last OV. Denies chest tightness, wheezing, SOB or coughing. Spirometry performed today.    He took the Anoro we gave him for 10 days and he says that he didn't breathe any differently.  He says that he has not had any trouble dyspnea or problems breathing.  He is still smoking 1.5 ppd.  He reports no cough or cold symptoms right now. He tells me that in 2010 he was told he has COPD, but he is sure that he doesn't have dyspnea.  Sylus has struggled with tolerance of of the antibiotics for MAC.  However, he says the last month is really been fantastic.  He says his energy level starting to come back.  He says that when he is on the job site he can climb up and down stairs without having any difficulty breathing but his legs get weak.  Past Medical History:  Diagnosis Date  . Back pain   . COPD (chronic obstructive pulmonary disease) (HCC)    emphysema   . Dyspnea    with exertion   . 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 Pneumonia    11/2016  . Tobacco abuse 06/10/2013     Review of Systems  Constitutional: Negative for fever and weight loss.  HENT: Negative for congestion, ear pain, nosebleeds and sore throat.   Eyes: Negative for redness.  Respiratory: Negative for cough, shortness of breath and wheezing.   Cardiovascular: Negative for palpitations, leg swelling and PND.  Gastrointestinal: Negative for nausea and vomiting.  Genitourinary: Negative  for dysuria.  Skin: Negative for rash.  Neurological: Negative for headaches.  Endo/Heme/Allergies: Does not bruise/bleed easily.  Psychiatric/Behavioral: Negative for depression. The patient is not nervous/anxious.       Objective:  Physical Exam   Vitals:   06/11/17 1108  BP: 110/78  Pulse: 99  SpO2: 99%  Weight: 126 lb (57.2 kg)  Height: _0  (1.778 m)   RA  Gen: well appearing HENT: OP clear, TM's clear, neck supple PULM: CTA B, normal percussion CV: RRR, no mgr, trace edema GI: BS+, soft, nontender Derm: no cyanosis or rash Psyche: normal mood and affect   CBC    Component Value Date/Time   WBC 4.0 04/01/2017 0704   RBC 5.12 04/01/2017 0704   HGB 18.1 (H) 04/01/2017 0704   HGB 14.3 12/10/2016 1042   HCT 50.0 04/01/2017 0704   HCT 42.1 12/10/2016 1042   PLT 79 (L) 04/01/2017 0704   PLT 121 (L) 12/10/2016 1042   MCV 97.7 04/01/2017 0704   MCV 98.9 (H) 12/10/2016 1042   MCH 35.4 (H) 04/01/2017 0704   MCHC 36.2 (H) 04/01/2017 0704   RDW 13.5 04/01/2017 0704   RDW 12.6 12/10/2016 1042   LYMPHSABS 0.7 (L) 12/10/2016 1042   MONOABS 0.4 12/10/2016 1042   EOSABS 0.1 12/10/2016 1042   BASOSABS 0.0 12/10/2016 1042     Chest imaging:  12/2016 Chest CT iamges reviewed showing a cavitary mass in the RUL, significant emphysema bilaterally April 2019 CT chest showed emphysema, stability in the right upper lobe mass, a new right middle lobe nodule noted, he was noted to have "advanced coronary calcification"  PFT: June 2019 ratio 65%, FEV1 2.34 L 61% predicted, FVC 3.63 L 73% predicted  Labs:  Path: 02/2017 LUL mass path: granulomas, special stains negative  Micro: 02/2017 LUL BAL: MAC  Echo:  Heart Catheterization:  Records from his last visit with infectious diseases reviewed where he was continued on antiretroviral therapy, they were holding on treatment for MAC.     Assessment & Plan:   Chronic obstructive pulmonary disease with acute  exacerbation (HCC)  Lung mass  Tobacco abuse  MAI (mycobacterium avium-intracellulare) ([place])  AIDS (acquired immune deficiency syndrome) ([place])  Pulmonary emphysema, unspecified emphysema type ([place])  Discussion: Einar has COPD with moderate airflow obstruction but really no symptoms.  There is really no benefit to treating him with bronchodilators as it will not slow the progression of the disease and will make any difference if he is asymptomatic.  However, he desperately needs to quit smoking as his disease will continue to progress at a rapid rate if he does not.  We discussed this at length today.  I would really favor him being treated with MAI antibiotics and he he is interested in trying this again now that he is feeling better.  Hopefully antiretroviral therapy will help boost his immune system and this can also help control the MAI but ideally I think it be best for him to be back on antibiotics.  Given his emphysema and smoking history it is a good idea for [place] to keep a close eye on these nodules.  Plan: Pulmonary nodules: I agree with the plan to repeat a CT scan of your chest in April 2020, I will plan on seeing you after that  Centrilobular emphysema/COPD: You have moderate COPD.  Because you have no symptoms there is no benefit to using inhaled medicines right now.  However, its very important for you to practice good hand hygiene, keep your immunizations up-to-date (be sure to get a flu shot in the fall) and to quit smoking. If you have worsening shortness of breath please let us know  Cigarette smoker: Stop smoking right away  HIV: Continue antiretroviral therapy  MAI: I recommend that you start taking antibiotics for this again, I would talk to Dr. [name] about this on the next visit  We will see you back in April 2020    Current Outpatient Medications:  .  dolutegravir (TIVICAY) 50 MG tablet, Take 1 tablet (50 mg total) by mouth daily., Disp: 30 tablet,  Rfl: 5 .  emtricitabine-tenofovir (TRUVADA) 200-300 MG tablet, Take 1 tablet by mouth daily., Disp: 30 tablet, Rfl: 5

## 2017-07-28 ENCOUNTER — Ambulatory Visit (INDEPENDENT_AMBULATORY_CARE_PROVIDER_SITE_OTHER): Payer: Self-pay | Admitting: Infectious Diseases

## 2017-07-28 ENCOUNTER — Encounter: Payer: Self-pay | Admitting: Infectious Diseases

## 2017-07-28 VITALS — BP 123/77 | HR 83 | Temp 98.2°F | Wt 131.0 lb

## 2017-07-28 DIAGNOSIS — B20 Human immunodeficiency virus [HIV] disease: Secondary | ICD-10-CM

## 2017-07-28 DIAGNOSIS — C469 Kaposi's sarcoma, unspecified: Secondary | ICD-10-CM

## 2017-07-28 DIAGNOSIS — Z23 Encounter for immunization: Secondary | ICD-10-CM

## 2017-07-28 DIAGNOSIS — Z720 Tobacco use: Secondary | ICD-10-CM

## 2017-07-28 DIAGNOSIS — A310 Pulmonary mycobacterial infection: Secondary | ICD-10-CM

## 2017-07-28 MED ORDER — EMTRICITABINE-TENOFOVIR AF 200-25 MG PO TABS: 1.0000 | ORAL_TABLET | Freq: Every day | ORAL | 3 refills | Status: DC

## 2017-07-28 NOTE — Assessment & Plan Note (Signed)
Reminded him he does not need to take bactrim Will continue tivicay, change to descvey.  Next Hep A today.  Offered/refused condoms.  rtc in 4 months

## 2017-07-28 NOTE — Addendum Note (Signed)
Addended by: Aundria Rud on: 07/28/2017 10:28 AM   Modules accepted: Orders

## 2017-07-28 NOTE — Progress Notes (Signed)
   Subjective:    Patient ID: Calvin Schroeder, male    DOB: September 26, 1960, 57 y.o.   MRN: [id number]  HPI 57 yo M previously found to have cystic lesion on chest CT on adm for PNA. 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 He was seen in ID was found to have AIDS.  He was started on E/A/R however developed n/v (that persisted despite zofran)  He was started on tivicay-truvada and his MAI rx has been on hold.  He has noted purplish bruise like lesions on his arms, they have not improved.  He has had f/u with pulm for COPD. He has also f/u with [facility] for a cyctic mass in LUL. His f/u CT was unchanged.   HIV 1 RNA Quant  Date Value  06/09/2017 109 copies/mL (H)  03/24/2017 1,020,000 Copies/mL (H)   CD4 T Cell Abs (/uL)  Date Value  06/09/2017 260 (L)  03/24/2017 80 (L)    Review of Systems  Constitutional: Negative for appetite change, chills, fever and unexpected weight change.  Respiratory: Negative for cough and shortness of breath.   Gastrointestinal: Positive for nausea. Negative for constipation and diarrhea.  Genitourinary: Negative for difficulty urinating.  Please see HPI. All other systems reviewed and negative.      Objective:   Physical Exam  Constitutional: He is oriented to person, place, and time. He appears well-developed and well-nourished.  HENT:  Mouth/Throat: No oropharyngeal exudate.  Eyes: Pupils are equal, round, and reactive to light. EOM are normal.  Neck: Normal range of motion. Neck supple.  Cardiovascular: Normal rate, regular rhythm and normal heart sounds.  Pulmonary/Chest: Effort normal and breath sounds normal.  Abdominal: Soft. Bowel sounds are normal. There is no tenderness. There is no guarding.  Musculoskeletal: Normal range of motion.  Lymphadenopathy:    He has no cervical adenopathy.  Neurological: He is alert and oriented to person, place, and time.  Skin:     Psychiatric: He has a normal mood and affect.          Assessment & Plan:

## 2017-07-28 NOTE — Assessment & Plan Note (Signed)
Will re-address as his CD4 improves.  Potentially restart at next visit.

## 2017-07-28 NOTE — Assessment & Plan Note (Signed)
Encouraged to quit. 

## 2017-07-28 NOTE — Assessment & Plan Note (Signed)
Will have him seen by onc

## 2017-07-30 ENCOUNTER — Telehealth: Payer: Self-pay | Admitting: Oncology

## 2017-07-30 ENCOUNTER — Encounter: Payer: Self-pay | Admitting: Oncology

## 2017-07-30 NOTE — Telephone Encounter (Signed)
New referral received from Dr. [name] for [place]. Pt has been scheduled to see Dr. Alen Blew on 7/31 at 2pm. Mr.Rotondo is a former pt of Dr. Julien Nordmann who was being seen for hematology. I received a staff msg from [place], Dr. Worthy Flank nurse who says that Dr. Julien Nordmann believes pt should be seen by Dr. Alen Blew. Msg sent to Dr. Alen Blew regarding the change. Letter mailed to the pt.

## 2017-08-04 ENCOUNTER — Encounter: Payer: Self-pay | Admitting: Infectious Diseases

## 2017-08-06 ENCOUNTER — Ambulatory Visit: Payer: Self-pay | Admitting: Oncology

## 2017-08-06 ENCOUNTER — Inpatient Hospital Stay: Payer: Self-pay | Attending: Oncology | Admitting: Oncology

## 2017-08-06 ENCOUNTER — Telehealth: Payer: Self-pay | Admitting: Oncology

## 2017-08-06 VITALS — BP 137/82 | HR 78 | Temp 99.1°F | Resp 17 | Ht 70.0 in | Wt 135.3 lb

## 2017-08-06 DIAGNOSIS — L988 Other specified disorders of the skin and subcutaneous tissue: Secondary | ICD-10-CM | POA: Insufficient documentation

## 2017-08-06 DIAGNOSIS — Z808 Family history of malignant neoplasm of other organs or systems: Secondary | ICD-10-CM | POA: Insufficient documentation

## 2017-08-06 DIAGNOSIS — F17210 Nicotine dependence, cigarettes, uncomplicated: Secondary | ICD-10-CM | POA: Insufficient documentation

## 2017-08-06 DIAGNOSIS — A310 Pulmonary mycobacterial infection: Secondary | ICD-10-CM | POA: Insufficient documentation

## 2017-08-06 DIAGNOSIS — B20 Human immunodeficiency virus [HIV] disease: Secondary | ICD-10-CM | POA: Insufficient documentation

## 2017-08-06 DIAGNOSIS — Z801 Family history of malignant neoplasm of trachea, bronchus and lung: Secondary | ICD-10-CM | POA: Insufficient documentation

## 2017-08-06 DIAGNOSIS — C469 Kaposi's sarcoma, unspecified: Secondary | ICD-10-CM

## 2017-08-06 NOTE — Telephone Encounter (Signed)
Appointments scheduled AVS/Calendar printed per 7/31 los °

## 2017-08-06 NOTE — Progress Notes (Signed)
Reason for the request: Kaposi's sarcoma  HPI: I was asked by Dr. [name]  to evaluate Mr. Calvin Schroeder for a pigmented lesion on his arm.  He is a pleasant 57 year old man who was in his usual state of health until he presented acutely in November 2019 with symptoms of shortness of breath and nausea and vomiting in November 2018.  He was on a cruise and upon his return he was seen in the emergency department and CT scan of the chest showed consolidative changes in the left upper lobe.  Tissue biopsy obtained by Dr. [name] did not show any evidence of malignancy and his culture showed MAI.  He was started on triple antibiotic therapy and at that time was diagnosed by Dr. [name] with HIV.  He did not tolerate MAI therapy and currently on antiretroviral therapy for his HIV.  CD4 count in March 2019 was 80 and was up to 260 in June 2019.  Around the same time of diagnosis of his HIV he noted pigmented lesions on his arm, chest, back and buttocks.  Since the start of antiretroviral therapy these lesions did not grow or no new lesions has been detected.  Some of the lesions have darkened and became more flat.  He denies any other respiratory symptoms at this time.  He does report periodic cough but no hemoptysis.  He denies any hematochezia or melena.  His appetite is improved and gained more weight.  He does not report any headaches, blurry vision, syncope or seizures. Does not report any fevers, chills or sweats.  Does not report any cough, wheezing or hemoptysis.  Does not report any chest pain, palpitation, orthopnea or leg edema.  Does not report any nausea, vomiting or abdominal pain.  Does not report any constipation or diarrhea.  Does not report any skeletal complaints.    Does not report frequency, urgency or hematuria.  Does not report any skin rashes or lesions. Does not report any heat or cold intolerance.  Does not report any lymphadenopathy or petechiae.  Does not report any anxiety or depression.   Remaining review of systems is negative.    Past Medical History:  Diagnosis Date  . Back pain   . COPD (chronic obstructive pulmonary disease) (HCC)    emphysema   . Dyspnea    with exertion   . 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 Pneumonia    11/2016  . Tobacco abuse 06/10/2013  :  Past Surgical History:  Procedure Laterality Date  . APPENDECTOMY    . SHOULDER SURGERY    . VIDEO BRONCHOSCOPY WITH ENDOBRONCHIAL NAVIGATION N/A 02/07/2017   Procedure: VIDEO BRONCHOSCOPY WITH ENDOBRONCHIAL NAVIGATION;  Surgeon: [name], MD;  Location: [facility];  Service: Thoracic;  Laterality: N/A;  . VIDEO BRONCHOSCOPY WITH ENDOBRONCHIAL ULTRASOUND N/A 02/07/2017   Procedure: VIDEO BRONCHOSCOPY WITH ENDOBRONCHIAL ULTRASOUND;  Surgeon: [name], MD;  Location: [facility];  Service: Thoracic;  Laterality: N/A;  :   Current Outpatient Medications:  .  dolutegravir (TIVICAY) 50 MG tablet, Take 1 tablet (50 mg total) by mouth daily., Disp: 30 tablet, Rfl: 5 .  emtricitabine-tenofovir AF (DESCOVY) 200-25 MG tablet, Take 1 tablet by mouth daily., Disp: 90 tablet, Rfl: 3:  Allergies  Allergen Reactions  . Vicodin [Hydrocodone-Acetaminophen] Itching and Rash  :  Family History  Problem Relation Age of Onset  . Cancer Mother        lung ca  . Cancer Father  skin ca  . Cancer Maternal Uncle        liver ca  . Cancer Maternal Grandmother        bladder ca  :  Social History   Socioeconomic History  . Marital status: Single    Spouse name: Not on file  . Number of children: Not on file  . Years of education: Not on file  . Highest education level: Not on file  Occupational History  . Not on file  Social Needs  . Financial resource strain: Not on file  . Food insecurity:    Worry: Not on file    Inability: Not on file  . Transportation needs:    Medical: Not on file    Non-medical: Not on file  Tobacco Use  . Smoking status: Current Every Day Smoker    Packs/day:  1.50    Years: 40.00    Pack years: 60.00  . Smokeless tobacco: Never Used  Substance and Sexual Activity  . Alcohol use: Yes    Comment: daily - 3-4 alcohol  . Drug use: Yes    Types: Marijuana    Comment: daily   . Sexual activity: Not on file  Lifestyle  . Physical activity:    Days per week: Not on file    Minutes per session: Not on file  . Stress: Not on file  Relationships  . Social connections:    Talks on phone: Not on file    Gets together: Not on file    Attends religious service: Not on file    Active member of club or organization: Not on file    Attends meetings of clubs or organizations: Not on file    Relationship status: Not on file  . Intimate partner violence:    Fear of current or ex partner: Not on file    Emotionally abused: Not on file    Physically abused: Not on file    Forced sexual activity: Not on file  Other Topics Concern  . Not on file  Social History Narrative  . Not on file  :  Pertinent items are noted in HPI.  Exam:  Blood pressure 137/82, pulse 78, temperature 99.1 F (37.3 C), temperature source Oral, resp. rate 17, height 5\' 10"  (1.778 m), weight 135 lb 4.8 oz (61.4 kg), SpO2 99 %.   ECOG 1  General appearance: alert and cooperative appeared without distress. Head: atraumatic without any abnormalities. Eyes: conjunctivae/corneas clear. PERRL.  Sclera anicteric. Throat: lips, mucosa, and tongue normal; without oral thrush or ulcers. Resp: clear to auscultation bilaterally without rhonchi, wheezes or dullness to percussion. Cardio: regular rate and rhythm, S1, S2 normal, no murmur, click, rub or gallop GI: soft, non-tender; bowel sounds normal; no masses,  no organomegaly Skin: Total of 7 pigmented lesions noted on his skin.  He has 2 small ones on his forearm that appears raised and brown in color.  He had to his anterior chest wall, 2 on his back and 2 on his buttocks. Lymph nodes: Cervical, supraclavicular, and axillary nodes  normal. Neurologic: Grossly normal without any motor, sensory or deep tendon reflexes. Musculoskeletal: No joint deformity or effusion.    Assessment and Plan:    57 year old man with the following:  1.  Pigmented lesions involving limited skin areas noted in the last 6 months started in December 2018.  This was detected in the setting of HIV diagnosis.  He has a total of 7 lesions noted that appears to  be slowly improving and no new lesions detected since he has been started on antiretroviral therapy.  The differential diagnosis was discussed today with the patient.  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  The natural course of this disease and treatment options were discussed today with the patient in detail.  He has noted these lesions prior to his treatment of antiretroviral therapy and since the start of therapy these lesions appear to have stabilized and may be slightly improved.  Treatment options were discussed today include continue observation and surveillance and monitor him closely to allow immune reconstitution and spontaneous improvement of these lesions.  I see no evidence to suggest immune reconstitution inflammatory syndrome with worsening lesions upon the start of antiretroviral therapy.  Local therapy with radiation would be less of a benefit at this time.  The role of systemic therapy with Doxil was also discussed today in detail.  Complication and logistics of Doxil administration was reviewed today.  These complications include infusion related issues, nausea, fatigue as well as cardiomyopathy were reviewed.  After discussion today we have agreed on for short period of observation with the caveat that we will start Doxil immediately if he start developing new or lesions or worsening appearance of his existing lesions.  He would like to defer systemic chemotherapy if possible and I  think it is a reasonable approach.  2.  HIV: Continues to follow with Dr. [name] regarding this issue.  3.  Left upper lobe cavitary lung mass: This has been biopsy-proven to be MAI although lung neoplasm could also be possibility given his smoking history.  Imaging studies are obtained periodically under the care of Dr. [name].  His last CT scan was unchanged in April 2019.  4.  Follow-up: We will be in 6 months but sooner if he develops any new lesions.  45  minutes was spent with the patient face-to-face today.  More than 50% of time was dedicated to discussing the natural course of this disease, treatment options and future plan of care.   Thank you for the referral.  A copy of this consult has been forwarded to the requesting physician.

## 2017-08-10 ENCOUNTER — Other Ambulatory Visit: Payer: Self-pay | Admitting: Infectious Diseases

## 2017-08-10 DIAGNOSIS — B20 Human immunodeficiency virus [HIV] disease: Secondary | ICD-10-CM

## 2017-08-11 ENCOUNTER — Other Ambulatory Visit: Payer: Self-pay | Admitting: *Deleted

## 2017-08-11 DIAGNOSIS — B20 Human immunodeficiency virus [HIV] disease: Secondary | ICD-10-CM

## 2017-08-11 MED ORDER — DOLUTEGRAVIR SODIUM 50 MG PO TABS: 50.0000 mg | ORAL_TABLET | Freq: Every day | ORAL | 5 refills | Status: DC

## 2017-08-11 MED ORDER — EMTRICITABINE-TENOFOVIR AF 200-25 MG PO TABS: 1.0000 | ORAL_TABLET | Freq: Every day | ORAL | 5 refills | Status: DC

## 2017-09-09 ENCOUNTER — Encounter (HOSPITAL_COMMUNITY): Payer: Self-pay

## 2017-09-09 ENCOUNTER — Other Ambulatory Visit: Payer: Self-pay

## 2017-09-09 ENCOUNTER — Emergency Department (HOSPITAL_COMMUNITY)
Admission: EM | Admit: 2017-09-09 | Discharge: 2017-09-09 | Disposition: A | Discharge status: Home / Self Care | Payer: Medicaid Other | Attending: Emergency Medicine | Admitting: Emergency Medicine

## 2017-09-09 ENCOUNTER — Emergency Department (HOSPITAL_COMMUNITY): Payer: Medicaid Other

## 2017-09-09 DIAGNOSIS — R072 Precordial pain: Secondary | ICD-10-CM | POA: Diagnosis not present

## 2017-09-09 DIAGNOSIS — J449 Chronic obstructive pulmonary disease, unspecified: Secondary | ICD-10-CM | POA: Insufficient documentation

## 2017-09-09 DIAGNOSIS — Z79899 Other long term (current) drug therapy: Secondary | ICD-10-CM | POA: Diagnosis not present

## 2017-09-09 DIAGNOSIS — Z21 Asymptomatic human immunodeficiency virus [HIV] infection status: Secondary | ICD-10-CM | POA: Insufficient documentation

## 2017-09-09 DIAGNOSIS — R079 Chest pain, unspecified: Secondary | ICD-10-CM | POA: Diagnosis present

## 2017-09-09 DIAGNOSIS — F17200 Nicotine dependence, unspecified, uncomplicated: Secondary | ICD-10-CM | POA: Diagnosis not present

## 2017-09-09 HISTORY — DX: Human immunodeficiency virus (HIV) disease: B20

## 2017-09-09 HISTORY — DX: Asymptomatic human immunodeficiency virus (hiv) infection status: Z21

## 2017-09-09 LAB — I-STAT TROPONIN, ED
Troponin i, poc: 0.02 ng/mL (ref 0.00–0.08)
Troponin i, poc: 0.02 ng/mL (ref 0.00–0.08)

## 2017-09-09 LAB — CBC
HEMATOCRIT: 49.2 % (ref 39.0–52.0)
Hemoglobin: 17.3 g/dL — ABNORMAL HIGH (ref 13.0–17.0)
MCH: 34.9 pg — ABNORMAL HIGH (ref 26.0–34.0)
MCHC: 35.2 g/dL (ref 30.0–36.0)
MCV: 99.4 fL (ref 78.0–100.0)
PLATELETS: 182 10*3/uL (ref 150–400)
RBC: 4.95 MIL/uL (ref 4.22–5.81)
RDW: 13.4 % (ref 11.5–15.5)
WBC: 5 10*3/uL (ref 4.0–10.5)

## 2017-09-09 LAB — BASIC METABOLIC PANEL
Anion gap: 9 (ref 5–15)
BUN: 13 mg/dL (ref 6–20)
CHLORIDE: 105 mmol/L (ref 98–111)
CO2: 27 mmol/L (ref 22–32)
CREATININE: 1.06 mg/dL (ref 0.61–1.24)
Calcium: 9.3 mg/dL (ref 8.9–10.3)
Glucose, Bld: 100 mg/dL — ABNORMAL HIGH (ref 70–99)
POTASSIUM: 3.9 mmol/L (ref 3.5–5.1)
SODIUM: 141 mmol/L (ref 135–145)

## 2017-09-09 MED ORDER — KETOROLAC TROMETHAMINE 30 MG/ML IJ SOLN
30.0000 mg | Freq: Once | INTRAMUSCULAR | Status: AC
  Administered 2017-09-09: 30 mg via INTRAVENOUS
  Filled 2017-09-09: qty 1

## 2017-09-09 NOTE — ED Triage Notes (Signed)
Pt presents to ED from home for chest pain. Pt reports that the pain woke him up around 11:30. Pt reports pain on L side of chest. Pt has shingles on L flank and ABD now, not getting treatment for it. +HIV.

## 2017-09-09 NOTE — ED Provider Notes (Signed)
[facility] DEPT Provider Note   CSN: [id number] Arrival date & time: 09/09/17  0016     History   Chief Complaint Chief Complaint  Patient presents with  . Chest Pain    HPI Calvin Schroeder is a 57 y.o. male.  HPI Patient is a 57 year old male presents the emergency department with left-sided chest pain that woke him around 11:30 PM tonight.  No pleuritic component.  Patient has active shingles in his left flank and left abdomen but reports this pain in his left anterior chest is different.  No productive cough.  No fevers or chills.  Denies nausea vomiting.  No abdominal pain.  Pain in his left chest is constant.   Past Medical History:  Diagnosis Date  . Back pain   . COPD (chronic obstructive pulmonary disease) (HCC)    emphysema   . Dyspnea    with exertion   . HIV (human immunodeficiency virus infection) ([place])   . 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 Pneumonia    11/2016  . Tobacco abuse 06/10/2013    Patient Active Problem List   Diagnosis Date Noted  . COPD (chronic obstructive pulmonary disease) ([place]) 04/22/2017  . AIDS (acquired immune deficiency syndrome) ([place]) 04/09/2017  . 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 Erythrocytosis 06/10/2013  . Lung mass 06/10/2013  . Tobacco abuse 06/10/2013    Past Surgical History:  Procedure Laterality Date  . APPENDECTOMY    . SHOULDER SURGERY    . VIDEO BRONCHOSCOPY WITH ENDOBRONCHIAL NAVIGATION N/A 02/07/2017   Procedure: VIDEO BRONCHOSCOPY WITH ENDOBRONCHIAL NAVIGATION;  Surgeon: [name], MD;  Location: [facility];  Service: Thoracic;  Laterality: N/A;  . VIDEO BRONCHOSCOPY WITH ENDOBRONCHIAL ULTRASOUND N/A 02/07/2017   Procedure: VIDEO BRONCHOSCOPY WITH ENDOBRONCHIAL ULTRASOUND;  Surgeon: [name], MD;  Location: [facility] OR;  Service: Thoracic;  Laterality: N/A;        Home  Medications    Prior to Admission medications   Medication Sig Start Date End Date Taking? Authorizing Provider  dolutegravir (TIVICAY) 50 MG tablet Take 1 tablet (50 mg total) by mouth daily. 08/11/17  Yes Campbell Riches, MD  emtricitabine-tenofovir AF (DESCOVY) 200-25 MG tablet Take 1 tablet by mouth daily. 08/11/17  Yes Campbell Riches, MD    Family History Family History  Problem Relation Age of Onset  . Cancer Mother        lung ca  . Cancer Father        skin ca  . Cancer Maternal Uncle        liver ca  . Cancer Maternal Grandmother        bladder ca    Social History Social History   Tobacco Use  . Smoking status: Current Every Day Smoker    Packs/day: 1.50    Years: 40.00    Pack years: 60.00  . Smokeless tobacco: Never Used  Substance Use Topics  . Alcohol use: Yes    Comment: daily - 3-4 alcohol  . Drug use: Yes    Types: Marijuana    Comment: daily      Allergies   Vicodin [hydrocodone-acetaminophen]   Review of Systems Review of Systems  All other systems reviewed and are negative.    Physical Exam Updated Vital Signs BP (!) 166/96   Pulse 85   Temp 97.9 F (36.6 C) (Oral)   Resp 20  Ht 5\' 10"  (1.778 m)   Wt 59 kg   SpO2 100%   BMI 18.65 kg/m   Physical Exam  Constitutional: He is oriented to person, place, and time. He appears well-developed and well-nourished.  HENT:  Head: Normocephalic and atraumatic.  Eyes: EOM are normal.  Neck: Normal range of motion.  Cardiovascular: Normal rate, regular rhythm, normal heart sounds and intact distal pulses.  Pulmonary/Chest: Effort normal and breath sounds normal. No respiratory distress.  Abdominal: Soft. He exhibits no distension. There is no tenderness.  Musculoskeletal: Normal range of motion.  Neurological: He is alert and oriented to person, place, and time.  Skin: Skin is warm and dry.  Psychiatric: He has a normal mood and affect. Judgment normal.  Nursing note and vitals  reviewed.    ED Treatments / Results  Labs (all labs ordered are listed, but only abnormal results are displayed) Labs Reviewed  BASIC METABOLIC PANEL - Abnormal; Notable for the following components:      Result Value   Glucose, Bld 100 (*)    All other components within normal limits  CBC - Abnormal; Notable for the following components:   Hemoglobin 17.3 (*)    MCH 34.9 (*)    All other components within normal limits  I-STAT TROPONIN, ED  I-STAT TROPONIN, ED    EKG EKG Interpretation  Date/Time:  Tuesday September 09 2017 00:54:07 EDT Ventricular Rate:  83 PR Interval:    QRS Duration: 101 QT Interval:  370 QTC Calculation: 435 R Axis:   78 Text Interpretation:  Sinus rhythm Biatrial enlargement Nonspecific T abnrm, anterolateral leads No significant change was found Confirmed by Jola Schmidt [phone]) on 09/09/2017 12:58:20 AM   Radiology Dg Chest 2 View  Result Date: 09/09/2017 CLINICAL DATA:  Mid chest pain this morning. Smoker. Shingles in the left anterior chest. EXAM: CHEST - 2 VIEW COMPARISON:  Chest CT 04/22/2017, CXR 02/05/2017 FINDINGS: Thick-walled cavitation in the left upper lobe with adjacent coarsened interstitial lung markings and fibrosis likely reflecting stigmata of granulomatous disease or mycobacterial infection. Emphysematous hyperinflation of the lungs with peribronchial thickening is redemonstrated. No new pulmonary consolidation, effusion mass. Heart and mediastinal contours are stable. No acute fracture or bone destruction. IMPRESSION: 1. Emphysematous hyperinflation of the lungs. 2. Redemonstration of chronic cavitation in the left upper lobe with coarsened interstitial lung markings and fibrosis favored to represent nontuberculous mycobacterial infection though difficult to entirely exclude tuberculosis. Electronically Signed   By: Ashley Royalty M.D.   On: 09/09/2017 02:15    Procedures Procedures (including critical care time)  Medications Ordered  in ED Medications  ketorolac (TORADOL) 30 MG/ML injection 30 mg (30 mg Intravenous Given 09/09/17 0129)     Initial Impression / Assessment and Plan / ED Course  I have reviewed the triage vital signs and the nursing notes.  Pertinent labs & imaging results that were available during my care of the patient were reviewed by me and considered in my medical decision making (see chart for details).     Atypical left-sided chest pain.  Doubt PE.  Doubt dissection.  EKG without ischemic changes.  Troponin negative x2.  Patient stable for discharge from the emergency department.  Primary care follow-up.  Patient understands return to the ER for new or worsening symptoms  Final Clinical Impressions(s) / ED Diagnoses   Final diagnoses:  None    ED Discharge Orders    None       Jola Schmidt, MD 09/09/17 [phone]

## 2017-09-09 NOTE — ED Notes (Signed)
ED Provider at bedside. 

## 2017-09-09 NOTE — ED Notes (Signed)
Patient transported to X-ray 

## 2017-09-09 NOTE — Discharge Instructions (Addendum)
Please call your primary care physician for follow-up  Return to the emergency department for any new or worsening symptoms

## 2017-09-17 ENCOUNTER — Other Ambulatory Visit: Payer: Self-pay | Admitting: Thoracic Surgery (Cardiothoracic Vascular Surgery)

## 2017-09-17 DIAGNOSIS — R911 Solitary pulmonary nodule: Secondary | ICD-10-CM

## 2017-09-21 NOTE — Progress Notes (Signed)
_0  ID: Calvin Schroeder, male    DOB: November 17, 1960, 57 y.o.   MRN: [id number]  Chief Complaint  Patient presents with  . Acute Visit    Cough     Referring provider: No ref. provider found  HPI:  57 year old male current smoker referred in March/2019 for lung mass and emphysema.  Lung mass was biopsied 02/07/2017 by Dr. 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  Patient was also diagnosed with HIV in 2019. 03/2017 he was not tolerating MAI treatment due to GI side effects.  Hope to restart treatment when CD4 counts are above 200.  PMH: HIV positive (followed by Dr. [name] with infectious disease) Smoker/ Smoking History: Current smoker. Maintenance: Anoro Ellipta Pt of: Dr. [place] Bells   09/22/2017  - Visit   57 year old patient seen today for progressive shortness of breath over the last 6 days.  Patient also with cough for 6 days.  Patient reports that after coughing for 5 to 10 minutes he is able to produce clear to yellow mucus.  Patient with yellow nasal drainage.  Patient reports that the cough is worse when he sleeping at night.  Patient admits that he did restart his Anoro Ellipta inhaler 3 days ago and that this is helped slightly.  Patient had previously stopped the Anoro Ellipta inhaler because he did not feel like it was helping his breathing.  Patient to follow-up with infectious disease Dr. [name] next month.  MMRC - Breathlessness Score 2 - on level ground, I walk slower than people of the same age because of breathlessness, or have to stop for breathe when walking to my own pace    Tests:  06/09/2017-CD4-260  09/09/2017-chest x-ray- eczema redemonstration of chronic cavitation in left upper lobe with coarsened interstitial markings and fibrosis favored to represent tuberculosis infection though difficult to entirely exclude tuberculosis 04/22/2017-CT chest with contrast- similar appearance of left upper lobe cavitary lung mass, findings  are favored to be related to an ongoing atypical Mycobacterium infectious process, new right middle lobe infectious nodularity mild right hilar adenopathy, 4 mm right upper lobe pulmonary nodule is new  >>>consider noncontrast CT in 12 months  06/11/2017- spirometry-FVC 3.63 (73% predicted), ratio 65, FEV1 - 61 >>>moderate airflow obstruction  Chart Review:  09/09/2017- ER visit-chest pain, patient with active shingles   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 though difficult to entirely exclude tuberculosis 04/22/2017-CT chest with contrast- similar appearance of left upper lobe cavitary lung mass, findings are favored to be related to an ongoing atypical Mycobacterium infectious process, new right middle lobe infectious nodularity mild right hilar adenopathy, 4 mm right upper lobe pulmonary nodule is new consider noncontrast CT in 12 months      GOLD COPD II B    06/11/2017- spirometry-FVC 3.63 (73% predicted), ratio 65, FEV1 - 61 >>>moderate airflow obstruction          Allergies  Allergen Reactions  . Vicodin [Hydrocodone-Acetaminophen] Itching and Rash    Immunization History  Administered Date(s) Administered  . Hepatitis A, Adult 04/09/2017, 07/28/2017  . Influenza,inj,Quad PF,6+ Mos 04/09/2017  . Pneumococcal Conjugate-13 06/09/2017  . Pneumococcal Polysaccharide-23 04/09/2017    Past Medical History:  Diagnosis Date  . Back pain   . COPD (chronic obstructive pulmonary disease) (HCC)    emphysema   .  Dyspnea    with exertion   . HIV (human immunodeficiency virus infection) ([place])   . 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 Pneumonia    11/2016  . Tobacco abuse 06/10/2013    Tobacco History: Social History   Tobacco Use  Smoking Status Current Every Day Smoker  . Packs/day:  1.50  . Years: 40.00  . Pack years: 60.00  Smokeless Tobacco Never Used  Tobacco Comment   going to 5 cigs a day down from 2 packs a day 09-22-17   Ready to quit: Yes Counseling given: Yes Comment: going to 5 cigs a day down from 2 packs a day 09-22-17  Smoking assessment and cessation counseling  Patient currently smoking: 1.5 packs per day I have advised the patient to quit/stop smoking as soon as possible due to high risk for multiple medical problems.  It will also be very difficult for [place] to manage patient's  respiratory symptoms and status if we continue to expose her lungs to a known irritant.  We do not advise e-cigarettes as a form of stopping smoking.  Patient is willing to quit smoking.  I have advised the patient that we can assist and have options of nicotine replacement therapy, provided smoking cessation education today, provided smoking cessation counseling, and provided cessation resources.  Patient agrees to try to be down to smoking 5 cigarettes a day.  Follow-up next office visit office visit for assessment of smoking cessation.  Smoking cessation counseling advised for: 5 min   Outpatient Encounter Medications as of 09/22/2017  Medication Sig  . dolutegravir (TIVICAY) 50 MG tablet Take 1 tablet (50 mg total) by mouth daily.  Marland Kitchen emtricitabine-tenofovir AF (DESCOVY) 200-25 MG tablet Take 1 tablet by mouth daily.  Marland Kitchen umeclidinium-vilanterol (ANORO ELLIPTA) 62.5-25 MCG/INH AEPB Inhale 1 puff into the lungs daily.  Marland Kitchen albuterol (PROVENTIL HFA;VENTOLIN HFA) 108 (90 Base) MCG/ACT inhaler Inhale 2 puffs into the lungs every 6 (six) hours as needed for wheezing or shortness of breath.  . doxycycline (VIBRA-TABS) 100 MG tablet Take 1 tablet (100 mg total) by mouth 2 (two) times daily.  . predniSONE (DELTASONE) 10 MG tablet 4 tabs for 2 days, then 3 tabs for 2 days, 2 tabs for 2 days, then 1 tab for 2 days, then stop  . umeclidinium-vilanterol (ANORO ELLIPTA) 62.5-25 MCG/INH AEPB  Inhale 1 puff into the lungs daily.   No facility-administered encounter medications on file as of 09/22/2017.      Review of Systems  Review of Systems  Constitutional: Positive for fatigue. Negative for activity change, chills, fever and unexpected weight change.  HENT: Positive for congestion (yellow thick drainage ), sinus pressure and sinus pain (Left sinus pain ). Negative for postnasal drip, rhinorrhea, sneezing and sore throat.   Eyes: Negative.   Respiratory: Positive for cough (clear thick mucous), shortness of breath and wheezing.   Cardiovascular: Negative for chest pain and palpitations.  Gastrointestinal: Negative for constipation, diarrhea, nausea and vomiting.  Endocrine: Negative.   Musculoskeletal: Negative.   Skin: Negative.   Neurological: Negative for dizziness and headaches.  Psychiatric/Behavioral: Negative.  Negative for dysphoric mood. The patient is not nervous/anxious.   All other systems reviewed and are negative.    Physical Exam  BP 138/82 (BP Location: Left Arm, Cuff Size: Normal)   Pulse 90   Temp 98.3 F (36.8 C) (Oral)   Ht 5' 8.5" (1.74 m)   Wt 132 lb 3.2 oz (60 kg)   SpO2 96%  BMI 19.81 kg/m   Wt Readings from Last 5 Encounters:  09/22/17 132 lb 3.2 oz (60 kg)  09/09/17 130 lb (59 kg)  08/06/17 135 lb 4.8 oz (61.4 kg)  07/28/17 131 lb (59.4 kg)  06/11/17 126 lb (57.2 kg)     Physical Exam  Constitutional: He is oriented to person, place, and time and well-developed, well-nourished, and in no distress. He appears cachectic. No distress.  HENT:  Head: Normocephalic and atraumatic.  Right Ear: Hearing, tympanic membrane, external ear and ear canal normal.  Left Ear: Hearing, tympanic membrane, external ear and ear canal normal.  Nose: Nose normal. Right sinus exhibits no maxillary sinus tenderness and no frontal sinus tenderness. Left sinus exhibits no maxillary sinus tenderness and no frontal sinus tenderness.  Mouth/Throat: Uvula  is midline and oropharynx is clear and moist. No oropharyngeal exudate.  Eyes: Pupils are equal, round, and reactive to light.  Neck: Normal range of motion. Neck supple. No JVD present.  Cardiovascular: Normal rate, regular rhythm and normal heart sounds.  Pulmonary/Chest: Effort normal. No accessory muscle usage. No respiratory distress. He has no decreased breath sounds. He has wheezes (Expiratory). He has rhonchi.  Abdominal: Soft. Bowel sounds are normal. There is no tenderness.  Musculoskeletal: Normal range of motion. He exhibits no edema.  Lymphadenopathy:    He has no cervical adenopathy.  Neurological: He is alert and oriented to person, place, and time. Gait normal.  Skin: Skin is warm and dry. He is not diaphoretic. No erythema.  Psychiatric: Mood, memory, affect and judgment normal.  Nursing note and vitals reviewed.     Lab Results:  CBC    Component Value Date/Time   WBC 5.0 09/09/2017 0057   RBC 4.95 09/09/2017 0057   HGB 17.3 (H) 09/09/2017 0057   HGB 14.3 12/10/2016 1042   HCT 49.2 09/09/2017 0057   HCT 42.1 12/10/2016 1042   PLT 182 09/09/2017 0057   PLT 121 (L) 12/10/2016 1042   MCV 99.4 09/09/2017 0057   MCV 98.9 (H) 12/10/2016 1042   MCH 34.9 (H) 09/09/2017 0057   MCHC 35.2 09/09/2017 0057   RDW 13.4 09/09/2017 0057   RDW 12.6 12/10/2016 1042   LYMPHSABS 0.7 (L) 12/10/2016 1042   MONOABS 0.4 12/10/2016 1042   EOSABS 0.1 12/10/2016 1042   BASOSABS 0.0 12/10/2016 1042    BMET    Component Value Date/Time   NA 141 09/09/2017 0057   NA 137 12/10/2016 1042   K 3.9 09/09/2017 0057   K 4.4 12/10/2016 1042   CL 105 09/09/2017 0057   CO2 27 09/09/2017 0057   CO2 28 12/10/2016 1042   GLUCOSE 100 (H) 09/09/2017 0057   GLUCOSE 96 12/10/2016 1042   BUN 13 09/09/2017 0057   BUN 9.7 12/10/2016 1042   CREATININE 1.06 09/09/2017 0057   CREATININE 0.70 03/17/2017 1032   CREATININE 0.8 12/10/2016 1042   CALCIUM 9.3 09/09/2017 0057   CALCIUM 8.7 12/10/2016  1042   GFRNONAA >60 09/09/2017 0057   GFRAA >60 09/09/2017 0057    BNP No results found for: BNP  ProBNP No results found for: PROBNP  Imaging: Dg Chest 2 View  Result Date: 09/22/2017 CLINICAL DATA:  Cough, chest congestion, and shortness of breath for the past 6 days. History of COPD, HIV, MAI with left lung mass. Smoker. EXAM: CHEST - 2 VIEW COMPARISON:  PA and lateral chest x-ray of September 09, 2017 FINDINGS: The lungs are mildly hyperinflated with hemidiaphragm flattening. There is a  are stable coarse lung markings in the left upper lobe with evidence of some cavitation. There is patchy airspace opacity in the right mid lung little changed from 3 September but new from January 2019. The heart and pulmonary vascularity are normal. The bony thorax exhibits no acute abnormality. IMPRESSION: COPD and smoking related changes. Stable infiltrates in the left upper lobe and right mid lung worrisome for suspected MAI infection. Electronically Signed   By: David  Martinique M.D.   On: 09/22/2017 12:47   Dg Chest 2 View  Result Date: 09/09/2017 CLINICAL DATA:  Mid chest pain this morning. Smoker. Shingles in the left anterior chest. EXAM: CHEST - 2 VIEW COMPARISON:  Chest CT 04/22/2017, CXR 02/05/2017 FINDINGS: Thick-walled cavitation in the left upper lobe with adjacent coarsened interstitial lung markings and fibrosis likely reflecting stigmata of granulomatous disease or mycobacterial infection. Emphysematous hyperinflation of the lungs with peribronchial thickening is redemonstrated. No new pulmonary consolidation, effusion mass. Heart and mediastinal contours are stable. No acute fracture or bone destruction. IMPRESSION: 1. Emphysematous hyperinflation of the lungs. 2. Redemonstration of chronic cavitation in the left upper lobe with coarsened interstitial lung markings and fibrosis favored to represent nontuberculous mycobacterial infection though difficult to entirely exclude tuberculosis.  Electronically Signed   By: Ashley Royalty M.D.   On: 09/09/2017 02:15      Assessment & Plan:   Pleasant 57 year old patient presenting today for acute visit.  I am concerned about Mr. general.  Will get chest x-ray today as well as will start next and short prednisone taper for COPD exacerbation.  Anoro Ellipta samples provided today for patient.  We will also place a rescue inhaler for the patient.  Patient will need to return back in 4 to 6 weeks to ensure patient is improving on this regimen.  I have informed the patient to contact infectious disease as well to let them know that he has been with these antibiotics and with the prednisone taper.  Emphasized the importance of the patient continue to work on stopping smoking.  Patient reports that he will try to cut smoking down to 5 cigarettes a day.  Patient was previously smoking 1.5 packs per day.   Consider formal pulmonary function testing if stable at next office visit.  Lung mass Chest x-ray today Continue follow-up with infectious disease  HIV (human immunodeficiency virus infection) ([place]) Continue follow-up with infectious disease  Tobacco abuse We recommend that you stop smoking.  Discussion patient agrees to cut smoking down to 5 cigarettes a day.  Smoking Cessation Resources:  1 800 QUIT NOW  >>> Patient to call this resource and utilize it to help support her quit smoking >>> Keep up your hard work with stopping smoking  You can also contact the [facility] >>>For smoking cessation classes call [phone]  We do not recommend using e-cigarettes as a form of stopping smoking      GOLD COPD II B Anoro Ellipta  >>> Take 1 puff daily in the morning right when you wake up >>>Rinse your mouth out after use >>>This is a daily maintenance inhaler, NOT a rescue inhaler >>>Contact our office if you are having difficulties affording or obtaining this medication >>>It is important for you to be able to  take this daily and not miss any doses >>> Samples provided today  We will send prescription for rescue inhaler today   Chest Xray today   Doxycycline >>> 1 100 mg tablet every 12 hours for 7 days >>>  take with food  >>>wear sunscreen   Prednisone 77m tablet  >>>4 tabs for 2 days, then 3 tabs for 2 days, 2 tabs for 2 days, then 1 tab for 2 days, then stop >>>take with food  >>>take in the morning    Hold flu vaccine today  Continue follow-up with infectious disease  Follow-up with our office in 4 to 6 weeks   Note your daily symptoms > remember "red flags" for COPD:   >>>Increase in cough >>>increase in sputum production >>>increase in shortness of breath or activity  intolerance.   If you notice these symptoms, please call the office to be seen.   We recommend that you stop smoking.   Smoking Cessation Resources:  1 800 QUIT NOW  >>> Patient to call this resource and utilize it to help support her quit smoking >>> Keep up your hard work with stopping smoking  You can also contact the [facility]>>>For smoking cessation classes call [phone] We do not recommend using e-cigarettes as a form of stopping smoking      BLauraine Rinne NP 09/22/2017

## 2017-09-22 ENCOUNTER — Ambulatory Visit (INDEPENDENT_AMBULATORY_CARE_PROVIDER_SITE_OTHER)
Admission: RE | Admit: 2017-09-22 | Discharge: 2017-09-22 | Disposition: A | Discharge status: Home / Self Care | Payer: Self-pay | Source: Ambulatory Visit | Attending: Pulmonary Disease | Admitting: Pulmonary Disease

## 2017-09-22 ENCOUNTER — Encounter: Payer: Self-pay | Admitting: Pulmonary Disease

## 2017-09-22 ENCOUNTER — Ambulatory Visit (INDEPENDENT_AMBULATORY_CARE_PROVIDER_SITE_OTHER): Payer: Self-pay | Admitting: Pulmonary Disease

## 2017-09-22 VITALS — BP 138/82 | HR 90 | Temp 98.3°F | Ht 68.5 in | Wt 132.2 lb

## 2017-09-22 DIAGNOSIS — J439 Emphysema, unspecified: Secondary | ICD-10-CM

## 2017-09-22 DIAGNOSIS — B20 Human immunodeficiency virus [HIV] disease: Secondary | ICD-10-CM | POA: Insufficient documentation

## 2017-09-22 DIAGNOSIS — R918 Other nonspecific abnormal finding of lung field: Secondary | ICD-10-CM

## 2017-09-22 DIAGNOSIS — Z720 Tobacco use: Secondary | ICD-10-CM

## 2017-09-22 DIAGNOSIS — F17210 Nicotine dependence, cigarettes, uncomplicated: Secondary | ICD-10-CM

## 2017-09-22 MED ORDER — ALBUTEROL SULFATE HFA 108 (90 BASE) MCG/ACT IN AERS: 2.0000 | INHALATION_SPRAY | Freq: Four times a day (QID) | RESPIRATORY_TRACT | 6 refills | Status: DC | PRN

## 2017-09-22 MED ORDER — UMECLIDINIUM-VILANTEROL 62.5-25 MCG/INH IN AEPB: 1.0000 | INHALATION_SPRAY | Freq: Every day | RESPIRATORY_TRACT | 0 refills | Status: DC

## 2017-09-22 MED ORDER — DOXYCYCLINE HYCLATE 100 MG PO TABS: 100.0000 mg | ORAL_TABLET | Freq: Two times a day (BID) | ORAL | 0 refills | Status: DC

## 2017-09-22 MED ORDER — PREDNISONE 10 MG PO TABS: ORAL_TABLET | ORAL | 0 refills | Status: DC

## 2017-09-22 NOTE — Patient Instructions (Addendum)
Anoro Ellipta  >>> Take 1 puff daily in the morning right when you wake up >>>Rinse your mouth out after use >>>This is a daily maintenance inhaler, NOT a rescue inhaler >>>Contact our office if you are having difficulties affording or obtaining this medication >>>It is important for you to be able to take this daily and not miss any doses >>> Samples provided today  We will send prescription for rescue inhaler today   Chest Xray today   Doxycycline >>> 1 100 mg tablet every 12 hours for 7 days >>>take with food  >>>wear sunscreen   Prednisone 10mg  tablet  >>>4 tabs for 2 days, then 3 tabs for 2 days, 2 tabs for 2 days, then 1 tab for 2 days, then stop >>>take with food  >>>take in the morning    Hold flu vaccine today  Continue follow-up with infectious disease  Follow-up with our office in 4 to 6 weeks   Note your daily symptoms > remember "red flags" for COPD:   >>>Increase in cough >>>increase in sputum production >>>increase in shortness of breath or activity  intolerance.   If you notice these symptoms, please call the office to be seen.   We recommend that you stop smoking.   Smoking Cessation Resources:  1 800 QUIT NOW  >>> Patient to call this resource and utilize it to help support her quit smoking >>> Keep up your hard work with stopping smoking  You can also contact the [facility] >>>For smoking cessation classes call [phone]  We do not recommend using e-cigarettes as a form of stopping smoking        It is flu season:   >>>Remember to be washing your hands regularly, using hand sanitizer, be careful to use around herself with has contact with people who are sick will increase her chances of getting sick yourself. >>> Best ways to protect herself from the flu: Receive the yearly flu vaccine, practice good hand hygiene washing with soap and also using hand sanitizer when available, eat a nutritious meals, get adequate rest,  hydrate appropriately  Please contact the office if your symptoms worsen or you have concerns that you are not improving.   Thank you for choosing [facility] for your healthcare, and for allowing [place] to partner with you on your healthcare journey. I am thankful to be able to provide care to you today.   [name] FNP-C  Chronic Obstructive Pulmonary Disease Chronic obstructive pulmonary disease (COPD) is a long-term (chronic) lung problem. When you have COPD, it is hard for air to get in and out of your lungs. The way your lungs work will never return to normal. Usually the condition gets worse over time. There are things you can do to keep yourself as healthy as possible. Your doctor may treat your condition with:  Medicines.  Quitting smoking, if you smoke.  Rehabilitation. This may involve a team of specialists.  Oxygen.  Exercise and changes to your diet.  Lung surgery.  Comfort measures (palliative care).  Follow these instructions at home: Medicines  Take over-the-counter and prescription medicines only as told by your doctor.  Talk to your doctor before taking any cough or allergy medicines. You may need to avoid medicines that cause your lungs to be dry. Lifestyle  If you smoke, stop. Smoking makes the problem worse. If you need help quitting, ask your doctor.  Avoid being around things that make your breathing worse. This may include smoke,  chemicals, and fumes.  Stay active, but remember to also rest.  Learn and use tips on how to relax.  Make sure you get enough sleep. Most adults need at least 7 hours a night.  Eat healthy foods. Eat smaller meals more often. Rest before meals. Controlled breathing  Learn and use tips on how to control your breathing as told by your doctor. Try: ? Breathing in (inhaling) through your nose for 1 second. Then, pucker your lips and breath out (exhale) through your lips for 2 seconds. ? Putting one hand on your  belly (abdomen). Breathe in slowly through your nose for 1 second. Your hand on your belly should move out. Pucker your lips and breathe out slowly through your lips. Your hand on your belly should move in as you breathe out. Controlled coughing  Learn and use controlled coughing to clear mucus from your lungs. The steps are: 1. Lean your head a little forward. 2. Breathe in deeply. 3. Try to hold your breath for 3 seconds. 4. Keep your mouth slightly open while coughing 2 times. 5. Spit any mucus out into a tissue. 6. Rest and do the steps again 1 or 2 times as needed. General instructions  Make sure you get all the shots (vaccines) that your doctor recommends. Ask your doctor about a flu shot and a pneumonia shot.  Use oxygen therapy and therapy to help improve your lungs (pulmonary rehabilitation) if told by your doctor. If you need home oxygen therapy, ask your doctor if you should buy a tool to measure your oxygen level (oximeter).  Make a COPD action plan with your doctor. This helps you know what to do if you feel worse than usual.  Manage any other conditions you have as told by your doctor.  Avoid going outside when it is very hot, cold, or humid.  Avoid people who have a sickness you can catch (contagious).  Keep all follow-up visits as told by your doctor. This is important. Contact a doctor if:  You cough up more mucus than usual.  There is a change in the color or thickness of the mucus.  It is harder to breathe than usual.  Your breathing is faster than usual.  You have trouble sleeping.  You need to use your medicines more often than usual.  You have trouble doing your normal activities such as getting dressed or walking around the house. Get help right away if:  You have shortness of breath while resting.  You have shortness of breath that stops you from: ? Being able to talk. ? Doing normal activities.  Your chest hurts for longer than 5  minutes.  Your skin color is more blue than usual.  Your pulse oximeter shows that you have low oxygen for longer than 5 minutes.  You have a fever.  You feel too tired to breathe normally. Summary  Chronic obstructive pulmonary disease (COPD) is a long-term lung problem.  The way your lungs work will never return to normal. Usually the condition gets worse over time. There are things you can do to keep yourself as healthy as possible.  Take over-the-counter and prescription medicines only as told by your doctor.  If you smoke, stop. Smoking makes the problem worse. This information is not intended to replace advice given to you by your health care provider. Make sure you discuss any questions you have with your health care provider. Document Released: 06/12/2007 Document Revised: 06/01/2015 Document Reviewed: 08/20/2012 Elsevier Interactive Patient  Education  2017 [place] with Quitting Smoking Quitting smoking is a physical and mental challenge. You will face cravings, withdrawal symptoms, and temptation. Before quitting, work with your health care provider to make a plan that can help you cope. Preparation can help you quit and keep you from giving in. How can I cope with cravings? Cravings usually last for 5-10 minutes. If you get through it, the craving will pass. Consider taking the following actions to help you cope with cravings:  Keep your mouth busy: ? Chew sugar-free gum. ? Suck on hard candies or a straw. ? Brush your teeth.  Keep your hands and body busy: ? Immediately change to a different activity when you feel a craving. ? Squeeze or play with a ball. ? Do an activity or a hobby, like making bead jewelry, practicing needlepoint, or working with wood. ? Mix up your normal routine. ? Take a short exercise break. Go for a quick walk or run up and down stairs. ? Spend time in public places where smoking is not allowed.  Focus on doing something kind  or helpful for someone else.  Call a friend or family member to talk during a craving.  Join a support group.  Call a quit line, such as 1-800-QUIT-NOW.  Talk with your health care provider about medicines that might help you cope with cravings and make quitting easier for you.  How can I deal with withdrawal symptoms? Your body may experience negative effects as it tries to get used to not having nicotine in the system. These effects are called withdrawal symptoms. They may include:  Feeling hungrier than normal.  Trouble concentrating.  Irritability.  Trouble sleeping.  Feeling depressed.  Restlessness and agitation.  Craving a cigarette.  To manage withdrawal symptoms:  Avoid places, people, and activities that trigger your cravings.  Remember why you want to quit.  Get plenty of sleep.  Avoid coffee and other caffeinated drinks. These may worsen some of your symptoms.  How can I handle social situations? Social situations can be difficult when you are quitting smoking, especially in the first few weeks. To manage this, you can:  Avoid parties, bars, and other social situations where people might be smoking.  Avoid alcohol.  Leave right away if you have the urge to smoke.  Explain to your family and friends that you are quitting smoking. Ask for understanding and support.  Plan activities with friends or family where smoking is not an option.  What are some ways I can cope with stress? Wanting to smoke may cause stress, and stress can make you want to smoke. Find ways to manage your stress. Relaxation techniques can help. For example:  Breathe slowly and deeply, in through your nose and out through your mouth.  Listen to soothing, relaxing music.  Talk with a family member or friend about your stress.  Light a candle.  Soak in a bath or take a shower.  Think about a peaceful place.  What are some ways I can prevent weight gain? Be aware that many  people gain weight after they quit smoking. However, not everyone does. To keep from gaining weight, have a plan in place before you quit and stick to the plan after you quit. Your plan should include:  Having healthy snacks. When you have a craving, it may help to: ? Eat plain popcorn, crunchy carrots, celery, or other cut vegetables. ? Chew sugar-free gum.  Changing how you eat: ?  Eat small portion sizes at meals. ? Eat 4-6 small meals throughout the day instead of 1-2 large meals a day. ? Be mindful when you eat. Do not watch television or do other things that might distract you as you eat.  Exercising regularly: ? Make time to exercise each day. If you do not have time for a long workout, do short bouts of exercise for 5-10 minutes several times a day. ? Do some form of strengthening exercise, like weight lifting, and some form of aerobic exercise, like running or swimming.  Drinking plenty of water or other low-calorie or no-calorie drinks. Drink 6-8 glasses of water daily, or as much as instructed by your health care provider.  Summary  Quitting smoking is a physical and mental challenge. You will face cravings, withdrawal symptoms, and temptation to smoke again. Preparation can help you as you go through these challenges.  You can cope with cravings by keeping your mouth busy (such as by chewing gum), keeping your body and hands busy, and making calls to family, friends, or a helpline for people who want to quit smoking.  You can cope with withdrawal symptoms by avoiding places where people smoke, avoiding drinks with caffeine, and getting plenty of rest.  Ask your health care provider about the different ways to prevent weight gain, avoid stress, and handle social situations. This information is not intended to replace advice given to you by your health care provider. Make sure you discuss any questions you have with your health care provider. Document Released: 12/22/2015 Document  Revised: 12/22/2015 Document Reviewed: 12/22/2015 Elsevier Interactive Patient Education  [employer].

## 2017-09-22 NOTE — Assessment & Plan Note (Signed)
Anoro Ellipta  >>> Take 1 puff daily in the morning right when you wake up >>>Rinse your mouth out after use >>>This is a daily maintenance inhaler, NOT a rescue inhaler >>>Contact our office if you are having difficulties affording or obtaining this medication >>>It is important for you to be able to take this daily and not miss any doses >>> Samples provided today  We will send prescription for rescue inhaler today   Chest Xray today   Doxycycline >>> 1 100 mg tablet every 12 hours for 7 days >>>take with food  >>>wear sunscreen   Prednisone 10mg  tablet  >>>4 tabs for 2 days, then 3 tabs for 2 days, 2 tabs for 2 days, then 1 tab for 2 days, then stop >>>take with food  >>>take in the morning    Hold flu vaccine today  Continue follow-up with infectious disease  Follow-up with our office in 4 to 6 weeks   Note your daily symptoms > remember "red flags" for COPD:   >>>Increase in cough >>>increase in sputum production >>>increase in shortness of breath or activity  intolerance.   If you notice these symptoms, please call the office to be seen.   We recommend that you stop smoking.   Smoking Cessation Resources:  1 800 QUIT NOW  >>> Patient to call this resource and utilize it to help support her quit smoking >>> Keep up your hard work with stopping smoking  You can also contact the [facility] >>>For smoking cessation classes call [phone]  We do not recommend using e-cigarettes as a form of stopping smoking

## 2017-09-22 NOTE — Assessment & Plan Note (Signed)
We recommend that you stop smoking.  Discussion patient agrees to cut smoking down to 5 cigarettes a day.  Smoking Cessation Resources:  1 800 QUIT NOW  >>> Patient to call this resource and utilize it to help support her quit smoking >>> Keep up your hard work with stopping smoking  You can also contact the [facility] >>>For smoking cessation classes call [phone]  We do not recommend using e-cigarettes as a form of stopping smoking

## 2017-09-22 NOTE — Assessment & Plan Note (Signed)
Chest x-ray today Continue follow-up with infectious disease

## 2017-09-22 NOTE — Assessment & Plan Note (Addendum)
Continue follow-up with infectious disease Continue [place] Notify infectious disease that you are starting as well as a prednisone taper for COPD exacerbation

## 2017-09-23 ENCOUNTER — Telehealth: Payer: Self-pay | Admitting: Pulmonary Disease

## 2017-09-23 NOTE — Telephone Encounter (Signed)
Called and spoke with patient, made patient aware NP Warner Mccreedy was okay with patient taking mucinex as needed to help get rid of mucous. Voiced understanding. Nothing further needed at this time.

## 2017-09-23 NOTE — Progress Notes (Signed)
Cxray results have come back showing persistent known left upper lobe infiltrate and right mid lung.  Continue with plan of care as discussed in office. 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   [name] FNP

## 2017-09-23 NOTE — Telephone Encounter (Signed)
Pt is okay to take regular mucinex as needed.   [name] FNP

## 2017-09-23 NOTE — Progress Notes (Signed)
LMTCB x1 on preferred phone number listed for patient.  

## 2017-09-23 NOTE — Telephone Encounter (Signed)
Spoke with pt, advised pt of results and he verbalized understanding. He would like to know if he can take Mucinex to help get the mucus up. He wants to make sure it doesn't interact with any of his medications. Aaron Edelman please advise.      Notes recorded by Lauraine Rinne, NP on 09/23/2017 at 9:52 AM EDT Cxray results have come back showing persistent known left upper lobe infiltrate and right mid lung.  Continue with plan of care as discussed in office. 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   [name] FNP

## 2017-09-24 NOTE — Progress Notes (Signed)
Reviewed, agree 

## 2017-10-20 NOTE — Progress Notes (Signed)
_0  ID: Calvin Schroeder, male    DOB: March 29, 1960, 57 y.o.   MRN: [id number]  Chief Complaint  Patient presents with  . Follow-up    COPD follow up     Referring provider: No ref. provider found  HPI:  57 year old male current smoker referred in March/2019 for lung mass and emphysema.  Lung mass was biopsied 02/07/2017 by Dr. 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  Patient was also diagnosed with HIV in 2019. 03/2017 he was not tolerating MAI treatment due to GI side effects.  Hope to restart treatment when CD4 counts are above 200.  PMH: HIV positive (followed by Dr. [name] with infectious disease) Smoker/ Smoking History: Current smoker. Maintenance: Anoro Ellipta Pt of: Dr. Lake Bells  09/22/2017  - Visit -BM 57 year old patient seen today for progressive shortness of breath over the last 6 days.  Patient also with cough for 6 days.  Patient reports that after coughing for 5 to 10 minutes he is able to produce clear to yellow mucus.  Patient with yellow nasal drainage.  Patient reports that the cough is worse when he sleeping at night.  Patient admits that he did restart his Anoro Ellipta inhaler 3 days ago and that this is helped slightly.  Patient had previously stopped the Anoro Ellipta inhaler because he did not feel like it was helping his breathing.Patient to follow-up with infectious disease Dr. [name] next month. MMRC - 2.  Plan: doxy, pred taper, follow up in 4 weeks, keep follow up with ID, chest xray today   10/21/2017  - Visit   57 year old patient seen today for follow-up visit.  Patient reports been doing well since last being seen.  Patient has completed antibiotics as well as prednisone taper.  Patient is looking to receive the flu vaccine today if able.  Patient reports that he is decreased his smoking down to 1 pack/day from 2 packs.  Patient is actively working to stop smoking.  Patient reports that he is set a quit date of  12/23/2017 which is his birthday.  Patient reports that he has an appointment to follow-up with infectious disease on 10/28/2017.  Patient also reports that he is going to complete a chest CT today (10/21/2017) as well as a follow-up appointment with Dr. [name] today.  We are waiting to consider restarting MAI therapy as patient had this had to attempts to start therapy the first was time was stopped due to GI side effects.  The second time patient stopped due to extreme extremity pain of lower arms as well as lower legs.  Patient then was diagnosed with HIV in March/2019.  Were waiting for CD4 counts 2,.  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 History of COPD, HIV, MAI with left lung mass. Smoker. EXAM: CHEST - 2 VIEW COMPARISON:  PA and lateral chest x-ray of September 09, 2017 FINDINGS: The lungs  are mildly hyperinflated with hemidiaphragm flattening. There is a are stable coarse lung markings in the left upper lobe with evidence of some cavitation. There is patchy airspace opacity in the right mid lung little changed from 3 September but new from January 2019. The heart and pulmonary vascularity are normal. The bony thorax exhibits no acute abnormality. IMPRESSION: COPD and smoking related changes. Stable infiltrates in the left upper lobe and right mid lung worrisome for suspected MAI infection. Electronically Signed   By: David  Martinique M.D.   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 Vicodin [Hydrocodone-Acetaminophen] Itching and Rash    Immunization History  Administered Date(s) Administered  . Hepatitis A, Adult 04/09/2017, 07/28/2017  . Influenza,inj,Quad PF,6+ Mos 04/09/2017, 10/21/2017  . Pneumococcal Conjugate-13 06/09/2017  . Pneumococcal Polysaccharide-23 04/09/2017    Past Medical History:  Diagnosis Date  . Back pain   . COPD (chronic obstructive pulmonary disease) ([facility])    emphysema   . Dyspnea    with exertion   .  HIV (human immunodeficiency virus infection) ([facility])   . 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 Pneumonia    11/2016  . Tobacco abuse 06/10/2013    Tobacco History: Social History   Tobacco Use  Smoking Status Current Every Day Smoker  . Packs/day: 1.00  . Years: 40.00  . Pack years: 40.00  Smokeless Tobacco Never Used  Tobacco Comment   1 pack per day currently, quit date set for 12/23/17 per pt    Ready to quit: Yes Counseling given: Yes Comment: 1 pack per day currently, quit date set for 12/23/17 per pt   Patient reports he is down to 1 pack of cigarettes per day.  Patient reports his quit date that he is set is 12/23/2017.  Smoking assessment and cessation counseling  Patient currently smoking: 1 pack per day  I have advised the patient to quit/stop smoking as soon as possible due to high risk for multiple medical problems.  It will also be very difficult for [place] to manage patient's  respiratory symptoms and status if we continue to expose her lungs to a known irritant.  We do not advise e-cigarettes  as a form of stopping smoking.  Patient is willing to quit smoking. Quit date: 12/23/17  I have advised the patient that we can assist and have options of nicotine replacement therapy, provided smoking cessation education today, provided smoking cessation counseling, and provided cessation resources.  Follow-up next office visit office visit for assessment of smoking cessation.  Smoking cessation counseling advised for: 6 min    Outpatient Encounter Medications as of 10/21/2017  Medication Sig  . albuterol (PROVENTIL HFA;VENTOLIN HFA) 108 (90 Base) MCG/ACT inhaler Inhale 2 puffs into the lungs every 6 (six) hours as needed for wheezing or shortness of breath.  . dolutegravir (TIVICAY) 50 MG tablet Take 1 tablet (50 mg total) by mouth daily.  Marland Kitchen emtricitabine-tenofovir AF (DESCOVY) 200-25 MG tablet Take 1 tablet by mouth daily.  Marland Kitchen umeclidinium-vilanterol (ANORO ELLIPTA) 62.5-25 MCG/INH  AEPB Inhale 1 puff into the lungs daily.  . [DISCONTINUED] umeclidinium-vilanterol (ANORO ELLIPTA) 62.5-25 MCG/INH AEPB Inhale 1 puff into the lungs daily.  Marland Kitchen umeclidinium-vilanterol (ANORO ELLIPTA) 62.5-25 MCG/INH AEPB Inhale 1 puff into the lungs daily.  . [DISCONTINUED] doxycycline (VIBRA-TABS) 100 MG tablet Take 1 tablet (100 mg total) by mouth 2 (two) times daily. (Patient not taking: Reported on 10/21/2017)  . [DISCONTINUED] predniSONE (DELTASONE) 10 MG tablet 4 tabs for 2 days, then 3 tabs for 2 days, 2 tabs for 2 days, then 1 tab for 2 days, then stop (Patient not taking: Reported on 10/21/2017)   No facility-administered encounter medications on file as of 10/21/2017.      Review of Systems  Review of Systems  Constitutional: Negative for activity change, chills, fatigue, fever and unexpected weight change.  HENT: Negative for postnasal drip, rhinorrhea, sinus pressure and sinus pain.   Eyes: Negative.   Respiratory: Negative for cough, shortness of breath and wheezing.   Cardiovascular: Negative for chest pain and palpitations.  Gastrointestinal: Negative for constipation, diarrhea, nausea and vomiting.  Endocrine: Negative.   Musculoskeletal: Negative.   Skin: Negative.   Neurological: Negative for dizziness and headaches.  Psychiatric/Behavioral: Negative.  Negative for dysphoric mood. The patient is not nervous/anxious.   All other systems reviewed and are negative.    Physical Exam  BP 118/70 (BP Location: Left Arm, Cuff Size: Normal)   Pulse (!) 54   Ht _0  (1.778 m)   Wt 136 lb (61.7 kg)   SpO2 93%   BMI 19.51 kg/m   Wt Readings from Last 5 Encounters:  10/21/17 136 lb (61.7 kg)  09/22/17 132 lb 3.2 oz (60 kg)  09/09/17 130 lb (59 kg)  08/06/17 135 lb 4.8 oz (61.4 kg)  07/28/17 131 lb (59.4 kg)     Physical Exam  Constitutional: He is oriented to person, place, and time and well-developed, well-nourished, and in no distress. Vital signs are normal. No  distress.  HENT:  Head: Normocephalic and atraumatic.  Right Ear: Hearing, tympanic membrane, external ear and ear canal normal.  Left Ear: Hearing, tympanic membrane, external ear and ear canal normal.  Nose: Nose normal. Right sinus exhibits no maxillary sinus tenderness and no frontal sinus tenderness. Left sinus exhibits no maxillary sinus tenderness and no frontal sinus tenderness.  Mouth/Throat: Uvula is midline and oropharynx is clear and moist. No oropharyngeal exudate.  Eyes: Pupils are equal, round, and reactive to light.  Neck: Normal range of motion. Neck supple. No JVD present.  Cardiovascular: Normal rate, regular rhythm and normal heart sounds.  Pulmonary/Chest: Effort normal and breath sounds normal. No accessory muscle  usage. No respiratory distress. He has no decreased breath sounds. He has no wheezes. He has no rhonchi.  Abdominal: Soft. Bowel sounds are normal. There is no tenderness.  Musculoskeletal: Normal range of motion. He exhibits no edema.  Lymphadenopathy:    He has no cervical adenopathy.  Neurological: He is alert and oriented to person, place, and time. Gait normal.  Skin: Skin is warm and dry. He is not diaphoretic. No erythema.  Psychiatric: Mood, memory, affect and judgment normal.  Nursing note and vitals reviewed.     Lab Results:  CBC    Component Value Date/Time   WBC 5.0 09/09/2017 0057   RBC 4.95 09/09/2017 0057   HGB 17.3 (H) 09/09/2017 0057   HGB 14.3 12/10/2016 1042   HCT 49.2 09/09/2017 0057   HCT 42.1 12/10/2016 1042   PLT 182 09/09/2017 0057   PLT 121 (L) 12/10/2016 1042   MCV 99.4 09/09/2017 0057   MCV 98.9 (H) 12/10/2016 1042   MCH 34.9 (H) 09/09/2017 0057   MCHC 35.2 09/09/2017 0057   RDW 13.4 09/09/2017 0057   RDW 12.6 12/10/2016 1042   LYMPHSABS 0.7 (L) 12/10/2016 1042   MONOABS 0.4 12/10/2016 1042   EOSABS 0.1 12/10/2016 1042   BASOSABS 0.0 12/10/2016 1042    BMET    Component Value Date/Time   NA 141 09/09/2017  0057   NA 137 12/10/2016 1042   K 3.9 09/09/2017 0057   K 4.4 12/10/2016 1042   CL 105 09/09/2017 0057   CO2 27 09/09/2017 0057   CO2 28 12/10/2016 1042   GLUCOSE 100 (H) 09/09/2017 0057   GLUCOSE 96 12/10/2016 1042   BUN 13 09/09/2017 0057   BUN 9.7 12/10/2016 1042   CREATININE 1.06 09/09/2017 0057   CREATININE 0.70 03/17/2017 1032   CREATININE 0.8 12/10/2016 1042   CALCIUM 9.3 09/09/2017 0057   CALCIUM 8.7 12/10/2016 1042   GFRNONAA >60 09/09/2017 0057   GFRAA >60 09/09/2017 0057    BNP No results found for: BNP  ProBNP No results found for: PROBNP    Assessment & Plan:   Pleasant 57 year old patient seen office visit today.  Patient doing better since COPD exacerbation at last office visit.  Patient to complete follow-up with infectious disease as well as with Dr. [name].  Patient also to complete CT of chest today.   Patient have follow-up with Dr. Lake Bells in 6 to 8 weeks.  We could consider restarting MAI treatment if CD4 counts are remaining elevated above 200 after completion of infectious disease appointment on 10/28/2017.  GOLD COPD II B Continue Anoro Ellipta  >>> Take 1 puff daily in the morning right when you wake up >>>Rinse your mouth out after use >>>This is a daily maintenance inhaler, NOT a rescue inhaler >>>Contact our office if you are having difficulties affording or obtaining this medication >>>It is important for you to be able to take this daily and not miss any doses  Flu shot today   Please pick up rescue inhaler   Only use your albuterol as a rescue medication to be used if you can't catch your breath by resting or doing a relaxed purse lip breathing pattern.  - The less you use it, the better it will work when you need it. - Ok to use up to 2 puffs  every 4 hours if you must but call for immediate appointment if use goes up over your usual need - Don't leave home without it !!  (think of  it like the spare tire for your car)   Continue  follow-up with infectious disease Continue follow-up with surgery-[facility] Complete CT of chest today  We recommend that you stop smoking.   Smoking Cessation Resources:  1 800 QUIT NOW  >>> Patient to call this resource and utilize it to help support her quit smoking >>> Keep up your hard work with stopping smoking  You can also contact the [facility] >>>For smoking cessation classes call [phone]  We do not recommend using e-cigarettes as a form of stopping smoking  Follow up in 6-8 weeks with Dr. Lake Bells     Tobacco abuse Flu shot today  We recommend that you stop smoking.   Smoking Cessation Resources:  1 800 QUIT NOW  >>> Patient to call this resource and utilize it to help support her quit smoking >>> Keep up your hard work with stopping smoking  You can also contact the [facility] >>>For smoking cessation classes call [phone]  We do not recommend using e-cigarettes as a form of stopping smoking  Follow up in 6-8 weeks with Dr. 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 Lake Bells     HIV (human immunodeficiency virus infection) [facility]) Continue follow-up with infectious disease Continue [place], NP 10/21/2017

## 2017-10-21 ENCOUNTER — Ambulatory Visit (INDEPENDENT_AMBULATORY_CARE_PROVIDER_SITE_OTHER): Payer: Self-pay | Admitting: Thoracic Surgery (Cardiothoracic Vascular Surgery)

## 2017-10-21 ENCOUNTER — Ambulatory Visit (INDEPENDENT_AMBULATORY_CARE_PROVIDER_SITE_OTHER): Payer: Self-pay | Admitting: Pulmonary Disease

## 2017-10-21 ENCOUNTER — Other Ambulatory Visit: Payer: Self-pay

## 2017-10-21 ENCOUNTER — Ambulatory Visit
Admission: RE | Admit: 2017-10-21 | Discharge: 2017-10-21 | Disposition: A | Discharge status: Home / Self Care | Payer: Self-pay | Source: Ambulatory Visit | Attending: Thoracic Surgery (Cardiothoracic Vascular Surgery) | Admitting: Thoracic Surgery (Cardiothoracic Vascular Surgery)

## 2017-10-21 ENCOUNTER — Encounter: Payer: Self-pay | Admitting: Pulmonary Disease

## 2017-10-21 ENCOUNTER — Encounter: Payer: Self-pay | Admitting: Thoracic Surgery (Cardiothoracic Vascular Surgery)

## 2017-10-21 VITALS — BP 130/80 | HR 79 | Resp 18 | Ht 70.0 in | Wt 137.2 lb

## 2017-10-21 DIAGNOSIS — Z23 Encounter for immunization: Secondary | ICD-10-CM

## 2017-10-21 DIAGNOSIS — F17210 Nicotine dependence, cigarettes, uncomplicated: Secondary | ICD-10-CM

## 2017-10-21 DIAGNOSIS — R918 Other nonspecific abnormal finding of lung field: Secondary | ICD-10-CM

## 2017-10-21 DIAGNOSIS — R911 Solitary pulmonary nodule: Secondary | ICD-10-CM

## 2017-10-21 DIAGNOSIS — J439 Emphysema, unspecified: Secondary | ICD-10-CM

## 2017-10-21 DIAGNOSIS — A310 Pulmonary mycobacterial infection: Secondary | ICD-10-CM

## 2017-10-21 DIAGNOSIS — Z720 Tobacco use: Secondary | ICD-10-CM

## 2017-10-21 DIAGNOSIS — B20 Human immunodeficiency virus [HIV] disease: Secondary | ICD-10-CM

## 2017-10-21 MED ORDER — UMECLIDINIUM-VILANTEROL 62.5-25 MCG/INH IN AEPB: 1.0000 | INHALATION_SPRAY | Freq: Every day | RESPIRATORY_TRACT | 0 refills | Status: DC

## 2017-10-21 NOTE — Patient Instructions (Addendum)
Continue Anoro Ellipta  >>> Take 1 puff daily in the morning right when you wake up >>>Rinse your mouth out after use >>>This is a daily maintenance inhaler, NOT a rescue inhaler >>>Contact our office if you are having difficulties affording or obtaining this medication >>>It is important for you to be able to take this daily and not miss any doses  Flu shot today   Please pick up rescue inhaler   Only use your albuterol as a rescue medication to be used if you can't catch your breath by resting or doing a relaxed purse lip breathing pattern.  - The less you use it, the better it will work when you need it. - Ok to use up to 2 puffs  every 4 hours if you must but call for immediate appointment if use goes up over your usual need - Don't leave home without it !!  (think of it like the spare tire for your car)   Continue follow-up with infectious disease Continue follow-up with surgery-Hendrickson Complete CT of chest today  We recommend that you stop smoking.   Smoking Cessation Resources:  1 800 QUIT NOW  >>> Patient to call this resource and utilize it to help support her quit smoking >>> Keep up your hard work with stopping smoking  You can also contact the [facility] >>>For smoking cessation classes call [phone]  We do not recommend using e-cigarettes as a form of stopping smoking   Follow up in 6-8 weeks with Dr. Lake Bells    November/2019 we will be moving! We will no longer be at our [place] location.  Be on the look out for a post card/mailer to let you know we have officially moved.  Our new address and phone number will be:  [address] [place], [place] [address] Telephone number: [phone]  It is flu season:   >>>Remember to be washing your hands regularly, using hand sanitizer, be careful to use around herself with has contact with people who are sick will increase her chances of getting sick yourself. >>> Best ways to protect  herself from the flu: Receive the yearly flu vaccine, practice good hand hygiene washing with soap and also using hand sanitizer when available, eat a nutritious meals, get adequate rest, hydrate appropriately   Please contact the office if your symptoms worsen or you have concerns that you are not improving.   Thank you for choosing [facility] for your healthcare, and for allowing [place] to partner with you on your healthcare journey. I am thankful to be able to provide care to you today.   [name] FNP-C   Coping with Quitting Smoking Quitting smoking is a physical and mental challenge. You will face cravings, withdrawal symptoms, and temptation. Before quitting, work with your health care provider to make a plan that can help you cope. Preparation can help you quit and keep you from giving in. How can I cope with cravings? Cravings usually last for 5-10 minutes. If you get through it, the craving will pass. Consider taking the following actions to help you cope with cravings:  Keep your mouth busy: ? Chew sugar-free gum. ? Suck on hard candies or a straw. ? Brush your teeth.  Keep your hands and body busy: ? Immediately change to a different activity when you feel a craving. ? Squeeze or play with a ball. ? Do an activity or a hobby, like making bead jewelry, practicing needlepoint, or working  with wood. ? Mix up your normal routine. ? Take a short exercise break. Go for a quick walk or run up and down stairs. ? Spend time in public places where smoking is not allowed.  Focus on doing something kind or helpful for someone else.  Call a friend or family member to talk during a craving.  Join a support group.  Call a quit line, such as 1-800-QUIT-NOW.  Talk with your health care provider about medicines that might help you cope with cravings and make quitting easier for you.  How can I deal with withdrawal symptoms? Your body may experience negative effects as it  tries to get used to not having nicotine in the system. These effects are called withdrawal symptoms. They may include:  Feeling hungrier than normal.  Trouble concentrating.  Irritability.  Trouble sleeping.  Feeling depressed.  Restlessness and agitation.  Craving a cigarette.  To manage withdrawal symptoms:  Avoid places, people, and activities that trigger your cravings.  Remember why you want to quit.  Get plenty of sleep.  Avoid coffee and other caffeinated drinks. These may worsen some of your symptoms.  How can I handle social situations? Social situations can be difficult when you are quitting smoking, especially in the first few weeks. To manage this, you can:  Avoid parties, bars, and other social situations where people might be smoking.  Avoid alcohol.  Leave right away if you have the urge to smoke.  Explain to your family and friends that you are quitting smoking. Ask for understanding and support.  Plan activities with friends or family where smoking is not an option.  What are some ways I can cope with stress? Wanting to smoke may cause stress, and stress can make you want to smoke. Find ways to manage your stress. Relaxation techniques can help. For example:  Breathe slowly and deeply, in through your nose and out through your mouth.  Listen to soothing, relaxing music.  Talk with a family member or friend about your stress.  Light a candle.  Soak in a bath or take a shower.  Think about a peaceful place.  What are some ways I can prevent weight gain? Be aware that many people gain weight after they quit smoking. However, not everyone does. To keep from gaining weight, have a plan in place before you quit and stick to the plan after you quit. Your plan should include:  Having healthy snacks. When you have a craving, it may help to: ? Eat plain popcorn, crunchy carrots, celery, or other cut vegetables. ? Chew sugar-free gum.  Changing how  you eat: ? Eat small portion sizes at meals. ? Eat 4-6 small meals throughout the day instead of 1-2 large meals a day. ? Be mindful when you eat. Do not watch television or do other things that might distract you as you eat.  Exercising regularly: ? Make time to exercise each day. If you do not have time for a long workout, do short bouts of exercise for 5-10 minutes several times a day. ? Do some form of strengthening exercise, like weight lifting, and some form of aerobic exercise, like running or swimming.  Drinking plenty of water or other low-calorie or no-calorie drinks. Drink 6-8 glasses of water daily, or as much as instructed by your health care provider.  Summary  Quitting smoking is a physical and mental challenge. You will face cravings, withdrawal symptoms, and temptation to smoke again. Preparation can help you as  you go through these challenges.  You can cope with cravings by keeping your mouth busy (such as by chewing gum), keeping your body and hands busy, and making calls to family, friends, or a helpline for people who want to quit smoking.  You can cope with withdrawal symptoms by avoiding places where people smoke, avoiding drinks with caffeine, and getting plenty of rest.  Ask your health care provider about the different ways to prevent weight gain, avoid stress, and handle social situations. This information is not intended to replace advice given to you by your health care provider. Make sure you discuss any questions you have with your health care provider. Document Released: 12/22/2015 Document Revised: 12/22/2015 Document Reviewed: 12/22/2015 Elsevier Interactive Patient Education  2018 [place].    Influenza Virus Vaccine injection What is this medicine? INFLUENZA VIRUS VACCINE (in floo EN zuh VAHY ruhs vak SEEN) helps to reduce the risk of getting influenza also known as the flu. The vaccine only helps protect you against some strains of the flu. This  medicine may be used for other purposes; ask your health care provider or pharmacist if you have questions. COMMON BRAND NAME(S): Afluria, Agriflu, Alfuria, FLUAD, Fluarix, Fluarix Quadrivalent, Flublok, Flublok Quadrivalent, FLUCELVAX, Flulaval, Fluvirin, Fluzone, Fluzone High-Dose, Fluzone Intradermal What should I tell my health care provider before I take this medicine? They need to know if you have any of these conditions: -bleeding disorder like hemophilia -fever or infection -Guillain-Barre syndrome or other neurological problems -immune system problems -infection with the human immunodeficiency virus (HIV) or AIDS -low blood platelet counts -multiple sclerosis -an unusual or allergic reaction to influenza virus vaccine, latex, other medicines, foods, dyes, or preservatives. Different brands of vaccines contain different allergens. Some may contain latex or eggs. Talk to your doctor about your allergies to make sure that you get the right vaccine. -pregnant or trying to get pregnant -breast-feeding How should I use this medicine? This vaccine is for injection into a muscle or under the skin. It is given by a health care professional. A copy of Vaccine Information Statements will be given before each vaccination. Read this sheet carefully each time. The sheet may change frequently. Talk to your healthcare provider to see which vaccines are right for you. Some vaccines should not be used in all age groups. Overdosage: If you think you have taken too much of this medicine contact a poison control center or emergency room at once. NOTE: This medicine is only for you. Do not share this medicine with others. What if I miss a dose? This does not apply. What may interact with this medicine? -chemotherapy or radiation therapy -medicines that lower your immune system like etanercept, anakinra, infliximab, and adalimumab -medicines that treat or prevent blood clots like  warfarin -phenytoin -steroid medicines like prednisone or cortisone -theophylline -vaccines This list may not describe all possible interactions. Give your health care provider a list of all the medicines, herbs, non-prescription drugs, or dietary supplements you use. Also tell them if you smoke, drink alcohol, or use illegal drugs. Some items may interact with your medicine. What should I watch for while using this medicine? Report any side effects that do not go away within 3 days to your doctor or health care professional. Call your health care provider if any unusual symptoms occur within 6 weeks of receiving this vaccine. You may still catch the flu, but the illness is not usually as bad. You cannot get the flu from the vaccine. The vaccine  will not protect against colds or other illnesses that may cause fever. The vaccine is needed every year. What side effects may I notice from receiving this medicine? Side effects that you should report to your doctor or health care professional as soon as possible: -allergic reactions like skin rash, itching or hives, swelling of the face, lips, or tongue Side effects that usually do not require medical attention (report to your doctor or health care professional if they continue or are bothersome): -fever -headache -muscle aches and pains -pain, tenderness, redness, or swelling at the injection site -tiredness This list may not describe all possible side effects. Call your doctor for medical advice about side effects. You may report side effects to FDA at 1-800-FDA-1088. Where should I keep my medicine? The vaccine will be given by a health care professional in a clinic, pharmacy, doctor's office, or other health care setting. You will not be given vaccine doses to store at home. NOTE: This sheet is a summary. It may not cover all possible information. If you have questions about this medicine, talk to your doctor, pharmacist, or health care  provider.  2018 Elsevier/Gold Standard (2014-07-15 10:07:28)

## 2017-10-21 NOTE — Progress Notes (Signed)
[address]       [place] [address]             [phone]    HPI: Calvin Schroeder returns for follow-up of a left upper lobe cavitary mass.  Calvin Schroeder is a 57 year old man with a history of heavy tobacco abuse, COPD, HIV, pulmonary MAI infection, Kaposi's sarcoma, and back pain.  He presented in the fall 2018.  He had been on a cruise to the [place].  He had a CT of the chest to rule out a pulmonary embolus which showed a complex consolidative process in the left upper lobe.  It was persistent on a repeat CT in December.  It was hypermetabolic on PET/CT.  Navigational bronchoscopy showed granuloma formation with giant cells.  Cultures grew out MAI.  He was diagnosed with HIV and started on therapy for that.  He was started on triple antibiotic therapy for his MAI but was unable to tolerate that regimen.  I last saw him in April.  There was no significant change in the cavitary mass on his CT at that time.  In the interim since his last visit he was diagnosed with Kaposi's sarcoma.  He saw Dr. Alen Blew about that.  He was not started on systemic chemotherapy.  He says that he is cut back from 2 packs a day to 1 pack a day with his smoking.  He has a chronic cough which is unchanged.  He denies any hemoptysis.  He did have a pneumonia several weeks ago treated with antibiotics.  Past Medical History:  Diagnosis Date  . Back pain   . COPD (chronic obstructive pulmonary disease) (HCC)    emphysema   . Dyspnea    with exertion   . HIV (human immunodeficiency virus infection) ([place])   . 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 Pneumonia    11/2016  . Tobacco abuse 06/10/2013    Current Outpatient Medications  Medication Sig Dispense Refill  . dolutegravir (TIVICAY) 50 MG tablet Take 1 tablet (50 mg total) by mouth daily. 30 tablet 5  . emtricitabine-tenofovir AF (DESCOVY) 200-25 MG tablet Take 1 tablet by mouth daily. 90 tablet 5  . umeclidinium-vilanterol (ANORO ELLIPTA)  62.5-25 MCG/INH AEPB Inhale 1 puff into the lungs daily. 1 each 0   No current facility-administered medications for this visit.     Physical Exam BP 130/80 (BP Location: Left Arm, Patient Position: Sitting, Cuff Size: Normal)   Pulse 79   Resp 18   Ht 5\' 10"  (1.778 m)   Wt 137 lb 3.2 oz (62.2 kg)   SpO2 96% Comment: RA  BMI 19.68 kg/m  57 year old man in no acute distress Alert and oriented x3 with no focal deficits Lungs diminished but otherwise clear bilaterally, no wheezing Cardiac regular rate and rhythm normal S1 and S2 No cervical or supra clavicular adenopathy  Diagnostic Tests: CT CHEST WITHOUT CONTRAST  TECHNIQUE: Multidetector CT imaging of the chest was performed following the standard protocol without IV contrast.  COMPARISON:  04/22/2017 chest CT.  09/22/2017 chest radiograph.  FINDINGS: Cardiovascular: Normal heart size. No significant pericardial effusion/thickening. Left anterior descending coronary atherosclerosis. Great vessels are normal in course and caliber.  Mediastinum/Nodes: No discrete thyroid nodules. Unremarkable esophagus. No pathologically enlarged axillary, mediastinal or hilar lymph nodes, noting limited sensitivity for the detection of hilar adenopathy on this noncontrast study.  Lungs/Pleura: No pneumothorax. No pleural effusion. Moderate to severe centrilobular emphysema with mild  diffuse bronchial wall thickening. The irregular thick walled cavitary left upper lobe lung mass measures 8.2 x 4.6 cm (series 8/image 32), previously 8.2 x 3.6 cm, mildly increased in AP dimension with enlarging 2.3 x 2.1 cm solid nodular component at the posterior margin (series 8/image 37). There are numerous new and enlarging irregular solid pulmonary nodules throughout the lungs bilaterally. For example, a 1.4 cm irregular right upper lobe nodule (series 8/image 67), increased from 0.4 cm. Separate irregular 1.4 cm posterior right upper  lobe nodule along fissure (series 8/image 80), increased from 0.7 cm. New 1.7 cm irregular left upper lobe nodule (series 8/image 66). New patchy centrilobular nodularity throughout the mid lungs bilaterally.  Upper abdomen: Mildly enlarged upper retroperitoneal lymph nodes measuring up to 1.0 cm (series 2/image 164) appears stable.  Musculoskeletal: No aggressive appearing focal osseous lesions. Mild thoracic spondylosis.  IMPRESSION: 1. Mild interval growth of the irregular thick walled cavitary left upper lobe lung mass, with enlarging 2.3 cm solid nodular component at the posterior margin. Numerous new and enlarging irregular solid pulmonary nodules in both lungs. New patchy centrilobular nodularity throughout the mid lungs bilaterally. Findings could all represent interval progression of atypical mycobacterial infection (MAI), however malignancy cannot be excluded for any of these irregular solid pulmonary nodules in this patient with advanced smoking related changes in the lungs. Continued chest CT surveillance advised at a minimum. 2. No thoracic adenopathy. Stable nonspecific mild upper retroperitoneal adenopathy, presumably reactive given stability. 3. Moderate to severe centrilobular emphysema with mild diffuse bronchial wall thickening, suggesting COPD. 4. One vessel coronary atherosclerosis.  Emphysema (ICD10-J43.9).   Electronically Signed   By: Ilona Sorrel M.D.   On: 10/21/2017 14:47  I personally reviewed the CT images and concur with the findings noted above.  Impression: Calvin Schroeder is a 57 year old gentleman with a complex cavitary lung lesion in the left upper lobe.  He has known MAI infection and granulomas on biopsy.  He was diagnosed with HIV and is on therapy for that presently.  His CT today shows progression of this lesion and multiple other new lung nodules.  These nodules appear relatively solid, but seem more likely to be infectious rather  than malignant.  However, malignancy cannot be ruled out.  He sees Dr. [name] next week.  Hopefully, he can try antibiotic therapy for his MAI again to see if any of these smaller lesions respond to that.  I will plan to rescan him again in about 3 months to see if there is any further progression.  Plan: Follow-up with Dr. [name] as scheduled  Return in 3 months with CT chest  [name], MD [facility] [phone]

## 2017-10-21 NOTE — Assessment & Plan Note (Signed)
Continue Anoro Ellipta  >>> Take 1 puff daily in the morning right when you wake up >>>Rinse your mouth out after use >>>This is a daily maintenance inhaler, NOT a rescue inhaler >>>Contact our office if you are having difficulties affording or obtaining this medication >>>It is important for you to be able to take this daily and not miss any doses  Flu shot today   Please pick up rescue inhaler   Only use your albuterol as a rescue medication to be used if you can't catch your breath by resting or doing a relaxed purse lip breathing pattern.  - The less you use it, the better it will work when you need it. - Ok to use up to 2 puffs  every 4 hours if you must but call for immediate appointment if use goes up over your usual need - Don't leave home without it !!  (think of it like the spare tire for your car)   Continue follow-up with infectious disease Continue follow-up with surgery-[facility]ricks[facility] Complete CT of chest today  We recommend that you stop smoking.   Smoking Cessation Resources:  1 800 QUIT NOW  >>> Patient to call this resource and utilize it to help support her quit smoking >>> Keep up your hard work with stopping smoking  You can also contact the [facility] >>>For smoking cessation classes call [phone]  We do not recommend using e-cigarettes as a form of stopping smoking  Follow up in 6-8 weeks with Dr. Lake Bells

## 2017-10-21 NOTE — Assessment & Plan Note (Signed)
Continue follow-up with infectious disease Continue follow-up with surgery-Hendrickson Complete CT of chest today  Follow up in 6-8 weeks with Dr. Lake Bells

## 2017-10-21 NOTE — Assessment & Plan Note (Signed)
Flu shot today  We recommend that you stop smoking.   Smoking Cessation Resources:  1 800 QUIT NOW  >>> Patient to call this resource and utilize it to help support her quit smoking >>> Keep up your hard work with stopping smoking  You can also contact the [facility] >>>For smoking cessation classes call [phone]  We do not recommend using e-cigarettes as a form of stopping smoking  Follow up in 6-8 weeks with Dr. Lake Bells

## 2017-10-21 NOTE — Assessment & Plan Note (Signed)
Continue follow-up with infectious disease Continue Tivicay

## 2017-10-22 NOTE — Progress Notes (Signed)
Revoiewed, agree

## 2017-11-05 ENCOUNTER — Encounter: Payer: Self-pay | Admitting: Infectious Diseases

## 2017-11-05 ENCOUNTER — Ambulatory Visit (INDEPENDENT_AMBULATORY_CARE_PROVIDER_SITE_OTHER): Payer: Self-pay | Admitting: Infectious Diseases

## 2017-11-05 DIAGNOSIS — Z720 Tobacco use: Secondary | ICD-10-CM

## 2017-11-05 DIAGNOSIS — A310 Pulmonary mycobacterial infection: Secondary | ICD-10-CM

## 2017-11-05 DIAGNOSIS — B20 Human immunodeficiency virus [HIV] disease: Secondary | ICD-10-CM

## 2017-11-05 DIAGNOSIS — R918 Other nonspecific abnormal finding of lung field: Secondary | ICD-10-CM

## 2017-11-05 MED ORDER — ETHAMBUTOL HCL 400 MG PO TABS: 15.0000 mg/kg | ORAL_TABLET | Freq: Every day | ORAL | 1 refills | Status: AC

## 2017-11-05 MED ORDER — AZITHROMYCIN 600 MG PO TABS: 600.0000 mg | ORAL_TABLET | Freq: Every day | ORAL | 5 refills | Status: AC

## 2017-11-05 NOTE — Assessment & Plan Note (Signed)
He is improving Has gotten flu vax.  Can get shingles at any pharmacy Offered/refused condoms.  rtc in 6 weeks.

## 2017-11-05 NOTE — Progress Notes (Signed)
   Subjective:    Patient ID: Calvin Schroeder, male    DOB: 02/13/1960, 56 y.o.   MRN: [id number]  HPI 57 yo M previously found to have cystic lesion on chest CT on adm for PNA. 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 He was seen in ID was found to have AIDS.  He was started on E/A/R however developed n/v (that persisted despite zofran)  He was started on tivicay-truvada and his MAI rx has been on hold.  He has noted purplish bruise like lesions on his arms, they have not improved.  He has had f/u with pulm for COPD. He has also f/u with CVTS for a cyctic mass in LUL. His f/u CT was unchanged. He is planned for repeat CT in 3 months.  States he has been back to hospital for pna since last visit.  He has also been seen by Onc for KS since last visit. Feels like they are going away slowly. Has f/u visit in Jan.  Has had repeat episode of shingles as well.  No problems with ART. "scared to death" to restart MAI medications.   HIV 1 RNA Quant  Date Value  06/09/2017 109 copies/mL (H)  03/24/2017 1,020,000 Copies/mL (H)   CD4 T Cell Abs (/uL)  Date Value  06/09/2017 260 (L)  03/24/2017 80 (L)   Review of Systems  Constitutional: Negative for appetite change and unexpected weight change.  Respiratory: Negative for cough and shortness of breath.   Gastrointestinal: Negative for constipation and diarrhea.  Genitourinary: Negative for difficulty urinating.  Skin: Positive for rash.  wt up 20#     Objective:   Physical Exam  Constitutional: He is oriented to person, place, and time. He appears well-developed and well-nourished.  HENT:  Mouth/Throat: No oropharyngeal exudate.  Eyes: Pupils are equal, round, and reactive to light. EOM are normal.  Neck: Normal range of motion. Neck supple.  Cardiovascular: Normal rate, regular rhythm and normal heart sounds.  Pulmonary/Chest: Effort normal and breath sounds normal.  Abdominal: Soft. Bowel sounds are normal. He exhibits no distension. There is no  tenderness.  Musculoskeletal: He exhibits no edema.  Lymphadenopathy:    He has no cervical adenopathy.  Neurological: He is alert and oriented to person, place, and time.       Assessment & Plan:

## 2017-11-05 NOTE — Assessment & Plan Note (Signed)
Will resume azithro and ethambutol to see if he can tolerate just 2 drugs to start He does not want to take rifampin again.

## 2017-11-05 NOTE — Assessment & Plan Note (Signed)
Appreciate CVTS f/u Will get him back on MAI rx to see if this will help delineate his CTs better

## 2017-11-05 NOTE — Assessment & Plan Note (Signed)
Has cut back by half, 2ppd to 1ppd

## 2017-11-06 LAB — T-HELPER CELL (CD4) - (RCID CLINIC ONLY)
CD4 T CELL HELPER: 16 % — AB (ref 33–55)
CD4 T Cell Abs: 240 /uL — ABNORMAL LOW (ref 400–2700)

## 2017-11-08 LAB — CBC
HCT: 46.8 % (ref 38.5–50.0)
HEMOGLOBIN: 16.3 g/dL (ref 13.2–17.1)
MCH: 34.1 pg — AB (ref 27.0–33.0)
MCHC: 34.8 g/dL (ref 32.0–36.0)
MCV: 97.9 fL (ref 80.0–100.0)
MPV: 9.1 fL (ref 7.5–12.5)
PLATELETS: 291 10*3/uL (ref 140–400)
RBC: 4.78 10*6/uL (ref 4.20–5.80)
RDW: 13.2 % (ref 11.0–15.0)
WBC: 7.4 10*3/uL (ref 3.8–10.8)

## 2017-11-08 LAB — COMPREHENSIVE METABOLIC PANEL
AG RATIO: 1 (calc) (ref 1.0–2.5)
ALT: 15 U/L (ref 9–46)
AST: 20 U/L (ref 10–35)
Albumin: 4.2 g/dL (ref 3.6–5.1)
Alkaline phosphatase (APISO): 103 U/L (ref 40–115)
BUN: 11 mg/dL (ref 7–25)
CO2: 28 mmol/L (ref 20–32)
Calcium: 9.4 mg/dL (ref 8.6–10.3)
Chloride: 102 mmol/L (ref 98–110)
Creat: 0.88 mg/dL (ref 0.70–1.33)
GLUCOSE: 95 mg/dL (ref 65–99)
Globulin: 4.1 g/dL (calc) — ABNORMAL HIGH (ref 1.9–3.7)
Potassium: 5.2 mmol/L (ref 3.5–5.3)
Sodium: 137 mmol/L (ref 135–146)
Total Bilirubin: 0.4 mg/dL (ref 0.2–1.2)
Total Protein: 8.3 g/dL — ABNORMAL HIGH (ref 6.1–8.1)

## 2017-11-08 LAB — HIV-1 RNA QUANT-NO REFLEX-BLD
HIV 1 RNA Quant: <20 copies/mL
HIV-1 RNA Quant, Log: <1.3 Log copies/mL

## 2017-12-03 ENCOUNTER — Ambulatory Visit: Payer: Self-pay | Admitting: Pulmonary Disease

## 2017-12-11 ENCOUNTER — Ambulatory Visit (INDEPENDENT_AMBULATORY_CARE_PROVIDER_SITE_OTHER): Payer: Medicaid Other | Admitting: Pulmonary Disease

## 2017-12-11 ENCOUNTER — Encounter: Payer: Self-pay | Admitting: Pulmonary Disease

## 2017-12-11 VITALS — BP 132/80 | HR 74 | Ht 70.0 in | Wt 137.6 lb

## 2017-12-11 DIAGNOSIS — A310 Pulmonary mycobacterial infection: Secondary | ICD-10-CM | POA: Diagnosis not present

## 2017-12-11 DIAGNOSIS — R197 Diarrhea, unspecified: Secondary | ICD-10-CM | POA: Diagnosis not present

## 2017-12-11 DIAGNOSIS — Z21 Asymptomatic human immunodeficiency virus [HIV] infection status: Secondary | ICD-10-CM

## 2017-12-11 DIAGNOSIS — R918 Other nonspecific abnormal finding of lung field: Secondary | ICD-10-CM | POA: Diagnosis not present

## 2017-12-11 DIAGNOSIS — J439 Emphysema, unspecified: Secondary | ICD-10-CM | POA: Diagnosis not present

## 2017-12-11 DIAGNOSIS — Z720 Tobacco use: Secondary | ICD-10-CM

## 2017-12-11 DIAGNOSIS — B20 Human immunodeficiency virus [HIV] disease: Secondary | ICD-10-CM | POA: Diagnosis not present

## 2017-12-11 MED ORDER — UMECLIDINIUM-VILANTEROL 62.5-25 MCG/INH IN AEPB: 1.0000 | INHALATION_SPRAY | Freq: Every day | RESPIRATORY_TRACT | 5 refills | Status: DC

## 2017-12-11 MED ORDER — UMECLIDINIUM-VILANTEROL 62.5-25 MCG/INH IN AEPB: 1.0000 | INHALATION_SPRAY | Freq: Every day | RESPIRATORY_TRACT | 0 refills | Status: DC

## 2017-12-11 NOTE — Progress Notes (Signed)
Subjective:   PATIENT ID: Calvin Schroeder GENDER: male DOB: October 09, 1960, MRN: [id number]  Synopsis: Referred in March 2019 for a lung mass and emphysema.  He has a history of COPD and MAI.  His lung mass was biopsied on 02/07/2017 by Dr. 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 Diagnosed with HIV in 2019.  As of 03/2017 he was not tolerating MAI treatment due to GI side effects.  HPI  Chief Complaint  Patient presents with  . Follow-up    states hes doing about the same. Wants [place] to help him find PCP    Calvin Schroeder has been struggling with diarrhea for the last few weeks.  However, he has been compliant with his medications.  He is here today to follow-up regarding his left upper lung mass, pulmonary nodules, COPD and ongoing tobacco use.  He says that his breathing has been fine.  He is gaining weight.  Past Medical History:  Diagnosis Date  . Back pain   . COPD (chronic obstructive pulmonary disease) (HCC)    emphysema   . Dyspnea    with exertion   . HIV (human immunodeficiency virus infection) ([place])   . 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 Pneumonia    11/2016  . Tobacco abuse 06/10/2013     Review of Systems  Constitutional: Negative for fever and weight loss.  HENT: Negative for congestion, ear pain, nosebleeds and sore throat.   Eyes: Negative for redness.  Respiratory: Negative for cough, shortness of breath and wheezing.   Cardiovascular: Negative for palpitations, leg swelling and PND.  Gastrointestinal: Negative for nausea and vomiting.  Genitourinary: Negative for dysuria.  Skin: Negative for rash.  Neurological: Negative for headaches.  Endo/Heme/Allergies: Does not bruise/bleed easily.  Psychiatric/Behavioral: Negative for depression. The patient is not nervous/anxious.       Objective:  Physical Exam   Vitals:   12/11/17 0930  BP: 132/80  Pulse: 74  SpO2: 99%  Weight: 137 lb 9.6 oz (62.4  kg)  Height: _0  (1.778 m)   RA  Gen: chronically ill appearing HENT: OP clear, TM's clear, neck supple PULM: Poor air movement B, normal percussion CV: RRR, no mgr, trace edema GI: BS+, soft, nontender Derm: no cyanosis or rash Psyche: normal mood and affect    CBC    Component Value Date/Time   WBC 7.4 11/05/2017 0938   RBC 4.78 11/05/2017 0938   HGB 16.3 11/05/2017 0938   HGB 14.3 12/10/2016 1042   HCT 46.8 11/05/2017 0938   HCT 42.1 12/10/2016 1042   PLT 291 11/05/2017 0938   PLT 121 (L) 12/10/2016 1042   MCV 97.9 11/05/2017 0938   MCV 98.9 (H) 12/10/2016 1042   MCH 34.1 (H) 11/05/2017 0938   MCHC 34.8 11/05/2017 0938   RDW 13.2 11/05/2017 0938   RDW 12.6 12/10/2016 1042   LYMPHSABS 0.7 (L) 12/10/2016 1042   MONOABS 0.4 12/10/2016 1042   EOSABS 0.1 12/10/2016 1042   BASOSABS 0.0 12/10/2016 1042     Chest imaging: 12/2016 Chest CT iamges reviewed showing a cavitary mass in the RUL, significant emphysema bilaterally April 2019 CT chest showed emphysema, stability in the right upper lobe mass, a new right middle lobe nodule noted, he was noted to have "advanced coronary calcification" October 2019 CT chest images independently reviewed showing a stable left upper lobe cavitary mass, some nodules in the upper lobes bilaterally are new.  PFT:  June 2019 ratio 65%, FEV1 2.34 L 61% predicted, FVC 3.63 L 73% predicted  Labs:  Path: 02/2017 LUL mass path: granulomas, special stains negative  Micro: 02/2017 LUL BAL: MAC  Echo:  Heart Catheterization:  Records from his visit with thoracic surgery reviewed where he was followed for the mass in his lung, no growth noted.  They plan 57-monthfollow-up Records from his visit with our nurse practitioner in October reviewed, he had been started on Anoro in the visit prior and was continued on that as he was stable.     Assessment & Plan:   Tobacco abuse  MAI (mycobacterium avium-intracellulare) (HCC)  HIV  infection, unspecified symptom status ([place]  Pulmonary emphysema, unspecified emphysema type ([place]  Lung mass  Diarrhea, unspecified type  Discussion: From a COPD standpoint this has been a stable interval for Calvin Schroeder  He has cut back smoking to 1 pack of cigarettes daily.  He plans to quit smoking on his birthday, he was encouraged to do this today.  I offered various forms of nicotine replacement or Chantix but he refused he says he is going to try to quit [place]  In regards to the increasing size of the mass and new pulmonary nodules, I think this is all due to previously untreated MAI though he is at exceedingly high risk for having lung cancer.  My bias would be to treat him for 6 months with MAI therapy prior to considering a biopsy.  Plan: COPD: I am glad that your flu shot is up-to-date Practice good hand hygiene Stay active: Reasonable activity goals are walking 20 minutes a day at a fast pace and doing 2 days a week of weight lifting Continue Anoro 1 puff daily no matter how you feel Use albuterol as needed for chest tightness wheezing or shortness of breath  Cigarette use: Do your best to quit smoking, I strongly encourage you to quit on your birthday as you have planned  Increasing left upper lobe mass with pulmonary nodules: As stated today, I think this is likely MAI Continue treatment for MAI If these nodules are increasing in size on MAI treatment after 6 months of treatment then they need to be biopsied  Diarrhea due to MAI treatment: new problem Try the brat diet: Bananas, rice, applesauce, toast If that does not help then use Imodium over-the-counter  We will see you back in 3 to 4 months or sooner if needed    Current Outpatient Medications:  .  azithromycin (ZITHROMAX) 600 MG tablet, Take 1 tablet (600 mg total) by mouth daily., Disp: 10 tablet, Rfl: 5 .  dolutegravir (TIVICAY) 50 MG tablet, Take 1 tablet (50 mg total) by mouth daily., Disp: 30  tablet, Rfl: 5 .  emtricitabine-tenofovir AF (DESCOVY) 200-25 MG tablet, Take 1 tablet by mouth daily., Disp: 90 tablet, Rfl: 5 .  ethambutol (MYAMBUTOL) 400 MG tablet, Take 2.5 tablets (1,000 mg total) by mouth daily., Disp: 225 tablet, Rfl: 1 .  umeclidinium-vilanterol (ANORO ELLIPTA) 62.5-25 MCG/INH AEPB, Inhale 1 puff into the lungs daily., Disp: 1 each, Rfl: 0

## 2017-12-11 NOTE — Addendum Note (Signed)
Addended by: Dolores Lory on: 12/11/2017 10:06 AM   Modules accepted: Orders

## 2017-12-11 NOTE — Patient Instructions (Signed)
COPD: I am glad that your flu shot is up-to-date Practice good hand hygiene Stay active: Reasonable activity goals are walking 20 minutes a day at a fast pace and doing 2 days a week of weight lifting Continue Anoro 1 puff daily no matter how you feel Use albuterol as needed for chest tightness wheezing or shortness of breath  Cigarette use: Do your best to quit smoking, I strongly encourage you to quit on your birthday as you have planned  Increasing left upper lobe mass with pulmonary nodules: As stated today, I think this is likely MAI Continue treatment for MAI If these nodules are increasing in size on MAI treatment after 6 months of treatment then they need to be biopsied  Diarrhea due to MAI treatment: Try the brat diet: Bananas, rice, applesauce, toast If that does not help then use Imodium over-the-counter  We will see you back in 3 to 4 months or sooner if needed

## 2017-12-12 ENCOUNTER — Other Ambulatory Visit: Payer: Self-pay | Admitting: *Deleted

## 2017-12-12 DIAGNOSIS — R911 Solitary pulmonary nodule: Secondary | ICD-10-CM

## 2017-12-26 ENCOUNTER — Encounter: Payer: Self-pay | Admitting: Infectious Diseases

## 2017-12-26 ENCOUNTER — Ambulatory Visit (INDEPENDENT_AMBULATORY_CARE_PROVIDER_SITE_OTHER): Payer: Self-pay | Admitting: Infectious Diseases

## 2017-12-26 VITALS — BP 150/90 | HR 84 | Temp 98.8°F | Wt 146.0 lb

## 2017-12-26 DIAGNOSIS — A310 Pulmonary mycobacterial infection: Secondary | ICD-10-CM

## 2017-12-26 DIAGNOSIS — B20 Human immunodeficiency virus [HIV] disease: Secondary | ICD-10-CM

## 2017-12-26 DIAGNOSIS — R918 Other nonspecific abnormal finding of lung field: Secondary | ICD-10-CM

## 2017-12-26 DIAGNOSIS — Z79899 Other long term (current) drug therapy: Secondary | ICD-10-CM

## 2017-12-26 DIAGNOSIS — Z113 Encounter for screening for infections with a predominantly sexual mode of transmission: Secondary | ICD-10-CM

## 2017-12-26 MED ORDER — DIPHENOXYLATE-ATROPINE 2.5-0.025 MG PO TABS: 1.0000 | ORAL_TABLET | Freq: Four times a day (QID) | ORAL | 0 refills | Status: DC | PRN

## 2017-12-26 NOTE — Assessment & Plan Note (Signed)
Will write for lomotil to take to see if this improves.  Could change to TIW dosing if needed.

## 2017-12-26 NOTE — Assessment & Plan Note (Signed)
Scheduled for repeat CT Jan 2020.

## 2017-12-26 NOTE — Assessment & Plan Note (Signed)
He is doing much better.  Has gotten flu shot.  Offered/refused condoms.  rtc in 3 months.

## 2017-12-26 NOTE — Progress Notes (Signed)
   Subjective:    Patient ID: Calvin Schroeder, male    DOB: 12-May-1960, 57 y.o.   MRN: [id number]  HPI 57 yo M previously found to have cystic lesion on chest CT on adm for PNA. 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 He was seen in [place] found to have AIDS.  He was started on E/A/R however developed n/v (that persisted despite zofran) He was started on tivicay-truvada. He has had f/u with pulm for COPD. He has also f/u with CVTS for a cyctic mass in LUL. His f/u CT was unchanged.He is planned for repeat CT in 3 months.  He has also been seen by Onc for KS since last visit. These have improved. Has f/u visit in Jan.   Restarted MAI rx 11-05-17.  He has been missing these due to severe diarrhea. Feels like he could try TIW.  He is to have repeat CT of chest Jan 2020. He would like to defer this.    HIV 1 RNA Quant  Date Value  11/05/2017 <20 NOT DETECTED copies/mL  06/09/2017 109 copies/mL (H)  03/24/2017 1,020,000 Copies/mL (H)   CD4 T Cell Abs (/uL)  Date Value  11/05/2017 240 (L)  06/09/2017 260 (L)  03/24/2017 80 (L)   Has gained wt. 117--> 140#.   Review of Systems  Constitutional: Negative for appetite change and unexpected weight change.  Respiratory: Negative for cough and shortness of breath.   Cardiovascular: Negative for chest pain.  Gastrointestinal: Negative for constipation and diarrhea.  Genitourinary: Negative for difficulty urinating.  Psychiatric/Behavioral: Negative for sleep disturbance.       Objective:   Physical Exam Constitutional:      General: He is not in acute distress.    Appearance: He is not ill-appearing.  HENT:     Mouth/Throat:     Mouth: Mucous membranes are moist.     Pharynx: Oropharyngeal exudate present.  Eyes:     Extraocular Movements: Extraocular movements intact.     Pupils: Pupils are equal, round, and reactive to light.  Cardiovascular:     Rate and Rhythm: Normal rate and regular rhythm.  Pulmonary:     Effort: Pulmonary effort  is normal.     Breath sounds: Normal breath sounds.  Abdominal:     General: Abdomen is flat. Bowel sounds are normal. There is no distension.     Palpations: Abdomen is soft.  Musculoskeletal:        General: No swelling.  Skin:               Assessment & Plan:

## 2018-01-13 ENCOUNTER — Other Ambulatory Visit: Payer: Medicaid Other

## 2018-01-20 ENCOUNTER — Encounter: Payer: Medicaid Other | Admitting: Thoracic Surgery (Cardiothoracic Vascular Surgery)

## 2018-02-03 ENCOUNTER — Telehealth: Payer: Self-pay | Admitting: *Deleted

## 2018-02-03 DIAGNOSIS — A310 Pulmonary mycobacterial infection: Secondary | ICD-10-CM

## 2018-02-03 DIAGNOSIS — B20 Human immunodeficiency virus [HIV] disease: Secondary | ICD-10-CM

## 2018-02-03 NOTE — Telephone Encounter (Signed)
Patient would like refill of lomotil, stating it is working well and allows him to take his MAC treatment daily (re: MAC regimen, he is "trying to take it as much as possible" but misses some daily doses).  He does not want to back down to three times a week yet.   RN called in the refill to [place] at [facility].  Landis Gandy, RN

## 2018-02-04 MED ORDER — DIPHENOXYLATE-ATROPINE 2.5-0.025 MG PO TABS: 1.0000 | ORAL_TABLET | Freq: Four times a day (QID) | ORAL | 0 refills | Status: DC | PRN

## 2018-02-05 ENCOUNTER — Inpatient Hospital Stay: Payer: Medicaid Other | Attending: Oncology | Admitting: Oncology

## 2018-02-05 ENCOUNTER — Emergency Department (HOSPITAL_COMMUNITY): Payer: Medicaid Other

## 2018-02-05 ENCOUNTER — Emergency Department (HOSPITAL_COMMUNITY)
Admission: EM | Admit: 2018-02-05 | Discharge: 2018-02-05 | Disposition: A | Discharge status: Home / Self Care | Payer: Medicaid Other | Attending: Emergency Medicine | Admitting: Emergency Medicine

## 2018-02-05 ENCOUNTER — Encounter (HOSPITAL_COMMUNITY): Payer: Self-pay

## 2018-02-05 DIAGNOSIS — R05 Cough: Secondary | ICD-10-CM | POA: Diagnosis not present

## 2018-02-05 DIAGNOSIS — J449 Chronic obstructive pulmonary disease, unspecified: Secondary | ICD-10-CM | POA: Diagnosis not present

## 2018-02-05 DIAGNOSIS — R0602 Shortness of breath: Secondary | ICD-10-CM | POA: Diagnosis present

## 2018-02-05 DIAGNOSIS — J189 Pneumonia, unspecified organism: Secondary | ICD-10-CM | POA: Diagnosis not present

## 2018-02-05 DIAGNOSIS — F17210 Nicotine dependence, cigarettes, uncomplicated: Secondary | ICD-10-CM | POA: Diagnosis not present

## 2018-02-05 DIAGNOSIS — Z79899 Other long term (current) drug therapy: Secondary | ICD-10-CM | POA: Diagnosis not present

## 2018-02-05 LAB — CBC WITH DIFFERENTIAL/PLATELET
Abs Immature Granulocytes: 0.01 10*3/uL (ref 0.00–0.07)
Basophils Absolute: 0 10*3/uL (ref 0.0–0.1)
Basophils Relative: 1 %
Eosinophils Absolute: 0 10*3/uL (ref 0.0–0.5)
Eosinophils Relative: 0 %
HCT: 50 % (ref 39.0–52.0)
Hemoglobin: 16.9 g/dL (ref 13.0–17.0)
Immature Granulocytes: 0 %
Lymphocytes Relative: 19 %
Lymphs Abs: 0.9 10*3/uL (ref 0.7–4.0)
MCH: 34.1 pg — ABNORMAL HIGH (ref 26.0–34.0)
MCHC: 33.8 g/dL (ref 30.0–36.0)
MCV: 100.8 fL — AB (ref 80.0–100.0)
MONOS PCT: 14 %
Monocytes Absolute: 0.7 10*3/uL (ref 0.1–1.0)
Neutro Abs: 3.2 10*3/uL (ref 1.7–7.7)
Neutrophils Relative %: 66 %
Platelets: 188 10*3/uL (ref 150–400)
RBC: 4.96 MIL/uL (ref 4.22–5.81)
RDW: 12.5 % (ref 11.5–15.5)
WBC: 4.8 10*3/uL (ref 4.0–10.5)
nRBC: 0 % (ref 0.0–0.2)

## 2018-02-05 LAB — COMPREHENSIVE METABOLIC PANEL
ALBUMIN: 4.2 g/dL (ref 3.5–5.0)
ALK PHOS: 97 U/L (ref 38–126)
ALT: 29 U/L (ref 0–44)
AST: 33 U/L (ref 15–41)
Anion gap: 12 (ref 5–15)
BUN: 9 mg/dL (ref 6–20)
CO2: 27 mmol/L (ref 22–32)
Calcium: 9.6 mg/dL (ref 8.9–10.3)
Chloride: 96 mmol/L — ABNORMAL LOW (ref 98–111)
Creatinine, Ser: 0.92 mg/dL (ref 0.61–1.24)
GFR calc Af Amer: >60 mL/min (ref >60)
GFR calc non Af Amer: >60 mL/min (ref >60)
GLUCOSE: 124 mg/dL — AB (ref 70–99)
Potassium: 4.3 mmol/L (ref 3.5–5.1)
Sodium: 135 mmol/L (ref 135–145)
Total Bilirubin: 1 mg/dL (ref 0.3–1.2)
Total Protein: 8.8 g/dL — ABNORMAL HIGH (ref 6.5–8.1)

## 2018-02-05 LAB — LACTIC ACID, PLASMA: Lactic Acid, Venous: 1 mmol/L (ref 0.5–1.9)

## 2018-02-05 MED ORDER — ONDANSETRON HCL 4 MG/2ML IJ SOLN
4.0000 mg | Freq: Once | INTRAMUSCULAR | Status: AC
  Administered 2018-02-05: 4 mg via INTRAVENOUS
  Filled 2018-02-05: qty 2

## 2018-02-05 MED ORDER — OSELTAMIVIR PHOSPHATE 75 MG PO CAPS: 75.0000 mg | ORAL_CAPSULE | Freq: Two times a day (BID) | ORAL | 0 refills | Status: DC

## 2018-02-05 MED ORDER — SODIUM CHLORIDE 0.9 % IV BOLUS
1000.0000 mL | Freq: Once | INTRAVENOUS | Status: AC
  Administered 2018-02-05: 1000 mL via INTRAVENOUS

## 2018-02-05 MED ORDER — PREDNISONE 50 MG PO TABS: 50.0000 mg | ORAL_TABLET | Freq: Every day | ORAL | 0 refills | Status: DC

## 2018-02-05 MED ORDER — OSELTAMIVIR PHOSPHATE 75 MG PO CAPS
75.0000 mg | ORAL_CAPSULE | Freq: Once | ORAL | Status: AC
  Administered 2018-02-05: 75 mg via ORAL
  Filled 2018-02-05: qty 1

## 2018-02-05 MED ORDER — IPRATROPIUM-ALBUTEROL 0.5-2.5 (3) MG/3ML IN SOLN
3.0000 mL | Freq: Once | RESPIRATORY_TRACT | Status: AC
  Administered 2018-02-05: 3 mL via RESPIRATORY_TRACT
  Filled 2018-02-05: qty 3

## 2018-02-05 MED ORDER — DOXYCYCLINE HYCLATE 100 MG PO TABS: 100.0000 mg | ORAL_TABLET | Freq: Two times a day (BID) | ORAL | 0 refills | Status: AC

## 2018-02-05 MED ORDER — PREDNISONE 20 MG PO TABS
60.0000 mg | ORAL_TABLET | Freq: Once | ORAL | Status: AC
  Administered 2018-02-05: 60 mg via ORAL
  Filled 2018-02-05: qty 3

## 2018-02-05 NOTE — ED Triage Notes (Signed)
Pt arrives POV for eval of SHOB and flu-like sx. Pt reports "it's either pneumonia or the flu." onset of sx yesterday, denies fever at home. Reports 2-3 episodes of emesis PTA.

## 2018-02-05 NOTE — ED Provider Notes (Signed)
[facility] EMERGENCY DEPARTMENT Provider Note   CSN: [id number] Arrival date & time: 02/05/18  1647     History   Chief Complaint Chief Complaint  Patient presents with  . Shortness of Breath    HPI Calvin Schroeder is a 58 y.o. male.  HPI Patient presents emergency room for evaluation of fevers and cough and shortness of breath.  Patient feels like he either has the influenza or pneumonia.  Patient states he has been coughing for the last 36 hours.  He has had few episodes of nausea vomiting.  He has had some body aches.  He denies any fevers.  Patient has felt like he has been wheezing.  He does have history of COPD and does smoke.  Patient also has a history of AIDS/HIV and is being treated for MAC. Past Medical History:  Diagnosis Date  . Back pain   . COPD (chronic obstructive pulmonary disease) (HCC)    emphysema   . Dyspnea    with exertion   . HIV (human immunodeficiency virus infection) ([place])   . 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 Pneumonia    11/2016  . Tobacco abuse 06/10/2013    Patient Active Problem List   Diagnosis Date Noted  . HIV (human immunodeficiency virus infection) ([place]) 09/22/2017  . 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 AIDS (acquired immune deficiency syndrome) ([place]) 04/09/2017  . 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 MAI (mycobacterium avium-intracellulare) ([place]) 03/17/2017  . Erythrocytosis 06/10/2013  . Lung mass 06/10/2013  . Tobacco abuse 06/10/2013    Past Surgical History:  Procedure Laterality Date  . APPENDECTOMY    . SHOULDER SURGERY    . VIDEO BRONCHOSCOPY WITH ENDOBRONCHIAL NAVIGATION N/A 02/07/2017   Procedure: VIDEO BRONCHOSCOPY WITH ENDOBRONCHIAL NAVIGATION;  Surgeon: [name], MD;  Location: [facility];  Service: Thoracic;  Laterality: N/A;  . VIDEO BRONCHOSCOPY WITH ENDOBRONCHIAL ULTRASOUND N/A 02/07/2017   Procedure: VIDEO BRONCHOSCOPY WITH ENDOBRONCHIAL ULTRASOUND;  Surgeon:  [name], MD;  Location: [facility] OR;  Service: Thoracic;  Laterality: N/A;        Home Medications    Prior to Admission medications   Medication Sig Start Date End Date Taking? Authorizing Provider  azithromycin (ZITHROMAX) 600 MG tablet Take 1 tablet by mouth daily. 01/11/18  Yes [provider]  diphenoxylate-atropine (LOMOTIL) 2.5-0.025 MG tablet Take 1 tablet by mouth 4 (four) times daily as needed for diarrhea or loose stools. 02/04/18  Yes Campbell Riches, MD  dolutegravir (TIVICAY) 50 MG tablet Take 1 tablet (50 mg total) by mouth daily. 08/11/17  Yes Campbell Riches, MD  emtricitabine-tenofovir AF (DESCOVY) 200-25 MG tablet Take 1 tablet by mouth daily. 08/11/17  Yes Campbell Riches, MD  ethambutol (MYAMBUTOL) 400 MG tablet Take 2.5 tablets (1,000 mg total) by mouth daily. 11/05/17 05/04/18 Yes Campbell Riches, MD  umeclidinium-vilanterol (ANORO ELLIPTA) 62.5-25 MCG/INH AEPB Inhale 1 puff into the lungs daily. Patient taking differently: Inhale 1 puff into the lungs daily as needed (SOB).  12/11/17  Yes Juanito Doom, MD  doxycycline (VIBRA-TABS) 100 MG tablet Take 1 tablet (100 mg total) by mouth 2 (two) times daily for 7 days. 02/05/18 02/12/18  Dorie Rank, MD  oseltamivir (TAMIFLU) 75 MG capsule Take 1 capsule (75 mg total) by mouth 2 (two) times daily. 02/05/18   Dorie Rank, MD  predniSONE (DELTASONE) 50 MG tablet Take 1 tablet (50 mg total)  by mouth daily. 02/05/18   Dorie Rank, MD  umeclidinium-vilanterol (ANORO ELLIPTA) 62.5-25 MCG/INH AEPB Inhale 1 puff into the lungs daily. Patient not taking: Reported on 02/05/2018 10/21/17   Lauraine Rinne, NP  umeclidinium-vilanterol (ANORO ELLIPTA) 62.5-25 MCG/INH AEPB Inhale 1 puff into the lungs daily. Patient not taking: Reported on 02/05/2018 12/11/17   Juanito Doom, MD    Family History Family History  Problem Relation Age of Onset  . Cancer Mother        lung ca  . Cancer Father        skin ca  .  Cancer Maternal Uncle        liver ca  . Cancer Maternal Grandmother        bladder ca    Social History Social History   Tobacco Use  . Smoking status: Current Every Day Smoker    Packs/day: 1.00    Years: 40.00    Pack years: 40.00  . Smokeless tobacco: Never Used  . Tobacco comment: 1 pack per day currently, quit date set for 12/23/17 per pt   Substance Use Topics  . Alcohol use: Yes    Comment: daily - 3-4 alcohol  . Drug use: Yes    Types: Marijuana    Comment: daily      Allergies   Vicodin [hydrocodone-acetaminophen]   Review of Systems Review of Systems  All other systems reviewed and are negative.    Physical Exam Updated Vital Signs BP (!) 134/92   Pulse 88   Temp 98.4 F (36.9 C)   Resp 18   SpO2 93%   Physical Exam Vitals signs and nursing note reviewed.  Constitutional:      General: He is not in acute distress.    Appearance: He is well-developed.  HENT:     Head: Normocephalic and atraumatic.     Right Ear: External ear normal.     Left Ear: External ear normal.  Eyes:     General: No scleral icterus.       Right eye: No discharge.        Left eye: No discharge.     Conjunctiva/sclera: Conjunctivae normal.  Neck:     Musculoskeletal: Neck supple.     Trachea: No tracheal deviation.  Cardiovascular:     Rate and Rhythm: Normal rate and regular rhythm.  Pulmonary:     Effort: Pulmonary effort is normal. No respiratory distress.     Breath sounds: No stridor. Wheezing present. No rales.  Abdominal:     General: Bowel sounds are normal. There is no distension.     Palpations: Abdomen is soft.     Tenderness: There is no abdominal tenderness. There is no guarding or rebound.  Musculoskeletal:        General: No tenderness.  Skin:    General: Skin is warm and dry.     Findings: No rash.  Neurological:     Mental Status: He is alert.     Cranial Nerves: No cranial nerve deficit (no facial droop, extraocular movements intact, no  slurred speech).     Sensory: No sensory deficit.     Motor: No abnormal muscle tone or seizure activity.     Coordination: Coordination normal.      ED Treatments / Results  Labs (all labs ordered are listed, but only abnormal results are displayed) Labs Reviewed  COMPREHENSIVE METABOLIC PANEL - Abnormal; Notable for the following components:      Result Value  Chloride 96 (*)    Glucose, Bld 124 (*)    Total Protein 8.8 (*)    All other components within normal limits  CBC WITH DIFFERENTIAL/PLATELET - Abnormal; Notable for the following components:   MCV 100.8 (*)    MCH 34.1 (*)    All other components within normal limits  LACTIC ACID, PLASMA      Radiology Dg Chest 2 View  Result Date: 02/05/2018 CLINICAL DATA:  Dyspnea with productive cough. History of lung mass, pneumonia, HIV and COPD. EXAM: CHEST - 2 VIEW COMPARISON:  CT and CXR exams dating back through 04/22/2017 FINDINGS: Emphysematous hyperinflation of the lungs consistent with COPD. A cavitary masslike abnormality in the left upper lobe is redemonstrated the more focal cavitation measuring approximately 3.5 x 1.7 x 2.1 cm, slightly smaller in appearance with slight thickening along the posterior aspect. Slightly more focal small pulmonary consolidation the right upper lobe is noted since prior recent comparison chest radiograph. A small focus of pneumonia is not entirely excluded. Follow up clearance is suggested. Heart and mediastinal contours within normal limits. No aggressive osseous lesions. IMPRESSION: 1. Persistent cavitary masslike abnormality in the left upper lobe, the most focal cavitation measuring approximately 3.5 x 1.7 x 2.1 cm and slightly smaller in appearance with slight thickening along the posterior aspect. 2. A small focus of airspace disease in the right upper lobe is noted since prior chest radiograph. Followup PA and lateral chest X-ray is recommended in 3-4 weeks following trial of antibiotic  therapy to ensure resolution and exclude underlying malignancy. Electronically Signed   By: Ashley Royalty M.D.   On: 02/05/2018 18:45    Procedures Procedures (including critical care time)  Medications Ordered in ED Medications  sodium chloride 0.9 % bolus 1,000 mL (0 mLs Intravenous Stopped 02/05/18 2144)  ondansetron (ZOFRAN) injection 4 mg (4 mg Intravenous Given 02/05/18 2043)  predniSONE (DELTASONE) tablet 60 mg (60 mg Oral Given 02/05/18 2036)  ipratropium-albuterol (DUONEB) 0.5-2.5 (3) MG/3ML nebulizer solution 3 mL (3 mLs Nebulization Given 02/05/18 2036)  oseltamivir (TAMIFLU) capsule 75 mg (75 mg Oral Given 02/05/18 2036)     Initial Impression / Assessment and Plan / ED Course  I have reviewed the triage vital signs and the nursing notes.  Pertinent labs & imaging results that were available during my care of the patient were reviewed by me and considered in my medical decision making (see chart for details).   Patient presented to the emergency room with complaints of cough and congestion.  In the emergency room he was noted to have some mild wheezing.  Patient does have history of HIV and is on medication regimen.  Patient is nontoxic and well-appearing.  Chest x-ray does show the possibility of pneumonia in addition to his known area of MAC.  Patient does have influenza-like symptoms as well.  His symptoms have been in the last 36 hours.  I will empirically treat him with Tamiflu as well as doxycycline.  Considering her wheezing at home take a short course of steroids in addition to his breathing treatments.  Discussed close outpatient follow-up.  Final Clinical Impressions(s) / ED Diagnoses   Final diagnoses:  Community acquired pneumonia, unspecified laterality    ED Discharge Orders         Ordered    doxycycline (VIBRA-TABS) 100 MG tablet  2 times daily     02/05/18 2252    oseltamivir (TAMIFLU) 75 MG capsule  2 times daily     02/05/18  2252    predniSONE (DELTASONE) 50  MG tablet  Daily     02/05/18 2252           Dorie Rank, MD 02/05/18 2259

## 2018-02-05 NOTE — Discharge Instructions (Addendum)
Take the antibiotics as prescribed, follow-up with your doctor to make sure the infection resolves, return to the emergency room for fever or shortness of breath

## 2018-02-09 ENCOUNTER — Ambulatory Visit
Admission: RE | Admit: 2018-02-09 | Discharge: 2018-02-09 | Disposition: A | Discharge status: Home / Self Care | Payer: Medicaid Other | Source: Ambulatory Visit | Attending: Thoracic Surgery (Cardiothoracic Vascular Surgery) | Admitting: Thoracic Surgery (Cardiothoracic Vascular Surgery)

## 2018-02-09 DIAGNOSIS — R918 Other nonspecific abnormal finding of lung field: Secondary | ICD-10-CM | POA: Diagnosis not present

## 2018-02-09 DIAGNOSIS — R911 Solitary pulmonary nodule: Secondary | ICD-10-CM

## 2018-02-10 ENCOUNTER — Ambulatory Visit: Payer: Medicaid Other | Admitting: Thoracic Surgery (Cardiothoracic Vascular Surgery)

## 2018-02-10 ENCOUNTER — Other Ambulatory Visit: Payer: Self-pay

## 2018-02-10 ENCOUNTER — Encounter: Payer: Self-pay | Admitting: Thoracic Surgery (Cardiothoracic Vascular Surgery)

## 2018-02-10 VITALS — BP 127/84 | HR 66 | Resp 18 | Ht 70.0 in | Wt 137.8 lb

## 2018-02-10 DIAGNOSIS — J984 Other disorders of lung: Secondary | ICD-10-CM

## 2018-02-10 NOTE — Telephone Encounter (Signed)
Thanks Michelle

## 2018-02-10 NOTE — Progress Notes (Signed)
[address]       [place] [address]             [phone]      HPI: Mr. Calvin Schroeder returns for a scheduled follow-up visit  Calvin Schroeder is a 58 year old man with history of heavy tobacco abuse, COPD, pulmonary MAI, HIV, Kaposi's sarcoma, and chronic back pain.  This that presented in the fall 2018.  He had a CT which showed a complex consolidative process in the left upper lobe.  Concern was for cancer.  Navigational bronchoscopy showed granulomas.  Cultures grew out MAI.  He was started on triple antibiotic therapy but was unable to tolerate that initially.  He has been followed with serial CTs for this cavitary mass and multiple other lung nodules.  I last saw him in October.  He had multiple new lung nodules.  These were felt likely to be due to infection rather than malignancy.  He now returns with a follow-up scan.  He saw Calvin Schroeder shortly after he saw me in October.  He was started back on treatment for his MAI.  He continues to smoke.  He says he is hoping to quit this week.  Currently smoking about a pack a day.  He has been feeling poorly over the past week or so.  He was in the emergency room and was diagnosed with "pneumonia."  He was started on antibiotics for that.  Past Medical History:  Diagnosis Date  . Back pain   . COPD (chronic obstructive pulmonary disease) (HCC)    emphysema   . Dyspnea    with exertion   . HIV (human immunodeficiency virus infection) ([place])   . 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 Pneumonia    11/2016  . Tobacco abuse 06/10/2013    Current Outpatient Medications  Medication Sig Dispense Refill  . diphenoxylate-atropine (LOMOTIL) 2.5-0.025 MG tablet Take 1 tablet by mouth 4 (four) times daily as needed for diarrhea or loose stools. 30 tablet 0  . dolutegravir (TIVICAY) 50 MG tablet Take 1 tablet (50 mg total) by mouth daily. 30 tablet 5  . doxycycline (VIBRA-TABS) 100 MG tablet Take 1 tablet (100 mg total) by mouth 2 (two)  times daily for 7 days. 14 tablet 0  . emtricitabine-tenofovir AF (DESCOVY) 200-25 MG tablet Take 1 tablet by mouth daily. 90 tablet 5  . ethambutol (MYAMBUTOL) 400 MG tablet Take 2.5 tablets (1,000 mg total) by mouth daily. 225 tablet 1  . oseltamivir (TAMIFLU) 75 MG capsule Take 1 capsule (75 mg total) by mouth 2 (two) times daily. 10 capsule 0  . umeclidinium-vilanterol (ANORO ELLIPTA) 62.5-25 MCG/INH AEPB Inhale 1 puff into the lungs daily. (Patient taking differently: Inhale 1 puff into the lungs daily as needed (SOB). ) 1 each 5  . azithromycin (ZITHROMAX) 600 MG tablet Take 1 tablet by mouth daily.     No current facility-administered medications for this visit.     Physical Exam BP 127/84 (BP Location: Right Arm, Patient Position: Sitting, Cuff Size: Large)   Pulse 66   Resp 18   Ht _0  (1.778 m)   Wt 137 lb 12.8 oz (62.5 kg)   SpO2 93% Comment: RA  BMI 19.9 kg/m  58 year old man in no acute distress Chronically ill-appearing Alert and oriented x3 with no focal neurologic deficit Lungs with coarse rhonchi at left base Cardiac regular rate and rhythm  Diagnostic Tests: CT CHEST WITHOUT CONTRAST  TECHNIQUE: Multidetector  CT imaging of the chest was performed following the standard protocol without IV contrast.  COMPARISON:  Multiple prior studies, including chest CTs ranging from 11/24/2016 through 10/21/2017. PET-CT 01/24/2017.  FINDINGS: Cardiovascular: Coronary artery atherosclerosis. No acute vascular findings on noncontrast imaging. The heart size is normal. There is no pericardial effusion.  Mediastinum/Nodes: There are no enlarged mediastinal, hilar or axillary lymph nodes.Hilar assessment is limited by the lack of intravenous contrast, although the hilar contours appear unchanged. The thyroid gland, trachea and esophagus demonstrate no significant findings.  Lungs/Pleura: There is no pleural effusion or pneumothorax. Again demonstrated is moderate  to severe centrilobular and paraseptal emphysema with diffuse central airway thickening. The dominant cavitary left upper lobe lesion is similar in overall size, measuring 8.0 x 3.7 cm on image 34/8 (previously 8.2 x 4.6 cm). The associated irregular wall thickening and surrounding parenchymal opacities have improved. A previously demonstrated solid component posteriorly measuring 2.3 x 2.1 cm on the previous study is smaller with central cavitation, measuring 1.8 x 1.7 cm currently (image 41/8). A separate left upper lobe nodule has also improved, now measuring 6 mm on image 67/8 (previously 17 mm). Previously demonstrated right upper lobe lesions and adjacent bronchiectasis are similar to the previous study, measuring 13 x 9 mm on image 70/8 and 18 x 10 mm on image 82/8. No new nodules identified.  Upper abdomen: The visualized upper abdomen appears stable without acute findings.  Musculoskeletal/Chest wall: There is no chest wall mass or suspicious osseous finding.  IMPRESSION: 1. The dominant thick-walled cavitary left upper lobe lesion is similar in overall size compared with the most recent study of 10/21/2017, although the wall thickening and surrounding parenchymal opacities have improved. This favors an evolving inflammatory process. Continued CT surveillance recommended. 2. Likewise, a separate left upper lobe nodule has decreased in size. Right upper lobe nodularity has not significantly changed. No new or enlarging nodules. 3. No adenopathy, pleural effusion or acute findings identified. 4.  Emphysema (ICD10-J43.9).   Electronically Signed   By: Calvin Schroeder M.D.   On: 02/09/2018 10:28 I personally reviewed the CT images and concur with the findings noted above  Impression: Mr. Calvin Schroeder is a 58 year old gentleman with history of tobacco abuse, COPD, HIV, pulmonary MAI, and Kaposi's sarcoma.  I have been following him for multiple abnormalities on chest CT  including a complex cavitary mass in the left upper lobe that turned out to be MAI infection.  He has multiple other lung nodules bilaterally.  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  He is on antibiotics.  He had questions about whether that was ever resolved.  I told him that it takes a long time but can be treated successfully.  Lung nodules-overall picture is improved particularly with regards to the complex cavitary lesion in the left upper lobe.  Other lung nodules are either improved or stable.  Given his smoking history needs continued follow-up.  I recommend another CT in 6 months.  Pneumonia-was seen in the ER last week-started on doxycycline, Tamiflu, and prednisone.  No evidence of pneumonia on CT chest.  He has bronchitis.  COPD-as by Calvin Schroeder  Plan: Return in 6 months with repeat CT chest  Calvin Nakayama, Calvin Schroeder [facility] [phone]

## 2018-02-12 ENCOUNTER — Encounter: Payer: Self-pay | Admitting: Infectious Diseases

## 2018-03-12 ENCOUNTER — Telehealth: Payer: Self-pay | Admitting: *Deleted

## 2018-03-12 ENCOUNTER — Ambulatory Visit (INDEPENDENT_AMBULATORY_CARE_PROVIDER_SITE_OTHER): Payer: Medicaid Other | Admitting: Pulmonary Disease

## 2018-03-12 ENCOUNTER — Encounter: Payer: Self-pay | Admitting: Pulmonary Disease

## 2018-03-12 VITALS — BP 124/76 | HR 82 | Ht 68.5 in | Wt 137.0 lb

## 2018-03-12 DIAGNOSIS — Z720 Tobacco use: Secondary | ICD-10-CM | POA: Diagnosis not present

## 2018-03-12 DIAGNOSIS — R400 Somnolence: Secondary | ICD-10-CM

## 2018-03-12 DIAGNOSIS — J439 Emphysema, unspecified: Secondary | ICD-10-CM

## 2018-03-12 DIAGNOSIS — B20 Human immunodeficiency virus [HIV] disease: Secondary | ICD-10-CM | POA: Diagnosis not present

## 2018-03-12 DIAGNOSIS — A310 Pulmonary mycobacterial infection: Secondary | ICD-10-CM | POA: Diagnosis not present

## 2018-03-12 MED ORDER — FLUTICASONE-UMECLIDIN-VILANT 100-62.5-25 MCG/INH IN AEPB: 1.0000 | INHALATION_SPRAY | Freq: Every day | RESPIRATORY_TRACT | 0 refills | Status: DC

## 2018-03-12 MED ORDER — FLUTICASONE-UMECLIDIN-VILANT 100-62.5-25 MCG/INH IN AEPB: 1.0000 | INHALATION_SPRAY | Freq: Every day | RESPIRATORY_TRACT | 3 refills | Status: DC

## 2018-03-12 MED ORDER — IPRATROPIUM-ALBUTEROL 0.5-2.5 (3) MG/3ML IN SOLN: 3.0000 mL | Freq: Four times a day (QID) | RESPIRATORY_TRACT | 5 refills | Status: DC | PRN

## 2018-03-12 NOTE — Telephone Encounter (Signed)
Patient called, asked for refill of lomotil.  He states that he is taking this 2-3 times per day and is happy with the results, but the prescription is only #30 without refills each time and it lasts him 10-14 days.  He would like a higher number of tablets and additional refills if possible. Please advise.  Prescription must be called in to [facility] (is controlled substance). Landis Gandy, RN

## 2018-03-12 NOTE — Telephone Encounter (Signed)
Ok to refill for 30 days.  If this is unchanged, he needs GI eval thanks

## 2018-03-12 NOTE — Patient Instructions (Addendum)
Severe COPD: Stop Anoro Start taking Trelegy 1 puff daily no matter how you feel Use albuterol as needed for chest tightness wheezing or shortness of breath We will prescribe a DuoNeb and a nebulizer: You can use this every 6 hours as needed for chest tightness wheezing or shortness of breath Practice good hand hygiene Quit smoking Stay physically active, try to exercise as much as you can: Walking 20 minutes at a time at a pace that makes you slightly breathless is a good goal.  You should also try to lift weights twice a week.  Cavitary lesion in the left upper lobe due to MAI: Stable on the most recent CT scan of your chest Continue MAI treatment  Daytime sleepiness/fatigue and trouble breathing at night: I am worried you may have sleep apnea We will check a sleep study  We will see you back in about 4 months or sooner if needed

## 2018-03-12 NOTE — Progress Notes (Signed)
Subjective:   PATIENT ID: Calvin Schroeder GENDER: male DOB: 08-14-1960, MRN: [id number]  Synopsis: Referred in March 2019 for a lung mass and emphysema.  He has a history of COPD and MAI.  His lung mass was biopsied on 02/07/2017 by Dr. 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 Diagnosed with HIV in 2019.  As of 03/2017 he was not tolerating MAI treatment due to GI side effects.  HPI  Chief Complaint  Patient presents with  . Follow-up    follows for COPD breathing doing worse.     Calvin Schroeder feels like he is going downhill. He feels increasing dyspnea on exertion and has a lot of trouble at night. He will wake up coughing often and sometimes wakes up gasping for air.  His wife has never told him that he quits breathing at night but he often struggles to breathe in the evenings.  He does feel fatigue during the daytime.  Sometimes sleepy.  He says that he has been compliant with his Anoro but he still feels like his breathing is getting worse.  He says that he has also been much more compliant with his HIV medicine, as well as the treatment for his MAI.  He has a lot of questions for me today about oxygen use.  He says that he is trying to be very careful with hand hygiene because he says anytime he gets a cold he feels like he comes down with the very severe symptoms.  Past Medical History:  Diagnosis Date  . Back pain   . COPD (chronic obstructive pulmonary disease) (HCC)    emphysema   . Dyspnea    with exertion   . HIV (human immunodeficiency virus infection) ([place])   . 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 Pneumonia    11/2016  . Tobacco abuse 06/10/2013     Review of Systems  Constitutional: Negative for fever and weight loss.  HENT: Negative for congestion, ear pain, nosebleeds and sore throat.   Eyes: Negative for redness.  Respiratory: Negative for cough, shortness of breath and wheezing.   Cardiovascular: Negative  for palpitations, leg swelling and PND.  Gastrointestinal: Negative for nausea and vomiting.  Genitourinary: Negative for dysuria.  Skin: Negative for rash.  Neurological: Negative for headaches.  Endo/Heme/Allergies: Does not bruise/bleed easily.  Psychiatric/Behavioral: Negative for depression. The patient is not nervous/anxious.       Objective:  Physical Exam   Vitals:   03/12/18 0858  BP: 124/76  Pulse: 82  SpO2: 98%  Weight: 137 lb (62.1 kg)  Height: 5' 8.5" (1.74 m)   RA  Gen: chronically ill appearing HENT: OP clear, TM's clear, neck supple PULM: Poor air movement B, normal percussion CV: RRR, no mgr, trace edema GI: BS+, soft, nontender Derm: no cyanosis or rash Psyche: normal mood and affect   CBC    Component Value Date/Time   WBC 4.8 02/05/2018 1735   RBC 4.96 02/05/2018 1735   HGB 16.9 02/05/2018 1735   HGB 14.3 12/10/2016 1042   HCT 50.0 02/05/2018 1735   HCT 42.1 12/10/2016 1042   PLT 188 02/05/2018 1735   PLT 121 (L) 12/10/2016 1042   MCV 100.8 (H) 02/05/2018 1735   MCV 98.9 (H) 12/10/2016 1042   MCH 34.1 (H) 02/05/2018 1735   MCHC 33.8 02/05/2018 1735   RDW 12.5 02/05/2018 1735   RDW 12.6 12/10/2016 1042   LYMPHSABS 0.9 02/05/2018  1735   LYMPHSABS 0.7 (L) 12/10/2016 1042   MONOABS 0.7 02/05/2018 1735   MONOABS 0.4 12/10/2016 1042   EOSABS 0.0 02/05/2018 1735   EOSABS 0.1 12/10/2016 1042   BASOSABS 0.0 02/05/2018 1735   BASOSABS 0.0 12/10/2016 1042     Chest imaging: 12/2016 Chest CT iamges reviewed showing a cavitary mass in the RUL, significant emphysema bilaterally April 2019 CT chest showed emphysema, stability in the right upper lobe mass, a new right middle lobe nodule noted, he was noted to have "advanced coronary calcification" October 2019 CT chest images independently reviewed showing a stable left upper lobe cavitary mass, some nodules in the upper lobes bilaterally are new. February 2020 CT chest images independently  reviewed showing decreased size of the thick-walled cavitary lesion in the left upper lobe, several other scattered nodules some of which are cavitary, also stable to decreased in size, centrilobular emphysema noted  PFT: June 2019 ratio 65%, FEV1 2.34 L 61% predicted, FVC 3.63 L 73% predicted  Labs:  Path: 02/2017 LUL mass path: granulomas, special stains negative  Micro: 02/2017 LUL BAL: MAC  Echo:  Heart Catheterization:  Records from his visit with infectious diseases in December reviewed where he was doing well with his antiretroviral therapy as well as his MAI treatment.  I also reviewed the records from his visit with thoracic surgery last month where he was seen for his cavitary upper lobe nodules which are decreasing in size with MAI treatment and they elected to continue monitoring his cavitary nodule with serial CT scans per.     Assessment & Plan:   Daytime somnolence - Plan: Split night study, CANCELED: Home sleep test  Pulmonary emphysema, unspecified emphysema type ([place]) - Plan: Ambulatory Referral for DME  Tobacco abuse  MAI (mycobacterium avium-intracellulare) ([place])  HIV infection, unspecified symptom status ([place])  Discussion: Egan feels that his shortness of breath is worsened.  I explained to him today that that is very likely a multifactorial problem and could be due to his worsening COPD but could also be related to physical deconditioning from lack of exercise.  We talked about changing his medicines from Anoro to Trelegy to see if this helps and I encouraged him to quit smoking, eat a healthy diet, and exercise as much as possible.  Plan: Severe COPD: Stop Anoro Start taking Trelegy 1 puff daily no matter how you feel Use albuterol as needed for chest tightness wheezing or shortness of breath We will prescribe a DuoNeb and a nebulizer: You can use this every 6 hours as needed for chest tightness wheezing or shortness of breath Practice good hand  hygiene Stay physically active, try to exercise as much as you can: Walking 20 minutes at a time at a pace that makes you slightly breathless is a good goal.  You should also try to lift weights twice a week.  Cavitary lesion in the left upper lobe due to MAI: Stable on the most recent CT scan of your chest Continue MAI treatment  Cigarette smoking: encouraged at length to quit smoking today  Daytime sleepiness/fatigue and trouble breathing at night: I am worried you may have sleep apnea We will check a sleep study  We will see you back in about 4 months or sooner if needed  Greater than 50% of this 30-minute visit was spent face-to-face    Current Outpatient Medications:  .  azithromycin (ZITHROMAX) 600 MG tablet, Take 1 tablet by mouth daily., Disp: , Rfl:  .  diphenoxylate-atropine (  LOMOTIL) 2.5-0.025 MG tablet, Take 1 tablet by mouth 4 (four) times daily as needed for diarrhea or loose stools., Disp: 30 tablet, Rfl: 0 .  dolutegravir (TIVICAY) 50 MG tablet, Take 1 tablet (50 mg total) by mouth daily., Disp: 30 tablet, Rfl: 5 .  emtricitabine-tenofovir AF (DESCOVY) 200-25 MG tablet, Take 1 tablet by mouth daily., Disp: 90 tablet, Rfl: 5 .  ethambutol (MYAMBUTOL) 400 MG tablet, Take 2.5 tablets (1,000 mg total) by mouth daily., Disp: 225 tablet, Rfl: 1 .  Fluticasone-Umeclidin-Vilant (TRELEGY ELLIPTA) 100-62.5-25 MCG/INH AEPB, Inhale 1 puff into the lungs daily., Disp: 1 each, Rfl: 3 .  Fluticasone-Umeclidin-Vilant (TRELEGY ELLIPTA) 100-62.5-25 MCG/INH AEPB, Inhale 1 puff into the lungs daily., Disp: 1 each, Rfl: 0 .  ipratropium-albuterol (DUONEB) 0.5-2.5 (3) MG/3ML SOLN, Take 3 mLs by nebulization every 6 (six) hours as needed., Disp: 360 mL, Rfl: 5

## 2018-03-13 ENCOUNTER — Other Ambulatory Visit: Payer: Self-pay

## 2018-03-13 DIAGNOSIS — B20 Human immunodeficiency virus [HIV] disease: Secondary | ICD-10-CM

## 2018-03-13 DIAGNOSIS — J449 Chronic obstructive pulmonary disease, unspecified: Secondary | ICD-10-CM | POA: Diagnosis not present

## 2018-03-13 DIAGNOSIS — A310 Pulmonary mycobacterial infection: Secondary | ICD-10-CM

## 2018-03-13 MED ORDER — DIPHENOXYLATE-ATROPINE 2.5-0.025 MG PO TABS: 1.0000 | ORAL_TABLET | Freq: Four times a day (QID) | ORAL | 0 refills | Status: DC | PRN

## 2018-03-13 NOTE — Progress Notes (Signed)
Phone-in authorization to pharmacy  for lomotil to pharmacy. 0 refills. Per Dr. [name].  Eugenia Mcalpine, LPN

## 2018-03-16 ENCOUNTER — Other Ambulatory Visit: Payer: Medicaid Other

## 2018-03-16 ENCOUNTER — Other Ambulatory Visit (HOSPITAL_COMMUNITY)
Admission: RE | Admit: 2018-03-16 | Discharge: 2018-03-16 | Disposition: A | Discharge status: Home / Self Care | Payer: Medicaid Other | Source: Ambulatory Visit | Attending: Infectious Diseases | Admitting: Infectious Diseases

## 2018-03-16 DIAGNOSIS — R400 Somnolence: Secondary | ICD-10-CM

## 2018-03-16 DIAGNOSIS — B20 Human immunodeficiency virus [HIV] disease: Secondary | ICD-10-CM

## 2018-03-16 DIAGNOSIS — Z79899 Other long term (current) drug therapy: Secondary | ICD-10-CM

## 2018-03-16 DIAGNOSIS — Z113 Encounter for screening for infections with a predominantly sexual mode of transmission: Secondary | ICD-10-CM

## 2018-03-17 LAB — T-HELPER CELL (CD4) - (RCID CLINIC ONLY)
CD4 % Helper T Cell: 19 % — ABNORMAL LOW (ref 33–55)
CD4 T Cell Abs: 230 /uL — ABNORMAL LOW (ref 400–2700)

## 2018-03-17 LAB — URINE CYTOLOGY ANCILLARY ONLY
Chlamydia: NEGATIVE
Neisseria Gonorrhea: NEGATIVE

## 2018-03-18 DIAGNOSIS — M545 Low back pain: Secondary | ICD-10-CM | POA: Diagnosis not present

## 2018-03-18 DIAGNOSIS — B20 Human immunodeficiency virus [HIV] disease: Secondary | ICD-10-CM | POA: Diagnosis not present

## 2018-03-18 DIAGNOSIS — A310 Pulmonary mycobacterial infection: Secondary | ICD-10-CM | POA: Diagnosis not present

## 2018-03-18 DIAGNOSIS — G8929 Other chronic pain: Secondary | ICD-10-CM | POA: Diagnosis not present

## 2018-03-18 DIAGNOSIS — C469 Kaposi's sarcoma, unspecified: Secondary | ICD-10-CM | POA: Diagnosis not present

## 2018-03-18 DIAGNOSIS — J449 Chronic obstructive pulmonary disease, unspecified: Secondary | ICD-10-CM | POA: Diagnosis not present

## 2018-03-18 DIAGNOSIS — Z5320 Procedure and treatment not carried out because of patient's decision for unspecified reasons: Secondary | ICD-10-CM | POA: Diagnosis not present

## 2018-03-18 LAB — COMPLETE METABOLIC PANEL WITH GFR
AG Ratio: 1.1 (calc) (ref 1.0–2.5)
ALT: 10 U/L (ref 9–46)
AST: 14 U/L (ref 10–35)
Albumin: 4.2 g/dL (ref 3.6–5.1)
Alkaline phosphatase (APISO): 109 U/L (ref 35–144)
BUN: 13 mg/dL (ref 7–25)
CO2: 29 mmol/L (ref 20–32)
Calcium: 9.3 mg/dL (ref 8.6–10.3)
Chloride: 101 mmol/L (ref 98–110)
Creat: 0.86 mg/dL (ref 0.70–1.33)
GFR, Est African American: 113 mL/min/{1.73_m2} (ref >=60)
GFR, Est Non African American: 98 mL/min/{1.73_m2} (ref >=60)
GLUCOSE: 100 mg/dL — AB (ref 65–99)
Globulin: 3.7 g/dL (calc) (ref 1.9–3.7)
Potassium: 4.9 mmol/L (ref 3.5–5.3)
Sodium: 137 mmol/L (ref 135–146)
Total Bilirubin: 0.4 mg/dL (ref 0.2–1.2)
Total Protein: 7.9 g/dL (ref 6.1–8.1)

## 2018-03-18 LAB — CBC
HCT: 40.7 % (ref 38.5–50.0)
Hemoglobin: 14.5 g/dL (ref 13.2–17.1)
MCH: 35.3 pg — ABNORMAL HIGH (ref 27.0–33.0)
MCHC: 35.6 g/dL (ref 32.0–36.0)
MCV: 99 fL (ref 80.0–100.0)
MPV: 9.1 fL (ref 7.5–12.5)
Platelets: 279 10*3/uL (ref 140–400)
RBC: 4.11 10*6/uL — ABNORMAL LOW (ref 4.20–5.80)
RDW: 12.3 % (ref 11.0–15.0)
WBC: 5.6 10*3/uL (ref 3.8–10.8)

## 2018-03-18 LAB — LIPID PANEL
Cholesterol: 177 mg/dL (ref <200)
HDL: 80 mg/dL (ref >=40)
LDL Cholesterol (Calc): 84 mg/dL (calc)
Non-HDL Cholesterol (Calc): 97 mg/dL (calc) (ref <130)
Total CHOL/HDL Ratio: 2.2 (calc) (ref <5.0)
Triglycerides: 47 mg/dL (ref <150)

## 2018-03-18 LAB — RPR: RPR Ser Ql: NONREACTIVE

## 2018-03-18 LAB — HIV-1 RNA QUANT-NO REFLEX-BLD
HIV 1 RNA Quant: <20 copies/mL — AB
HIV-1 RNA Quant, Log: <1.3 Log copies/mL — AB

## 2018-03-23 ENCOUNTER — Telehealth: Payer: Self-pay | Admitting: Pulmonary Disease

## 2018-03-23 NOTE — Telephone Encounter (Addendum)
Received PA for Trelegy Ellipta from [facility]. Contacted [phone] to initate PA and was informed they do have substitution meds which are authorized:  Symbicort, Dulera and Advair are allowable under insurance plan.  Will inform  provider Dr. Lake Bells to see if substitutions can be prescribed and if not can call back and proceed with PA.  Ref# for todays encounter is [id number].    Dr. [place] Bells please advise if you want to try insurance suggested alternatives before full PA initiated.  Thank you.

## 2018-03-24 NOTE — Telephone Encounter (Signed)
advair 250/50 Needs spiriva respimat 2.5 2 puff daily

## 2018-03-24 NOTE — Telephone Encounter (Signed)
Called and spoke with Patient. Dr [place] Bells recommendations given.  Patient stated understanding.  Patient stated he has Trelegy sample left and would like to finish it before any further inhaler prescriptions were sent to pharmacy. Patient stated he would call in 2 weeks, when he was ready for prescriptions.  Will leave message open until Patient calls in 2 weeks ( around 04/07/18)

## 2018-03-24 NOTE — Telephone Encounter (Signed)
Called patient unable to reach LMTCB 

## 2018-03-24 NOTE — Telephone Encounter (Signed)
Patient is returning phone call.  Patient phone number is [phone].

## 2018-03-26 ENCOUNTER — Other Ambulatory Visit: Payer: Self-pay | Admitting: Infectious Diseases

## 2018-03-26 DIAGNOSIS — B20 Human immunodeficiency virus [HIV] disease: Secondary | ICD-10-CM

## 2018-03-26 DIAGNOSIS — A310 Pulmonary mycobacterial infection: Secondary | ICD-10-CM

## 2018-03-27 ENCOUNTER — Other Ambulatory Visit: Payer: Self-pay

## 2018-03-27 DIAGNOSIS — A310 Pulmonary mycobacterial infection: Secondary | ICD-10-CM

## 2018-03-27 DIAGNOSIS — B20 Human immunodeficiency virus [HIV] disease: Secondary | ICD-10-CM

## 2018-03-27 MED ORDER — DIPHENOXYLATE-ATROPINE 2.5-0.025 MG PO TABS: ORAL_TABLET | ORAL | 0 refills | Status: DC

## 2018-03-27 NOTE — Progress Notes (Signed)
Prescription phone-in to [facility]. Left voicemail with pharmacy staff.  [place]

## 2018-03-27 NOTE — Addendum Note (Signed)
Addended by: Aundria Rud on: 03/27/2018 09:51 AM   Modules accepted: Orders

## 2018-03-27 NOTE — Telephone Encounter (Signed)
Patient calling asking for refill request for Azithromycin. Routing message to MD for advise. Request for lomotil approved.

## 2018-03-30 ENCOUNTER — Ambulatory Visit (INDEPENDENT_AMBULATORY_CARE_PROVIDER_SITE_OTHER): Payer: Medicaid Other | Admitting: Infectious Diseases

## 2018-03-30 ENCOUNTER — Encounter: Payer: Self-pay | Admitting: Infectious Diseases

## 2018-03-30 ENCOUNTER — Other Ambulatory Visit: Payer: Self-pay

## 2018-03-30 VITALS — BP 169/93 | HR 66 | Wt 136.0 lb

## 2018-03-30 DIAGNOSIS — R918 Other nonspecific abnormal finding of lung field: Secondary | ICD-10-CM | POA: Diagnosis not present

## 2018-03-30 DIAGNOSIS — L723 Sebaceous cyst: Secondary | ICD-10-CM | POA: Insufficient documentation

## 2018-03-30 DIAGNOSIS — Z0000 Encounter for general adult medical examination without abnormal findings: Secondary | ICD-10-CM | POA: Diagnosis not present

## 2018-03-30 DIAGNOSIS — C469 Kaposi's sarcoma, unspecified: Secondary | ICD-10-CM

## 2018-03-30 DIAGNOSIS — Z720 Tobacco use: Secondary | ICD-10-CM

## 2018-03-30 DIAGNOSIS — Z113 Encounter for screening for infections with a predominantly sexual mode of transmission: Secondary | ICD-10-CM | POA: Diagnosis not present

## 2018-03-30 DIAGNOSIS — Z79899 Other long term (current) drug therapy: Secondary | ICD-10-CM | POA: Diagnosis not present

## 2018-03-30 DIAGNOSIS — B20 Human immunodeficiency virus [HIV] disease: Secondary | ICD-10-CM

## 2018-03-30 DIAGNOSIS — I10 Essential (primary) hypertension: Secondary | ICD-10-CM | POA: Diagnosis not present

## 2018-03-30 DIAGNOSIS — A310 Pulmonary mycobacterial infection: Secondary | ICD-10-CM

## 2018-03-30 MED ORDER — AZITHROMYCIN 600 MG PO TABS: 600.0000 mg | ORAL_TABLET | Freq: Every day | ORAL | 3 refills | Status: DC

## 2018-03-30 MED ORDER — DIPHENOXYLATE-ATROPINE 2.5-0.025 MG PO TABS: ORAL_TABLET | ORAL | 3 refills | Status: DC

## 2018-03-30 NOTE — Assessment & Plan Note (Signed)
He has established with PCP.  He needs colon, he has deffered so far

## 2018-03-30 NOTE — Assessment & Plan Note (Signed)
He will f/u with PCP Is asx.

## 2018-03-30 NOTE — Assessment & Plan Note (Signed)
Area on L eye brow continues to accumulate. He had derm appt.  He will f/u with them.

## 2018-03-30 NOTE — Assessment & Plan Note (Signed)
Some improvement on recent CT. Will continue to watch.

## 2018-03-30 NOTE — Progress Notes (Signed)
   Subjective:    Patient ID: Calvin Schroeder, male    DOB: 1960/01/31, 58 y.o.   MRN: [id number]  HPI 58 yo M previously found to have cystic lesion on chest CT on adm for PNA. 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 He was seen in [place] found to have AIDS.  He was started on E/A/R however developed n/v (that persisted despite zofran) He was started on tivicay-truvada. He has had f/u with pulm for COPD. He has also f/u with CVTS for a cyctic mass in LUL.  He had f/u CT scan 02-09-18: 1. The dominant thick-walled cavitary left upper lobe lesion is similar in overall size compared with the most recent study of 10/21/2017, although the wall thickening and surrounding parenchymal opacities have improved. This favors an evolving inflammatory process. Continued CT surveillance recommended. 2. Likewise, a separate left upper lobe nodule has decreased in size. Right upper lobe nodularity has not significantly changed. No new or enlarging nodules. 3. No adenopathy, pleural effusion or acute findings identified. 4.  Emphysema (ICD10-J43.9).  He has also been seen by Onc for KS.  Restarted MAI rx 11-05-17. He states he took this starting in Dec.   Today states that he has been feeling well. No problems with ART.  Has been taking lomotil for diarrhea from his MAI rx.    HIV 1 RNA Quant (copies/mL)  Date Value  03/16/2018 <20 DETECTED (A)  11/05/2017 <20 NOT DETECTED  06/09/2017 109 (H)   CD4 T Cell Abs (/uL)  Date Value  03/16/2018 230 (L)  11/05/2017 240 (L)  06/09/2017 260 (L)    Review of Systems  Constitutional: Negative for appetite change, chills, fever and unexpected weight change.  Gastrointestinal: Positive for diarrhea. Negative for constipation, nausea and vomiting.  Genitourinary: Negative for difficulty urinating.  Neurological: Negative for headaches.  Please see HPI. All other systems reviewed and negative.      Objective:   Physical Exam Constitutional:      Appearance:  Normal appearance.  HENT:     Mouth/Throat:     Mouth: Mucous membranes are moist.     Pharynx: No oropharyngeal exudate.  Eyes:     Extraocular Movements: Extraocular movements intact.     Pupils: Pupils are equal, round, and reactive to light.  Neck:     Musculoskeletal: Normal range of motion and neck supple.  Cardiovascular:     Rate and Rhythm: Normal rate and regular rhythm.  Pulmonary:     Effort: Pulmonary effort is normal.     Breath sounds: Normal breath sounds.  Abdominal:     General: Abdomen is flat. Bowel sounds are normal. There is no distension.     Tenderness: There is no abdominal tenderness.  Musculoskeletal:        General: No swelling.     Right lower leg: No edema.     Left lower leg: No edema.  Neurological:     General: No focal deficit present.     Mental Status: He is alert.  Psychiatric:        Mood and Affect: Mood normal.           Assessment & Plan:

## 2018-03-30 NOTE — Progress Notes (Signed)
Patient ID: Calvin Schroeder, male   DOB: 1960-08-16, 58 y.o.   MRN: [id number]

## 2018-03-30 NOTE — Assessment & Plan Note (Signed)
Have somewhat improved.  He had f/u with Onc.  He missed last, he will f/u.

## 2018-03-30 NOTE — Assessment & Plan Note (Signed)
encouraged to quit.  (not interested)

## 2018-03-30 NOTE — Assessment & Plan Note (Signed)
He is doing ok on 2 drug rx. His CT shows some improvement.  Continue to use prn anti-diarrheals.

## 2018-03-30 NOTE — Assessment & Plan Note (Signed)
Offered/refused condoms.  Doing well, CD4 stable Flu up to date.  Will see him back in 9 months.  Needs colon.

## 2018-04-13 ENCOUNTER — Other Ambulatory Visit: Payer: Self-pay | Admitting: Infectious Diseases

## 2018-04-13 ENCOUNTER — Other Ambulatory Visit: Payer: Self-pay

## 2018-04-13 DIAGNOSIS — B20 Human immunodeficiency virus [HIV] disease: Secondary | ICD-10-CM

## 2018-04-13 MED ORDER — DOLUTEGRAVIR SODIUM 50 MG PO TABS: 50.0000 mg | ORAL_TABLET | Freq: Every day | ORAL | 5 refills | Status: DC

## 2018-04-13 MED ORDER — EMTRICITABINE-TENOFOVIR AF 200-25 MG PO TABS: 1.0000 | ORAL_TABLET | Freq: Every day | ORAL | 5 refills | Status: DC

## 2018-04-21 ENCOUNTER — Encounter (HOSPITAL_BASED_OUTPATIENT_CLINIC_OR_DEPARTMENT_OTHER): Payer: Medicaid Other

## 2018-05-02 ENCOUNTER — Other Ambulatory Visit: Payer: Self-pay | Admitting: Infectious Diseases

## 2018-05-02 DIAGNOSIS — B20 Human immunodeficiency virus [HIV] disease: Secondary | ICD-10-CM

## 2018-05-02 DIAGNOSIS — A310 Pulmonary mycobacterial infection: Secondary | ICD-10-CM

## 2018-05-05 ENCOUNTER — Other Ambulatory Visit: Payer: Self-pay

## 2018-05-05 DIAGNOSIS — B20 Human immunodeficiency virus [HIV] disease: Secondary | ICD-10-CM

## 2018-05-05 DIAGNOSIS — A310 Pulmonary mycobacterial infection: Secondary | ICD-10-CM

## 2018-05-05 MED ORDER — DIPHENOXYLATE-ATROPINE 2.5-0.025 MG PO TABS: ORAL_TABLET | ORAL | 0 refills | Status: DC

## 2018-06-05 ENCOUNTER — Other Ambulatory Visit (HOSPITAL_COMMUNITY)
Admission: RE | Admit: 2018-06-05 | Discharge: 2018-06-05 | Disposition: A | Discharge status: Home / Self Care | Payer: Medicaid Other | Source: Ambulatory Visit | Attending: Pulmonary Disease | Admitting: Pulmonary Disease

## 2018-06-05 DIAGNOSIS — Z1159 Encounter for screening for other viral diseases: Secondary | ICD-10-CM | POA: Insufficient documentation

## 2018-06-06 LAB — NOVEL CORONAVIRUS, NAA (HOSP ORDER, SEND-OUT TO REF LAB; TAT 18-24 HRS): SARS-CoV-2, NAA: NOT DETECTED

## 2018-06-08 ENCOUNTER — Other Ambulatory Visit: Payer: Self-pay

## 2018-06-08 ENCOUNTER — Ambulatory Visit (HOSPITAL_BASED_OUTPATIENT_CLINIC_OR_DEPARTMENT_OTHER): Payer: Medicaid Other | Attending: Pulmonary Disease | Admitting: Pulmonary Disease

## 2018-06-08 VITALS — Ht 70.0 in | Wt 150.0 lb

## 2018-06-08 DIAGNOSIS — G4733 Obstructive sleep apnea (adult) (pediatric): Secondary | ICD-10-CM | POA: Diagnosis not present

## 2018-06-08 DIAGNOSIS — R400 Somnolence: Secondary | ICD-10-CM

## 2018-06-08 DIAGNOSIS — R0902 Hypoxemia: Secondary | ICD-10-CM | POA: Diagnosis not present

## 2018-06-16 DIAGNOSIS — G4733 Obstructive sleep apnea (adult) (pediatric): Secondary | ICD-10-CM | POA: Diagnosis not present

## 2018-06-16 NOTE — Procedures (Signed)
Patient Name: Calvin Schroeder, Reader Date: 06/08/2018 Gender: Male D.O.B: May 14, 1960 Age (years): 57 Referring Provider: Simonne Maffucci Height (inches): 31 Interpreting Physician: Kara Mead MD, ABSM Weight (lbs): 150 RPSGT: Laren Everts BMI: 22 MRN: [id number] Neck Size: 15.50 <br> <br> CLINICAL INFORMATION Sleep Study Type: NPSG    Indication for sleep study: COPD, Excessive Daytime Sleepiness, Sleep walking/talking/parasomnias    Epworth Sleepiness Score: 0    SLEEP STUDY TECHNIQUE As per the AASM Manual for the Scoring of Sleep and Associated Events v2.3 (April 2016) with a hypopnea requiring 4% desaturations.  The channels recorded and monitored were frontal, central and occipital EEG, electrooculogram (EOG), submentalis EMG (chin), nasal and oral airflow, thoracic and abdominal wall motion, anterior tibialis EMG, snore microphone, electrocardiogram, and pulse oximetry.  MEDICATIONS Medications self-administered by patient taken the night of the study : N/A  SLEEP ARCHITECTURE The study was initiated at 10:22:35 PM and ended at 4:25:17 AM.  Sleep onset time was 17.5 minutes and the sleep efficiency was 79.6%%. The total sleep time was 288.7 minutes.  Stage REM latency was 54.5 minutes.  The patient spent 13.0%% of the night in stage N1 sleep, 72.1%% in stage N2 sleep, 0.5%% in stage N3 and 14.4% in REM.  Alpha intrusion was absent.  Supine sleep was 67.01%.  RESPIRATORY PARAMETERS The overall apnea/hypopnea index (AHI) was 5.2 per hour. There were 2 total apneas, including 1 obstructive, 1 central and 0 mixed apneas. There were 23 hypopneas and 41 RERAs.  The AHI during Stage REM sleep was 23.1 per hour.  AHI while supine was 7.4 per hour.  The mean oxygen saturation was 91.1%. The minimum SpO2 during sleep was 84.0%.  moderate snoring was noted during this study.  CARDIAC DATA The 2 lead EKG demonstrated sinus rhythm. The mean heart rate was 63.4  beats per minute. Other EKG findings include: PVCs. LEG MOVEMENT DATA The total PLMS were 0 with a resulting PLMS index of 0.0. Associated arousal with leg movement index was 0.0 .  IMPRESSIONS - Mild obstructive sleep apnea occurred during this study (AHI = 5.2/h, RDI 13.7/h) - No significant central sleep apnea occurred during this study (CAI = 0.2/h). - Mild oxygen desaturation was noted during this study (Min O2 = 84.0%). - The patient snored with moderate snoring volume. - EKG findings include PVCs. - Clinically significant periodic limb movements did not occur during sleep. No significant associated arousals.   DIAGNOSIS - Obstructive Sleep Apnea (327.23 [G47.33 ICD-10]) - Nocturnal Hypoxemia (327.26 [G47.36 ICD-10]) during REM sleep   RECOMMENDATIONS - Positional therapy avoiding supine position during sleep. - Very mild obstructive sleep apnea. The cardiovascular significance of mild OSA is minimal.  Treatment options include no treatment since his ESS is 0  or oral appliance / CPAP therapy if very symptomatic - Avoid alcohol, sedatives and other CNS depressants that may worsen sleep apnea and disrupt normal sleep architecture. Note that he had 5 drinks prior to arrival for this test which could have affected study. - Sleep hygiene should be reviewed to assess factors that may improve sleep quality. - Weight management and regular exercise should be initiated or continued if appropriate.  Kara Mead MD Board Certified in [place]

## 2018-06-22 DIAGNOSIS — B20 Human immunodeficiency virus [HIV] disease: Secondary | ICD-10-CM | POA: Diagnosis not present

## 2018-06-22 DIAGNOSIS — Z0000 Encounter for general adult medical examination without abnormal findings: Secondary | ICD-10-CM | POA: Diagnosis not present

## 2018-06-22 DIAGNOSIS — D751 Secondary polycythemia: Secondary | ICD-10-CM | POA: Diagnosis not present

## 2018-06-22 DIAGNOSIS — Z125 Encounter for screening for malignant neoplasm of prostate: Secondary | ICD-10-CM | POA: Diagnosis not present

## 2018-06-22 DIAGNOSIS — Z23 Encounter for immunization: Secondary | ICD-10-CM | POA: Diagnosis not present

## 2018-06-22 DIAGNOSIS — Z1211 Encounter for screening for malignant neoplasm of colon: Secondary | ICD-10-CM | POA: Diagnosis not present

## 2018-06-24 DIAGNOSIS — M25511 Pain in right shoulder: Secondary | ICD-10-CM | POA: Diagnosis not present

## 2018-06-24 DIAGNOSIS — M545 Low back pain: Secondary | ICD-10-CM | POA: Diagnosis not present

## 2018-06-24 DIAGNOSIS — Z79891 Long term (current) use of opiate analgesic: Secondary | ICD-10-CM | POA: Diagnosis not present

## 2018-06-24 DIAGNOSIS — B20 Human immunodeficiency virus [HIV] disease: Secondary | ICD-10-CM | POA: Diagnosis not present

## 2018-06-24 DIAGNOSIS — M542 Cervicalgia: Secondary | ICD-10-CM | POA: Diagnosis not present

## 2018-06-24 DIAGNOSIS — M25512 Pain in left shoulder: Secondary | ICD-10-CM | POA: Diagnosis not present

## 2018-06-24 DIAGNOSIS — G894 Chronic pain syndrome: Secondary | ICD-10-CM | POA: Diagnosis not present

## 2018-06-26 ENCOUNTER — Other Ambulatory Visit: Payer: Self-pay | Admitting: Infectious Diseases

## 2018-06-26 DIAGNOSIS — Z736 Limitation of activities due to disability: Secondary | ICD-10-CM

## 2018-06-26 DIAGNOSIS — A310 Pulmonary mycobacterial infection: Secondary | ICD-10-CM

## 2018-06-26 DIAGNOSIS — B20 Human immunodeficiency virus [HIV] disease: Secondary | ICD-10-CM

## 2018-06-30 NOTE — Progress Notes (Signed)
_0  ID: Calvin Schroeder, male    DOB: Jan 20, 1960, 58 y.o.   MRN: [id number]  Chief Complaint  Patient presents with  . Follow-up    COPD follow up, sleep study results    Referring provider: No ref. provider found  HPI:  58 year old male current smoker referred in March/2019 for lung mass and emphysema.  Lung mass was biopsied 02/07/2017 by Dr. 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  Patient was also diagnosed with HIV in 2019. 03/2017 he was not tolerating MAI treatment due to GI side effects.  Hope to restart treatment when CD4 counts are above 200.  PMH: HIV positive (followed by Dr. [name] with infectious disease) Smoker/ Smoking History: Current smoker. 40 pack year. 1 ppd.  Maintenance: Trelegy Ellipta  Pt of: Dr. Lake Bells  07/01/2018  - Visit   58 year old male current every day smoker (smoking 1 pack/day) followed in our office for emphysema as well as a lung mass.  Patient is followed by [facility] for the lung mass.  Patient is also followed by infectious disease Dr. [name] as he is HIV positive.  Patient was switched to Trelegy Ellipta which is insurance is reluctant to pay for so we need to switch his inhalers today.  Patient also would like to discuss the results of his sleep study that he recently completed that was mild obstructive sleep apnea, specifically with more events during supine position sleep.  Recommendations were for positional therapy.  Patient is not interested in positional therapy or an oral appliance he would like to discuss CPAP therapy today.  Overall patient reports that he is feeling the best he has felt in months.  He has been off of his Trelegy Ellipta for the last week due to cost.  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, new right middle lobe infectious nodularity mild right hilar adenopathy, 4 mm right upper lobe pulmonary nodule is new  >>>consider noncontrast CT in 12 months  February 2020 CT chest images showing decreased size of the thick-walled cavitary lesion in the left upper lobe, several other scattered nodules some of which are cavitary, also stable to decreased in size, centrilobular emphysema noted   06/11/2017- spirometry-FVC 3.63 (73% predicted), ratio 65, FEV1 - 61 >>>moderate airflow obstruction  06/08/2018 - split night sleep study- Mild obstructive sleep apnea occurred during this study (AHI =  5.2/h, RDI 13.7/h)     FENO:  No results found for: NITRICOXIDE  PFT: PFT Results Latest Ref Rng & Units 06/11/2017  FVC-Pre L 3.63  FVC-Predicted Pre % 73  Pre FEV1/FVC % % 65  FEV1-Pre L 2.34  FEV1-Predicted Pre % 61    Imaging: No results found.    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, new right middle lobe infectious nodularity mild right hilar adenopathy, 4 mm right upper lobe pulmonary nodule is new consider noncontrast CT in 12 months  February 2020 CT chest images showing decreased size of the thick-walled cavitary lesion in  the left upper lobe, several other scattered nodules some of which are cavitary, also stable to decreased in size, centrilobular emphysema noted       GOLD COPD II B    06/11/2017- spirometry-FVC 3.63 (73% predicted), ratio 65, FEV1 - 61 >>>moderate airflow obstruction       OSA (obstructive sleep apnea)     06/08/2018 - split night sleep study- Mild obstructive sleep apnea occurred during this study (AHI =  5.2/h, RDI 13.7/h)   RECOMMENDATIONS  - Positional therapy avoiding supine position during sleep.  - Very mild obstructive sleep apnea. The cardiovascular  significance of mild OSA is minimal. Treatment options include  no treatment since his ESS is 0 or oral appliance / CPAP therapy  if very symptomatic         Allergies  Allergen Reactions  . Vicodin [Hydrocodone-Acetaminophen] Itching and Rash    Immunization History  Administered Date(s) Administered  . Hepatitis A, Adult 04/09/2017, 07/28/2017  . Influenza,inj,Quad PF,6+ Mos 04/09/2017, 10/21/2017  . Pneumococcal Conjugate-13 06/09/2017  . Pneumococcal Polysaccharide-23 04/09/2017  . Pneumococcal-Unspecified 04/09/2017    Past Medical History:  Diagnosis Date  . Back pain   . COPD (chronic obstructive pulmonary disease) (HCC)    emphysema   . Dyspnea    with exertion   . HIV (human immunodeficiency virus infection) ([place])   . 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 Pneumonia    11/2016  . Tobacco abuse 06/10/2013    Tobacco History: Social History   Tobacco Use  Smoking Status Current Every Day Smoker  . Packs/day: 1.00  . Years: 40.00  . Pack years: 40.00  Smokeless Tobacco Never Used  Tobacco Comment   1 pack per day currently, quit date set for 12/23/17 per pt    Ready to quit: No Counseling given: Yes Comment: 1 pack per day currently, quit date set for 12/23/17 per pt   Smoking assessment and cessation counseling  Patient currently smoking: 1 ppd I have advised the patient to quit/stop smoking  as soon as possible due to high risk for multiple medical problems.  It will also be very difficult for [place] to manage patient's  respiratory symptoms and status if we continue to expose her lungs to a known irritant.  We do not advise e-cigarettes as a form of stopping smoking.  Patient is not willing to quit smoking.  I have advised the patient that we can assist and have options of nicotine replacement therapy, provided smoking cessation education today, provided smoking cessation counseling, and provided cessation resources.  Follow-up next office visit office visit for assessment of smoking cessation.  Smoking cessation counseling advised for: 3 min   Outpatient Encounter Medications as of 07/01/2018  Medication Sig  . azithromycin (ZITHROMAX) 600 MG tablet Take 1 tablet (600 mg total) by mouth daily.  . diphenoxylate-atropine (LOMOTIL) 2.5-0.025 MG tablet TAKE 1 BY MOUTH FOUR TIMES DAILY AS NEEDED FOR DIARRHEA  . dolutegravir (TIVICAY) 50 MG tablet Take 1 tablet (50 mg total) by mouth daily.  Marland Kitchen emtricitabine-tenofovir AF (DESCOVY) 200-25 MG tablet Take 1 tablet by mouth daily.  Marland Kitchen  ipratropium-albuterol (DUONEB) 0.5-2.5 (3) MG/3ML SOLN Take 3 mLs by nebulization every 6 (six) hours as needed.  . budesonide-formoterol (SYMBICORT) 160-4.5 MCG/ACT inhaler Inhale 2 puffs into the lungs daily.  Marland Kitchen ethambutol (MYAMBUTOL) 400 MG tablet   . Fluticasone-Umeclidin-Vilant (TRELEGY ELLIPTA) 100-62.5-25 MCG/INH AEPB Inhale 1 puff into the lungs daily. (Patient not taking: Reported on 07/01/2018)  . Tiotropium Bromide Monohydrate (SPIRIVA RESPIMAT) 2.5 MCG/ACT AERS Inhale 2 puffs into the lungs daily.  . [DISCONTINUED] Fluticasone-Umeclidin-Vilant (TRELEGY ELLIPTA) 100-62.5-25 MCG/INH AEPB Inhale 1 puff into the lungs daily. (Patient not taking: Reported on 07/01/2018)   No facility-administered encounter medications on file as of 07/01/2018.      Review of Systems  Review of Systems  Constitutional:  Positive for fatigue. Negative for activity change, chills, fever and unexpected weight change.  HENT: Negative for postnasal drip, rhinorrhea, sinus pressure, sinus pain, sneezing and sore throat.   Eyes: Negative.   Respiratory: Positive for shortness of breath. Negative for cough and wheezing.   Cardiovascular: Negative for chest pain and palpitations.  Gastrointestinal: Negative for diarrhea, nausea and vomiting.  Endocrine: Negative.   Musculoskeletal: Negative.   Skin: Negative.   Neurological: Negative for dizziness and headaches.  Psychiatric/Behavioral: Negative.  Negative for dysphoric mood. The patient is not nervous/anxious.   All other systems reviewed and are negative.    Physical Exam  BP 110/76 (BP Location: Left Arm, Patient Position: Sitting, Cuff Size: Normal)   Pulse 71   Temp 98.2 F (36.8 C)   Wt 141 lb (64 kg)   SpO2 98%   BMI 20.23 kg/m   Wt Readings from Last 5 Encounters:  07/01/18 141 lb (64 kg)  06/08/18 150 lb (68 kg)  03/30/18 136 lb (61.7 kg)  03/12/18 137 lb (62.1 kg)  02/10/18 137 lb 12.8 oz (62.5 kg)     Physical Exam  Constitutional: He is oriented to person, place, and time and well-developed, well-nourished, and in no distress. No distress.  Thin frail chronically ill male  HENT:  Head: Normocephalic and atraumatic.  Right Ear: Hearing, tympanic membrane, external ear and ear canal normal.  Left Ear: Hearing, tympanic membrane, external ear and ear canal normal.  Eyes: Pupils are equal, round, and reactive to light.  Neck: Normal range of motion. Neck supple.  Cardiovascular: Normal rate, regular rhythm and normal heart sounds.  Pulmonary/Chest: Effort normal and breath sounds normal. No accessory muscle usage. No respiratory distress. He has no decreased breath sounds. He has no wheezes. He has no rhonchi.  Abdominal: Soft. Bowel sounds are normal. There is no abdominal tenderness.  Musculoskeletal: Normal range of motion.         General: No edema.  Lymphadenopathy:    He has no cervical adenopathy.  Neurological: He is alert and oriented to person, place, and time. Gait normal.  Skin: Skin is warm and dry. He is not diaphoretic. No erythema.  Psychiatric: Mood, memory, affect and judgment normal.  Nursing note and vitals reviewed.     Lab Results:  CBC    Component Value Date/Time   WBC 5.6 03/16/2018 0842   RBC 4.11 (L) 03/16/2018 0842   HGB 14.5 03/16/2018 0842   HGB 14.3 12/10/2016 1042   HCT 40.7 03/16/2018 0842   HCT 42.1 12/10/2016 1042   PLT 279 03/16/2018 0842   PLT 121 (L) 12/10/2016 1042   MCV 99.0 03/16/2018 0842   MCV 98.9 (H) 12/10/2016 1042   MCH 35.3 (H) 03/16/2018 0842   MCHC 35.6  03/16/2018 0842   RDW 12.3 03/16/2018 0842   RDW 12.6 12/10/2016 1042   LYMPHSABS 0.9 02/05/2018 1735   LYMPHSABS 0.7 (L) 12/10/2016 1042   MONOABS 0.7 02/05/2018 1735   MONOABS 0.4 12/10/2016 1042   EOSABS 0.0 02/05/2018 1735   EOSABS 0.1 12/10/2016 1042   BASOSABS 0.0 02/05/2018 1735   BASOSABS 0.0 12/10/2016 1042    BMET    Component Value Date/Time   NA 137 03/16/2018 0842   NA 137 12/10/2016 1042   K 4.9 03/16/2018 0842   K 4.4 12/10/2016 1042   CL 101 03/16/2018 0842   CO2 29 03/16/2018 0842   CO2 28 12/10/2016 1042   GLUCOSE 100 (H) 03/16/2018 0842   GLUCOSE 96 12/10/2016 1042   BUN 13 03/16/2018 0842   BUN 9.7 12/10/2016 1042   CREATININE 0.86 03/16/2018 0842   CREATININE 0.8 12/10/2016 1042   CALCIUM 9.3 03/16/2018 0842   CALCIUM 8.7 12/10/2016 1042   GFRNONAA 98 03/16/2018 0842   GFRAA 113 03/16/2018 0842    BNP No results found for: BNP  ProBNP No results found for: PROBNP    Assessment & Plan:   GOLD COPD II B Plan: Stop Trelegy Ellipta Start Symbicort 160 and Spiriva Respimat 2.5 Samples provided today Prescription sent to pharmacy Discussed smoking cessation today, patient is unwilling to stop smoking, I recommendations are to stop smoking Follow-up in 2  months Follow-up with the health department regarding the meningitis vaccine  Tobacco abuse Plan: Discussed and encourage patient to stop smoking, patient is not interested in stopping at this time Recommended the patient to stop smoking  HIV (human immunodeficiency virus infection) ([place]) Continue follow-up with infectious disease  MAI (mycobacterium avium-intracellulare) ([place]) Continue follow-up with infectious disease  Healthcare maintenance Follow-up with the health department or infectious disease clinic regarding meningitis vaccine  Lung mass Plan: Continue follow-up with [facility] and infectious disease Continue treatment for MAI Follow-up with our office in 2 months  OSA (obstructive sleep apnea) Plan: Start CPAP therapy Patient declined oral appliance today Patient declined positional therapy Follow-up in 2 months    Return in about 2 months (around 08/31/2018), or if symptoms worsen or fail to improve, for Follow up with [name] FNP-C, Follow up with Dr. Lake Bells.   Lauraine Rinne, NP 07/01/2018   This appointment was 26 minutes long with over 50% of the time in direct face-to-face patient care, assessment, plan of care, and follow-up.

## 2018-07-01 ENCOUNTER — Other Ambulatory Visit: Payer: Self-pay

## 2018-07-01 ENCOUNTER — Encounter: Payer: Self-pay | Admitting: Pulmonary Disease

## 2018-07-01 ENCOUNTER — Ambulatory Visit: Payer: Medicaid Other | Admitting: Pulmonary Disease

## 2018-07-01 VITALS — BP 110/76 | HR 71 | Temp 98.2°F | Wt 141.0 lb

## 2018-07-01 DIAGNOSIS — J439 Emphysema, unspecified: Secondary | ICD-10-CM | POA: Diagnosis not present

## 2018-07-01 DIAGNOSIS — A310 Pulmonary mycobacterial infection: Secondary | ICD-10-CM | POA: Diagnosis not present

## 2018-07-01 DIAGNOSIS — Z0000 Encounter for general adult medical examination without abnormal findings: Secondary | ICD-10-CM

## 2018-07-01 DIAGNOSIS — B20 Human immunodeficiency virus [HIV] disease: Secondary | ICD-10-CM | POA: Diagnosis not present

## 2018-07-01 DIAGNOSIS — Z720 Tobacco use: Secondary | ICD-10-CM | POA: Diagnosis not present

## 2018-07-01 DIAGNOSIS — G4733 Obstructive sleep apnea (adult) (pediatric): Secondary | ICD-10-CM

## 2018-07-01 MED ORDER — SPIRIVA RESPIMAT 2.5 MCG/ACT IN AERS: 2.0000 | INHALATION_SPRAY | Freq: Every day | RESPIRATORY_TRACT | 0 refills | Status: DC

## 2018-07-01 MED ORDER — BUDESONIDE-FORMOTEROL FUMARATE 160-4.5 MCG/ACT IN AERO: 2.0000 | INHALATION_SPRAY | Freq: Every day | RESPIRATORY_TRACT | 0 refills | Status: DC

## 2018-07-01 MED ORDER — BUDESONIDE-FORMOTEROL FUMARATE 160-4.5 MCG/ACT IN AERO: 2.0000 | INHALATION_SPRAY | Freq: Two times a day (BID) | RESPIRATORY_TRACT | 6 refills | Status: DC

## 2018-07-01 MED ORDER — SPIRIVA RESPIMAT 2.5 MCG/ACT IN AERS: 2.0000 | INHALATION_SPRAY | Freq: Every day | RESPIRATORY_TRACT | 6 refills | Status: DC

## 2018-07-01 NOTE — Assessment & Plan Note (Signed)
Plan: Start CPAP therapy Patient declined oral appliance today Patient declined positional therapy Follow-up in 2 months

## 2018-07-01 NOTE — Assessment & Plan Note (Signed)
Plan: Discussed and encourage patient to stop smoking, patient is not interested in stopping at this time Recommended the patient to stop smoking

## 2018-07-01 NOTE — Assessment & Plan Note (Signed)
Continue follow-up with infectious disease 

## 2018-07-01 NOTE — Assessment & Plan Note (Signed)
Plan: Stop Trelegy Ellipta Start Symbicort 160 and Spiriva Respimat 2.5 Samples provided today Prescription sent to pharmacy Discussed smoking cessation today, patient is unwilling to stop smoking, I recommendations are to stop smoking Follow-up in 2 months Follow-up with the health department regarding the meningitis vaccine

## 2018-07-01 NOTE — Assessment & Plan Note (Signed)
Follow-up with the health department or infectious disease clinic regarding meningitis vaccine

## 2018-07-01 NOTE — Patient Instructions (Addendum)
New CPAP start DME: [employer] APAP settings 5-15  Please also try to focus on the position that she sleeping.  You had significantly more events when you were laying supine still laying on your back.  Please try to sleep on your side.  We recommend that you continue using your CPAP daily >>>Keep up the hard work using your device >>> Goal should be wearing this for the entire night that you are sleeping, at least 4 to 6 hours  Remember:  . Do not drive or operate heavy machinery if tired or drowsy.  . Please notify the supply company and office if you are unable to use your device regularly due to missing supplies or machine being broken.  . Work on maintaining a healthy weight and following your recommended nutrition plan  . Maintain proper daily exercise and movement  . Maintaining proper use of your device can also help improve management of other chronic illnesses such as: Blood pressure, blood sugars, and weight management.   BiPAP/ CPAP Cleaning:  >>>Clean weekly, with Dawn soap, and bottle brush.  Set up to air dry.     Trial of Symbicort 160 >>> 2 puffs in the morning right when you wake up, rinse out your mouth after use, 12 hours later 2 puffs, rinse after use >>> Take this daily, no matter what >>> This is not a rescue inhaler   Trial Spiriva Respimat 2.5 >>> 2 puffs daily >>> Do this every day >>>This is not a rescue inhaler  STOP [facility] or Infectious Disease regarding your Meningitis vaccine    Return in about 2 months (around 08/31/2018), or if symptoms worsen or fail to improve.          Coronavirus (COVID-19) Are you at risk?  Are you at risk for the Coronavirus (COVID-19)?  To be considered HIGH RISK for Coronavirus (COVID-19), you have to meet the following criteria:  . Traveled to [place], [place], [place], [place] or [place]; or in the [place] to [place], [place], [place], or [place]; and have fever, cough, and shortness of breath within the last 2 weeks of travel OR . Been in close contact with a person diagnosed with COVID-19 within the last 2 weeks and have fever, cough, and shortness of breath . IF YOU DO NOT MEET THESE CRITERIA, YOU ARE CONSIDERED LOW RISK FOR COVID-19.  What to do if you are HIGH RISK for COVID-19?  Marland Kitchen If you are having a medical emergency, call 911. . Seek medical care right away. Before you go to a doctor's office, urgent care or emergency department, call ahead and tell them about your recent travel, contact with someone diagnosed with COVID-19, and your symptoms. You should receive instructions from your physician's office regarding next steps of care.  . When you arrive at healthcare provider, tell the healthcare staff immediately you have returned from visiting [place], [place], [place], [place] or [place]; or traveled in the [place] to [place], [place], [place], or [place]; in the last two weeks or you have been in close contact with a person diagnosed with COVID-19 in the last 2 weeks.   . Tell the health care staff about your symptoms: fever, cough and shortness of breath. . After you have been seen by a medical provider, you will be either: o Tested for (COVID-19) and discharged home on quarantine except to seek medical care if symptoms worsen,  and asked to  - Stay home and avoid contact with others until you get your results (4-5 days)  - Avoid travel on public transportation if possible (such as bus, train, or airplane) or o Sent to the Emergency Department by EMS for evaluation, COVID-19 testing, and possible admission depending on your condition and test results.  What to do if you are LOW RISK for COVID-19?  Reduce your risk of any infection by using the same precautions used for avoiding the common cold or flu:  Marland Kitchen Wash your hands often with soap and warm water for at least 20 seconds.  If soap and water are not readily  available, use an alcohol-based hand sanitizer with at least 60% alcohol.  . If coughing or sneezing, cover your mouth and nose by coughing or sneezing into the elbow areas of your shirt or coat, into a tissue or into your sleeve (not your hands). . Avoid shaking hands with others and consider head nods or verbal greetings only. . Avoid touching your eyes, nose, or mouth with unwashed hands.  . Avoid close contact with people who are sick. . Avoid places or events with large numbers of people in one location, like concerts or sporting events. . Carefully consider travel plans you have or are making. . If you are planning any travel outside or inside the [place], visit the CDC's Travelers' Health webpage for the latest health notices. . If you have some symptoms but not all symptoms, continue to monitor at home and seek medical attention if your symptoms worsen. . If you are having a medical emergency, call 911.   [place] / e-Visit: [email or web address]         [facility]: [facility]: [phone]                   [facility]: [phone]           It is flu season:   >>> Best ways to protect herself from the flu: Receive the yearly flu vaccine, practice good hand hygiene washing with soap and also using hand sanitizer when available, eat a nutritious meals, get adequate rest, hydrate appropriately   Please contact the office if your symptoms worsen or you have concerns that you are not improving.   Thank you for choosing [facility] for your healthcare, and for allowing [place] to partner with you on your healthcare journey. I am thankful to be able to provide care to you today.   [name] FNP-C      Living With Sleep Apnea Sleep apnea is a condition in which breathing pauses or becomes shallow during sleep. Sleep  apnea is most commonly caused by a collapsed or blocked airway. People with sleep apnea snore loudly and have times when they gasp and stop breathing for 10 seconds or more during sleep. This happens over and over during the night. This disrupts your sleep and keeps your body from getting the rest that it needs, which can cause tiredness and lack of energy (fatigue) during the day. The breaks in breathing also interrupt the deep sleep that you need to feel rested. Even if you do not completely wake up from the gaps in breathing, your sleep may not be restful. You may also have a headache in the morning and low energy during the day, and you may feel anxious or depressed. How can sleep apnea affect  me? Sleep apnea increases your chances of extreme tiredness during the day (daytime fatigue). It can also increase your risk for health conditions, such as:  Heart attack.  Stroke.  Diabetes.  Heart failure.  Irregular heartbeat.  High blood pressure. If you have daytime fatigue as a result of sleep apnea, you may be more likely to:  Perform poorly at school or work.  Fall asleep while driving.  Have difficulty with attention.  Develop depression or anxiety.  Become severely overweight (obese).  Have sexual dysfunction. What actions can I take to manage sleep apnea? Sleep apnea treatment   If you were given a device to open your airway while you sleep, use it only as told by your health care provider. You may be given: ? An oral appliance. This is a custom-made mouthpiece that shifts your lower jaw forward. ? A continuous positive airway pressure (CPAP) device. This device blows air through a mask when you breathe out (exhale). ? A nasal expiratory positive airway pressure (EPAP) device. This device has valves that you put into each nostril. ? A bi-level positive airway pressure (BPAP) device. This device blows air through a mask when you breathe in (inhale) and breathe out  (exhale).  You may need surgery if other treatments do not work for you. Sleep habits  Go to sleep and wake up at the same time every day. This helps set your internal clock (circadian rhythm) for sleeping. ? If you stay up later than usual, such as on weekends, try to get up in the morning within 2 hours of your normal wake time.  Try to get at least 7-9 hours of sleep each night.  Stop computer, tablet, and mobile phone use a few hours before bedtime.  Do not take long naps during the day. If you nap, limit it to 30 minutes.  Have a relaxing bedtime routine. Reading or listening to music may relax you and help you sleep.  Use your bedroom only for sleep. ? Keep your television and computer out of your bedroom. ? Keep your bedroom cool, dark, and quiet. ? Use a supportive mattress and pillows.  Follow your health care provider's instructions for other changes to sleep habits. Nutrition  Do not eat heavy meals in the evening.  Do not have caffeine in the later part of the day. The effects of caffeine can last for more than 5 hours.  Follow your health care provider's or dietitian's instructions for any diet changes. Lifestyle      Do not drink alcohol before bedtime. Alcohol can cause you to fall asleep at first, but then it can cause you to wake up in the middle of the night and have trouble getting back to sleep.  Do not use any products that contain nicotine or tobacco, such as cigarettes and e-cigarettes. If you need help quitting, ask your health care provider. Medicines  Take over-the-counter and prescription medicines only as told by your health care provider.  Do not use over-the-counter sleep medicine. You can become dependent on this medicine, and it can make sleep apnea worse.  Do not use medicines, such as sedatives and narcotics, unless told by your health care provider. Activity  Exercise on most days, but avoid exercising in the evening. Exercising near  bedtime can interfere with sleeping.  If possible, spend time outside every day. Natural light helps regulate your circadian rhythm. General information  Lose weight if you need to, and maintain a healthy weight.  Keep all  follow-up visits as told by your health care provider. This is important.  If you are having surgery, make sure to tell your health care provider that you have sleep apnea. You may need to bring your device with you. Where to find more information Learn more about sleep apnea and daytime fatigue from:  American Sleep Association: sleepassociation.[place]: sleepfoundation.org  National Heart, Lung, and Blood Institute: https://www.hartman-hill.biz/ Summary  Sleep apnea can cause daytime fatigue and other serious health conditions.  Both sleep apnea and daytime fatigue can be bad for your health and well-being.  You may need to wear a device while sleeping to help keep your airway open.  If you are having surgery, make sure to tell your health care provider that you have sleep apnea. You may need to bring your device with you.  Making changes to sleep habits, diet, lifestyle, and activity can help you manage sleep apnea. This information is not intended to replace advice given to you by your health care provider. Make sure you discuss any questions you have with your health care provider. Document Released: 03/20/2017 Document Revised: 08/26/2017 Document Reviewed: 03/20/2017 Elsevier Interactive Patient Education  2019 [place].     CPAP and BPAP Information CPAP and BPAP are methods of helping a person breathe with the use of air pressure. CPAP stands for "continuous positive airway pressure." BPAP stands for "bi-level positive airway pressure." In both methods, air is blown through your nose or mouth and into your air passages to help you breathe well. CPAP and BPAP use different amounts of pressure to blow air. With CPAP, the amount of pressure  stays the same while you breathe in and out. With BPAP, the amount of pressure is increased when you breathe in (inhale) so that you can take larger breaths. Your health care provider will recommend whether CPAP or BPAP would be more helpful for you. Why are CPAP and BPAP treatments used? CPAP or BPAP can be helpful if you have:  Sleep apnea.  Chronic obstructive pulmonary disease (COPD).  Heart failure.  Medical conditions that weaken the muscles of the chest including muscular dystrophy, or neurological diseases such as amyotrophic lateral sclerosis (ALS).  Other problems that cause breathing to be weak, abnormal, or difficult. CPAP is most commonly used for obstructive sleep apnea (OSA) to keep the airways from collapsing when the muscles relax during sleep. How is CPAP or BPAP administered? Both CPAP and BPAP are provided by a small machine with a flexible plastic tube that attaches to a plastic mask. You wear the mask. Air is blown through the mask into your nose or mouth. The amount of pressure that is used to blow the air can be adjusted on the machine. Your health care provider will determine the pressure setting that should be used based on your individual needs. When should CPAP or BPAP be used? In most cases, the mask only needs to be worn during sleep. Generally, the mask needs to be worn throughout the night and during any daytime naps. People with certain medical conditions may also need to wear the mask at other times when they are awake. Follow instructions from your health care provider about when to use the machine. What are some tips for using the mask?   Because the mask needs to be snug, some people feel trapped or closed-in (claustrophobic) when first using the mask. If you feel this way, you may need to get used to the mask. One way  to do this is by holding the mask loosely over your nose or mouth and then gradually applying the mask more snugly. You can also gradually  increase the amount of time that you use the mask.  Masks are available in various types and sizes. Some fit over your mouth and nose while others fit over just your nose. If your mask does not fit well, talk with your health care provider about getting a different one.  If you are using a mask that fits over your nose and you tend to breathe through your mouth, a chin strap may be applied to help keep your mouth closed.  The CPAP and BPAP machines have alarms that may sound if the mask comes off or develops a leak.  If you have trouble with the mask, it is very important that you talk with your health care provider about finding a way to make the mask easier to tolerate. Do not stop using the mask. Stopping the use of the mask could have a negative impact on your health. What are some tips for using the machine?  Place your CPAP or BPAP machine on a secure table or stand near an electrical outlet.  Know where the on/off switch is located on the machine.  Follow instructions from your health care provider about how to set the pressure on your machine and when you should use it.  Do not eat or drink while the CPAP or BPAP machine is on. Food or fluids could get pushed into your lungs by the pressure of the CPAP or BPAP.  Do not smoke. Tobacco smoke residue can damage the machine.  For home use, CPAP and BPAP machines can be rented or purchased through home health care companies. Many different brands of machines are available. Renting a machine before purchasing may help you find out which particular machine works well for you.  Keep the CPAP or BPAP machine and attachments clean. Ask your health care provider for specific instructions. Get help right away if:  You have redness or open areas around your nose or mouth where the mask fits.  You have trouble using the CPAP or BPAP machine.  You cannot tolerate wearing the CPAP or BPAP mask.  You have pain, discomfort, and bloating in your  abdomen. Summary  CPAP and BPAP are methods of helping a person breathe with the use of air pressure.  Both CPAP and BPAP are provided by a small machine with a flexible plastic tube that attaches to a plastic mask.  If you have trouble with the mask, it is very important that you talk with your health care provider about finding a way to make the mask easier to tolerate. This information is not intended to replace advice given to you by your health care provider. Make sure you discuss any questions you have with your health care provider. Document Released: 09/22/2003 Document Revised: 08/26/2017 Document Reviewed: 11/13/2015 Elsevier Interactive Patient Education  2019 [place] Risks of Smoking Smoking cigarettes is very bad for your health. Tobacco smoke has over 200 known poisons in it. It contains the poisonous gases nitrogen oxide and carbon monoxide. There are over 60 chemicals in tobacco smoke that cause cancer. Smoking is difficult to quit because a chemical in tobacco, called nicotine, causes addiction or dependence. When you smoke and inhale, nicotine is absorbed rapidly into the bloodstream through your lungs. Both inhaled and non-inhaled nicotine may be addictive. What are the risks of  cigarette smoke? Cigarette smokers have an increased risk of many serious medical problems, including:  Lung cancer.  Lung disease, such as pneumonia, bronchitis, and emphysema.  Chest pain (angina) and heart attack because the heart is not getting enough oxygen.  Heart disease and peripheral blood vessel disease.  High blood pressure (hypertension).  Stroke.  Oral cancer, including cancer of the lip, mouth, or voice box.  Bladder cancer.  Pancreatic cancer.  Cervical cancer.  Pregnancy complications, including premature birth.  Stillbirths and smaller newborn babies, birth defects, and genetic damage to sperm.  Early menopause.  Lower estrogen level for  women.  Infertility.  Facial wrinkles.  Blindness.  Increased risk of broken bones (fractures).  Senile dementia.  Stomach ulcers and internal bleeding.  Delayed wound healing and increased risk of complications during surgery.  Even smoking lightly shortens your life expectancy by several years. Because of secondhand smoke exposure, children of smokers have an increased risk of the following:  Sudden infant death syndrome (SIDS).  Respiratory infections.  Lung cancer.  Heart disease.  Ear infections. What are the benefits of quitting? There are many health benefits of quitting smoking. Here are some of them:  Within days of quitting smoking, your risk of having a heart attack decreases, your blood flow improves, and your lung capacity improves. Blood pressure, pulse rate, and breathing patterns start returning to normal soon after quitting.  Within months, your lungs may clear up completely.  Quitting for 10 years reduces your risk of developing lung cancer and heart disease to almost that of a nonsmoker.  People who quit may see an improvement in their overall quality of life. How do I quit smoking?     Smoking is an addiction with both physical and psychological effects, and longtime habits can be hard to change. Your health care provider can recommend:  Programs and community resources, which may include group support, education, or talk therapy.  Prescription medicines to help reduce cravings.  Nicotine replacement products, such as patches, gum, and nasal sprays. Use these products only as directed. Do not replace cigarette smoking with electronic cigarettes, which are commonly called e-cigarettes. The safety of e-cigarettes is not known, and some may contain harmful chemicals.  A combination of two or more of these methods. Where to find more information  American Lung Association: www.lung.org  American Cancer Society: www.cancer.org Summary  Smoking  cigarettes is very bad for your health. Cigarette smokers have an increased risk of many serious medical problems, including several cancers, heart disease, and stroke.  Smoking is an addiction with both physical and psychological effects, and longtime habits can be hard to change.  By stopping right away, you can greatly reduce the risk of medical problems for you and your family.  To help you quit smoking, your health care provider can recommend programs, community resources, prescription medicines, and nicotine replacement products such as patches, gum, and nasal sprays. This information is not intended to replace advice given to you by your health care provider. Make sure you discuss any questions you have with your health care provider. Document Released: 02/01/2004 Document Revised: 03/27/2017 Document Reviewed: 12/29/2015 Elsevier Interactive Patient Education  2019 [employer].

## 2018-07-01 NOTE — Assessment & Plan Note (Signed)
Plan: Continue follow-up with CVTS and infectious disease Continue treatment for MAI Follow-up with our office in 2 months

## 2018-07-01 NOTE — Progress Notes (Signed)
Patient seen in the office today and instructed on use of spiriva respimat and symbicort inhalers.  Patient expressed understanding and demonstrated technique.

## 2018-07-03 DIAGNOSIS — Z23 Encounter for immunization: Secondary | ICD-10-CM | POA: Diagnosis not present

## 2018-07-04 NOTE — Progress Notes (Signed)
Reviewed, agree 

## 2018-07-22 DIAGNOSIS — G894 Chronic pain syndrome: Secondary | ICD-10-CM | POA: Diagnosis not present

## 2018-07-22 DIAGNOSIS — D124 Benign neoplasm of descending colon: Secondary | ICD-10-CM | POA: Diagnosis not present

## 2018-07-22 DIAGNOSIS — M25511 Pain in right shoulder: Secondary | ICD-10-CM | POA: Diagnosis not present

## 2018-07-22 DIAGNOSIS — B20 Human immunodeficiency virus [HIV] disease: Secondary | ICD-10-CM | POA: Diagnosis not present

## 2018-07-22 DIAGNOSIS — Z79891 Long term (current) use of opiate analgesic: Secondary | ICD-10-CM | POA: Diagnosis not present

## 2018-07-22 DIAGNOSIS — K621 Rectal polyp: Secondary | ICD-10-CM | POA: Diagnosis not present

## 2018-07-22 DIAGNOSIS — M25512 Pain in left shoulder: Secondary | ICD-10-CM | POA: Diagnosis not present

## 2018-07-22 DIAGNOSIS — Z1211 Encounter for screening for malignant neoplasm of colon: Secondary | ICD-10-CM | POA: Diagnosis not present

## 2018-07-22 DIAGNOSIS — M542 Cervicalgia: Secondary | ICD-10-CM | POA: Diagnosis not present

## 2018-07-22 DIAGNOSIS — M545 Low back pain: Secondary | ICD-10-CM | POA: Diagnosis not present

## 2018-07-23 DIAGNOSIS — G4733 Obstructive sleep apnea (adult) (pediatric): Secondary | ICD-10-CM | POA: Diagnosis not present

## 2018-07-23 DIAGNOSIS — J449 Chronic obstructive pulmonary disease, unspecified: Secondary | ICD-10-CM | POA: Diagnosis not present

## 2018-07-26 ENCOUNTER — Other Ambulatory Visit: Payer: Self-pay | Admitting: Thoracic Surgery (Cardiothoracic Vascular Surgery)

## 2018-07-26 DIAGNOSIS — R911 Solitary pulmonary nodule: Secondary | ICD-10-CM

## 2018-07-29 ENCOUNTER — Encounter: Payer: Self-pay | Admitting: Infectious Diseases

## 2018-08-08 DIAGNOSIS — G4733 Obstructive sleep apnea (adult) (pediatric): Secondary | ICD-10-CM | POA: Diagnosis not present

## 2018-08-08 DIAGNOSIS — J449 Chronic obstructive pulmonary disease, unspecified: Secondary | ICD-10-CM | POA: Diagnosis not present

## 2018-08-13 ENCOUNTER — Other Ambulatory Visit: Payer: Medicaid Other

## 2018-08-18 ENCOUNTER — Ambulatory Visit: Payer: Medicaid Other | Admitting: Thoracic Surgery (Cardiothoracic Vascular Surgery)

## 2018-08-18 ENCOUNTER — Telehealth: Payer: Self-pay | Admitting: Pulmonary Disease

## 2018-08-18 NOTE — Telephone Encounter (Signed)
08/18/2018 [id number]  Langley Gauss,  Can we please contact the patient and see how he is doing.  He should have been established with CPAP therapy has he received this?  And if so how long has he been using this?  We need to get the patient scheduled with another provider as Dr. Lake Bells is no longer working in the clinic as he is primarily working all in the hospital at [facility].    Pharmacy team,  We will also send this information to the pharmacy team to follow-up with the patient as it looks like were struggling to get his inhalers covered.  Patient has been tried on [employer] as well as Anoro Ellipta.  He was most recently seen in June/2020 and placed on Symbicort 160 and Spiriva Respimat 2.5.  It looks like his insurance is denying the coverage of the Spiriva Respimat 2.5.  Insurance would prefer for him to be on Spiriva HandiHaler or Stiolto Respimat.  Can we look into seeing what ICS options are preferred for the patient?  Or should we just go ahead and place him on Spiriva HandiHaler.  Patient on multiple high-risk medications and also would likely benefit from an in person office visit with pharmacy maybe this can be coordinated when he establishes care with another provider here.   [name] FNP

## 2018-08-18 NOTE — Telephone Encounter (Signed)
Contacted patient by phone regarding B. Warner Mccreedy, NP recommendations.  Patient states he is using cpap approx 3 weeks now and 'loving it.' Informed of need to change MD providers and patient is agreeable.  Would like to discuss with B. Mack at next visit.  Also pt was pleased at pharmacy reviewing medications for cost saving measures.  Patient plans to keep follow up cpap appt end of August.  Nothing further needed today.

## 2018-08-19 DIAGNOSIS — Z79891 Long term (current) use of opiate analgesic: Secondary | ICD-10-CM | POA: Diagnosis not present

## 2018-08-19 DIAGNOSIS — B20 Human immunodeficiency virus [HIV] disease: Secondary | ICD-10-CM | POA: Diagnosis not present

## 2018-08-19 DIAGNOSIS — M542 Cervicalgia: Secondary | ICD-10-CM | POA: Diagnosis not present

## 2018-08-19 DIAGNOSIS — M25511 Pain in right shoulder: Secondary | ICD-10-CM | POA: Diagnosis not present

## 2018-08-19 DIAGNOSIS — M25512 Pain in left shoulder: Secondary | ICD-10-CM | POA: Diagnosis not present

## 2018-08-19 DIAGNOSIS — G894 Chronic pain syndrome: Secondary | ICD-10-CM | POA: Diagnosis not present

## 2018-08-19 DIAGNOSIS — M545 Low back pain: Secondary | ICD-10-CM | POA: Diagnosis not present

## 2018-08-23 DIAGNOSIS — J449 Chronic obstructive pulmonary disease, unspecified: Secondary | ICD-10-CM | POA: Diagnosis not present

## 2018-08-23 DIAGNOSIS — G4733 Obstructive sleep apnea (adult) (pediatric): Secondary | ICD-10-CM | POA: Diagnosis not present

## 2018-08-24 ENCOUNTER — Ambulatory Visit
Admission: RE | Admit: 2018-08-24 | Discharge: 2018-08-24 | Disposition: A | Discharge status: Home / Self Care | Payer: Medicaid Other | Source: Ambulatory Visit | Attending: Thoracic Surgery (Cardiothoracic Vascular Surgery) | Admitting: Thoracic Surgery (Cardiothoracic Vascular Surgery)

## 2018-08-24 DIAGNOSIS — J439 Emphysema, unspecified: Secondary | ICD-10-CM | POA: Diagnosis not present

## 2018-08-24 DIAGNOSIS — R911 Solitary pulmonary nodule: Secondary | ICD-10-CM

## 2018-08-24 DIAGNOSIS — J479 Bronchiectasis, uncomplicated: Secondary | ICD-10-CM | POA: Diagnosis not present

## 2018-08-26 ENCOUNTER — Telehealth: Payer: Self-pay

## 2018-08-26 NOTE — Telephone Encounter (Signed)
Called patient to schedule appointment. Stated he was not available until September. Available 09/09/2018 at 9am.

## 2018-09-01 NOTE — Telephone Encounter (Signed)
Patient is scheduled to see me on 09/02/2018 at 9 AM.  May be a good opportunity to try to combine visits if pharmacy team is available to review patient's medications with him as well as help review high financial cost of inhalers.  [name], FNP

## 2018-09-01 NOTE — Progress Notes (Signed)
_0  ID: Calvin Schroeder, male    DOB: Apr 30, 1960, 58 y.o.   MRN: [id number]  Chief Complaint  Patient presents with  . Follow-up    MAI, COPD, recent CT, still smoking    Referring provider: Elinor Parkinson  HPI:  58 year old male current smoker referred in March/2019 for lung mass and emphysema.  Lung mass was biopsied 02/07/2017 by Dr. 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  Patient was also diagnosed with HIV in 2019. 03/2017 he was not tolerating MAI treatment due to GI side effects.  Hope to restart treatment when CD4 counts are above 200.  PMH: HIV positive (followed by Dr. [name] with infectious disease) Smoker/ Smoking History: Current smoker. 40 pack year. 1 ppd.  Maintenance: Symbicort 160, Spiriva Rep 2.5, Ethambutol, Azithromycin Pt of: Dr. Lake Bells  09/02/2018  - Visit   58 year old male current everyday smoker (smoking 1 pack/day) presenting to our office today as a follow-up visit.  Patient reports that he has not used his maintenance inhalers in over the last week.  He is seen no changes in his shortness of breath.  He has had to use his rescue inhaler 1 time over the last week.  He continues to use his DuoNeb nebulized medication 1 time daily in the morning when he wakes up.  He is still smoking 1 pack/day and is not currently interested in stopping smoking at this time.  Patient recently completed a CT as ordered by Dr. [name] who is been following patient regarding his lung mass.  Those CT results are listed below:  08/24/2018-CT chest without contrast-extensive underlying emphysematous change, mild bronchiectasis, nodular lesions with dominant cavitary lesion in the left upper lobe remain, slightly more thickening in the periphery of the left upper lobe nodular lesion and to a lesser extent slight increase in thickening medially, no new cavitation evident, slow-growing neoplasm must be of concern, this area may warrant  bronchoscopy and/or tissue sampling to further evaluate, at a minimum a follow-up CT in approximately 3 months advised to further evaluate, other nodular areas in the right upper lobe show marginal increase in size compared to most recent study, scattered subcentimeter lymph nodes without adenopathy by size criteria, occasional foci of aortic arthrosclerosis, Left upper lobe cavitaryLesion 8.5 x 5.1 cm, slightly enlarged.  Patient is also followed by infectious disease for management of MAI.  Patient was intolerant unable to tolerate right Vantin.  Patient continues to be maintained on azithromycin as well as ethambutol.  Patient has planned follow-up with Dr. [name] in December/2020.  Patient also continues to use a CPAP at home for mild obstructive sleep apnea.  CPAP compliance report today shows excellent compliance.  See 7 compliance report listed below:  08/02/2018-08/31/2018 20-29 out of last 30 days use, 20 those days greater than 4 hours, average usage 6 hours and 20 minutes, APAP setting 5-15, AHI 1.6  Patient is due for flu vaccine and would like to receive it today.   Tests:   06/09/2017-CD4-260  09/09/2017-chest x-ray- eczema redemonstration of chronic cavitation in left upper lobe with coarsened interstitial markings and fibrosis favored to represent tuberculosis infection though difficult to entirely exclude tuberculosis  04/22/2017-CT chest with contrast- similar appearance of left upper lobe cavitary lung mass, findings are favored to be related to an ongoing atypical Mycobacterium infectious process, new right middle lobe infectious nodularity mild right hilar adenopathy, 4 mm right upper lobe pulmonary nodule is new  >>>consider noncontrast CT in  12 months  February 2020 CT chest images showing decreased size of the thick-walled cavitary lesion in the left upper lobe, several other scattered nodules some of which are cavitary, also stable to decreased in size, centrilobular emphysema  noted  08/24/2018-CT chest without contrast-extensive underlying emphysematous change, mild bronchiectasis, nodular lesions with dominant cavitary lesion in the left upper lobe remain, slightly more thickening in the periphery of the left upper lobe nodular lesion and to a lesser extent slight increase in thickening medially, no new cavitation evident, slow-growing neoplasm must be of concern, this area may warrant bronchoscopy and/or tissue sampling to further evaluate, at a minimum a follow-up CT in approximately 3 months advised to further evaluate, other nodular areas in the right upper lobe show marginal increase in size compared to most recent study, scattered subcentimeter lymph nodes without adenopathy by size criteria, occasional foci of aortic arthrosclerosis, Left upper lobe cavitaryLesion 8.5 x 5.1 cm, slightly enlarged.  06/11/2017- spirometry-FVC 3.63 (73% predicted), ratio 65, FEV1 - 61 >>>moderate airflow obstruction  06/08/2018 - split night sleep study- Mild obstructive sleep apnea occurred during this study (AHI = 5.2/h, RDI 13.7/h)   FENO:  No results found for: NITRICOXIDE  PFT: PFT Results Latest Ref Rng & Units 06/11/2017  FVC-Pre L 3.63  FVC-Predicted Pre % 73  Pre FEV1/FVC % % 65  FEV1-Pre L 2.34  FEV1-Predicted Pre % 61    Imaging: Ct Chest Wo Contrast  Result Date: 08/24/2018 CLINICAL DATA:  History of HIV infection with Kaposi sarcoma. Previous cavitary lesion EXAM: CT CHEST WITHOUT CONTRAST TECHNIQUE: Multidetector CT imaging of the chest was performed following the standard protocol without IV contrast. COMPARISON:  Chest CT February 09, 2018 FINDINGS: Cardiovascular: There is no demonstrable thoracic aortic aneurysm. The visualized great vessels appear unremarkable on this noncontrast enhanced study. Note that the right innominate and left common carotid arteries arise immediately adjacent to each other. There is no pericardial effusion or pericardial thickening. There  are scattered foci of coronary artery calcification. Mediastinum/Nodes: Thyroid appears unremarkable. There are occasional subcentimeter lymph nodes. There is no adenopathy by size criteria in the thoracic region. No esophageal lesions are demonstrable. Lungs/Pleura: The cavitary region in the posterior segment left upper lobe toward the apex measures 8.5 x 5.1 cm, slightly larger than on the previous study. In comparison with the previous study, there is increase in soft tissue thickening along the superolateral aspect of this lesion. The area of cavitation appears essentially stable. The thickening in this area is irregular although overall somewhat similar in contour compared to the previous study. An area of soft tissue thickening along the posterior aspect of this lesion which currently measured 1.8 x 1.7 cm currently measures 2.0 x 1.6 cm. Soft tissue thickening more medially along this lesion currently measures 2.4 x 1.4 cm compared to a measurement on the previous study of 1.4 x 1.1 cm. There is an irregular nodular opacity in the anterior segment of the right upper lobe measuring 1.7 x 1.1 cm, compared to a measured value of 1.5 x 0.8 cm on prior study. This lesion is currently best seen on axial slice 69 series 8. A nodular opacity in the posterior segment left upper lobe currently measures 1.3 x 1.0 cm compared to a prior measurement of 1.0 x 0.8 cm. Scattered areas of scarring elsewhere appear stable. There is no frank consolidation. There are stable nodular opacities in the inferior lingula, largest measuring 6 mm. No pleural effusions are evident. Upper Abdomen: Visualized upper  abdominal structures appear unremarkable on this noncontrast enhanced study except for mild upper abdominal aortic atherosclerosis. Musculoskeletal: No blastic or lytic bone lesions. No intramuscular lesions evident. IMPRESSION: 1.  Extensive underlying emphysematous change.  Mild bronchiectasis. 2. Nodular lesions with  dominant cavitary lesion in the left upper lobe remain. Slightly more thickening in the periphery of the left upper lobe nodular lesion and to a lesser extent slight increase in thickening medially. No new cavitation evident. A slow growing neoplasm must be of concern. This area may warrant bronchoscopy and/or tissue sampling to further evaluate. At a minimum, a follow-up CT in approximately 3 months advised to further evaluate. Other nodular areas in the right upper lobe show marginal increase in size compared to most recent study. No entirely new lesions evident. Subcentimeter nodular lesions in the inferior lingula appear stable. 3. Scattered subcentimeter lymph nodes without adenopathy by size criteria. 4. Occasional foci of aortic atherosclerosis. Foci of coronary artery calcification evident. Aortic Atherosclerosis (ICD10-I70.0) and Emphysema (ICD10-J43.9). Electronically Signed   By: Lowella Grip III M.D.   On: 08/24/2018 08:36      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 though difficult to entirely exclude tuberculosis  04/22/2017-CT chest with contrast- similar appearance of left upper lobe cavitary lung mass, findings are favored to be related to an ongoing atypical Mycobacterium infectious process, new right middle lobe infectious nodularity mild right hilar adenopathy, 4 mm right upper lobe pulmonary nodule is new consider noncontrast CT in 12 months  February 2020 CT chest images showing decreased size of the thick-walled cavitary lesion in the left upper lobe, several other scattered nodules some of which are cavitary, also stable to decreased in size, centrilobular emphysema noted  08/24/2018-CT chest without contrast-extensive underlying emphysematous change, mild bronchiectasis, nodular  lesions with dominant cavitary lesion in the left upper lobe remain, slightly more thickening in the periphery of the left upper lobe nodular lesion and to a lesser extent slight increase in thickening medially, no new cavitation evident, slow-growing neoplasm must be of concern, this area may warrant bronchoscopy and/or tissue sampling to further evaluate, at a minimum a follow-up CT in approximately 3 months advised to further evaluate, other nodular areas in the right upper lobe show marginal increase in size compared to most recent study, scattered subcentimeter lymph nodes without adenopathy by size criteria, occasional foci of aortic arthrosclerosis, Left upper lobe cavitaryLesion 8.5 x 5.1 cm, slightly enlarged.       GOLD COPD II B    06/11/2017- spirometry-FVC 3.63 (73% predicted), ratio 65, FEV1 - 61 >>>moderate airflow obstruction       OSA (obstructive sleep apnea)     06/08/2018 - split night sleep study- Mild obstructive sleep apnea occurred during this study (AHI =  5.2/h, RDI 13.7/h)   RECOMMENDATIONS  - Positional therapy avoiding supine position during sleep.  - Very mild obstructive sleep apnea. The cardiovascular  significance of mild OSA is minimal. Treatment options include  no treatment since his ESS is 0 or oral appliance / CPAP therapy  if very symptomatic         No Active Allergies  Immunization History  Administered Date(s) Administered  . Hepatitis A, Adult 04/09/2017, 07/28/2017  . Influenza,inj,Quad PF,6+ Mos 04/09/2017, 10/21/2017, 09/02/2018  . Pneumococcal Conjugate-13 06/09/2017  . Pneumococcal Polysaccharide-23 04/09/2017  .  Pneumococcal-Unspecified 04/09/2017   Flu vaccine today   Past Medical History:  Diagnosis Date  . Back pain   . COPD (chronic obstructive pulmonary disease) (HCC)    emphysema   . Dyspnea    with exertion   . HIV (human immunodeficiency virus infection) ([place])   . 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 Pneumonia     11/2016  . Tobacco abuse 06/10/2013    Tobacco History: Social History   Tobacco Use  Smoking Status Current Every Day Smoker  . Packs/day: 1.00  . Years: 40.00  . Pack years: 40.00  Smokeless Tobacco Never Used  Tobacco Comment   1 pack per day currently, quit date set for 12/23/17 per pt    Ready to quit: No Counseling given: Yes Comment: 1 pack per day currently, quit date set for 12/23/17 per pt    Smoking assessment and cessation counseling  Patient currently smoking: 1 ppd I have advised the patient to quit/stop smoking as soon as possible due to high risk for multiple medical problems.  It will also be very difficult for [place] to manage patient's  respiratory symptoms and status if we continue to expose her lungs to a known irritant.  We do not advise e-cigarettes as a form of stopping smoking.  Patient is not willing to quit smoking.  I have advised the patient that we can assist and have options of nicotine replacement therapy, provided smoking cessation education today, provided smoking cessation counseling, and provided cessation resources.  Follow-up next office visit office visit for assessment of smoking cessation.    Smoking cessation counseling advised for: 4 min    Outpatient Encounter Medications as of 09/02/2018  Medication Sig  . azithromycin (ZITHROMAX) 600 MG tablet Take 1 tablet (600 mg total) by mouth daily.  . budesonide-formoterol (SYMBICORT) 160-4.5 MCG/ACT inhaler Inhale 2 puffs into the lungs 2 (two) times a day.  . diphenoxylate-atropine (LOMOTIL) 2.5-0.025 MG tablet TAKE 1 BY MOUTH FOUR TIMES DAILY AS NEEDED FOR DIARRHEA  . dolutegravir (TIVICAY) 50 MG tablet Take 1 tablet (50 mg total) by mouth daily.  Marland Kitchen emtricitabine-tenofovir AF (DESCOVY) 200-25 MG tablet Take 1 tablet by mouth daily.  Marland Kitchen ethambutol (MYAMBUTOL) 400 MG tablet   . ipratropium-albuterol (DUONEB) 0.5-2.5 (3) MG/3ML SOLN Take 3 mLs by nebulization every 6 (six) hours as needed.  .  Tiotropium Bromide Monohydrate (SPIRIVA RESPIMAT) 2.5 MCG/ACT AERS Inhale 2 puffs into the lungs daily.  . [DISCONTINUED] budesonide-formoterol (SYMBICORT) 160-4.5 MCG/ACT inhaler Inhale 2 puffs into the lungs daily.  . [DISCONTINUED] Tiotropium Bromide Monohydrate (SPIRIVA RESPIMAT) 2.5 MCG/ACT AERS Inhale 2 puffs into the lungs daily.   No facility-administered encounter medications on file as of 09/02/2018.      Review of Systems  Review of Systems  Constitutional: Negative for activity change, chills, fatigue, fever and unexpected weight change.  HENT: Negative for postnasal drip, rhinorrhea, sinus pressure, sinus pain and sore throat.   Eyes: Negative.   Respiratory: Negative for cough, shortness of breath and wheezing.   Cardiovascular: Negative for chest pain and palpitations.  Gastrointestinal: Negative for constipation, diarrhea, nausea and vomiting.  Endocrine: Negative.   Genitourinary: Negative.   Musculoskeletal: Negative.   Skin: Negative.   Neurological: Negative for dizziness and headaches.  Psychiatric/Behavioral: Negative for dysphoric mood. The patient is nervous/anxious.   All other systems reviewed and are negative.    Physical Exam  BP 116/80   Pulse 65   Temp 98 F (36.7 C) (Oral)  Ht _0  (1.778 m)   Wt 141 lb 6.4 oz (64.1 kg)   SpO2 98%   BMI 20.29 kg/m   Wt Readings from Last 5 Encounters:  09/02/18 141 lb 6.4 oz (64.1 kg)  07/01/18 141 lb (64 kg)  06/08/18 150 lb (68 kg)  03/30/18 136 lb (61.7 kg)  03/12/18 137 lb (62.1 kg)     Physical Exam Vitals signs and nursing note reviewed.  Constitutional:      General: He is not in acute distress.    Appearance: He is normal weight.     Comments: Thin chronically ill male  HENT:     Head: Normocephalic and atraumatic.     Right Ear: Hearing, tympanic membrane, ear canal and external ear normal.     Left Ear: Hearing, tympanic membrane, ear canal and external ear normal.     Nose: No  mucosal edema.     Right Turbinates: Not enlarged.     Left Turbinates: Not enlarged.     Comments: Respirator applied unable to evaluate    Mouth/Throat:     Comments: Respirator applied unable to evaluate Eyes:     Pupils: Pupils are equal, round, and reactive to light.  Neck:     Musculoskeletal: Normal range of motion.  Cardiovascular:     Rate and Rhythm: Normal rate and regular rhythm.     Pulses: Normal pulses.     Heart sounds: Normal heart sounds. No murmur.  Pulmonary:     Effort: Pulmonary effort is normal. No respiratory distress.     Breath sounds: Normal breath sounds. No decreased breath sounds, wheezing or rales.     Comments: Diminished breathsounds throughout exam Abdominal:     General: Bowel sounds are normal. There is no distension.     Palpations: Abdomen is soft.     Tenderness: There is no abdominal tenderness.  Musculoskeletal:     Right lower leg: No edema.     Left lower leg: No edema.  Lymphadenopathy:     Cervical: No cervical adenopathy.  Skin:    General: Skin is warm and dry.     Capillary Refill: Capillary refill takes less than 2 seconds.     Findings: No erythema or rash.  Neurological:     General: No focal deficit present.     Mental Status: He is alert and oriented to person, place, and time.     Motor: No weakness.     Coordination: Coordination normal.     Gait: Gait is intact. Gait normal.  Psychiatric:        Mood and Affect: Mood normal.        Behavior: Behavior normal. Behavior is cooperative.        Thought Content: Thought content normal.        Judgment: Judgment normal.      Lab Results:  CBC    Component Value Date/Time   WBC 5.6 03/16/2018 0842   RBC 4.11 (L) 03/16/2018 0842   HGB 14.5 03/16/2018 0842   HGB 14.3 12/10/2016 1042   HCT 40.7 03/16/2018 0842   HCT 42.1 12/10/2016 1042   PLT 279 03/16/2018 0842   PLT 121 (L) 12/10/2016 1042   MCV 99.0 03/16/2018 0842   MCV 98.9 (H) 12/10/2016 1042   MCH 35.3  (H) 03/16/2018 0842   MCHC 35.6 03/16/2018 0842   RDW 12.3 03/16/2018 0842   RDW 12.6 12/10/2016 1042   LYMPHSABS 0.9 02/05/2018 1735   LYMPHSABS 0.7 (  L) 12/10/2016 1042   MONOABS 0.7 02/05/2018 1735   MONOABS 0.4 12/10/2016 1042   EOSABS 0.0 02/05/2018 1735   EOSABS 0.1 12/10/2016 1042   BASOSABS 0.0 02/05/2018 1735   BASOSABS 0.0 12/10/2016 1042    BMET    Component Value Date/Time   NA 137 03/16/2018 0842   NA 137 12/10/2016 1042   K 4.9 03/16/2018 0842   K 4.4 12/10/2016 1042   CL 101 03/16/2018 0842   CO2 29 03/16/2018 0842   CO2 28 12/10/2016 1042   GLUCOSE 100 (H) 03/16/2018 0842   GLUCOSE 96 12/10/2016 1042   BUN 13 03/16/2018 0842   BUN 9.7 12/10/2016 1042   CREATININE 0.86 03/16/2018 0842   CREATININE 0.8 12/10/2016 1042   CALCIUM 9.3 03/16/2018 0842   CALCIUM 8.7 12/10/2016 1042   GFRNONAA 98 03/16/2018 0842   GFRAA 113 03/16/2018 0842    BNP No results found for: BNP  ProBNP No results found for: PROBNP    Assessment & Plan:   GOLD COPD II B Plan: Patient to stop Symbicort 160 use today Continue Spiriva Respimat 2.5 Okay to cancel currently scheduled clinic pharmacy appointment as patient reports his medications are affordable Emphasized the need to stop smoking Flu vaccine today   OSA (obstructive sleep apnea) Plan: Continue CPAP therapy  MAI (mycobacterium avium-intracellulare) (HCC) Plan: Continue azithromycin Continue ethambutol Keep scheduled appointment with Dr. [name] Continue follow-up with infectious disease  HIV (human immunodeficiency virus infection) ([place]) Plan: Continue follow-up with infectious disease  Healthcare maintenance Plan: Flu vaccine today Patient needs to follow-up with infectious disease with the health department regarding need for meningitis vaccine  Medication management Previously scheduled clinic pharmacy office visit, patient would like to cancel this today Patient reporting that he can  afford his medications at this time  Plan: We will cancel patient's appointment at his request May need to consider clinic pharmacist visit in the future to further evaluate medications or patient could coordinate this with infectious disease pharmacy team  Tobacco abuse Plan: We recommend that you stop smoking Flu vaccine today  Lung mass Plan: Continue follow-up with [facility] Dr. [name] Continue follow-up with infectious disease    Return in about 4 weeks (around 09/30/2018), or if symptoms worsen or fail to improve, for Follow up with Dr. Valeta Harms - new pt to Dr. Valeta Harms needs 23mn slot.   BLauraine Rinne NP 09/02/2018   This appointment was 44 minutes long with over 50% of the time in direct face-to-face patient care, assessment, plan of care, and follow-up.

## 2018-09-02 ENCOUNTER — Encounter: Payer: Self-pay | Admitting: Pulmonary Disease

## 2018-09-02 ENCOUNTER — Ambulatory Visit: Payer: Medicaid Other | Admitting: Pulmonary Disease

## 2018-09-02 ENCOUNTER — Other Ambulatory Visit: Payer: Self-pay

## 2018-09-02 VITALS — BP 116/80 | HR 65 | Temp 98.0°F | Ht 70.0 in | Wt 141.4 lb

## 2018-09-02 DIAGNOSIS — J439 Emphysema, unspecified: Secondary | ICD-10-CM | POA: Diagnosis not present

## 2018-09-02 DIAGNOSIS — Z0000 Encounter for general adult medical examination without abnormal findings: Secondary | ICD-10-CM

## 2018-09-02 DIAGNOSIS — R918 Other nonspecific abnormal finding of lung field: Secondary | ICD-10-CM

## 2018-09-02 DIAGNOSIS — A310 Pulmonary mycobacterial infection: Secondary | ICD-10-CM | POA: Diagnosis not present

## 2018-09-02 DIAGNOSIS — Z23 Encounter for immunization: Secondary | ICD-10-CM

## 2018-09-02 DIAGNOSIS — Z79899 Other long term (current) drug therapy: Secondary | ICD-10-CM

## 2018-09-02 DIAGNOSIS — Z720 Tobacco use: Secondary | ICD-10-CM

## 2018-09-02 DIAGNOSIS — F17210 Nicotine dependence, cigarettes, uncomplicated: Secondary | ICD-10-CM | POA: Diagnosis not present

## 2018-09-02 DIAGNOSIS — B20 Human immunodeficiency virus [HIV] disease: Secondary | ICD-10-CM

## 2018-09-02 DIAGNOSIS — G4733 Obstructive sleep apnea (adult) (pediatric): Secondary | ICD-10-CM

## 2018-09-02 DIAGNOSIS — Z21 Asymptomatic human immunodeficiency virus [HIV] infection status: Secondary | ICD-10-CM

## 2018-09-02 NOTE — Assessment & Plan Note (Signed)
Plan: Flu vaccine today Patient needs to follow-up with infectious disease with the health department regarding need for meningitis vaccine

## 2018-09-02 NOTE — Assessment & Plan Note (Signed)
Plan: Continue CPAP therapy 

## 2018-09-02 NOTE — Assessment & Plan Note (Signed)
Plan: We recommend that you stop smoking Flu vaccine today

## 2018-09-02 NOTE — Assessment & Plan Note (Signed)
Plan: Patient to stop Symbicort 160 use today Continue Spiriva Respimat 2.5 Okay to cancel currently scheduled clinic pharmacy appointment as patient reports his medications are affordable Emphasized the need to stop smoking Flu vaccine today

## 2018-09-02 NOTE — Assessment & Plan Note (Signed)
Plan: Continue azithromycin Continue ethambutol Keep scheduled appointment with Dr. [name] Continue follow-up with infectious disease

## 2018-09-02 NOTE — Telephone Encounter (Signed)
Appointment canceled by providers as he was able to fill inhalers without issue.

## 2018-09-02 NOTE — Assessment & Plan Note (Signed)
Previously scheduled clinic pharmacy office visit, patient would like to cancel this today Patient reporting that he can afford his medications at this time  Plan: We will cancel patient's appointment at his request May need to consider clinic pharmacist visit in the future to further evaluate medications or patient could coordinate this with infectious disease pharmacy team

## 2018-09-02 NOTE — Assessment & Plan Note (Signed)
Plan: Continue follow-up with [facility] Dr. [name] Continue follow-up with infectious disease

## 2018-09-02 NOTE — Assessment & Plan Note (Signed)
Plan: Continue follow-up with infectious disease 

## 2018-09-02 NOTE — Patient Instructions (Addendum)
You were seen today by Lauraine Rinne, NP  for:   1. MAI (mycobacterium avium-intracellulare) (HCC)  Continue ethambutol Continue azithromycin Continue follow-up with infectious disease Continue follow-up with cardiothoracic surgery, Dr. [name] -keep scheduled follow-up to further review CT  2. Pulmonary emphysema, unspecified emphysema type ([place])  Okay to stop Symbicort 160 as she have not been taking it  Continue Spiriva Respimat 2.5 >>> 2 puffs daily >>> Do this every day >>>This is not a rescue inhaler  Only use your albuterol as a rescue medication to be used if you can't catch your breath by resting or doing a relaxed purse lip breathing pattern.  - The less you use it, the better it will work when you need it. - Ok to use up to 2 puffs  every 4 hours if you must but call for immediate appointment if use goes up over your usual need - Don't leave home without it !!  (think of it like the spare tire for your car)   Okay to continue DuoNeb nebulized medications every 6-8 hours as needed for shortness of breath or wheezing  Note your daily symptoms > remember "red flags" for COPD:   >>>Increase in cough >>>increase in sputum production >>>increase in shortness of breath or activity  intolerance.   If you notice these symptoms, please call the office to be seen.    We recommend that you stop smoking.  >>>You need to set a quit date >>>If you have friends or family who smoke, let them know you are trying to quit and not to smoke around you or in your living environment  Smoking Cessation Resources:  1 800 QUIT NOW  >>> Patient to call this resource and utilize it to help support her quit smoking >>> Keep up your hard work with stopping smoking  You can also contact the [employer] >>>For smoking cessation classes call [phone]  We do not recommend using e-cigarettes as a form of stopping smoking  You can sign up for smoking cessation support texts  and information:  >>>https://smokefree.gov/smokefreetxt  Flu vaccine today  3. OSA (obstructive sleep apnea)  We recommend that you continue using your CPAP daily >>>Keep up the hard work using your device >>> Goal should be wearing this for the entire night that you are sleeping, at least 4 to 6 hours  Remember:   Do not drive or operate heavy machinery if tired or drowsy.   Please notify the supply company and office if you are unable to use your device regularly due to missing supplies or machine being broken.   Work on maintaining a healthy weight and following your recommended nutrition plan   Maintain proper daily exercise and movement   Maintaining proper use of your device can also help improve management of other chronic illnesses such as: Blood pressure, blood sugars, and weight management.   BiPAP/ CPAP Cleaning:  >>>Clean weekly, with Dawn soap, and bottle brush.  Set up to air dry.  4. Healthcare maintenance  Flu vaccine today  We recommend that you stop smoking  5. Medication management  You reported today that your medication cost are not an issue, we will cancel the clinic pharmacy team meeting at your request  Follow Up:    Return in about 4 weeks (around 09/30/2018), or if symptoms worsen or fail to improve, for Follow up with Dr. Valeta Harms - new pt to Dr. Valeta Harms needs 69min slot.   Please do your part to reduce  the spread of COVID-19:      Reduce your risk of any infection  and COVID19 by using the similar precautions used for avoiding the common cold or flu:   Wash your hands often with soap and warm water for at least 20 seconds.  If soap and water are not readily available, use an alcohol-based hand sanitizer with at least 60% alcohol.   If coughing or sneezing, cover your mouth and nose by coughing or sneezing into the elbow areas of your shirt or coat, into a tissue or into your sleeve (not your hands).  WEAR A MASK when in public   Avoid  shaking hands with others and consider head nods or verbal greetings only.  Avoid touching your eyes, nose, or mouth with unwashed hands.   Avoid close contact with people who are sick.  Avoid places or events with large numbers of people in one location, like concerts or sporting events.  If you have some symptoms but not all symptoms, continue to monitor at home and seek medical attention if your symptoms worsen.  If you are having a medical emergency, call 911.   [place] / e-Visit: [email or web address]         [facility]: [facility]: [id number]                   [facility]: [id number]     It is flu season:   >>> Best ways to protect herself from the flu: Receive the yearly flu vaccine, practice good hand hygiene washing with soap and also using hand sanitizer when available, eat a nutritious meals, get adequate rest, hydrate appropriately   Please contact the office if your symptoms worsen or you have concerns that you are not improving.   Thank you for choosing [facility] for your healthcare, and for allowing [place] to partner with you on your healthcare journey. I am thankful to be able to provide care to you today.   [name] FNP-C      Influenza Virus Vaccine injection What is this medicine? INFLUENZA VIRUS VACCINE (in floo EN zuh VAHY ruhs vak SEEN) helps to reduce the risk of getting influenza also known as the flu. The vaccine only helps protect you against some strains of the flu. This medicine may be used for other purposes; ask your health care provider or pharmacist if you have questions. COMMON BRAND NAME(S): Afluria, Afluria Quadrivalent, Agriflu, Alfuria, FLUAD, Fluarix, Fluarix Quadrivalent, Flublok, Flublok Quadrivalent, FLUCELVAX, Flulaval, Fluvirin, Fluzone, Fluzone High-Dose, Fluzone  Intradermal What should I tell my health care provider before I take this medicine? They need to know if you have any of these conditions:  bleeding disorder like hemophilia  fever or infection  Guillain-Barre syndrome or other neurological problems  immune system problems  infection with the human immunodeficiency virus (HIV) or AIDS  low blood platelet counts  multiple sclerosis  an unusual or allergic reaction to influenza virus vaccine, latex, other medicines, foods, dyes, or preservatives. Different brands of vaccines contain different allergens. Some may contain latex or eggs. Talk to your doctor about your allergies to make sure that you get the right vaccine.  pregnant or trying to get pregnant  breast-feeding How should I use this medicine? This vaccine is for injection into a muscle or under the skin. It is given by a health care professional. A copy of Vaccine Information Statements will  be given before each vaccination. Read this sheet carefully each time. The sheet may change frequently. Talk to your healthcare provider to see which vaccines are right for you. Some vaccines should not be used in all age groups. Overdosage: If you think you have taken too much of this medicine contact a poison control center or emergency room at once. NOTE: This medicine is only for you. Do not share this medicine with others. What if I miss a dose? This does not apply. What may interact with this medicine?  chemotherapy or radiation therapy  medicines that lower your immune system like etanercept, anakinra, infliximab, and adalimumab  medicines that treat or prevent blood clots like warfarin  phenytoin  steroid medicines like prednisone or cortisone  theophylline  vaccines This list may not describe all possible interactions. Give your health care provider a list of all the medicines, herbs, non-prescription drugs, or dietary supplements you use. Also tell them if you smoke,  drink alcohol, or use illegal drugs. Some items may interact with your medicine. What should I watch for while using this medicine? Report any side effects that do not go away within 3 days to your doctor or health care professional. Call your health care provider if any unusual symptoms occur within 6 weeks of receiving this vaccine. You may still catch the flu, but the illness is not usually as bad. You cannot get the flu from the vaccine. The vaccine will not protect against colds or other illnesses that may cause fever. The vaccine is needed every year. What side effects may I notice from receiving this medicine? Side effects that you should report to your doctor or health care professional as soon as possible:  allergic reactions like skin rash, itching or hives, swelling of the face, lips, or tongue Side effects that usually do not require medical attention (report to your doctor or health care professional if they continue or are bothersome):  fever  headache  muscle aches and pains  pain, tenderness, redness, or swelling at the injection site  tiredness This list may not describe all possible side effects. Call your doctor for medical advice about side effects. You may report side effects to FDA at 1-800-FDA-1088. Where should I keep my medicine? The vaccine will be given by a health care professional in a clinic, pharmacy, doctor's office, or other health care setting. You will not be given vaccine doses to store at home. NOTE: This sheet is a summary. It may not cover all possible information. If you have questions about this medicine, talk to your doctor, pharmacist, or health care provider.  2020 Elsevier/Gold Standard (2017-11-18 08:45:43)

## 2018-09-06 ENCOUNTER — Other Ambulatory Visit: Payer: Self-pay | Admitting: Infectious Diseases

## 2018-09-06 DIAGNOSIS — A310 Pulmonary mycobacterial infection: Secondary | ICD-10-CM

## 2018-09-07 NOTE — Progress Notes (Signed)
Reviewed, agree 

## 2018-09-08 ENCOUNTER — Ambulatory Visit: Payer: Medicaid Other | Admitting: Thoracic Surgery (Cardiothoracic Vascular Surgery)

## 2018-09-08 ENCOUNTER — Other Ambulatory Visit: Payer: Self-pay

## 2018-09-08 ENCOUNTER — Encounter: Payer: Self-pay | Admitting: Thoracic Surgery (Cardiothoracic Vascular Surgery)

## 2018-09-08 VITALS — BP 129/77 | HR 77 | Temp 97.8°F | Resp 20 | Ht 70.0 in | Wt 142.0 lb

## 2018-09-08 DIAGNOSIS — R911 Solitary pulmonary nodule: Secondary | ICD-10-CM

## 2018-09-08 DIAGNOSIS — J449 Chronic obstructive pulmonary disease, unspecified: Secondary | ICD-10-CM | POA: Diagnosis not present

## 2018-09-08 DIAGNOSIS — G4733 Obstructive sleep apnea (adult) (pediatric): Secondary | ICD-10-CM | POA: Diagnosis not present

## 2018-09-08 NOTE — Progress Notes (Signed)
[address]       [place] [address]             [phone]     HPI: Mr. Calvin Schroeder returns for a scheduled follow-up  Deno Sida is a 58 year old man with a past medical history significant for heavy tobacco abuse, COPD, pulmonary MAI, HIV, Kaposi's sarcoma, and chronic back pain.  He presented in the fall 2018.  A CT of the chest showed a complex consolidative process in the left upper lobe.  Navigational bronchoscopy showed granulomas and cultures grew MAI.  He was started on triple antibiotic therapy.  He was unable to tolerate that and currently is on 2 drug treatment with azithromycin and ethambutol.  I last saw him in February 2020.  His CT at that time showed some improvement although there was still a complex consolidative and cavitary left upper lobe "mass."  I continues to smoke a pack a day.  He has not had any recent changes in his respiratory status.  He has chronic shortness of breath.  He denies any unusual cough.  He does have occasional clear sputum.  He denies hemoptysis.  He denies fevers and chills.  No weight loss.  Past Medical History:  Diagnosis Date  . Back pain   . COPD (chronic obstructive pulmonary disease) (HCC)    emphysema   . Dyspnea    with exertion   . HIV (human immunodeficiency virus infection) ([place])   . 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 Pneumonia    11/2016  . Tobacco abuse 06/10/2013    Current Outpatient Medications  Medication Sig Dispense Refill  . azithromycin (ZITHROMAX) 600 MG tablet TAKE 1 TABLET(600 MG) BY MOUTH DAILY 30 tablet 3  . budesonide-formoterol (SYMBICORT) 160-4.5 MCG/ACT inhaler Inhale 2 puffs into the lungs 2 (two) times a day. 1 Inhaler 6  . diphenoxylate-atropine (LOMOTIL) 2.5-0.025 MG tablet TAKE 1 BY MOUTH FOUR TIMES DAILY AS NEEDED FOR DIARRHEA 30 tablet 0  . dolutegravir (TIVICAY) 50 MG tablet Take 1 tablet (50 mg total) by mouth daily. 30 tablet 5  . emtricitabine-tenofovir AF (DESCOVY) 200-25  MG tablet Take 1 tablet by mouth daily. 90 tablet 5  . ethambutol (MYAMBUTOL) 400 MG tablet TAKE 2 AND 1/2 TABLETS(1000 MG) BY MOUTH DAILY 225 tablet 3  . ipratropium-albuterol (DUONEB) 0.5-2.5 (3) MG/3ML SOLN Take 3 mLs by nebulization every 6 (six) hours as needed. 360 mL 5  . Tiotropium Bromide Monohydrate (SPIRIVA RESPIMAT) 2.5 MCG/ACT AERS Inhale 2 puffs into the lungs daily. 4 g 6   No current facility-administered medications for this visit.     Physical Exam BP 129/77   Pulse 77   Temp 97.8 F (36.6 C) (Skin)   Resp 20   Ht _0  (1.778 m)   Wt 142 lb (64.4 kg)   SpO2 94% Comment: RA  BMI 20.72 kg/m  58 year old man in no acute distress Alert and oriented x3 with no focal deficit Wearing facemask Cardiac regular rate and rhythm Lungs diminished breath sounds bilaterally, no wheezing  Diagnostic Tests: CT CHEST WITHOUT CONTRAST  TECHNIQUE: Multidetector CT imaging of the chest was performed following the standard protocol without IV contrast.  COMPARISON:  Chest CT February 09, 2018  FINDINGS: Cardiovascular: There is no demonstrable thoracic aortic aneurysm. The visualized great vessels appear unremarkable on this noncontrast enhanced study. Note that the right innominate and left common carotid arteries arise immediately adjacent to each other. There is  no pericardial effusion or pericardial thickening. There are scattered foci of coronary artery calcification.  Mediastinum/Nodes: Thyroid appears unremarkable. There are occasional subcentimeter lymph nodes. There is no adenopathy by size criteria in the thoracic region. No esophageal lesions are demonstrable.  Lungs/Pleura: The cavitary region in the posterior segment left upper lobe toward the apex measures 8.5 x 5.1 cm, slightly larger than on the previous study. In comparison with the previous study, there is increase in soft tissue thickening along the superolateral aspect of this lesion. The area  of cavitation appears essentially stable. The thickening in this area is irregular although overall somewhat similar in contour compared to the previous study. An area of soft tissue thickening along the posterior aspect of this lesion which currently measured 1.8 x 1.7 cm currently measures 2.0 x 1.6 cm. Soft tissue thickening more medially along this lesion currently measures 2.4 x 1.4 cm compared to a measurement on the previous study of 1.4 x 1.1 cm. There is an irregular nodular opacity in the anterior segment of the right upper lobe measuring 1.7 x 1.1 cm, compared to a measured value of 1.5 x 0.8 cm on prior study. This lesion is currently best seen on axial slice 69 series 8. A nodular opacity in the posterior segment left upper lobe currently measures 1.3 x 1.0 cm compared to a prior measurement of 1.0 x 0.8 cm. Scattered areas of scarring elsewhere appear stable. There is no frank consolidation. There are stable nodular opacities in the inferior lingula, largest measuring 6 mm. No pleural effusions are evident.  Upper Abdomen: Visualized upper abdominal structures appear unremarkable on this noncontrast enhanced study except for mild upper abdominal aortic atherosclerosis.  Musculoskeletal: No blastic or lytic bone lesions. No intramuscular lesions evident.  IMPRESSION: 1.  Extensive underlying emphysematous change.  Mild bronchiectasis.  2. Nodular lesions with dominant cavitary lesion in the left upper lobe remain. Slightly more thickening in the periphery of the left upper lobe nodular lesion and to a lesser extent slight increase in thickening medially. No new cavitation evident. A slow growing neoplasm must be of concern. This area may warrant bronchoscopy and/or tissue sampling to further evaluate. At a minimum, a follow-up CT in approximately 3 months advised to further evaluate. Other nodular areas in the right upper lobe show marginal increase in size  compared to most recent study. No entirely new lesions evident. Subcentimeter nodular lesions in the inferior lingula appear stable.  3. Scattered subcentimeter lymph nodes without adenopathy by size criteria.  4. Occasional foci of aortic atherosclerosis. Foci of coronary artery calcification evident.  Aortic Atherosclerosis (ICD10-I70.0) and Emphysema (ICD10-J43.9).   Electronically Signed   By: Lowella Grip III M.D.   On: 08/24/2018 08:36 I personally reviewed the CT images and concur with the findings noted above  Impression: Calvin Schroeder is a 58 year old man with history of tobacco abuse, COPD, pulmonary MAI, HIV, Kaposi's sarcoma, and chronic back pain.  He was first found to have a complex cavitary left upper lobe lung mass in 2018.  Lung nodules-complex cavitary mass in left upper lobe.  Overall appears slightly smaller although the wall does appear a little thicker.  Is unclear if this is just scarring.  This is likely all just due to his chronic MAC infection and evolution over time.  Likely represents increased scarring rather than a failure to respond to treatment.  Failure to respond to treatment is also a possibility.  He does not have any symptoms currently to suggest a  worsening infectious process.  It is impossible to rule out the possibility of neoplasm somewhere in that lesion, but I do not see anything that would indicate a high risk surgical resection at this point in time.  MAC infection-on azithromycin and ethambutol.  Followed by Dr. [name].  Tobacco abuse-continues to smoke and gives no indication of plans to discontinue.  COPD-managed by pulmonary  Plan: Return in 3 months with repeat CT of chest  [name], MD [facility] [phone]

## 2018-09-09 ENCOUNTER — Ambulatory Visit: Payer: Medicaid Other

## 2018-09-18 DIAGNOSIS — M545 Low back pain: Secondary | ICD-10-CM | POA: Diagnosis not present

## 2018-09-18 DIAGNOSIS — G4733 Obstructive sleep apnea (adult) (pediatric): Secondary | ICD-10-CM | POA: Diagnosis not present

## 2018-09-18 DIAGNOSIS — B20 Human immunodeficiency virus [HIV] disease: Secondary | ICD-10-CM | POA: Diagnosis not present

## 2018-09-18 DIAGNOSIS — J449 Chronic obstructive pulmonary disease, unspecified: Secondary | ICD-10-CM | POA: Diagnosis not present

## 2018-09-18 DIAGNOSIS — Z79891 Long term (current) use of opiate analgesic: Secondary | ICD-10-CM | POA: Diagnosis not present

## 2018-09-18 DIAGNOSIS — M25511 Pain in right shoulder: Secondary | ICD-10-CM | POA: Diagnosis not present

## 2018-09-18 DIAGNOSIS — M25512 Pain in left shoulder: Secondary | ICD-10-CM | POA: Diagnosis not present

## 2018-09-18 DIAGNOSIS — M542 Cervicalgia: Secondary | ICD-10-CM | POA: Diagnosis not present

## 2018-09-18 DIAGNOSIS — G894 Chronic pain syndrome: Secondary | ICD-10-CM | POA: Diagnosis not present

## 2018-09-23 ENCOUNTER — Encounter: Payer: Self-pay | Admitting: Pulmonary Disease

## 2018-09-23 ENCOUNTER — Other Ambulatory Visit: Payer: Self-pay

## 2018-09-23 ENCOUNTER — Ambulatory Visit: Payer: Medicaid Other

## 2018-09-23 ENCOUNTER — Ambulatory Visit: Payer: Medicaid Other | Admitting: Pulmonary Disease

## 2018-09-23 VITALS — BP 126/78 | HR 77 | Temp 97.6°F | Ht 70.0 in | Wt 144.4 lb

## 2018-09-23 DIAGNOSIS — F17200 Nicotine dependence, unspecified, uncomplicated: Secondary | ICD-10-CM

## 2018-09-23 DIAGNOSIS — G4733 Obstructive sleep apnea (adult) (pediatric): Secondary | ICD-10-CM | POA: Diagnosis not present

## 2018-09-23 DIAGNOSIS — B20 Human immunodeficiency virus [HIV] disease: Secondary | ICD-10-CM

## 2018-09-23 DIAGNOSIS — Z7689 Persons encountering health services in other specified circumstances: Secondary | ICD-10-CM

## 2018-09-23 DIAGNOSIS — R918 Other nonspecific abnormal finding of lung field: Secondary | ICD-10-CM | POA: Diagnosis not present

## 2018-09-23 DIAGNOSIS — J449 Chronic obstructive pulmonary disease, unspecified: Secondary | ICD-10-CM

## 2018-09-23 DIAGNOSIS — A310 Pulmonary mycobacterial infection: Secondary | ICD-10-CM

## 2018-09-23 DIAGNOSIS — J439 Emphysema, unspecified: Secondary | ICD-10-CM

## 2018-09-23 MED ORDER — ALBUTEROL SULFATE HFA 108 (90 BASE) MCG/ACT IN AERS: 2.0000 | INHALATION_SPRAY | Freq: Four times a day (QID) | RESPIRATORY_TRACT | 5 refills | Status: DC | PRN

## 2018-09-23 NOTE — Progress Notes (Signed)
Synopsis: Referred in September 2020 former patient Dr. Lake Bells.  HBZ:JIRCV, Lu Duffel, PA-C  Subjective:   PATIENT ID: Calvin Schroeder GENDER: male DOB: December 27, 1960, MRN: [id number]  Chief Complaint  Patient presents with   New Patient (Initial Visit)    Former BQ patient. He reports his breathing has been at his baseline. Currenlty using spiriva respimat daily and duoneb prn.     58 year old gentleman past medical history of COPD (PFTs 2019 ratio 65 FEV1 61%), emphysema, HIV, MAI.  History of Kaposi's sarcoma.  He initially presented in 2018 was seen by cardiothoracic surgery taken for navigational bronchoscopy biopsies revealed granulomas and cultures grew MAI.  He was started on triple antibiotic regimen at that time he did not tolerate and was reduced to 2 drug regimen azithromycin and ethambutol.  Last seen by Dr. [name] in February 2020 and beginning of September 2020.  Repeat CT imaging with a large complex consolidation with cavitation within the left upper lobe.  However with some improvement.  Unfortunately he continues to smoke approximately 1 pack/day.  Dr. [name] plans on seeing him back in 3 months with repeat CT imaging.  No plans for surgical intervention at this time.  OV 09/23/2018: Patient here today to establish care with new primary pulmonologist.  Patient has been followed by Dr. Lake Bells for several years.  Last seen by Dr. Lake Bells in March 2020.  Patient has severe COPD was started on Trelegy at that time.  Since then has had multiple follow-ups with [name], NP in clinic.  Last seen in August 2020.  Inhaler regimen has subsequently been changed to Symbicort 160, Spiriva 2.5.  Unfortunately still smoking even at this time.  At this point he is doing well.  He feels short of breath at baseline.  This is unchanged from prior.  He has stopped using Symbicort.  He thinks that he does fine with Spiriva only.  He has been using his albuterol inhaler regularly.  He has  been taking his antiretroviral medications and MAC medications regularly.    Past Medical History:  Diagnosis Date   Back pain    COPD (chronic obstructive pulmonary disease) (HCC)    emphysema    Dyspnea    with exertion    HIV (human immunodeficiency virus infection) ([place])    Lung mass 06/10/2013   MAI   Pneumonia    11/2016   Tobacco abuse 06/10/2013     Family History  Problem Relation Age of Onset   Cancer Mother        lung ca   Cancer Father        skin ca   Cancer Maternal Uncle        liver ca   Cancer Maternal Grandmother        bladder ca     Past Surgical History:  Procedure Laterality Date   APPENDECTOMY     SHOULDER SURGERY     VIDEO BRONCHOSCOPY WITH ENDOBRONCHIAL NAVIGATION N/A 02/07/2017   Procedure: VIDEO BRONCHOSCOPY WITH ENDOBRONCHIAL NAVIGATION;  Surgeon: [name], MD;  Location: [facility];  Service: Thoracic;  Laterality: N/A;   VIDEO BRONCHOSCOPY WITH ENDOBRONCHIAL ULTRASOUND N/A 02/07/2017   Procedure: VIDEO BRONCHOSCOPY WITH ENDOBRONCHIAL ULTRASOUND;  Surgeon: [name], MD;  Location: [facility];  Service: Thoracic;  Laterality: N/A;    Social History   Socioeconomic History   Marital status: Single    Spouse name: Not on file   Number of children: Not on file  Years of education: Not on file   Highest education level: Not on file  Occupational History   Not on file  Social Needs   Financial resource strain: Not on file   Food insecurity    Worry: Not on file    Inability: Not on file   Transportation needs    Medical: Not on file    Non-medical: Not on file  Tobacco Use   Smoking status: Current Every Day Smoker    Packs/day: 1.00    Years: 40.00    Pack years: 40.00   Smokeless tobacco: Never Used   Tobacco comment: 1 pack per day currently, quit date set for 12/23/17 per pt   Substance and Sexual Activity   Alcohol use: Yes    Comment: daily - 3-4 alcohol   Drug use: Yes    Types:  Marijuana    Comment: daily    Sexual activity: Not on file  Lifestyle   Physical activity    Days per week: Not on file    Minutes per session: Not on file   Stress: Not on file  Relationships   Social connections    Talks on phone: Not on file    Gets together: Not on file    Attends religious service: Not on file    Active member of club or organization: Not on file    Attends meetings of clubs or organizations: Not on file    Relationship status: Not on file   Intimate partner violence    Fear of current or ex partner: Not on file    Emotionally abused: Not on file    Physically abused: Not on file    Forced sexual activity: Not on file  Other Topics Concern   Not on file  Social History Narrative   Not on file     No Active Allergies   Outpatient Medications Prior to Visit  Medication Sig Dispense Refill   azithromycin (ZITHROMAX) 600 MG tablet TAKE 1 TABLET(600 MG) BY MOUTH DAILY 30 tablet 3   diphenoxylate-atropine (LOMOTIL) 2.5-0.025 MG tablet TAKE 1 BY MOUTH FOUR TIMES DAILY AS NEEDED FOR DIARRHEA 30 tablet 0   dolutegravir (TIVICAY) 50 MG tablet Take 1 tablet (50 mg total) by mouth daily. 30 tablet 5   emtricitabine-tenofovir AF (DESCOVY) 200-25 MG tablet Take 1 tablet by mouth daily. 90 tablet 5   ethambutol (MYAMBUTOL) 400 MG tablet TAKE 2 AND 1/2 TABLETS(1000 MG) BY MOUTH DAILY 225 tablet 3   ipratropium-albuterol (DUONEB) 0.5-2.5 (3) MG/3ML SOLN Take 3 mLs by nebulization every 6 (six) hours as needed. 360 mL 5   Tiotropium Bromide Monohydrate (SPIRIVA RESPIMAT) 2.5 MCG/ACT AERS Inhale 2 puffs into the lungs daily. 4 g 6   budesonide-formoterol (SYMBICORT) 160-4.5 MCG/ACT inhaler Inhale 2 puffs into the lungs 2 (two) times a day. (Patient not taking: Reported on 09/23/2018) 1 Inhaler 6   No facility-administered medications prior to visit.     Review of Systems  Constitutional: Negative for chills, fever, malaise/fatigue and weight loss.  HENT:  Negative for hearing loss, sore throat and tinnitus.   Eyes: Negative for blurred vision and double vision.  Respiratory: Positive for cough, sputum production and shortness of breath. Negative for hemoptysis, wheezing and stridor.   Cardiovascular: Negative for chest pain, palpitations, orthopnea, leg swelling and PND.  Gastrointestinal: Negative for abdominal pain, constipation, diarrhea, heartburn, nausea and vomiting.  Genitourinary: Negative for dysuria, hematuria and urgency.  Musculoskeletal: Negative for joint pain and myalgias.  Skin: Negative for itching and rash.  Neurological: Negative for dizziness, tingling, weakness and headaches.  Endo/Heme/Allergies: Negative for environmental allergies. Does not bruise/bleed easily.  Psychiatric/Behavioral: Negative for depression. The patient is not nervous/anxious and does not have insomnia.   All other systems reviewed and are negative.    Objective:  Physical Exam Vitals signs reviewed.  Constitutional:      General: He is not in acute distress.    Appearance: He is well-developed.     Comments: Low weight.  HENT:     Head: Normocephalic and atraumatic.  Eyes:     General: No scleral icterus.    Conjunctiva/sclera: Conjunctivae normal.     Pupils: Pupils are equal, round, and reactive to light.  Neck:     Musculoskeletal: Neck supple.     Vascular: No JVD.     Trachea: No tracheal deviation.     Comments: Thin Cardiovascular:     Rate and Rhythm: Normal rate and regular rhythm.     Heart sounds: Normal heart sounds. No murmur.  Pulmonary:     Effort: Pulmonary effort is normal. No tachypnea, accessory muscle usage or respiratory distress.     Breath sounds: No stridor. No wheezing, rhonchi or rales.     Comments: Diminished bilateral upper lobe breath sounds Abdominal:     General: Bowel sounds are normal. There is no distension.     Palpations: Abdomen is soft.     Tenderness: There is no abdominal tenderness.    Musculoskeletal:        General: No tenderness.  Lymphadenopathy:     Cervical: No cervical adenopathy.  Skin:    General: Skin is warm and dry.     Capillary Refill: Capillary refill takes less than 2 seconds.     Findings: No rash.     Comments: Pigmented skin lesion on right upper extremity medial arm.  Consistent with previous diagnosis of Kaposi  Neurological:     Mental Status: He is alert and oriented to person, place, and time.  Psychiatric:        Behavior: Behavior normal.      Vitals:   09/23/18 0938  BP: 126/78  Pulse: 77  Temp: 97.6 F (36.4 C)  TempSrc: Temporal  SpO2: 98%  Weight: 144 lb 6.4 oz (65.5 kg)  Height: _0  (1.778 m)   98% on RA BMI Readings from Last 3 Encounters:  09/23/18 20.72 kg/m  09/08/18 20.37 kg/m  09/02/18 20.29 kg/m   Wt Readings from Last 3 Encounters:  09/23/18 144 lb 6.4 oz (65.5 kg)  09/08/18 142 lb (64.4 kg)  09/02/18 141 lb 6.4 oz (64.1 kg)     CBC    Component Value Date/Time   WBC 5.6 03/16/2018 0842   RBC 4.11 (L) 03/16/2018 0842   HGB 14.5 03/16/2018 0842   HGB 14.3 12/10/2016 1042   HCT 40.7 03/16/2018 0842   HCT 42.1 12/10/2016 1042   PLT 279 03/16/2018 0842   PLT 121 (L) 12/10/2016 1042   MCV 99.0 03/16/2018 0842   MCV 98.9 (H) 12/10/2016 1042   MCH 35.3 (H) 03/16/2018 0842   MCHC 35.6 03/16/2018 0842   RDW 12.3 03/16/2018 0842   RDW 12.6 12/10/2016 1042   LYMPHSABS 0.9 02/05/2018 1735   LYMPHSABS 0.7 (L) 12/10/2016 1042   MONOABS 0.7 02/05/2018 1735   MONOABS 0.4 12/10/2016 1042   EOSABS 0.0 02/05/2018 1735   EOSABS 0.1 12/10/2016 1042   BASOSABS 0.0 02/05/2018 1735  BASOSABS 0.0 12/10/2016 1042    Chest Imaging: 08/24/2018 CT chest: Extensive emphysematous change with bronchiectasis nodular lesions with dominant cavitary lesion within the left upper lobe. The patient's images have been independently reviewed by me.    Pulmonary Functions Testing Results: PFT Results Latest Ref Rng &  Units 06/11/2017  FVC-Pre L 3.63  FVC-Predicted Pre % 73  Pre FEV1/FVC % % 65  FEV1-Pre L 2.34  FEV1-Predicted Pre % 61    FeNO: None  Pathology:   February 2019 bronchoscopy-Dr. [name] Tissue with granulomatous inflammation, some giant cells Negative for malignancy  Cultures: Bronchoscopy February 2019+ for Mycobacterium avium complex: Pansensitive  Echocardiogram: None   Heart Catheterization: None     Assessment & Plan:     ICD-10-CM   1. 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 HIV infection, unspecified symptom status ([place])  B20   3. AIDS (acquired immune deficiency syndrome) ([place])  B20   4. Lung mass  R91.8   5. OSA (obstructive sleep apnea)  G47.33   6. Pulmonary emphysema, unspecified emphysema type ([place])  J43.9   7. Stage 2 moderate COPD by GOLD classification ([place])  J44.9   8. Current smoker  F17.200   9. Establishing care with new doctor, encounter for  Z76.89     Discussion:  This is a 58 year old gentleman with severe upper lobe emphysema on CAT scan, large left-sided cavitary lesion that has been followed for some time.  Consistent with infection related to MAI.  Patient was started on dual therapy azithromycin plus ethambutol by Dr. [name].  He underwent bronchoscopy initially for concern of potential upper lobe malignancy.  This has continued to be followed by Dr. [name].  He has a follow-up appointment with Dr. [name] in 3 months with a repeat CAT scan.  As for his COPD management he can continue on Spiriva 2.5.. Stopping the inhaled steroid is likely a good idea in the setting of MAI. If he continues to have worsening COPD associated dyspnea going to [place] would be my preferred next step.  He must quit smoking. Majority of today's visit was spent counseling on various techniques for smoking cessation. I think that he would be a good candidate for tapering method.  We talked about this today. He also needs refills of  his albuterol inhaler.  Patient to return to our clinic in 6 months or as needed if symptoms worsen. He would like to follow-up with Dr. [name] sooner.  I will send a message to him.  Greater than 50% of this patient's 45-minute of visit was spent face-to-face discussing the above recommendations and treatment plan.  Also to review medical history medical records prior imaging and history to establish care with new primary pulmonologist.    Current Outpatient Medications:    azithromycin (ZITHROMAX) 600 MG tablet, TAKE 1 TABLET(600 MG) BY MOUTH DAILY, Disp: 30 tablet, Rfl: 3   diphenoxylate-atropine (LOMOTIL) 2.5-0.025 MG tablet, TAKE 1 BY MOUTH FOUR TIMES DAILY AS NEEDED FOR DIARRHEA, Disp: 30 tablet, Rfl: 0   dolutegravir (TIVICAY) 50 MG tablet, Take 1 tablet (50 mg total) by mouth daily., Disp: 30 tablet, Rfl: 5   emtricitabine-tenofovir AF (DESCOVY) 200-25 MG tablet, Take 1 tablet by mouth daily., Disp: 90 tablet, Rfl: 5   ethambutol (MYAMBUTOL) 400 MG tablet, TAKE 2 AND 1/2 TABLETS(1000 MG) BY MOUTH DAILY, Disp: 225 tablet, Rfl: 3   ipratropium-albuterol (DUONEB) 0.5-2.5 (3) MG/3ML SOLN, Take 3 mLs by nebulization every 6 (six) hours as needed., Disp:  360 mL, Rfl: 5   Tiotropium Bromide Monohydrate (SPIRIVA RESPIMAT) 2.5 MCG/ACT AERS, Inhale 2 puffs into the lungs daily., Disp: 4 g, Rfl: 6   albuterol (VENTOLIN HFA) 108 (90 Base) MCG/ACT inhaler, Inhale 2 puffs into the lungs every 6 (six) hours as needed for wheezing or shortness of breath., Disp: 8 g, Rfl: 5   Garner Nash, DO [facility] 09/23/2018 9:55 AM

## 2018-09-23 NOTE — Patient Instructions (Addendum)
Thank you for visiting Dr. Valeta Harms at [facility]. Today we recommend the following:  Meds ordered this encounter  Medications  . albuterol (VENTOLIN HFA) 108 (90 Base) MCG/ACT inhaler    Sig: Inhale 2 puffs into the lungs every 6 (six) hours as needed for wheezing or shortness of breath.    Dispense:  8 g    Refill:  5   Return in about 6 months (around 03/23/2019).    Please do your part to reduce the spread of COVID-19.

## 2018-10-08 DIAGNOSIS — G4733 Obstructive sleep apnea (adult) (pediatric): Secondary | ICD-10-CM | POA: Diagnosis not present

## 2018-10-08 DIAGNOSIS — J449 Chronic obstructive pulmonary disease, unspecified: Secondary | ICD-10-CM | POA: Diagnosis not present

## 2018-10-12 ENCOUNTER — Other Ambulatory Visit: Payer: Self-pay | Admitting: Infectious Diseases

## 2018-10-12 ENCOUNTER — Other Ambulatory Visit: Payer: Self-pay | Admitting: *Deleted

## 2018-10-12 DIAGNOSIS — B20 Human immunodeficiency virus [HIV] disease: Secondary | ICD-10-CM

## 2018-10-12 MED ORDER — TIVICAY 50 MG PO TABS: 50.0000 mg | ORAL_TABLET | Freq: Every day | ORAL | 3 refills | Status: DC

## 2018-10-12 MED ORDER — EMTRICITABINE-TENOFOVIR AF 200-25 MG PO TABS: 1.0000 | ORAL_TABLET | Freq: Every day | ORAL | 3 refills | Status: DC

## 2018-10-14 DIAGNOSIS — G894 Chronic pain syndrome: Secondary | ICD-10-CM | POA: Diagnosis not present

## 2018-10-14 DIAGNOSIS — B20 Human immunodeficiency virus [HIV] disease: Secondary | ICD-10-CM | POA: Diagnosis not present

## 2018-10-14 DIAGNOSIS — M25512 Pain in left shoulder: Secondary | ICD-10-CM | POA: Diagnosis not present

## 2018-10-14 DIAGNOSIS — Z79891 Long term (current) use of opiate analgesic: Secondary | ICD-10-CM | POA: Diagnosis not present

## 2018-10-14 DIAGNOSIS — M545 Low back pain: Secondary | ICD-10-CM | POA: Diagnosis not present

## 2018-10-14 DIAGNOSIS — M542 Cervicalgia: Secondary | ICD-10-CM | POA: Diagnosis not present

## 2018-10-14 DIAGNOSIS — M25511 Pain in right shoulder: Secondary | ICD-10-CM | POA: Diagnosis not present

## 2018-10-19 DIAGNOSIS — J449 Chronic obstructive pulmonary disease, unspecified: Secondary | ICD-10-CM | POA: Diagnosis not present

## 2018-10-19 DIAGNOSIS — G4733 Obstructive sleep apnea (adult) (pediatric): Secondary | ICD-10-CM | POA: Diagnosis not present

## 2018-10-23 ENCOUNTER — Ambulatory Visit: Payer: Medicaid Other | Admitting: Infectious Diseases

## 2018-10-23 DIAGNOSIS — J449 Chronic obstructive pulmonary disease, unspecified: Secondary | ICD-10-CM | POA: Diagnosis not present

## 2018-10-23 DIAGNOSIS — G4733 Obstructive sleep apnea (adult) (pediatric): Secondary | ICD-10-CM | POA: Diagnosis not present

## 2018-11-08 DIAGNOSIS — G4733 Obstructive sleep apnea (adult) (pediatric): Secondary | ICD-10-CM | POA: Diagnosis not present

## 2018-11-08 DIAGNOSIS — J449 Chronic obstructive pulmonary disease, unspecified: Secondary | ICD-10-CM | POA: Diagnosis not present

## 2018-11-11 DIAGNOSIS — M25512 Pain in left shoulder: Secondary | ICD-10-CM | POA: Diagnosis not present

## 2018-11-11 DIAGNOSIS — G894 Chronic pain syndrome: Secondary | ICD-10-CM | POA: Diagnosis not present

## 2018-11-11 DIAGNOSIS — M545 Low back pain: Secondary | ICD-10-CM | POA: Diagnosis not present

## 2018-11-11 DIAGNOSIS — M542 Cervicalgia: Secondary | ICD-10-CM | POA: Diagnosis not present

## 2018-11-11 DIAGNOSIS — M25511 Pain in right shoulder: Secondary | ICD-10-CM | POA: Diagnosis not present

## 2018-11-11 DIAGNOSIS — B20 Human immunodeficiency virus [HIV] disease: Secondary | ICD-10-CM | POA: Diagnosis not present

## 2018-11-11 DIAGNOSIS — Z79891 Long term (current) use of opiate analgesic: Secondary | ICD-10-CM | POA: Diagnosis not present

## 2018-11-18 DIAGNOSIS — G4733 Obstructive sleep apnea (adult) (pediatric): Secondary | ICD-10-CM | POA: Diagnosis not present

## 2018-11-18 DIAGNOSIS — J449 Chronic obstructive pulmonary disease, unspecified: Secondary | ICD-10-CM | POA: Diagnosis not present

## 2018-11-19 ENCOUNTER — Other Ambulatory Visit: Payer: Self-pay | Admitting: Thoracic Surgery (Cardiothoracic Vascular Surgery)

## 2018-11-19 DIAGNOSIS — R911 Solitary pulmonary nodule: Secondary | ICD-10-CM

## 2018-11-23 DIAGNOSIS — J449 Chronic obstructive pulmonary disease, unspecified: Secondary | ICD-10-CM | POA: Diagnosis not present

## 2018-11-23 DIAGNOSIS — G4733 Obstructive sleep apnea (adult) (pediatric): Secondary | ICD-10-CM | POA: Diagnosis not present

## 2018-11-25 ENCOUNTER — Ambulatory Visit: Payer: Medicaid Other | Admitting: Pulmonary Disease

## 2018-11-25 ENCOUNTER — Other Ambulatory Visit: Payer: Self-pay

## 2018-11-25 ENCOUNTER — Encounter: Payer: Self-pay | Admitting: Pulmonary Disease

## 2018-11-25 ENCOUNTER — Other Ambulatory Visit: Payer: Self-pay | Admitting: Infectious Diseases

## 2018-11-25 VITALS — BP 122/80 | HR 89 | Temp 98.0°F | Ht 70.0 in | Wt 146.0 lb

## 2018-11-25 DIAGNOSIS — B20 Human immunodeficiency virus [HIV] disease: Secondary | ICD-10-CM

## 2018-11-25 DIAGNOSIS — F17200 Nicotine dependence, unspecified, uncomplicated: Secondary | ICD-10-CM

## 2018-11-25 DIAGNOSIS — A310 Pulmonary mycobacterial infection: Secondary | ICD-10-CM

## 2018-11-25 DIAGNOSIS — J449 Chronic obstructive pulmonary disease, unspecified: Secondary | ICD-10-CM

## 2018-11-25 DIAGNOSIS — Z720 Tobacco use: Secondary | ICD-10-CM

## 2018-11-25 MED ORDER — STIOLTO RESPIMAT 2.5-2.5 MCG/ACT IN AERS: 2.0000 | INHALATION_SPRAY | Freq: Every day | RESPIRATORY_TRACT | 0 refills | Status: DC

## 2018-11-25 MED ORDER — STIOLTO RESPIMAT 2.5-2.5 MCG/ACT IN AERS: 2.0000 | INHALATION_SPRAY | Freq: Every day | RESPIRATORY_TRACT | 6 refills | Status: DC

## 2018-11-25 NOTE — Progress Notes (Signed)
Synopsis: Referred in September 2020 former patient Dr. Lake Bells.  YYQ:MGNOI, Lu Duffel, PA-C  Subjective:   PATIENT ID: Calvin Schroeder GENDER: male DOB: 10/27/1960,  MRN: [id number]  Chief Complaint  Patient presents with   Follow-up    [place] BQ patient. COPD. He reports his breathing has been a little worse. He would like to qualify for oxygen. Using spiriva daily. Albuterol prn. He does not feel like spiriva is helpful. He liked the trelegy better.     58 year old gentleman past medical history of COPD (PFTs 2019 ratio 65 FEV1 61%), emphysema, HIV, MAI.  History of Kaposi's sarcoma.  He initially presented in 2018 was seen by cardiothoracic surgery taken for navigational bronchoscopy biopsies revealed granulomas and cultures grew MAI.  He was started on triple antibiotic regimen at that time he did not tolerate and was reduced to 2 drug regimen azithromycin and ethambutol.  Last seen by Dr. [name] in February 2020 and beginning of September 2020.  Repeat CT imaging with a large complex consolidation with cavitation within the left upper lobe.  However with some improvement.  Unfortunately he continues to smoke approximately 1 pack/day.  Dr. [name] plans on seeing him back in 3 months with repeat CT imaging.  No plans for surgical intervention at this time.  OV 09/23/2018: Patient here today to establish care with new primary pulmonologist.  Patient has been followed by Dr. Lake Bells for several years.  Last seen by Dr. Lake Bells in March 2020.  Patient has severe COPD was started on Trelegy at that time.  Since then has had multiple follow-ups with [name], NP in clinic.  Last seen in August 2020.  Inhaler regimen has subsequently been changed to Symbicort 160, Spiriva 2.5.  Unfortunately still smoking even at this time.  At this point he is doing well.  He feels short of breath at baseline.  This is unchanged from prior.  He has stopped using Symbicort.  He thinks that he does fine with  Spiriva only.  He has been using his albuterol inhaler regularly.  He has been taking his antiretroviral medications and MAC medications regularly.   OV 11/25/2018: Patient seen today for follow-up regarding shortness of breath.  Patient admits last week had an episode that he felt more short of breath.  Unfortunately he is still smoking.  We talked about smoking today in the office.  He has been using his medications.  Last seen by [name] in the office.  Patient was started on Spiriva Respimat which he likes.  He was seen by Dr. [name] with plans for image follow-up of the cavitary lesion.  Additionally has follow-up with Dr. [name] in infectious disease scheduled next month.  Patient denies hemoptysis or significant neck sputum production.    Past Medical History:  Diagnosis Date   Back pain    COPD (chronic obstructive pulmonary disease) (HCC)    emphysema    Dyspnea    with exertion    HIV (human immunodeficiency virus infection) ([place])    Lung mass 06/10/2013   MAI   Pneumonia    11/2016   Tobacco abuse 06/10/2013     Family History  Problem Relation Age of Onset   Cancer Mother        lung ca   Cancer Father        skin ca   Cancer Maternal Uncle        liver ca   Cancer Maternal Grandmother  bladder ca     Past Surgical History:  Procedure Laterality Date   APPENDECTOMY     SHOULDER SURGERY     VIDEO BRONCHOSCOPY WITH ENDOBRONCHIAL NAVIGATION N/A 02/07/2017   Procedure: VIDEO BRONCHOSCOPY WITH ENDOBRONCHIAL NAVIGATION;  Surgeon: [name], MD;  Location: [facility];  Service: Thoracic;  Laterality: N/A;   VIDEO BRONCHOSCOPY WITH ENDOBRONCHIAL ULTRASOUND N/A 02/07/2017   Procedure: VIDEO BRONCHOSCOPY WITH ENDOBRONCHIAL ULTRASOUND;  Surgeon: [name], MD;  Location: [facility];  Service: Thoracic;  Laterality: N/A;    Social History   Socioeconomic History   Marital status: Single    Spouse name: Not on file   Number of  children: Not on file   Years of education: Not on file   Highest education level: Not on file  Occupational History   Not on file  Social Needs   Financial resource strain: Not on file   Food insecurity    Worry: Not on file    Inability: Not on file   Transportation needs    Medical: Not on file    Non-medical: Not on file  Tobacco Use   Smoking status: Current Every Day Smoker    Packs/day: 1.00    Years: 40.00    Pack years: 40.00   Smokeless tobacco: Never Used   Tobacco comment: 1 pack per day currently, quit date set for 12/23/17 per pt   Substance and Sexual Activity   Alcohol use: Yes    Comment: daily - 3-4 alcohol   Drug use: Yes    Types: Marijuana    Comment: daily    Sexual activity: Not on file  Lifestyle   Physical activity    Days per week: Not on file    Minutes per session: Not on file   Stress: Not on file  Relationships   Social connections    Talks on phone: Not on file    Gets together: Not on file    Attends religious service: Not on file    Active member of club or organization: Not on file    Attends meetings of clubs or organizations: Not on file    Relationship status: Not on file   Intimate partner violence    Fear of current or ex partner: Not on file    Emotionally abused: Not on file    Physically abused: Not on file    Forced sexual activity: Not on file  Other Topics Concern   Not on file  Social History Narrative   Not on file     Allergies  Allergen Reactions   Rifampin     Reports he could not function for 4 days      Outpatient Medications Prior to Visit  Medication Sig Dispense Refill   albuterol (VENTOLIN HFA) 108 (90 Base) MCG/ACT inhaler Inhale 2 puffs into the lungs every 6 (six) hours as needed for wheezing or shortness of breath. 8 g 5   azithromycin (ZITHROMAX) 600 MG tablet TAKE 1 TABLET(600 MG) BY MOUTH DAILY 30 tablet 3   diphenoxylate-atropine (LOMOTIL) 2.5-0.025 MG tablet TAKE 1 BY  MOUTH FOUR TIMES DAILY AS NEEDED FOR DIARRHEA 30 tablet 0   dolutegravir (TIVICAY) 50 MG tablet Take 1 tablet (50 mg total) by mouth daily. 30 tablet 3   emtricitabine-tenofovir AF (DESCOVY) 200-25 MG tablet Take 1 tablet by mouth daily. 30 tablet 3   ethambutol (MYAMBUTOL) 400 MG tablet TAKE 2 AND 1/2 TABLETS(1000 MG) BY MOUTH DAILY 225 tablet 3  ipratropium-albuterol (DUONEB) 0.5-2.5 (3) MG/3ML SOLN Take 3 mLs by nebulization every 6 (six) hours as needed. 360 mL 5   Tiotropium Bromide Monohydrate (SPIRIVA RESPIMAT) 2.5 MCG/ACT AERS Inhale 2 puffs into the lungs daily. 4 g 6   No facility-administered medications prior to visit.     Review of Systems  Constitutional: Negative for chills, fever, malaise/fatigue and weight loss.  HENT: Negative for hearing loss, sore throat and tinnitus.   Eyes: Negative for blurred vision and double vision.  Respiratory: Positive for cough and shortness of breath. Negative for hemoptysis, sputum production, wheezing and stridor.   Cardiovascular: Negative for chest pain, palpitations, orthopnea, leg swelling and PND.  Gastrointestinal: Negative for abdominal pain, constipation, diarrhea, heartburn, nausea and vomiting.  Genitourinary: Negative for dysuria, hematuria and urgency.  Musculoskeletal: Negative for joint pain and myalgias.  Skin: Negative for itching and rash.  Neurological: Negative for dizziness, tingling, weakness and headaches.  Endo/Heme/Allergies: Negative for environmental allergies. Does not bruise/bleed easily.  Psychiatric/Behavioral: Negative for depression. The patient is not nervous/anxious and does not have insomnia.   All other systems reviewed and are negative.    Objective:  Physical Exam Vitals signs reviewed.  Constitutional:      General: He is not in acute distress.    Appearance: He is well-developed.  HENT:     Head: Normocephalic and atraumatic.     Mouth/Throat:     Pharynx: No oropharyngeal exudate.    Eyes:     Conjunctiva/sclera: Conjunctivae normal.     Pupils: Pupils are equal, round, and reactive to light.  Neck:     Vascular: No JVD.     Trachea: No tracheal deviation.     Comments: Loss of supraclavicular fat Cardiovascular:     Rate and Rhythm: Normal rate and regular rhythm.     Heart sounds: S1 normal and S2 normal.     Comments: Distant heart tones Pulmonary:     Effort: No tachypnea or accessory muscle usage.     Breath sounds: No stridor. Decreased breath sounds (throughout all lung fields) present. No wheezing, rhonchi or rales.  Abdominal:     General: Bowel sounds are normal. There is no distension.     Palpations: Abdomen is soft.     Tenderness: There is no abdominal tenderness.  Musculoskeletal:        General: Deformity (muscle wasting ) present.  Skin:    General: Skin is warm and dry.     Capillary Refill: Capillary refill takes less than 2 seconds.     Findings: No rash.  Neurological:     Mental Status: He is alert and oriented to person, place, and time.  Psychiatric:        Behavior: Behavior normal.      Vitals:   11/25/18 1010  BP: 122/80  Pulse: 89  Temp: 98 F (36.7 C)  TempSrc: Temporal  SpO2: 96%  Weight: 146 lb (66.2 kg)  Height: _0  (1.778 m)   96% on RA BMI Readings from Last 3 Encounters:  11/25/18 20.95 kg/m  09/23/18 20.72 kg/m  09/08/18 20.37 kg/m   Wt Readings from Last 3 Encounters:  11/25/18 146 lb (66.2 kg)  09/23/18 144 lb 6.4 oz (65.5 kg)  09/08/18 142 lb (64.4 kg)     CBC    Component Value Date/Time   WBC 5.6 03/16/2018 0842   RBC 4.11 (L) 03/16/2018 0842   HGB 14.5 03/16/2018 0842   HGB 14.3 12/10/2016 1042  HCT 40.7 03/16/2018 0842   HCT 42.1 12/10/2016 1042   PLT 279 03/16/2018 0842   PLT 121 (L) 12/10/2016 1042   MCV 99.0 03/16/2018 0842   MCV 98.9 (H) 12/10/2016 1042   MCH 35.3 (H) 03/16/2018 0842   MCHC 35.6 03/16/2018 0842   RDW 12.3 03/16/2018 0842   RDW 12.6 12/10/2016 1042    LYMPHSABS 0.9 02/05/2018 1735   LYMPHSABS 0.7 (L) 12/10/2016 1042   MONOABS 0.7 02/05/2018 1735   MONOABS 0.4 12/10/2016 1042   EOSABS 0.0 02/05/2018 1735   EOSABS 0.1 12/10/2016 1042   BASOSABS 0.0 02/05/2018 1735   BASOSABS 0.0 12/10/2016 1042    Chest Imaging: 08/24/2018 CT chest: Extensive emphysematous change with bronchiectasis nodular lesions with dominant cavitary lesion within the left upper lobe. The patient's images have been independently reviewed by me.    Pulmonary Functions Testing Results: PFT Results Latest Ref Rng & Units 06/11/2017  FVC-Pre L 3.63  FVC-Predicted Pre % 73  Pre FEV1/FVC % % 65  FEV1-Pre L 2.34  FEV1-Predicted Pre % 61    FeNO: None  Pathology:   February 2019 bronchoscopy-Dr. [name] Tissue with granulomatous inflammation, some giant cells Negative for malignancy  Cultures: Bronchoscopy February 2019+ for Mycobacterium avium complex: Pansensitive  Echocardiogram: None   Heart Catheterization: None     Assessment & Plan:     ICD-10-CM   1. 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 HIV infection, unspecified symptom status ([place])  B20   3. AIDS (acquired immune deficiency syndrome) ([place])  B20   4. Stage 2 moderate COPD by GOLD classification ([place])  J44.9   5. Current smoker  F17.200   6. Tobacco abuse  Z72.0   7. Tobacco use  Z72.0     Discussion:  58 year old gentleman, severe upper lobe emphysema, moderate COPD, large left-sided cavitary lesion that has been followed for some time.  Has underwent bronchoscopy by Dr. [name].  Also had nodal biopsy of the axilla.  Imaging and cultures consistent with MAI.  He was initially started on triple therapy, rifampin had to be stopped due to reaction, currently on azithromycin plus ethambutol by Dr. [name].  Currently we are following the upper lobe imaging.  Has a repeat 25-monthfollow-up image scheduled by Dr. HRoxan Hockeyand follow-up with him.  As for the  patient's COPD management we will place him on Stiolto today. Patient is unfortunately continue to smoke. Patient was counseled on smoking cessation today in the office. We recommended to make sure he keeps his follow-up appointments with surgery and ID. Continue albuterol as needed for shortness of breath and wheezing.  Return to clinic in 6 months or as needed if symptoms worsen.  Greater than 50% of this patient 25-minute office was in face-to-face discussing above recommendation treatment plan.    Current Outpatient Medications:    albuterol (VENTOLIN HFA) 108 (90 Base) MCG/ACT inhaler, Inhale 2 puffs into the lungs every 6 (six) hours as needed for wheezing or shortness of breath., Disp: 8 g, Rfl: 5   azithromycin (ZITHROMAX) 600 MG tablet, TAKE 1 TABLET(600 MG) BY MOUTH DAILY, Disp: 30 tablet, Rfl: 3   diphenoxylate-atropine (LOMOTIL) 2.5-0.025 MG tablet, TAKE 1 BY MOUTH FOUR TIMES DAILY AS NEEDED FOR DIARRHEA, Disp: 30 tablet, Rfl: 0   dolutegravir (TIVICAY) 50 MG tablet, Take 1 tablet (50 mg total) by mouth daily., Disp: 30 tablet, Rfl: 3   emtricitabine-tenofovir AF (DESCOVY) 200-25 MG tablet, Take 1 tablet by mouth daily., Disp: 30  tablet, Rfl: 3   ethambutol (MYAMBUTOL) 400 MG tablet, TAKE 2 AND 1/2 TABLETS(1000 MG) BY MOUTH DAILY, Disp: 225 tablet, Rfl: 3   ipratropium-albuterol (DUONEB) 0.5-2.5 (3) MG/3ML SOLN, Take 3 mLs by nebulization every 6 (six) hours as needed., Disp: 360 mL, Rfl: 5   Tiotropium Bromide Monohydrate (SPIRIVA RESPIMAT) 2.5 MCG/ACT AERS, Inhale 2 puffs into the lungs daily., Disp: 4 g, Rfl: 6   Tiotropium Bromide-Olodaterol (STIOLTO RESPIMAT) 2.5-2.5 MCG/ACT AERS, Inhale 2 puffs into the lungs daily., Disp: 4 g, Rfl: 0   Garner Nash, DO [facility] 11/25/2018 10:34 AM

## 2018-11-25 NOTE — Patient Instructions (Signed)
Thank you for visiting Dr. Valeta Harms at [facility]. Today we recommend the following:  Stiolto samples today  Stop spiriva respimat.   Return in about 6 months (around 05/25/2019).    Please do your part to reduce the spread of COVID-19.

## 2018-12-08 DIAGNOSIS — G4733 Obstructive sleep apnea (adult) (pediatric): Secondary | ICD-10-CM | POA: Diagnosis not present

## 2018-12-08 DIAGNOSIS — J449 Chronic obstructive pulmonary disease, unspecified: Secondary | ICD-10-CM | POA: Diagnosis not present

## 2018-12-09 ENCOUNTER — Telehealth: Payer: Self-pay

## 2018-12-09 DIAGNOSIS — M25511 Pain in right shoulder: Secondary | ICD-10-CM | POA: Diagnosis not present

## 2018-12-09 DIAGNOSIS — Z79891 Long term (current) use of opiate analgesic: Secondary | ICD-10-CM | POA: Diagnosis not present

## 2018-12-09 DIAGNOSIS — M545 Low back pain: Secondary | ICD-10-CM | POA: Diagnosis not present

## 2018-12-09 DIAGNOSIS — M542 Cervicalgia: Secondary | ICD-10-CM | POA: Diagnosis not present

## 2018-12-09 DIAGNOSIS — G894 Chronic pain syndrome: Secondary | ICD-10-CM | POA: Diagnosis not present

## 2018-12-09 DIAGNOSIS — B20 Human immunodeficiency virus [HIV] disease: Secondary | ICD-10-CM | POA: Diagnosis not present

## 2018-12-09 DIAGNOSIS — M25512 Pain in left shoulder: Secondary | ICD-10-CM | POA: Diagnosis not present

## 2018-12-09 NOTE — Telephone Encounter (Signed)
COVID-19 Pre-Screening Questions:12/09/18  Do you currently have a fever (>100 F), chills or unexplained body aches? NO  Are you currently experiencing new cough, shortness of breath, sore throat, runny nose?NO  .  Have you recently travelled outside the state of [place] in the last 14 days? NO .  Have you been in contact with someone that is currently pending confirmation of Covid19 testing or has been confirmed to have the [place] virus?  NO  **If the patient answers NO to ALL questions -  advise the patient to please call the clinic before coming to the office should any symptoms develop.

## 2018-12-10 ENCOUNTER — Other Ambulatory Visit: Payer: Medicaid Other

## 2018-12-10 ENCOUNTER — Other Ambulatory Visit: Payer: Self-pay

## 2018-12-10 DIAGNOSIS — Z113 Encounter for screening for infections with a predominantly sexual mode of transmission: Secondary | ICD-10-CM | POA: Diagnosis not present

## 2018-12-10 DIAGNOSIS — B20 Human immunodeficiency virus [HIV] disease: Secondary | ICD-10-CM | POA: Diagnosis not present

## 2018-12-10 DIAGNOSIS — Z79899 Other long term (current) drug therapy: Secondary | ICD-10-CM

## 2018-12-11 LAB — T-HELPER CELL (CD4) - (RCID CLINIC ONLY)
CD4 % Helper T Cell: 24 % — ABNORMAL LOW (ref 33–65)
CD4 T Cell Abs: 256 /uL — ABNORMAL LOW (ref 400–1790)

## 2018-12-18 DIAGNOSIS — G4733 Obstructive sleep apnea (adult) (pediatric): Secondary | ICD-10-CM | POA: Diagnosis not present

## 2018-12-18 DIAGNOSIS — J449 Chronic obstructive pulmonary disease, unspecified: Secondary | ICD-10-CM | POA: Diagnosis not present

## 2018-12-20 LAB — CBC
HCT: 44.7 % (ref 38.5–50.0)
Hemoglobin: 15.6 g/dL (ref 13.2–17.1)
MCH: 35.9 pg — ABNORMAL HIGH (ref 27.0–33.0)
MCHC: 34.9 g/dL (ref 32.0–36.0)
MCV: 102.8 fL — ABNORMAL HIGH (ref 80.0–100.0)
MPV: 9.4 fL (ref 7.5–12.5)
Platelets: 230 10*3/uL (ref 140–400)
RBC: 4.35 10*6/uL (ref 4.20–5.80)
RDW: 11.8 % (ref 11.0–15.0)
WBC: 4.8 10*3/uL (ref 3.8–10.8)

## 2018-12-20 LAB — COMPREHENSIVE METABOLIC PANEL
AG Ratio: 1.3 (calc) (ref 1.0–2.5)
ALT: 11 U/L (ref 9–46)
AST: 14 U/L (ref 10–35)
Albumin: 4.3 g/dL (ref 3.6–5.1)
Alkaline phosphatase (APISO): 71 U/L (ref 35–144)
BUN: 12 mg/dL (ref 7–25)
CO2: 26 mmol/L (ref 20–32)
Calcium: 9.1 mg/dL (ref 8.6–10.3)
Chloride: 105 mmol/L (ref 98–110)
Creat: 0.9 mg/dL (ref 0.70–1.33)
Globulin: 3.3 g/dL (calc) (ref 1.9–3.7)
Glucose, Bld: 95 mg/dL (ref 65–99)
Potassium: 4.2 mmol/L (ref 3.5–5.3)
Sodium: 140 mmol/L (ref 135–146)
Total Bilirubin: 0.4 mg/dL (ref 0.2–1.2)
Total Protein: 7.6 g/dL (ref 6.1–8.1)

## 2018-12-20 LAB — LIPID PANEL
Cholesterol: 164 mg/dL (ref <200)
HDL: 77 mg/dL (ref >=40)
LDL Cholesterol (Calc): 75 mg/dL (calc)
Non-HDL Cholesterol (Calc): 87 mg/dL (calc) (ref <130)
Total CHOL/HDL Ratio: 2.1 (calc) (ref <5.0)
Triglycerides: 42 mg/dL (ref <150)

## 2018-12-20 LAB — RPR: RPR Ser Ql: NONREACTIVE

## 2018-12-20 LAB — HIV-1 RNA QUANT-NO REFLEX-BLD
HIV 1 RNA Quant: 63 copies/mL — ABNORMAL HIGH
HIV-1 RNA Quant, Log: 1.8 Log copies/mL — ABNORMAL HIGH

## 2018-12-23 ENCOUNTER — Ambulatory Visit
Admission: RE | Admit: 2018-12-23 | Discharge: 2018-12-23 | Disposition: A | Discharge status: Home / Self Care | Payer: Medicaid Other | Source: Ambulatory Visit | Attending: Thoracic Surgery (Cardiothoracic Vascular Surgery) | Admitting: Thoracic Surgery (Cardiothoracic Vascular Surgery)

## 2018-12-23 DIAGNOSIS — J449 Chronic obstructive pulmonary disease, unspecified: Secondary | ICD-10-CM | POA: Diagnosis not present

## 2018-12-23 DIAGNOSIS — R918 Other nonspecific abnormal finding of lung field: Secondary | ICD-10-CM | POA: Diagnosis not present

## 2018-12-23 DIAGNOSIS — R911 Solitary pulmonary nodule: Secondary | ICD-10-CM

## 2018-12-23 DIAGNOSIS — G4733 Obstructive sleep apnea (adult) (pediatric): Secondary | ICD-10-CM | POA: Diagnosis not present

## 2018-12-24 ENCOUNTER — Other Ambulatory Visit: Payer: Self-pay

## 2018-12-25 ENCOUNTER — Ambulatory Visit (INDEPENDENT_AMBULATORY_CARE_PROVIDER_SITE_OTHER): Payer: Medicaid Other | Admitting: Infectious Diseases

## 2018-12-25 ENCOUNTER — Encounter: Payer: Medicaid Other | Admitting: Infectious Diseases

## 2018-12-25 ENCOUNTER — Other Ambulatory Visit: Payer: Self-pay

## 2018-12-25 ENCOUNTER — Encounter: Payer: Self-pay | Admitting: Infectious Diseases

## 2018-12-25 VITALS — BP 152/95 | HR 70 | Wt 144.0 lb

## 2018-12-25 DIAGNOSIS — C469 Kaposi's sarcoma, unspecified: Secondary | ICD-10-CM | POA: Diagnosis not present

## 2018-12-25 DIAGNOSIS — Z113 Encounter for screening for infections with a predominantly sexual mode of transmission: Secondary | ICD-10-CM

## 2018-12-25 DIAGNOSIS — I10 Essential (primary) hypertension: Secondary | ICD-10-CM | POA: Diagnosis not present

## 2018-12-25 DIAGNOSIS — B20 Human immunodeficiency virus [HIV] disease: Secondary | ICD-10-CM | POA: Diagnosis not present

## 2018-12-25 DIAGNOSIS — Z79899 Other long term (current) drug therapy: Secondary | ICD-10-CM

## 2018-12-25 DIAGNOSIS — J439 Emphysema, unspecified: Secondary | ICD-10-CM | POA: Diagnosis not present

## 2018-12-25 NOTE — Assessment & Plan Note (Signed)
He is doing well.  He is realizing he is stable.  Has had flu vax Offered/refused condoms.  Could consider biktarvy.  Needs PNVx 23, Shingles vax.  He asks about COVID.  rtc in 9 months.

## 2018-12-25 NOTE — Assessment & Plan Note (Signed)
Stable.  Appreciate Dr Juline Patch f/u.  Continues on inhalers.

## 2018-12-25 NOTE — Progress Notes (Signed)
   Subjective:    Patient ID: Calvin Schroeder, male    DOB: December 09, 1960, 58 y.o.   MRN: [id number]  HPI 58yo M previously found to have cystic lesion on chest CT on adm for PNA. 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 He was seen in [place] found to have AIDS.  He was started on E/A/R however developed n/v (that persisted despite zofran) He was started on tivicay-truvada. He has had f/u with pulm for COPD. He has also f/u with CVTS for a cyctic mass in LUL.  He had f/u CT scan 12-2018: 1. Diffuse bronchial wall thickening with emphysema, as above; imaging findings suggestive of underlying COPD. 2. Large cavitary lesion within the left upper lobe is again noted. This is slightly decreased in size when compared with the previous exam. 3. Multiple solid-appearing nodules scattered throughout both lungs are again noted and appear stable. No new or progressive nodularity identified. 4. Aortic Atherosclerosis (ICD10-I70.0) and Emphysema (ICD10-J43.9). 5. Coronary artery calcifications. He has also been seen by Onc for [place].  RestartedMAI rx 11-05-17. He states he took this starting in Dec.  Has CVTS f/u on 12-29-18.   He has had no "flare ups" with his breathing. Has been feeling well.  Minimal cough. No hemoptysis. Feeling as good as I have in 2 years!  No problems with his ART.  "these pills give me diarrhea"  Review of Systems  Constitutional: Negative for appetite change and unexpected weight change.  Eyes: Negative for visual disturbance.  Respiratory: Positive for cough. Negative for shortness of breath.   Cardiovascular: Negative for chest pain.  Gastrointestinal: Positive for diarrhea. Negative for constipation.  Genitourinary: Negative for difficulty urinating.  Neurological: Negative for headaches.  Please see HPI. All other systems reviewed and negative.      Objective:   Physical Exam Vitals reviewed.  Constitutional:      Appearance: Normal appearance. He is not  ill-appearing.  HENT:     Mouth/Throat:     Mouth: Mucous membranes are moist.     Pharynx: Oropharynx is clear.  Eyes:     Extraocular Movements: Extraocular movements intact.     Pupils: Pupils are equal, round, and reactive to light.  Cardiovascular:     Rate and Rhythm: Normal rate and regular rhythm.  Pulmonary:     Effort: Pulmonary effort is normal.     Breath sounds: Normal breath sounds.  Abdominal:     General: Bowel sounds are normal. There is no distension.     Palpations: Abdomen is soft.     Tenderness: There is no abdominal tenderness.  Musculoskeletal:        General: No swelling.     Cervical back: Normal range of motion and neck supple.     Right lower leg: No edema.     Left lower leg: No edema.  Neurological:     Mental Status: He is alert.  Psychiatric:        Mood and Affect: Mood normal.           Assessment & Plan:

## 2018-12-25 NOTE — Assessment & Plan Note (Signed)
He is doing well.  asx Will have him f/u with PCP.

## 2018-12-25 NOTE — Assessment & Plan Note (Signed)
States he has 3 residual lesions which are getting smaller.  Offered to send him to onc which he defers.

## 2018-12-29 ENCOUNTER — Ambulatory Visit: Payer: Medicaid Other | Admitting: Thoracic Surgery (Cardiothoracic Vascular Surgery)

## 2018-12-30 ENCOUNTER — Telehealth (INDEPENDENT_AMBULATORY_CARE_PROVIDER_SITE_OTHER): Payer: Medicaid Other | Admitting: Thoracic Surgery (Cardiothoracic Vascular Surgery)

## 2018-12-30 ENCOUNTER — Other Ambulatory Visit: Payer: Self-pay

## 2018-12-30 DIAGNOSIS — A310 Pulmonary mycobacterial infection: Secondary | ICD-10-CM | POA: Diagnosis not present

## 2018-12-30 DIAGNOSIS — J984 Other disorders of lung: Secondary | ICD-10-CM

## 2018-12-30 NOTE — Progress Notes (Signed)
[address]       [place] [address]             [phone]       This visit is being conducted by telephone after in person visit canceled due to scheduling.  Conducted by telephone at patient request.  Calvin Schroeder is a 58 year old man with a history of tobacco abuse, COPD, pulmonary MAI, HIV, Kaposi's sarcoma, and chronic back pain.  He presented in the fall 2018 with respiratory issues.  CT of the chest showed a complex consolidative process in the left upper lobe.  Navigational bronchoscopy showed granulomas and cultures grew MAI.  He initially was started on triple antibiotic therapy but converted to azithromycin and ethambutol.  We have been following him since then because of concern about the possibility of an underlying malignancy.  He continues to smoke a pack a day.  He says he feels better than he has in a year and a half.  He recently started seeing Calvin Schroeder from pulmonary and continues to see Dr. Johnnye Schroeder of ID.  CT CHEST WITHOUT CONTRAST  TECHNIQUE: Multidetector CT imaging of the chest was performed following the standard protocol without IV contrast.  COMPARISON:  08/24/2018  FINDINGS: Cardiovascular: The heart size appears within normal limits. There is no pericardial effusion identified. Calcification in the LAD coronary artery identified.  Mediastinum/Nodes: Normal appearance of the thyroid gland. The trachea appears patent and is midline. Normal appearance of the esophagus. No enlarged mediastinal or hilar lymph nodes. No supraclavicular or axillary adenopathy.  Lungs/Pleura: Diffuse bronchial wall thickening, centrilobular and paraseptal emphysema.  The cavitary lesion within the left upper lobe is again identified. This measures 8.0 x 2.9 by 3.2 cm (volume = 39 cm^3). Previously this measured 8.0 by 3.3 by 3.7 cm (volume = 51 cm^3).  Within the posterior right upper lobe there is a solid nodule measuring 1.2 cm,  image 87/8. Unchanged from previous exam.  Also in the right upper lobe is a 1.5 cm nodule, image 71/8. Previously 1.7 cm.  Scattered left upper lobe lung nodules all measuring 5 mm or less extending into the lingula are unchanged from the previous study.  In the left lower lobe there is a stable nodule measuring 7 mm, image 132/8.  Upper Abdomen: No acute findings.  Musculoskeletal: There is no aggressive lytic or sclerotic bone lesion.  IMPRESSION: 1. Diffuse bronchial wall thickening with emphysema, as above; imaging findings suggestive of underlying COPD. 2. Large cavitary lesion within the left upper lobe is again noted. This is slightly decreased in size when compared with the previous exam. 3. Multiple solid-appearing nodules scattered throughout both lungs are again noted and appear stable. No new or progressive nodularity identified. 4. Aortic Atherosclerosis (ICD10-I70.0) and Emphysema (ICD10-J43.9). 5. Coronary artery calcifications.   Electronically Signed   By: Kerby Moors M.D.   On: 12/23/2018 12:33 I personally reviewed the CT images and concur with the findings noted above.  Impression Calvin Schroeder is a 58 year old man with a history of tobacco abuse, COPD, HIV, Kaposi's sarcoma, Mycobacterium avium intracellulare complex pulmonary infection, cavitary lung mass and multiple other lung nodules.  Cavitary lung mass-biopsy showed granuloma and cultures grew MAI.  He was treated with azithromycin and ethambutol for that.  On CT that area is stable to slightly decreased in size.  He has multiple other nodules bilaterally all of which are stable or slightly improved.  Most likely MAI but continuing to follow  to the risk of an underlying malignancy given his history of tobacco abuse.  Tobacco abuse-continues to smoke a pack a day.  He has stated on multiple occasions that he has no interest in quitting.  Plan Return in 6 months with CT chest

## 2019-01-06 DIAGNOSIS — M25511 Pain in right shoulder: Secondary | ICD-10-CM | POA: Diagnosis not present

## 2019-01-06 DIAGNOSIS — M542 Cervicalgia: Secondary | ICD-10-CM | POA: Diagnosis not present

## 2019-01-06 DIAGNOSIS — Z79891 Long term (current) use of opiate analgesic: Secondary | ICD-10-CM | POA: Diagnosis not present

## 2019-01-06 DIAGNOSIS — M25512 Pain in left shoulder: Secondary | ICD-10-CM | POA: Diagnosis not present

## 2019-01-06 DIAGNOSIS — G894 Chronic pain syndrome: Secondary | ICD-10-CM | POA: Diagnosis not present

## 2019-01-06 DIAGNOSIS — M545 Low back pain: Secondary | ICD-10-CM | POA: Diagnosis not present

## 2019-01-06 DIAGNOSIS — B20 Human immunodeficiency virus [HIV] disease: Secondary | ICD-10-CM | POA: Diagnosis not present

## 2019-01-08 DIAGNOSIS — J449 Chronic obstructive pulmonary disease, unspecified: Secondary | ICD-10-CM | POA: Diagnosis not present

## 2019-01-08 DIAGNOSIS — G4733 Obstructive sleep apnea (adult) (pediatric): Secondary | ICD-10-CM | POA: Diagnosis not present

## 2019-01-23 DIAGNOSIS — J449 Chronic obstructive pulmonary disease, unspecified: Secondary | ICD-10-CM | POA: Diagnosis not present

## 2019-01-23 DIAGNOSIS — G4733 Obstructive sleep apnea (adult) (pediatric): Secondary | ICD-10-CM | POA: Diagnosis not present

## 2019-02-03 DIAGNOSIS — M545 Low back pain: Secondary | ICD-10-CM | POA: Diagnosis not present

## 2019-02-03 DIAGNOSIS — B20 Human immunodeficiency virus [HIV] disease: Secondary | ICD-10-CM | POA: Diagnosis not present

## 2019-02-03 DIAGNOSIS — G894 Chronic pain syndrome: Secondary | ICD-10-CM | POA: Diagnosis not present

## 2019-02-03 DIAGNOSIS — M25511 Pain in right shoulder: Secondary | ICD-10-CM | POA: Diagnosis not present

## 2019-02-03 DIAGNOSIS — M25512 Pain in left shoulder: Secondary | ICD-10-CM | POA: Diagnosis not present

## 2019-02-03 DIAGNOSIS — M542 Cervicalgia: Secondary | ICD-10-CM | POA: Diagnosis not present

## 2019-02-03 DIAGNOSIS — Z79891 Long term (current) use of opiate analgesic: Secondary | ICD-10-CM | POA: Diagnosis not present

## 2019-02-08 DIAGNOSIS — J449 Chronic obstructive pulmonary disease, unspecified: Secondary | ICD-10-CM | POA: Diagnosis not present

## 2019-02-08 DIAGNOSIS — G4733 Obstructive sleep apnea (adult) (pediatric): Secondary | ICD-10-CM | POA: Diagnosis not present

## 2019-02-14 ENCOUNTER — Other Ambulatory Visit: Payer: Self-pay | Admitting: Infectious Diseases

## 2019-02-14 DIAGNOSIS — A310 Pulmonary mycobacterial infection: Secondary | ICD-10-CM

## 2019-02-16 DIAGNOSIS — G4733 Obstructive sleep apnea (adult) (pediatric): Secondary | ICD-10-CM | POA: Diagnosis not present

## 2019-02-16 DIAGNOSIS — J449 Chronic obstructive pulmonary disease, unspecified: Secondary | ICD-10-CM | POA: Diagnosis not present

## 2019-02-17 ENCOUNTER — Other Ambulatory Visit: Payer: Self-pay | Admitting: Infectious Diseases

## 2019-02-17 DIAGNOSIS — B20 Human immunodeficiency virus [HIV] disease: Secondary | ICD-10-CM

## 2019-02-23 DIAGNOSIS — J449 Chronic obstructive pulmonary disease, unspecified: Secondary | ICD-10-CM | POA: Diagnosis not present

## 2019-02-23 DIAGNOSIS — G4733 Obstructive sleep apnea (adult) (pediatric): Secondary | ICD-10-CM | POA: Diagnosis not present

## 2019-03-03 DIAGNOSIS — M545 Low back pain: Secondary | ICD-10-CM | POA: Diagnosis not present

## 2019-03-03 DIAGNOSIS — Z79891 Long term (current) use of opiate analgesic: Secondary | ICD-10-CM | POA: Diagnosis not present

## 2019-03-03 DIAGNOSIS — B20 Human immunodeficiency virus [HIV] disease: Secondary | ICD-10-CM | POA: Diagnosis not present

## 2019-03-03 DIAGNOSIS — M25512 Pain in left shoulder: Secondary | ICD-10-CM | POA: Diagnosis not present

## 2019-03-03 DIAGNOSIS — G894 Chronic pain syndrome: Secondary | ICD-10-CM | POA: Diagnosis not present

## 2019-03-03 DIAGNOSIS — M542 Cervicalgia: Secondary | ICD-10-CM | POA: Diagnosis not present

## 2019-03-03 DIAGNOSIS — M25511 Pain in right shoulder: Secondary | ICD-10-CM | POA: Diagnosis not present

## 2019-03-08 DIAGNOSIS — J449 Chronic obstructive pulmonary disease, unspecified: Secondary | ICD-10-CM | POA: Diagnosis not present

## 2019-03-08 DIAGNOSIS — G4733 Obstructive sleep apnea (adult) (pediatric): Secondary | ICD-10-CM | POA: Diagnosis not present

## 2019-03-18 DIAGNOSIS — G4733 Obstructive sleep apnea (adult) (pediatric): Secondary | ICD-10-CM | POA: Diagnosis not present

## 2019-03-18 DIAGNOSIS — J449 Chronic obstructive pulmonary disease, unspecified: Secondary | ICD-10-CM | POA: Diagnosis not present

## 2019-03-23 DIAGNOSIS — J449 Chronic obstructive pulmonary disease, unspecified: Secondary | ICD-10-CM | POA: Diagnosis not present

## 2019-03-23 DIAGNOSIS — G4733 Obstructive sleep apnea (adult) (pediatric): Secondary | ICD-10-CM | POA: Diagnosis not present

## 2019-03-31 DIAGNOSIS — M545 Low back pain: Secondary | ICD-10-CM | POA: Diagnosis not present

## 2019-03-31 DIAGNOSIS — M542 Cervicalgia: Secondary | ICD-10-CM | POA: Diagnosis not present

## 2019-03-31 DIAGNOSIS — Z79891 Long term (current) use of opiate analgesic: Secondary | ICD-10-CM | POA: Diagnosis not present

## 2019-03-31 DIAGNOSIS — G894 Chronic pain syndrome: Secondary | ICD-10-CM | POA: Diagnosis not present

## 2019-03-31 DIAGNOSIS — M25512 Pain in left shoulder: Secondary | ICD-10-CM | POA: Diagnosis not present

## 2019-03-31 DIAGNOSIS — M25511 Pain in right shoulder: Secondary | ICD-10-CM | POA: Diagnosis not present

## 2019-03-31 DIAGNOSIS — B20 Human immunodeficiency virus [HIV] disease: Secondary | ICD-10-CM | POA: Diagnosis not present

## 2019-04-08 DIAGNOSIS — G4733 Obstructive sleep apnea (adult) (pediatric): Secondary | ICD-10-CM | POA: Diagnosis not present

## 2019-04-08 DIAGNOSIS — J449 Chronic obstructive pulmonary disease, unspecified: Secondary | ICD-10-CM | POA: Diagnosis not present

## 2019-04-22 DIAGNOSIS — J449 Chronic obstructive pulmonary disease, unspecified: Secondary | ICD-10-CM | POA: Diagnosis not present

## 2019-04-22 DIAGNOSIS — G4733 Obstructive sleep apnea (adult) (pediatric): Secondary | ICD-10-CM | POA: Diagnosis not present

## 2019-04-23 DIAGNOSIS — J449 Chronic obstructive pulmonary disease, unspecified: Secondary | ICD-10-CM | POA: Diagnosis not present

## 2019-04-23 DIAGNOSIS — G4733 Obstructive sleep apnea (adult) (pediatric): Secondary | ICD-10-CM | POA: Diagnosis not present

## 2019-04-27 DIAGNOSIS — Z79891 Long term (current) use of opiate analgesic: Secondary | ICD-10-CM | POA: Diagnosis not present

## 2019-04-27 DIAGNOSIS — B20 Human immunodeficiency virus [HIV] disease: Secondary | ICD-10-CM | POA: Diagnosis not present

## 2019-04-27 DIAGNOSIS — M542 Cervicalgia: Secondary | ICD-10-CM | POA: Diagnosis not present

## 2019-04-27 DIAGNOSIS — M25512 Pain in left shoulder: Secondary | ICD-10-CM | POA: Diagnosis not present

## 2019-04-27 DIAGNOSIS — M545 Low back pain: Secondary | ICD-10-CM | POA: Diagnosis not present

## 2019-04-27 DIAGNOSIS — G894 Chronic pain syndrome: Secondary | ICD-10-CM | POA: Diagnosis not present

## 2019-04-27 DIAGNOSIS — M25511 Pain in right shoulder: Secondary | ICD-10-CM | POA: Diagnosis not present

## 2019-05-08 DIAGNOSIS — G4733 Obstructive sleep apnea (adult) (pediatric): Secondary | ICD-10-CM | POA: Diagnosis not present

## 2019-05-08 DIAGNOSIS — J449 Chronic obstructive pulmonary disease, unspecified: Secondary | ICD-10-CM | POA: Diagnosis not present

## 2019-05-21 ENCOUNTER — Other Ambulatory Visit: Payer: Self-pay | Admitting: Thoracic Surgery (Cardiothoracic Vascular Surgery)

## 2019-05-21 DIAGNOSIS — R918 Other nonspecific abnormal finding of lung field: Secondary | ICD-10-CM

## 2019-05-23 DIAGNOSIS — J449 Chronic obstructive pulmonary disease, unspecified: Secondary | ICD-10-CM | POA: Diagnosis not present

## 2019-05-23 DIAGNOSIS — G4733 Obstructive sleep apnea (adult) (pediatric): Secondary | ICD-10-CM | POA: Diagnosis not present

## 2019-05-25 DIAGNOSIS — M25512 Pain in left shoulder: Secondary | ICD-10-CM | POA: Diagnosis not present

## 2019-05-25 DIAGNOSIS — M545 Low back pain: Secondary | ICD-10-CM | POA: Diagnosis not present

## 2019-05-25 DIAGNOSIS — G894 Chronic pain syndrome: Secondary | ICD-10-CM | POA: Diagnosis not present

## 2019-05-25 DIAGNOSIS — M542 Cervicalgia: Secondary | ICD-10-CM | POA: Diagnosis not present

## 2019-05-25 DIAGNOSIS — B20 Human immunodeficiency virus [HIV] disease: Secondary | ICD-10-CM | POA: Diagnosis not present

## 2019-05-25 DIAGNOSIS — Z79891 Long term (current) use of opiate analgesic: Secondary | ICD-10-CM | POA: Diagnosis not present

## 2019-05-25 DIAGNOSIS — M25511 Pain in right shoulder: Secondary | ICD-10-CM | POA: Diagnosis not present

## 2019-05-28 DIAGNOSIS — G4733 Obstructive sleep apnea (adult) (pediatric): Secondary | ICD-10-CM | POA: Diagnosis not present

## 2019-05-28 DIAGNOSIS — J449 Chronic obstructive pulmonary disease, unspecified: Secondary | ICD-10-CM | POA: Diagnosis not present

## 2019-06-02 DIAGNOSIS — M47812 Spondylosis without myelopathy or radiculopathy, cervical region: Secondary | ICD-10-CM | POA: Diagnosis not present

## 2019-06-02 DIAGNOSIS — M5136 Other intervertebral disc degeneration, lumbar region: Secondary | ICD-10-CM | POA: Diagnosis not present

## 2019-06-02 DIAGNOSIS — M4726 Other spondylosis with radiculopathy, lumbar region: Secondary | ICD-10-CM | POA: Diagnosis not present

## 2019-06-02 DIAGNOSIS — Z79891 Long term (current) use of opiate analgesic: Secondary | ICD-10-CM | POA: Diagnosis not present

## 2019-06-02 DIAGNOSIS — M7918 Myalgia, other site: Secondary | ICD-10-CM | POA: Diagnosis not present

## 2019-06-02 DIAGNOSIS — M5030 Other cervical disc degeneration, unspecified cervical region: Secondary | ICD-10-CM | POA: Diagnosis not present

## 2019-06-02 DIAGNOSIS — M545 Low back pain: Secondary | ICD-10-CM | POA: Diagnosis not present

## 2019-06-02 DIAGNOSIS — M4722 Other spondylosis with radiculopathy, cervical region: Secondary | ICD-10-CM | POA: Diagnosis not present

## 2019-06-02 DIAGNOSIS — M4802 Spinal stenosis, cervical region: Secondary | ICD-10-CM | POA: Diagnosis not present

## 2019-06-02 DIAGNOSIS — G894 Chronic pain syndrome: Secondary | ICD-10-CM | POA: Diagnosis not present

## 2019-06-02 DIAGNOSIS — G8929 Other chronic pain: Secondary | ICD-10-CM | POA: Diagnosis not present

## 2019-06-02 DIAGNOSIS — M48061 Spinal stenosis, lumbar region without neurogenic claudication: Secondary | ICD-10-CM | POA: Diagnosis not present

## 2019-06-14 DIAGNOSIS — H2513 Age-related nuclear cataract, bilateral: Secondary | ICD-10-CM | POA: Diagnosis not present

## 2019-06-14 DIAGNOSIS — H40033 Anatomical narrow angle, bilateral: Secondary | ICD-10-CM | POA: Diagnosis not present

## 2019-06-15 ENCOUNTER — Other Ambulatory Visit: Payer: Self-pay | Admitting: Thoracic Surgery (Cardiothoracic Vascular Surgery)

## 2019-06-16 ENCOUNTER — Ambulatory Visit
Admission: RE | Admit: 2019-06-16 | Discharge: 2019-06-16 | Disposition: A | Discharge status: Home / Self Care | Payer: Medicaid Other | Source: Ambulatory Visit | Attending: Thoracic Surgery (Cardiothoracic Vascular Surgery) | Admitting: Thoracic Surgery (Cardiothoracic Vascular Surgery)

## 2019-06-16 DIAGNOSIS — R918 Other nonspecific abnormal finding of lung field: Secondary | ICD-10-CM | POA: Diagnosis not present

## 2019-06-22 ENCOUNTER — Ambulatory Visit: Payer: Medicaid Other | Admitting: Thoracic Surgery (Cardiothoracic Vascular Surgery)

## 2019-06-22 ENCOUNTER — Other Ambulatory Visit: Payer: Self-pay

## 2019-06-22 ENCOUNTER — Encounter: Payer: Self-pay | Admitting: Thoracic Surgery (Cardiothoracic Vascular Surgery)

## 2019-06-22 VITALS — BP 152/92 | HR 78 | Temp 98.2°F | Ht 70.0 in | Wt 143.6 lb

## 2019-06-22 DIAGNOSIS — R918 Other nonspecific abnormal finding of lung field: Secondary | ICD-10-CM | POA: Diagnosis not present

## 2019-06-22 NOTE — Progress Notes (Signed)
[address]       [place] [address]             [phone]     HPI: Calvin Schroeder returns for a scheduled follow-up visit  Calvin Schroeder is a 59 year old man with a history of tobacco abuse, COPD, pulmonary MAI, HIV, Kaposi's sarcoma, and chronic back pain.  He presented in the fall 2018 with respiratory issues.  On CT of the chest he had a complex consolidative process in the left upper lobe with some cavitation.  Navigational bronchoscopy showed granulomas and cultures grew MAI.  He was started on triple antibiotic initially but could not tolerate rifampin.  He says over the past couple of days he has been having some problems with coughing and wheezing at night.  He has not had any fevers, chills, or sweats.  Past Medical History:  Diagnosis Date  . Back pain   . COPD (chronic obstructive pulmonary disease) (HCC)    emphysema   . Dyspnea    with exertion   . HIV (human immunodeficiency virus infection) ([place])   . 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 Pneumonia    11/2016  . Tobacco abuse 06/10/2013    Current Outpatient Medications  Medication Sig Dispense Refill  . albuterol (VENTOLIN HFA) 108 (90 Base) MCG/ACT inhaler Inhale 2 puffs into the lungs every 6 (six) hours as needed for wheezing or shortness of breath. 8 g 5  . azithromycin (ZITHROMAX) 600 MG tablet TAKE 1 TABLET(600 MG) BY MOUTH DAILY 30 tablet 3  . DESCOVY 200-25 MG tablet TAKE 1 TABLET BY MOUTH DAILY 90 tablet 1  . ethambutol (MYAMBUTOL) 400 MG tablet TAKE 2 AND 1/2 TABLETS(1000 MG) BY MOUTH DAILY 225 tablet 3  . ipratropium-albuterol (DUONEB) 0.5-2.5 (3) MG/3ML SOLN Take 3 mLs by nebulization every 6 (six) hours as needed. 360 mL 5  . meloxicam (MOBIC) 7.5 MG tablet     . Tiotropium Bromide-Olodaterol (STIOLTO RESPIMAT) 2.5-2.5 MCG/ACT AERS Inhale 2 puffs into the lungs daily. 4 g 0  . Tiotropium Bromide-Olodaterol (STIOLTO RESPIMAT) 2.5-2.5 MCG/ACT AERS Inhale 2 puffs into the lungs daily. 4 g  6  . TIVICAY 50 MG tablet TAKE 1 TABLET(50 MG) BY MOUTH DAILY 30 tablet 5   No current facility-administered medications for this visit.    Physical Exam BP (!) 152/92 (BP Location: Left Arm, Patient Position: Sitting, Cuff Size: Normal)   Pulse 78   Temp 98.2 F (36.8 C)   Ht _0  (1.778 m)   Wt 143 lb 9.6 oz (65.1 kg)   SpO2 97% Comment: RA  BMI 20.17 kg/m  59 year old man in no acute distress Alert and oriented x3 no focal deficits Lungs clear bilaterally, no wheezing Cardiac regular rate and rhythm  Diagnostic Tests: CT CHEST WITHOUT CONTRAST  TECHNIQUE: Multidetector CT imaging of the chest was performed following the standard protocol without IV contrast.  COMPARISON:  Chest CT 12/23/2018, 08/24/2018 and 02/09/2018. Other older studies dating back to 11/25/2016 are correlated.  FINDINGS: Cardiovascular: Mild atherosclerosis of the aorta, great vessels and coronary arteries. No acute vascular findings on noncontrast imaging. The heart size is normal. There is no pericardial effusion.  Mediastinum/Nodes: There are no enlarged mediastinal, hilar or axillary lymph nodes.Hilar assessment is limited by the lack of intravenous contrast, although the hilar contours appear unchanged. The thyroid gland, trachea and esophagus demonstrate no significant findings.  Lungs/Pleura: No pleural effusion or pneumothorax. Moderate to severe centrilobular  and paraseptal emphysema again noted. The large thick-walled, cavitary left upper lobe lesion measures approximately 7.1 x 4.5 cm on image 38/8 (previously 6.9 x 5.4 cm). There is increased multifocal nodularity at the left apex superior to this cavitary lesion which appears inflammatory. There is also an enlarging nodular component along the inferior aspect of the cavitary lesion which measures 9 mm on image 47/8. Other scattered nodules in the lingula are stable. There are stable small nodules in the left lower lobe,  measuring up to 9 mm on image 129/8. Irregular right lung nodules are stable, including components measuring 16 mm (image 67/8) and 13 mm (image 83/8), both in the upper lobe. No enlarging nodule seen in the right lung.  Upper abdomen: The visualized upper abdomen appears stable without acute findings.  Musculoskeletal/Chest wall: There is no chest wall mass or suspicious osseous finding.  IMPRESSION: 1. The large thick-walled, cavitary left upper lobe lesion has not significantly changed in size from the most recent CT and is most consistent with postinflammatory scarring given relative stability for several years. 2. There is increased multifocal nodularity at the left apex superior to this cavitary lesion, which appears inflammatory. Continued follow-up in 6-12 months suggested. 3. Additional bilateral pulmonary nodules are stable. No adenopathy or pleural effusion. 4. Aortic Atherosclerosis (ICD10-I70.0) and Emphysema (ICD10-J43.9).   Electronically Signed   By: Richardean Sale M.D.   On: 06/16/2019 12:06 I personally reviewed the CT images and concur with the findings noted above  Impression: Calvin Schroeder  is a 59 year old man with a history of tobacco abuse, COPD, pulmonary MAI, HIV, Kaposi's sarcoma, and chronic back pain.  Presented with respiratory issues back in 2018.  Was found to have a complex consolidative and cavitary process in the left upper lobe.  Biopsy showed granulomas and grew MAC.  He has been followed since that time.  His lungs sound better today than they have on exam previously.  I do not hear any wheezing.  If he continues to have questions regarding COPD should follow-up with Dr. Valeta Harms.  His left upper lobe cavitary mass is essentially unchanged.  I did not really see any change in wall thickness.  There are some additional nodules in the left upper lobe.  We will plan to do a follow-up CT in 6 months to get another look at those.  Plan: Return  in 6 months with CT chest  Spent a total of 20 minutes in review of records, images, and did consultation with Calvin Schroeder today  [name], MD [facility] [phone]

## 2019-06-23 DIAGNOSIS — J449 Chronic obstructive pulmonary disease, unspecified: Secondary | ICD-10-CM | POA: Diagnosis not present

## 2019-06-23 DIAGNOSIS — B20 Human immunodeficiency virus [HIV] disease: Secondary | ICD-10-CM | POA: Diagnosis not present

## 2019-06-23 DIAGNOSIS — A310 Pulmonary mycobacterial infection: Secondary | ICD-10-CM | POA: Diagnosis not present

## 2019-06-23 DIAGNOSIS — C469 Kaposi's sarcoma, unspecified: Secondary | ICD-10-CM | POA: Diagnosis not present

## 2019-06-23 DIAGNOSIS — Z0000 Encounter for general adult medical examination without abnormal findings: Secondary | ICD-10-CM | POA: Diagnosis not present

## 2019-06-24 ENCOUNTER — Telehealth: Payer: Self-pay | Admitting: Pulmonary Disease

## 2019-06-24 DIAGNOSIS — A310 Pulmonary mycobacterial infection: Secondary | ICD-10-CM

## 2019-06-24 MED ORDER — BENZONATATE 200 MG PO CAPS: 200.0000 mg | ORAL_CAPSULE | Freq: Four times a day (QID) | ORAL | 1 refills | Status: DC | PRN

## 2019-06-24 MED ORDER — LEVOFLOXACIN 750 MG PO TABS: 750.0000 mg | ORAL_TABLET | Freq: Every day | ORAL | 0 refills | Status: AC

## 2019-06-24 NOTE — Telephone Encounter (Signed)
Dr. Carlis Abbott does not have any available appointments for the next 3 months.   I asked him if he was using a flutter valve. He does not have one. I advised him I would check to see if we had any.   For the Tessalon, do you want [place] to call in the 100mg  or 200mg ?

## 2019-06-24 NOTE — Telephone Encounter (Signed)
Start patient on Levaquin 750mg  daily X 7 days  Come to office for CXR  And we can call him with results Any way we can get him into the office sooner? Any provider can see him  Garner Nash, DO [facility] 06/24/2019 2:01 PM

## 2019-06-24 NOTE — Telephone Encounter (Signed)
Spoke with patient. He is aware that we will send in Levaquin 750mg  for him. He will come by the office tomorrow to get a flutter valve and will ask for someone in triage to show him how to use it.   Nothing further needed at time of call.

## 2019-06-24 NOTE — Telephone Encounter (Signed)
200mg  tessalon Go ahead and setup appt to est with Carlis Abbott regarding MAI management. I know he follows with ID but she has a special interest in management of this disease and I would like her opinion.  Thanks Garner Nash, DO [facility] 06/24/2019 12:23 PM

## 2019-06-24 NOTE — Telephone Encounter (Signed)
Rx for tessalon 200mg  has been sent to preferred pharmacy for pt. Called and spoke with pt letting him know this had been done and pt verbalized understanding. Clarified with pt with what he stated to [place] about the flutter valve and he stated that he does not have one. Stated that [place] had stated that she would check to see if we had any and once we had more info about that, we would call him back.  While speaking with pt, pt stated to me that he wonders if he may have pna again as he does not feel well. Pt states that he feels like he might have a worsening infection. Pt stated to me that he has been running a low grade temp. Stated last time he took temp it was 99.4. pt is unsure if his temp has been higher than that as he has mainly been lying around the house due to how he feels. Pt states that he is very weak especially in his legs and states that he also has body aches. Pt denies any complaints of chills.  Pt does also have a slight headache which he thinks is due to the coughing. Pt has not taken any tylenol.  Checked to see when soonest appt was for in-office evaluation and it is not until 6/28 at 4:30 with Beth.  Dr. Valeta Harms, please advise on this for pt as he is wanting to know what needs to be done to help out with his symptoms. Please advise if you believe that pt needs to go to the ED to be evaluated.

## 2019-06-24 NOTE — Telephone Encounter (Signed)
PCCM:  Likely related to airway clearance issues and MAI with cavitary disease.  Please set up with appt to see Dr. Carlis Abbott as soon as possible to review additional options.  Prescribe tessalon q6h prn in the meantime.  Make sure he is using his flutter May benefit from vest therapy at some point.  Garner Nash, DO [facility] 06/24/2019 11:01 AM

## 2019-06-24 NOTE — Telephone Encounter (Signed)
Spoke with patient. He stated that he normally has a cough due to his COPD but the cough he developed on 06/21/19 is different. He has been coughing non-stop and is unable to sleep at night due to the cough. Cough is productive with thick clear phlegm with streaks of yellow at times. He denied any fever or body aches. He is UTD on his covid vaccines and denies being around sick recently.   He had a CT scan done last week that did not show any signs of infection at that time.   He takes a daily dose of azithromycin and ethambutol for MAI.   He wants to know if anything else can be called in to help with the cough, especially at night.   Pharmacy is [facility] on [address].   Dr. Valeta Harms, please advise. Thanks!

## 2019-06-25 ENCOUNTER — Other Ambulatory Visit: Payer: Self-pay

## 2019-06-25 ENCOUNTER — Ambulatory Visit (INDEPENDENT_AMBULATORY_CARE_PROVIDER_SITE_OTHER)
Admission: RE | Admit: 2019-06-25 | Discharge: 2019-06-25 | Disposition: A | Discharge status: Home / Self Care | Payer: Medicaid Other | Source: Ambulatory Visit | Attending: Pulmonary Disease | Admitting: Pulmonary Disease

## 2019-06-25 ENCOUNTER — Telehealth: Payer: Self-pay | Admitting: Pulmonary Disease

## 2019-06-25 DIAGNOSIS — A310 Pulmonary mycobacterial infection: Secondary | ICD-10-CM

## 2019-06-25 DIAGNOSIS — J90 Pleural effusion, not elsewhere classified: Secondary | ICD-10-CM | POA: Diagnosis not present

## 2019-06-25 DIAGNOSIS — J439 Emphysema, unspecified: Secondary | ICD-10-CM | POA: Diagnosis not present

## 2019-06-25 MED ORDER — MISC. DEVICES KIT: PACK | 0 refills | Status: AC

## 2019-06-25 NOTE — Telephone Encounter (Signed)
Patient came by the office this morning for his flutter valve. I was able to show him how to use his device. He verbalized understand. Since we did not have xray here today, I advised him he would need to stop by the [place] office. He verbalized understanding.

## 2019-06-25 NOTE — Progress Notes (Signed)
Patient called with results of CXR, appointment made to see Dr. Carlis Abbott.

## 2019-06-25 NOTE — Telephone Encounter (Signed)
Patient was given a flutter valve today in office. Nothing further needed.

## 2019-06-25 NOTE — Addendum Note (Signed)
Addended by: Valerie Salts on: 06/25/2019 09:35 AM   Modules accepted: Orders

## 2019-06-27 DIAGNOSIS — H5213 Myopia, bilateral: Secondary | ICD-10-CM | POA: Diagnosis not present

## 2019-06-29 DIAGNOSIS — R748 Abnormal levels of other serum enzymes: Secondary | ICD-10-CM | POA: Diagnosis not present

## 2019-06-29 DIAGNOSIS — D7589 Other specified diseases of blood and blood-forming organs: Secondary | ICD-10-CM | POA: Diagnosis not present

## 2019-06-29 DIAGNOSIS — Z0000 Encounter for general adult medical examination without abnormal findings: Secondary | ICD-10-CM | POA: Diagnosis not present

## 2019-06-29 DIAGNOSIS — Z125 Encounter for screening for malignant neoplasm of prostate: Secondary | ICD-10-CM | POA: Diagnosis not present

## 2019-07-02 ENCOUNTER — Emergency Department (HOSPITAL_COMMUNITY)
Admission: EM | Admit: 2019-07-02 | Discharge: 2019-07-02 | Disposition: A | Discharge status: Home / Self Care | Payer: Medicaid Other | Attending: Emergency Medicine | Admitting: Emergency Medicine

## 2019-07-02 ENCOUNTER — Emergency Department (HOSPITAL_COMMUNITY): Payer: Medicaid Other

## 2019-07-02 ENCOUNTER — Other Ambulatory Visit: Payer: Self-pay

## 2019-07-02 ENCOUNTER — Encounter (HOSPITAL_COMMUNITY): Payer: Self-pay | Admitting: Emergency Medicine

## 2019-07-02 DIAGNOSIS — Z20822 Contact with and (suspected) exposure to covid-19: Secondary | ICD-10-CM | POA: Diagnosis not present

## 2019-07-02 DIAGNOSIS — E860 Dehydration: Secondary | ICD-10-CM

## 2019-07-02 DIAGNOSIS — Z792 Long term (current) use of antibiotics: Secondary | ICD-10-CM | POA: Diagnosis not present

## 2019-07-02 DIAGNOSIS — B20 Human immunodeficiency virus [HIV] disease: Secondary | ICD-10-CM | POA: Insufficient documentation

## 2019-07-02 DIAGNOSIS — Z7951 Long term (current) use of inhaled steroids: Secondary | ICD-10-CM | POA: Insufficient documentation

## 2019-07-02 DIAGNOSIS — R110 Nausea: Secondary | ICD-10-CM | POA: Diagnosis not present

## 2019-07-02 DIAGNOSIS — R531 Weakness: Secondary | ICD-10-CM | POA: Diagnosis not present

## 2019-07-02 DIAGNOSIS — J449 Chronic obstructive pulmonary disease, unspecified: Secondary | ICD-10-CM | POA: Insufficient documentation

## 2019-07-02 DIAGNOSIS — E871 Hypo-osmolality and hyponatremia: Secondary | ICD-10-CM | POA: Diagnosis not present

## 2019-07-02 DIAGNOSIS — I10 Essential (primary) hypertension: Secondary | ICD-10-CM | POA: Insufficient documentation

## 2019-07-02 DIAGNOSIS — F17210 Nicotine dependence, cigarettes, uncomplicated: Secondary | ICD-10-CM | POA: Diagnosis not present

## 2019-07-02 DIAGNOSIS — R1110 Vomiting, unspecified: Secondary | ICD-10-CM | POA: Diagnosis not present

## 2019-07-02 DIAGNOSIS — R112 Nausea with vomiting, unspecified: Secondary | ICD-10-CM

## 2019-07-02 DIAGNOSIS — R5383 Other fatigue: Secondary | ICD-10-CM | POA: Diagnosis not present

## 2019-07-02 DIAGNOSIS — J439 Emphysema, unspecified: Secondary | ICD-10-CM | POA: Diagnosis not present

## 2019-07-02 DIAGNOSIS — J90 Pleural effusion, not elsewhere classified: Secondary | ICD-10-CM | POA: Diagnosis not present

## 2019-07-02 LAB — CBC WITH DIFFERENTIAL/PLATELET
Abs Immature Granulocytes: 0.02 10*3/uL (ref 0.00–0.07)
Basophils Absolute: 0 10*3/uL (ref 0.0–0.1)
Basophils Relative: 1 %
Eosinophils Absolute: 0 10*3/uL (ref 0.0–0.5)
Eosinophils Relative: 0 %
HCT: 47.1 % (ref 39.0–52.0)
Hemoglobin: 17.2 g/dL — ABNORMAL HIGH (ref 13.0–17.0)
Immature Granulocytes: 1 %
Lymphocytes Relative: 22 %
Lymphs Abs: 0.7 10*3/uL (ref 0.7–4.0)
MCH: 36.4 pg — ABNORMAL HIGH (ref 26.0–34.0)
MCHC: 36.5 g/dL — ABNORMAL HIGH (ref 30.0–36.0)
MCV: 99.6 fL (ref 80.0–100.0)
Monocytes Absolute: 0.7 10*3/uL (ref 0.1–1.0)
Monocytes Relative: 23 %
Neutro Abs: 1.6 10*3/uL — ABNORMAL LOW (ref 1.7–7.7)
Neutrophils Relative %: 53 %
Platelets: 176 10*3/uL (ref 150–400)
RBC: 4.73 MIL/uL (ref 4.22–5.81)
RDW: 11.8 % (ref 11.5–15.5)
WBC: 3 10*3/uL — ABNORMAL LOW (ref 4.0–10.5)
nRBC: 0 % (ref 0.0–0.2)

## 2019-07-02 LAB — COMPREHENSIVE METABOLIC PANEL
ALT: 40 U/L (ref 0–44)
AST: 49 U/L — ABNORMAL HIGH (ref 15–41)
Albumin: 3.7 g/dL (ref 3.5–5.0)
Alkaline Phosphatase: 63 U/L (ref 38–126)
Anion gap: 8 (ref 5–15)
BUN: 10 mg/dL (ref 6–20)
CO2: 30 mmol/L (ref 22–32)
Calcium: 8.5 mg/dL — ABNORMAL LOW (ref 8.9–10.3)
Chloride: 91 mmol/L — ABNORMAL LOW (ref 98–111)
Creatinine, Ser: 0.83 mg/dL (ref 0.61–1.24)
GFR calc Af Amer: >60 mL/min (ref >60)
GFR calc non Af Amer: >60 mL/min (ref >60)
Glucose, Bld: 110 mg/dL — ABNORMAL HIGH (ref 70–99)
Potassium: 3.9 mmol/L (ref 3.5–5.1)
Sodium: 129 mmol/L — ABNORMAL LOW (ref 135–145)
Total Bilirubin: 0.7 mg/dL (ref 0.3–1.2)
Total Protein: 7.6 g/dL (ref 6.5–8.1)

## 2019-07-02 LAB — URINALYSIS, ROUTINE W REFLEX MICROSCOPIC
Bilirubin Urine: NEGATIVE
Glucose, UA: NEGATIVE mg/dL
Hgb urine dipstick: NEGATIVE
Ketones, ur: 5 mg/dL — AB
Leukocytes,Ua: NEGATIVE
Nitrite: NEGATIVE
Protein, ur: NEGATIVE mg/dL
Specific Gravity, Urine: 1.012 (ref 1.005–1.030)
pH: 7 (ref 5.0–8.0)

## 2019-07-02 LAB — LIPASE, BLOOD: Lipase: 23 U/L (ref 11–51)

## 2019-07-02 LAB — SARS CORONAVIRUS 2 BY RT PCR (HOSPITAL ORDER, PERFORMED IN ~~LOC~~ HOSPITAL LAB): SARS Coronavirus 2: NEGATIVE

## 2019-07-02 MED ORDER — SODIUM CHLORIDE (PF) 0.9 % IJ SOLN
INTRAMUSCULAR | Status: AC
  Filled 2019-07-02: qty 50

## 2019-07-02 MED ORDER — IOHEXOL 300 MG/ML  SOLN
100.0000 mL | Freq: Once | INTRAMUSCULAR | Status: AC | PRN
  Administered 2019-07-02: 100 mL via INTRAVENOUS

## 2019-07-02 MED ORDER — SODIUM CHLORIDE 0.9 % IV BOLUS
1000.0000 mL | Freq: Once | INTRAVENOUS | Status: AC
  Administered 2019-07-02: 1000 mL via INTRAVENOUS

## 2019-07-02 MED ORDER — ONDANSETRON 4 MG PO TBDP
4.0000 mg | ORAL_TABLET | Freq: Three times a day (TID) | ORAL | 0 refills | Status: DC | PRN
Start: 2019-07-02 — End: 2022-06-18

## 2019-07-02 MED ORDER — ONDANSETRON HCL 4 MG/2ML IJ SOLN
4.0000 mg | Freq: Once | INTRAMUSCULAR | Status: AC
  Administered 2019-07-02: 4 mg via INTRAVENOUS
  Filled 2019-07-02: qty 2

## 2019-07-02 NOTE — ED Provider Notes (Addendum)
[facility] DEPT Provider Note   CSN: [id number] Arrival date & time: 07/02/19  0725     History Chief Complaint  Patient presents with  . Fatigue  . Emesis    Calvin Schroeder is a 59 y.o. male.  HPI     59 year old male with a history of HIV/AIDS, Mycobacterium avium intracellulare, Kaposi's sarcoma for which he declined treatment, COPD who presents with generalized weakness and nausea.  Have not been able to eat or sleep, low strength, chronic nausea. Saw physician 6/16 for regular check up, that night began to feel generally weak.  Coughing that night a lot but now cough is the same as chronic. Eating apple sauce and jello for 1.5 weeks, throwing up everything eat.  Low-no appetite and also nausea. No abdominal pain No diarrhea, has only had 1 BM in 10 days (last on Monday ) Back, neck, knee pain Tried to drink pedialyte but threw that up too No headaches Had eye exam 2.5 weeks ago, waiting on glasses No chest pain or dyspnea 99.4 temperature off and on No urinary symptoms  Past Medical History:  Diagnosis Date  . Back pain   . COPD (chronic obstructive pulmonary disease) (HCC)    emphysema   . Dyspnea    with exertion   . HIV (human immunodeficiency virus infection) ([place])   . 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 Pneumonia    11/2016  . Tobacco abuse 06/10/2013    Patient Active Problem List   Diagnosis Date Noted  . Medication management 09/02/2018  . OSA (obstructive sleep apnea) 07/01/2018  . Healthcare maintenance 03/30/2018  . Sebaceous cyst 03/30/2018  . HTN (hypertension) 03/30/2018  . HIV (human immunodeficiency virus infection) ([place]) 09/22/2017  . 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 AIDS (acquired immune deficiency syndrome) ([place]) 04/09/2017  . Kaposi's sarcoma ([place]) 04/09/2017  . 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 MAI (mycobacterium avium-intracellulare) ([place]) 03/17/2017  . Erythrocytosis 06/10/2013  . Lung mass 06/10/2013  .  Tobacco abuse 06/10/2013    Past Surgical History:  Procedure Laterality Date  . APPENDECTOMY    . SHOULDER SURGERY    . VIDEO BRONCHOSCOPY WITH ENDOBRONCHIAL NAVIGATION N/A 02/07/2017   Procedure: VIDEO BRONCHOSCOPY WITH ENDOBRONCHIAL NAVIGATION;  Surgeon: [name], MD;  Location: [facility];  Service: Thoracic;  Laterality: N/A;  . VIDEO BRONCHOSCOPY WITH ENDOBRONCHIAL ULTRASOUND N/A 02/07/2017   Procedure: VIDEO BRONCHOSCOPY WITH ENDOBRONCHIAL ULTRASOUND;  Surgeon: [name], MD;  Location: [facility] OR;  Service: Thoracic;  Laterality: N/A;       Family History  Problem Relation Age of Onset  . Cancer Mother        lung ca  . Cancer Father        skin ca  . Cancer Maternal Uncle        liver ca  . Cancer Maternal Grandmother        bladder ca    Social History   Tobacco Use  . Smoking status: Current Every Day Smoker    Packs/day: 1.00    Years: 40.00    Pack years: 40.00  . Smokeless tobacco: Never Used  . Tobacco comment: 1 pack per day currently, quit date set for 12/23/17 per pt   Vaping Use  . Vaping Use: Never used  Substance Use Topics  . Alcohol use: Yes    Comment: daily - 3-4 alcohol  . Drug use: Yes    Types: Marijuana  Comment: daily     Home Medications Prior to Admission medications   Medication Sig Start Date End Date Taking? Authorizing Provider  albuterol (VENTOLIN HFA) 108 (90 Base) MCG/ACT inhaler Inhale 2 puffs into the lungs every 6 (six) hours as needed for wheezing or shortness of breath. 09/23/18  Yes Icard, Bradley L, DO  azithromycin (ZITHROMAX) 600 MG tablet TAKE 1 TABLET(600 MG) BY MOUTH DAILY Patient taking differently: Take 600 mg by mouth at bedtime.  02/15/19  Yes Campbell Riches, MD  Cholecalciferol 1.25 MG (50000 UT) capsule Take 50,000 Units by mouth every Wednesday. 06/30/19 09/16/19 Yes [provider]  Cholecalciferol 25 MCG (1000 UT) capsule Take 1,000 Units by mouth daily. 06/30/19  Yes [provider]  DESCOVY 200-25 MG tablet TAKE 1 TABLET BY MOUTH DAILY 02/17/19  Yes Campbell Riches, MD  ethambutol (MYAMBUTOL) 400 MG tablet TAKE 2 AND 1/2 TABLETS(1000 MG) BY MOUTH DAILY Patient taking differently: Take 1,000 mg by mouth at bedtime.  09/07/18  Yes Campbell Riches, MD  ipratropium-albuterol (DUONEB) 0.5-2.5 (3) MG/3ML SOLN Take 3 mLs by nebulization every 6 (six) hours as needed. Patient taking differently: Take 3 mLs by nebulization every 6 (six) hours as needed (shortness of breath/wheezing).  03/12/18  Yes Juanito Doom, MD  Tiotropium Bromide-Olodaterol (STIOLTO RESPIMAT) 2.5-2.5 MCG/ACT AERS Inhale 2 puffs into the lungs daily. 11/25/18  Yes Icard, Bradley L, DO  TIVICAY 50 MG tablet TAKE 1 TABLET(50 MG) BY MOUTH DAILY Patient taking differently: Take 50 mg by mouth daily.  02/17/19  Yes Campbell Riches, MD  benzonatate (TESSALON) 200 MG capsule Take 1 capsule (200 mg total) by mouth every 6 (six) hours as needed for cough. Patient not taking: Reported on 07/02/2019 06/24/19   Icard, Octavio Graves, DO  levofloxacin (LEVAQUIN) 750 MG tablet Take 1 tablet (750 mg total) by mouth daily for 10 days. Patient not taking: Reported on 07/02/2019 06/24/19 07/04/19  Garner Nash, DO  Misc. Devices KIT Flutter Valve 06/25/19   June Leap L, DO    Allergies    Rifampin  Review of Systems   Review of Systems  Constitutional: Positive for activity change, appetite change and fatigue. Negative for fever (99.4).  HENT: Negative for sore throat (was until the last few days).   Eyes: Negative for visual disturbance.  Respiratory: Positive for cough (chronic, one night 10 days ago worse). Negative for shortness of breath.   Cardiovascular: Negative for chest pain.  Gastrointestinal: Positive for constipation, nausea and vomiting. Negative for abdominal pain.  Genitourinary: Negative for difficulty urinating, discharge and dysuria.  Musculoskeletal: Positive for arthralgias,  back pain (chronic) and neck pain.  Skin: Negative for rash.  Neurological: Negative for syncope and headaches.    Physical Exam Updated Vital Signs BP 114/89   Pulse 73   Temp 98.2 F (36.8 C) (Oral)   Resp 19   SpO2 97%   Physical Exam Vitals and nursing note reviewed.  Constitutional:      General: He is not in acute distress.    Appearance: He is well-developed. He is not diaphoretic.  HENT:     Head: Normocephalic and atraumatic.  Eyes:     Conjunctiva/sclera: Conjunctivae normal.  Cardiovascular:     Rate and Rhythm: Normal rate and regular rhythm.     Heart sounds: Normal heart sounds. No murmur heard.  No friction rub. No gallop.   Pulmonary:     Effort: Pulmonary effort is normal. No respiratory  distress.     Breath sounds: Normal breath sounds. No wheezing or rales.  Abdominal:     General: There is no distension.     Palpations: Abdomen is soft.     Tenderness: There is no abdominal tenderness. There is no guarding.  Musculoskeletal:     Cervical back: Normal range of motion.  Skin:    General: Skin is warm and dry.  Neurological:     Mental Status: He is alert and oriented to person, place, and time.     ED Results / Procedures / Treatments   Labs (all labs ordered are listed, but only abnormal results are displayed) Labs Reviewed  CBC WITH DIFFERENTIAL/PLATELET - Abnormal; Notable for the following components:      Result Value   WBC 3.0 (*)    Hemoglobin 17.2 (*)    MCH 36.4 (*)    MCHC 36.5 (*)    Neutro Abs 1.6 (*)    All other components within normal limits  COMPREHENSIVE METABOLIC PANEL - Abnormal; Notable for the following components:   Sodium 129 (*)    Chloride 91 (*)    Glucose, Bld 110 (*)    Calcium 8.5 (*)    AST 49 (*)    All other components within normal limits  URINALYSIS, ROUTINE W REFLEX MICROSCOPIC - Abnormal; Notable for the following components:   Ketones, ur 5 (*)    All other components within normal limits  SARS  CORONAVIRUS 2 BY RT PCR (HOSPITAL ORDER, [facility])  URINE CULTURE  CULTURE, BLOOD (ROUTINE X 2)  CULTURE, BLOOD (ROUTINE X 2)  LIPASE, BLOOD    EKG EKG Interpretation  Date/Time:  Friday July 02 2019 08:36:42 EDT Ventricular Rate:  72 PR Interval:    QRS Duration: 107 QT Interval:  388 QTC Calculation: 425 R Axis:   75 Text Interpretation: Sinus rhythm Probable left atrial enlargement RSR' in V1 or V2, probably normal variant Nonspecific T abnrm, anterolateral leads No significant change since last tracing Confirmed by Gareth Morgan [phone]) on 07/02/2019 9:49:31 AM   Radiology DG Chest 2 View  Result Date: 07/02/2019 CLINICAL DATA:  Generalized weakness, nausea vomiting and fatigue for 9 days EXAM: CHEST - 2 VIEW COMPARISON:  Chest CT of 06/16/2019 FINDINGS: Cavitary area in the LEFT upper lobe with added density similar to previous exams and compatible with known thick-walled cavity in this location. Multifocal nodularity in the area not as well seen as on recent CT imaging. Nodularity in the RIGHT chest also not as well displayed. Background changes of pulmonary emphysema without evidence of lobar consolidation or sign of pleural effusion. Visualized skeletal structures on limited assessment without acute process. IMPRESSION: Signs of pulmonary emphysema without acute cardiopulmonary disease. Cavitary thick-walled area in the LEFT upper chest and nodules better seen on recent chest CT. No signs of lobar consolidation or effusion. Electronically Signed   By: Zetta Bills M.D.   On: 07/02/2019 09:23   CT ABDOMEN PELVIS W CONTRAST  Result Date: 07/02/2019 CLINICAL DATA:  Bowel obstruction, vomiting EXAM: CT ABDOMEN AND PELVIS WITH CONTRAST TECHNIQUE: Multidetector CT imaging of the abdomen and pelvis was performed using the standard protocol following bolus administration of intravenous contrast. CONTRAST:  160m OMNIPAQUE IOHEXOL 300 MG/ML  SOLN COMPARISON:   CT chest of June 16, 2019, CT PET exam from January 24, 2017 FINDINGS: Lower chest: No consolidation.  No pleural effusion. Hepatobiliary: No focal, suspicious hepatic lesion. No pericholecystic stranding. No biliary  duct dilation. Pancreas: Pancreas is normal without focal lesion or inflammation. No ductal dilation. Spleen: Spleen normal size and contour. Adrenals/Urinary Tract: Adrenal glands are normal. Kidneys enhance symmetrically.  No sign of hydronephrosis. Stomach/Bowel: No acute small bowel process. Stomach is under distended limiting assessment without perigastric stranding. Post appendectomy. No pericolonic stranding. Vascular/Lymphatic: Calcific and noncalcific atheromatous plaque of the abdominal aorta. No adenopathy in the retroperitoneum with scattered small lymph nodes that do not show changed since 2019. No upper abdominal lymphadenopathy. No pelvic adenopathy. Reproductive: Prostate grossly normal by CT. Other: No acute abdominal wall process or hernia. No ascites. No free air. Musculoskeletal: Spinal degenerative changes. No acute or destructive bone finding. IMPRESSION: 1. No acute findings in the abdomen or pelvis. 2. Aortic atherosclerosis. Aortic Atherosclerosis (ICD10-I70.0). Electronically Signed   By: Zetta Bills M.D.   On: 07/02/2019 11:05    Procedures Procedures (including critical care time)  Medications Ordered in ED Medications  sodium chloride (PF) 0.9 % injection (has no administration in time range)  sodium chloride 0.9 % bolus 1,000 mL (1,000 mLs Intravenous New Bag/Given 07/02/19 0833)  ondansetron (ZOFRAN) injection 4 mg (4 mg Intravenous Given 07/02/19 0833)  iohexol (OMNIPAQUE) 300 MG/ML solution 100 mL (100 mLs Intravenous Contrast Given 07/02/19 1033)    ED Course  I have reviewed the triage vital signs and the nursing notes.  Pertinent labs & imaging results that were available during my care of the patient were reviewed by me and considered in my medical  decision making (see chart for details).    MDM Rules/Calculators/A&P                          59 year old male with a history of HIV/AIDS, Mycobacterium avium intracellulare, Kaposi's sarcoma for which he declined treatment, COPD who presents with generalized weakness and nausea.  No sign of urinary tract infection.  Chest x-ray shows thick-walled area in the left upper chest and nodules which had been previously diagnosed as MAI.  CT abdomen pelvis without acute abnormalities.  Labs significant for hyponatremia with a sodium of 129, down from 140 in December.  Discussed with Dr. Drucilla Schmidt of infectious disease-we will send blood cultures. Unclear etiology of fatigue, nausea and vomiting at this time.  Discussed admission for hydration in the setting of decreased p.o. intake, nausea, vomiting, and hyponatremia.  He reports feeling better after nausea medicine, is tolerating po and would prefer to go home.   Given he is tolerating po, feel outpatient follow up of sodium early next week and visit with PCP and ID physician are appropriate. Patient discharged in stable condition with understanding of reasons to return.    Final Clinical Impression(s) / ED Diagnoses Final diagnoses:  Dehydration  Hyponatremia  Nausea and vomiting, intractability of vomiting not specified, unspecified vomiting type    Rx / DC Orders ED Discharge Orders    None         Gareth Morgan, MD 07/02/19 1133

## 2019-07-02 NOTE — ED Triage Notes (Signed)
Patient reports N/V, generalized fatigue and weakness x9 days. Denies any chest pain or SOB. Ambulatory upon triage. Patient appears pale. Was told at an annual physical that his Vitamin D was low, but he feels as if more is going on. Had both COVID vaccines.

## 2019-07-02 NOTE — Discharge Instructions (Addendum)
Thank you for seeing [place] today! Your COVID test was negative. Your blood cultures are still pending. Please follow up closely with your doctor early next week for a recheck of your labs and return for any new or worsening symptoms.

## 2019-07-03 DIAGNOSIS — G4733 Obstructive sleep apnea (adult) (pediatric): Secondary | ICD-10-CM | POA: Diagnosis not present

## 2019-07-03 DIAGNOSIS — J449 Chronic obstructive pulmonary disease, unspecified: Secondary | ICD-10-CM | POA: Diagnosis not present

## 2019-07-03 LAB — URINE CULTURE: Culture: NO GROWTH

## 2019-07-06 ENCOUNTER — Encounter: Payer: Self-pay | Admitting: Critical Care Medicine

## 2019-07-06 ENCOUNTER — Ambulatory Visit (INDEPENDENT_AMBULATORY_CARE_PROVIDER_SITE_OTHER): Payer: Medicaid Other | Admitting: Critical Care Medicine

## 2019-07-06 ENCOUNTER — Other Ambulatory Visit: Payer: Self-pay

## 2019-07-06 VITALS — BP 120/70 | HR 62 | Temp 98.4°F | Ht 70.0 in | Wt 141.8 lb

## 2019-07-06 DIAGNOSIS — J984 Other disorders of lung: Secondary | ICD-10-CM

## 2019-07-06 DIAGNOSIS — E860 Dehydration: Secondary | ICD-10-CM | POA: Diagnosis not present

## 2019-07-06 DIAGNOSIS — Z720 Tobacco use: Secondary | ICD-10-CM | POA: Diagnosis not present

## 2019-07-06 DIAGNOSIS — J432 Centrilobular emphysema: Secondary | ICD-10-CM | POA: Diagnosis not present

## 2019-07-06 DIAGNOSIS — E871 Hypo-osmolality and hyponatremia: Secondary | ICD-10-CM | POA: Diagnosis not present

## 2019-07-06 DIAGNOSIS — Z09 Encounter for follow-up examination after completed treatment for conditions other than malignant neoplasm: Secondary | ICD-10-CM | POA: Diagnosis not present

## 2019-07-06 DIAGNOSIS — A310 Pulmonary mycobacterial infection: Secondary | ICD-10-CM | POA: Diagnosis not present

## 2019-07-06 NOTE — Patient Instructions (Addendum)
Thank you for visiting Dr. Carlis Abbott at [facility]. We recommend the following: Orders Placed This Encounter  Procedures  . Acid Fast Smear+Culture W/Rflx   Orders Placed This Encounter  Procedures  . Acid Fast Smear+Culture W/Rflx   Keep using Stiolto once daily.    Return in about 2 months (around 09/05/2019). with [name].    Please do your part to reduce the spread of COVID-19.  It is very important that you stop smoking or vaping. This is the single most important thing that you can do to improve your lung health.   S = Set a quit date. T = Tell family, friends, and the people around you that you plan to quit. A = Anticipate or plan ahead for the tough times you'll face while quitting. R = Remove cigarettes and other tobacco products from your home, car, and work. T = Talk to [place] about getting help to quit.  If you need help, please reach out to our office or the smoking cessation resources available: [place] Smoking Cessation Class: [phone] 1-800-QUIT-NOW www.BeTobaccoFree.gov

## 2019-07-06 NOTE — Progress Notes (Signed)
Synopsis: Referred in 2019 for cavitary lung lesion, COPD by Calvin Lush, PA-C. Previously a patient of Drs. McQuaid and Icard.  Subjective:   PATIENT ID: Calvin Schroeder: male DOB: 25-May-1960, MRN: [id number]  Chief Complaint  Patient presents with  . Consult    Patient went to ED on 6/25 and was told he was dehydrated, vit D and sodium defencey. Cough with clear/yellow sputum. Other wise feels good    Calvin Schroeder is a 59 y/o gentleman with a history of HIV, cavitary pulmonary MAC, and COPD with ongoing tobacco abuse who presents for follow up. He was diagnosed with MAC on lung biopsy of a granulomatous lesion with subsequent culture that grew MAC. He was started on RAE therapy 3 days per week in 2019, but was intolerant to rifampin. He has remained for the past 1.5 years on ethambutol and azithromycin. No follow up respiratory cultures have been completed. He has had follow up CT scans that show no significant change in the cavity. He continues to smoke ~1 ppd x 42 years. Due to feeling ill from viral gastroenteritis recently, he cut back to 0.5ppd. He continues to take Stiotlo daily for COPD. He has some DOE and coughs up yellow phlegm, which increases when he has an AE. He thinks Trelegy was more beneficial for his breathing. He rarely has wheezing. No hemoptysis, weight loss or change in appetite, DOE. He can walk >1 mile, but would walk slower than he used to. He has not required antibiotics or prednisone for his breathing recently.    Previous providers: 11/25/2018 OV: 59 year old gentleman past medical history of COPD (PFTs 2019 ratio 65 FEV1 61%), emphysema, HIV, MAI.  History of Kaposi's sarcoma.  He initially presented in 2018 was seen by cardiothoracic surgery taken for navigational bronchoscopy biopsies revealed granulomas and cultures grew MAI.  He was started on triple antibiotic regimen at that time he did not tolerate and was reduced to 2 drug regimen azithromycin  and ethambutol.  Last seen by Dr. [name] in February 2020 and beginning of September 2020.  Repeat CT imaging with a large complex consolidation with cavitation within the left upper lobe.  However with some improvement.  Unfortunately he continues to smoke approximately 1 pack/day.  Dr. [name] plans on seeing him back in 3 months with repeat CT imaging.  No plans for surgical intervention at this time.  OV 09/23/2018: Patient here today to establish care with new primary pulmonologist.  Patient has been followed by Dr. Lake Bells for several years.  Last seen by Dr. Lake Bells in March 2020.  Patient has severe COPD was started on Trelegy at that time.  Since then has had multiple follow-ups with [name], NP in clinic.  Last seen in August 2020.  Inhaler regimen has subsequently been changed to Symbicort 160, Spiriva 2.5.  Unfortunately still smoking even at this time.  At this point he is doing well.  He feels short of breath at baseline.  This is unchanged from prior.  He has stopped using Symbicort.  He thinks that he does fine with Spiriva only.  He has been using his albuterol inhaler regularly.  He has been taking his antiretroviral medications and MAC medications regularly.   OV 11/25/2018: Patient seen today for follow-up regarding shortness of breath.  Patient admits last week had an episode that he felt more short of breath.  Unfortunately he is still smoking.  We talked about smoking today in the office.  He has  been using his medications.  Last seen by [name] in the office.  Patient was started on Spiriva Respimat which he likes.  He was seen by Dr. [name] with plans for image follow-up of the cavitary lesion.  Additionally has follow-up with Dr. [name] in infectious disease scheduled next month.  Patient denies hemoptysis or significant neck sputum production.    Past Medical History:  Diagnosis Date  . Back pain   . COPD (chronic obstructive pulmonary disease) (HCC)     emphysema   . Dyspnea    with exertion   . HIV (human immunodeficiency virus infection) ([place])   . 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 Pneumonia    11/2016  . Tobacco abuse 06/10/2013     Family History  Problem Relation Age of Onset  . Cancer Mother        lung ca  . Cancer Father        skin ca  . Cancer Maternal Uncle        liver ca  . Cancer Maternal Grandmother        bladder ca     Past Surgical History:  Procedure Laterality Date  . APPENDECTOMY    . SHOULDER SURGERY    . VIDEO BRONCHOSCOPY WITH ENDOBRONCHIAL NAVIGATION N/A 02/07/2017   Procedure: VIDEO BRONCHOSCOPY WITH ENDOBRONCHIAL NAVIGATION;  Surgeon: [name], MD;  Location: [facility];  Service: Thoracic;  Laterality: N/A;  . VIDEO BRONCHOSCOPY WITH ENDOBRONCHIAL ULTRASOUND N/A 02/07/2017   Procedure: VIDEO BRONCHOSCOPY WITH ENDOBRONCHIAL ULTRASOUND;  Surgeon: [name], MD;  Location: [facility];  Service: Thoracic;  Laterality: N/A;    Social History   Socioeconomic History  . Marital status: Single    Spouse name: Not on file  . Number of children: Not on file  . Years of education: Not on file  . Highest education level: Not on file  Occupational History  . Not on file  Tobacco Use  . Smoking status: Current Every Day Smoker    Packs/day: 1.00    Years: 40.00    Pack years: 40.00  . Smokeless tobacco: Never Used  . Tobacco comment: 1 pack per day currently, quit date set for 12/23/17 per pt   Vaping Use  . Vaping Use: Never used  Substance and Sexual Activity  . Alcohol use: Yes    Comment: daily - 3-4 alcohol  . Drug use: Yes    Types: Marijuana    Comment: daily   . Sexual activity: Not on file  Other Topics Concern  . Not on file  Social History Narrative  . Not on file   Social Determinants of Health   Financial Resource Strain:   . Difficulty of Paying Living Expenses:   Food Insecurity:   . Worried About Charity fundraiser in the Last Year:   . Arboriculturist in the  Last Year:   Transportation Needs:   . Film/video editor (Medical):   Marland Kitchen Lack of Transportation (Non-Medical):   Physical Activity:   . Days of Exercise per Week:   . Minutes of Exercise per Session:   Stress:   . Feeling of Stress :   Social Connections:   . Frequency of Communication with Friends and Family:   . Frequency of Social Gatherings with Friends and Family:   . Attends Religious Services:   . Active Member of Clubs or Organizations:   . Attends Archivist Meetings:   .  Marital Status:   Intimate Partner Violence:   . Fear of Current or Ex-Partner:   . Emotionally Abused:   Marland Kitchen Physically Abused:   . Sexually Abused:      Allergies  Allergen Reactions  . Rifampin     Reports he could not function for 4 days  Other reaction(s): Other Reports he could not function for 4 days      Immunization History  Administered Date(s) Administered  . Hepatitis A, Adult 04/09/2017, 07/28/2017  . Influenza,inj,Quad PF,6+ Mos 04/09/2017, 10/21/2017, 09/02/2018  . Moderna SARS-COVID-2 Vaccination 01/20/2019, 02/17/2019  . Pneumococcal Conjugate-13 06/09/2017  . Pneumococcal Polysaccharide-23 04/09/2017  . Pneumococcal-Unspecified 04/09/2017  . Tdap 07/03/2018    Outpatient Medications Prior to Visit  Medication Sig Dispense Refill  . albuterol (VENTOLIN HFA) 108 (90 Base) MCG/ACT inhaler Inhale 2 puffs into the lungs every 6 (six) hours as needed for wheezing or shortness of breath. 8 g 5  . azithromycin (ZITHROMAX) 600 MG tablet TAKE 1 TABLET(600 MG) BY MOUTH DAILY (Patient taking differently: Take 600 mg by mouth at bedtime. ) 30 tablet 3  . benzonatate (TESSALON) 200 MG capsule Take 1 capsule (200 mg total) by mouth every 6 (six) hours as needed for cough. 30 capsule 1  . Cholecalciferol 1.25 MG (50000 UT) capsule Take 50,000 Units by mouth every Wednesday.    . Cholecalciferol 25 MCG (1000 UT) capsule Take 1,000 Units by mouth daily.    . DESCOVY 200-25 MG  tablet TAKE 1 TABLET BY MOUTH DAILY 90 tablet 1  . ethambutol (MYAMBUTOL) 400 MG tablet TAKE 2 AND 1/2 TABLETS(1000 MG) BY MOUTH DAILY (Patient taking differently: Take 1,000 mg by mouth at bedtime. ) 225 tablet 3  . ipratropium-albuterol (DUONEB) 0.5-2.5 (3) MG/3ML SOLN Take 3 mLs by nebulization every 6 (six) hours as needed. (Patient taking differently: Take 3 mLs by nebulization every 6 (six) hours as needed (shortness of breath/wheezing). ) 360 mL 5  . Misc. Devices KIT Flutter Valve 1 kit 0  . ondansetron (ZOFRAN ODT) 4 MG disintegrating tablet Take 1 tablet (4 mg total) by mouth every 8 (eight) hours as needed for nausea or vomiting. 20 tablet 0  . Tiotropium Bromide-Olodaterol (STIOLTO RESPIMAT) 2.5-2.5 MCG/ACT AERS Inhale 2 puffs into the lungs daily. 4 g 6  . TIVICAY 50 MG tablet TAKE 1 TABLET(50 MG) BY MOUTH DAILY (Patient taking differently: Take 50 mg by mouth daily. ) 30 tablet 5   No facility-administered medications prior to visit.    Review of Systems  Constitutional: Negative for chills, fever and weight loss.  HENT: Negative.   Respiratory: Positive for cough, sputum production and shortness of breath. Negative for hemoptysis and wheezing.   Cardiovascular: Negative for chest pain and leg swelling.  Gastrointestinal:       Recent gastroenteritis- resolved  Genitourinary: Negative.   Skin: Negative for rash.  Neurological: Negative.   Endo/Heme/Allergies: Negative for environmental allergies.     Objective:   Vitals:   07/06/19 1505  BP: 120/70  Pulse: 62  Temp: 98.4 F (36.9 C)  TempSrc: Oral  SpO2: 98%  Weight: 141 lb 12.8 oz (64.3 kg)  Height: _0  (1.778 m)   98% on  RA BMI Readings from Last 3 Encounters:  07/06/19 20.35 kg/m  07/02/19 20.56 kg/m  06/22/19 20.60 kg/m   Wt Readings from Last 3 Encounters:  07/06/19 141 lb 12.8 oz (64.3 kg)  07/02/19 143 lb 4.8 oz (65 kg)  06/22/19 143 lb 9.6 oz (  65.1 kg)    Physical Exam Vitals reviewed.    Constitutional:      General: He is not in acute distress. HENT:     Head: Normocephalic and atraumatic.  Eyes:     General: No scleral icterus. Cardiovascular:     Rate and Rhythm: Normal rate and regular rhythm.     Heart sounds: No murmur heard.   Pulmonary:     Comments: No observed coughing. No conversational dyspnea. CTAB. Abdominal:     General: There is no distension.     Palpations: Abdomen is soft.     Tenderness: There is no abdominal tenderness.  Musculoskeletal:     Cervical back: Neck supple.  Lymphadenopathy:     Cervical: No cervical adenopathy.  Skin:    General: Skin is warm.     Findings: No rash.  Neurological:     General: No focal deficit present.     Mental Status: He is alert.     Coordination: Coordination normal.  Psychiatric:        Mood and Affect: Mood normal.        Behavior: Behavior normal.      CBC    Component Value Date/Time   WBC 3.0 (L) 07/02/2019 0811   RBC 4.73 07/02/2019 0811   HGB 17.2 (H) 07/02/2019 0811   HGB 14.3 12/10/2016 1042   HCT 47.1 07/02/2019 0811   HCT 42.1 12/10/2016 1042   PLT 176 07/02/2019 0811   PLT 121 (L) 12/10/2016 1042   MCV 99.6 07/02/2019 0811   MCV 98.9 (H) 12/10/2016 1042   MCH 36.4 (H) 07/02/2019 0811   MCHC 36.5 (H) 07/02/2019 0811   RDW 11.8 07/02/2019 0811   RDW 12.6 12/10/2016 1042   LYMPHSABS 0.7 07/02/2019 0811   LYMPHSABS 0.7 (L) 12/10/2016 1042   MONOABS 0.7 07/02/2019 0811   MONOABS 0.4 12/10/2016 1042   EOSABS 0.0 07/02/2019 0811   EOSABS 0.1 12/10/2016 1042   BASOSABS 0.0 07/02/2019 0811   BASOSABS 0.0 12/10/2016 1042    CHEMISTRY Recent Labs  Lab 07/02/19 0811  NA 129*  K 3.9  CL 91*  CO2 30  GLUCOSE 110*  BUN 10  CREATININE 0.83  CALCIUM 8.5*   Estimated Creatinine Clearance: 90.4 mL/min (by C-G formula based on SCr of 0.83 mg/dL).   Chest Imaging- films reviewed: CT chest 06/16/19- LUL thick-walled cavity with associated bronchiectasis. Scattered tree-in-bud  opacities and airway thickening without bronchiectasis in other lobes. Severe centrilobular emphysema most notable in b/l ULs.  Pulmonary Functions Testing Results: PFT Results Latest Ref Rng & Units 06/11/2017  FVC-Pre L 3.63  FVC-Predicted Pre % 73  Pre FEV1/FVC % % 65  FEV1-Pre L 2.34  FEV1-Predicted Pre % 61   2019- moderate obstruction      Assessment & Plan:     ICD-10-CM   1. Tobacco abuse  Z72.0   2. Centrilobular emphysema (HCC)  J43.2 Acid Fast Smear+Culture W/Rflx  3. Pulmonary Mycobacterium avium complex (MAC) infection (HCC)  A31.0 Acid Fast Smear+Culture W/Rflx  4. 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 If this culture is negative, we should confirm with a second culture before considering discontinuing MAC meds. -Con't Stiolto -Con't daily ethambutol and azithromycin. Needs ophtho follow up.  If he develops auditory symptoms, he will need audiology evaluation for ototoxicity from macrolide. -If his sputum production becomes more pronounced, we will begin hypertonic saline nebs with flutter for airway clearance therapy -Needs excellent HIV control; con't to follow up with Dr. [name]. -needs follow up CT chest Dec 2021.  Tobacco abuse -Strongly recommend cessation; he indicated that he only thinks he would be able to quit by going cold [place] and he isn't ready for that yet. He understands my concern that his risk of lung cancer and COPD progression are high. -I asked him to please let me know if at any time he is ready to quit smoking. Chantix has the best evidence for success when used as a tool to quit.  COPD -con't Stiolto -UTD flu, pneumococcal, covid vaccines. -recommend smoking cessation  RTC in 2  months.   I spent 55 minutes on this encounter, including face to face time and non-face to face time spent reviewing records, charting, coordinating care, etc.     Current Outpatient Medications:  .  albuterol (VENTOLIN HFA) 108 (90 Base) MCG/ACT inhaler, Inhale 2 puffs into the lungs every 6 (six) hours as needed for wheezing or shortness of breath., Disp: 8 g, Rfl: 5 .  azithromycin (ZITHROMAX) 600 MG tablet, TAKE 1 TABLET(600 MG) BY MOUTH DAILY (Patient taking differently: Take 600 mg by mouth at bedtime. ), Disp: 30 tablet, Rfl: 3 .  benzonatate (TESSALON) 200 MG capsule, Take 1 capsule (200 mg total) by mouth every 6 (six) hours as needed for cough., Disp: 30 capsule, Rfl: 1 .  Cholecalciferol 1.25 MG (50000 UT) capsule, Take 50,000 Units by mouth every Wednesday., Disp: , Rfl:  .  Cholecalciferol 25 MCG (1000 UT) capsule, Take 1,000 Units by mouth daily., Disp: , Rfl:  .  DESCOVY 200-25 MG tablet, TAKE 1 TABLET BY MOUTH DAILY, Disp: 90 tablet, Rfl: 1 .  ethambutol (MYAMBUTOL) 400 MG tablet, TAKE 2 AND 1/2 TABLETS(1000 MG) BY MOUTH DAILY (Patient taking differently: Take 1,000 mg by mouth at bedtime. ), Disp: 225 tablet, Rfl: 3 .  ipratropium-albuterol (DUONEB) 0.5-2.5 (3) MG/3ML SOLN, Take 3 mLs by nebulization every 6 (six) hours as needed. (Patient taking differently: Take 3 mLs by nebulization every 6 (six) hours as needed (shortness of breath/wheezing). ), Disp: 360 mL, Rfl: 5 .  Misc. Devices KIT, Flutter Valve, Disp: 1 kit, Rfl: 0 .  ondansetron (ZOFRAN ODT) 4 MG disintegrating tablet, Take 1 tablet (4 mg total) by mouth every 8 (eight) hours as needed for nausea or vomiting., Disp: 20 tablet, Rfl: 0 .  Tiotropium Bromide-Olodaterol (STIOLTO RESPIMAT) 2.5-2.5 MCG/ACT AERS, Inhale 2 puffs into the lungs daily., Disp: 4 g, Rfl: 6 .  TIVICAY 50 MG tablet, TAKE 1 TABLET(50 MG) BY MOUTH DAILY (Patient taking differently: Take 50 mg by mouth daily. ), Disp: 30 tablet, Rfl: 5    Julian Hy, DO [facility] 07/06/2019 7:16 PM

## 2019-07-07 LAB — CULTURE, BLOOD (ROUTINE X 2)
Culture: NO GROWTH
Culture: NO GROWTH
Special Requests: ADEQUATE
Special Requests: ADEQUATE

## 2019-07-09 ENCOUNTER — Other Ambulatory Visit: Payer: Medicaid Other

## 2019-07-09 DIAGNOSIS — J432 Centrilobular emphysema: Secondary | ICD-10-CM

## 2019-07-09 DIAGNOSIS — A310 Pulmonary mycobacterial infection: Secondary | ICD-10-CM

## 2019-07-09 DIAGNOSIS — J984 Other disorders of lung: Secondary | ICD-10-CM

## 2019-07-09 NOTE — Addendum Note (Signed)
Addended by: Lia Foyer R on: 07/09/2019 10:49 AM   Modules accepted: Orders

## 2019-07-27 ENCOUNTER — Ambulatory Visit: Payer: Medicaid Other

## 2019-07-27 ENCOUNTER — Telehealth: Payer: Self-pay | Admitting: Critical Care Medicine

## 2019-07-27 ENCOUNTER — Other Ambulatory Visit: Payer: Self-pay

## 2019-07-27 DIAGNOSIS — A310 Pulmonary mycobacterial infection: Secondary | ICD-10-CM

## 2019-07-27 NOTE — Telephone Encounter (Signed)
Ok thanks. Can you please ask him to come pick up another cup an do a repeat specimen to confirm this new pathogen? Please also let him know I will touch base with his ID doctor because he likely needs a change in therapy. Thanks!  Julian Hy, DO 07/27/19 10:37 AM [facility]

## 2019-07-27 NOTE — Telephone Encounter (Signed)
Called and spoke with pt letting him know the into per Dr. Carlis Abbott and that she wants him to repeat culture to see if the bacteria is still growing with what we were told. Pt verbalized understanding. New specimen cup has been placed up front for pt and new order has also been placed. Nothing further needed.

## 2019-07-27 NOTE — Telephone Encounter (Signed)
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 Ainsley Spinner attention for her to be able to view. Routing to Dr. Carlis Abbott as an Juluis Rainier.

## 2019-07-28 ENCOUNTER — Encounter: Payer: Self-pay | Admitting: Critical Care Medicine

## 2019-07-28 NOTE — Progress Notes (Signed)
[facility] called and sensitivities have been requested for M. abscessus culture.  Julian Hy, DO 07/28/19 11:30 AM [facility]

## 2019-07-30 ENCOUNTER — Other Ambulatory Visit: Payer: Medicaid Other

## 2019-07-30 DIAGNOSIS — A310 Pulmonary mycobacterial infection: Secondary | ICD-10-CM

## 2019-08-02 LAB — AFB ID BY DNA PROBE RFLX AST
M avium complex: NEGATIVE
M tuberculosis complex: NEGATIVE

## 2019-08-02 LAB — RAPID GROWER BROTH SUSCEP.
Amikacin: 4
Ciprofloxacin: >4
Clarithromycin: >16
Doxycycline: 16
Minocycline: >8
Tigecycline: 0.25

## 2019-08-02 LAB — ACID FAST SMEAR+CULTURE W/RFLX
Acid Fast Culture: POSITIVE — AB
Acid Fast Smear: NEGATIVE

## 2019-08-02 LAB — ORG ID BY SEQUENCING RFLX AST

## 2019-08-12 ENCOUNTER — Telehealth: Payer: Self-pay | Admitting: Pharmacist

## 2019-08-12 ENCOUNTER — Other Ambulatory Visit: Payer: Self-pay

## 2019-08-12 ENCOUNTER — Ambulatory Visit (INDEPENDENT_AMBULATORY_CARE_PROVIDER_SITE_OTHER): Payer: Medicaid Other | Admitting: Infectious Diseases

## 2019-08-12 ENCOUNTER — Encounter: Payer: Self-pay | Admitting: Infectious Diseases

## 2019-08-12 DIAGNOSIS — C469 Kaposi's sarcoma, unspecified: Secondary | ICD-10-CM

## 2019-08-12 DIAGNOSIS — A318 Other mycobacterial infections: Secondary | ICD-10-CM | POA: Insufficient documentation

## 2019-08-12 DIAGNOSIS — Z23 Encounter for immunization: Secondary | ICD-10-CM

## 2019-08-12 DIAGNOSIS — B20 Human immunodeficiency virus [HIV] disease: Secondary | ICD-10-CM

## 2019-08-12 DIAGNOSIS — A319 Mycobacterial infection, unspecified: Secondary | ICD-10-CM

## 2019-08-12 MED ORDER — CLOFAZIMINE POWD: 100.0000 mg | Freq: Every day | 2 refills | Status: DC

## 2019-08-12 MED ORDER — LINEZOLID 600 MG PO TABS: 600.0000 mg | ORAL_TABLET | Freq: Two times a day (BID) | ORAL | 3 refills | Status: DC

## 2019-08-12 NOTE — Assessment & Plan Note (Addendum)
Discussed his case with pharm- for now will aoid IV therapies. Will start him on clofazamine, amikacin (inh), linezolid.  Will await giving him these til they are all approved and available.  rtc in 3-4 weeks.

## 2019-08-12 NOTE — Telephone Encounter (Signed)
Completed online application for clofazimine with [employer] . Will update Dr. [name] when approval status has been decided.  Will work on approval for linezolid and omadacycline as well.

## 2019-08-12 NOTE — Progress Notes (Signed)
Subjective:    Patient ID: Calvin Schroeder, male    DOB: 1960-02-08, 59 y.o.   MRN: [id number]  HPI 59yo M previously found to have cystic lesion on chest CT on adm for PNA. 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 He was seen in [place] found to have AIDS.  He was started on E/A/R however developed n/v (that persisted despite zofran) RestartedMAI rx 11-05-17.He states he took this starting in Dec.   He was started on tivicay-descovey. He has had f/u with pulm for COPD. He has also f/u with CVTS for a cyctic mass in LUL. His last CVTS f/u was 06-2019- He had repeat CT at that time that showed: 1. The large thick-walled, cavitary left upper lobe lesion has not significantly changed in size from the most recent CT and is most consistent with postinflammatory scarring given relative stability for several years. 2. There is increased multifocal nodularity at the left apex superior to this cavitary lesion, which appears inflammatory. Continued follow-up in 6-12 months suggested. 3. Additional bilateral pulmonary nodules are stable. No adenopathy or pleural effusion. 4. Aortic Atherosclerosis (ICD10-I70.0) and Emphysema (ICD10-J43.9).  He had f/u appt with Pulm 07-09-19 and had repeat Cx showing M abscessus.  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  Doxycycline 16.0 ug/mL Resistant   Imipenem Comment   Comment: 8.0 ug/mL Intermediate  Linezolid Comment   Comment: 1.0 ug/mL or less, Susceptible  Minocycline >8.0 ug/mL Resistant   Moxifloxacin Comment   Comment: 2.0 ug/mL Intermediate  Tigecycline 0.25 ug/mL   Tobramycin CANCELED   Comment: Test not performed   Result canceled by the ancillary.   Trimethoprim/Sulfa Comment   Comment: >8/152 ug/mL Resistant    He has also been seen by Onc for [place]. He has 3 remaining lesions, one of which is getting smaller.    HIV 1 RNA Quant (copies/mL)  Date Value  12/10/2018 63 (H)  03/16/2018 <20 DETECTED (A)  11/05/2017 <20 NOT DETECTED   CD4 T Cell Abs (/uL)  Date Value  12/10/2018 256 (L)  03/16/2018 230 (L)  11/05/2017 240 (L)    Review of Systems  Constitutional: Negative for appetite change, chills, fever and unexpected weight change.  Respiratory: Positive for cough. Negative for shortness of breath.   Gastrointestinal: Positive for diarrhea. Negative for constipation.  Genitourinary: Negative for difficulty urinating.  Skin: Positive for rash.  Please see HPI. All other systems reviewed and negative.     Objective:   Physical Exam Vitals reviewed.  Constitutional:      Appearance: Normal appearance.  HENT:     Mouth/Throat:     Mouth: Mucous membranes are moist.     Pharynx: No oropharyngeal exudate.  Eyes:     Extraocular Movements: Extraocular movements intact.     Pupils: Pupils are equal, round, and reactive to light.  Cardiovascular:     Rate and Rhythm: Normal rate and regular rhythm.  Pulmonary:     Effort: Pulmonary effort is normal.     Breath sounds: Rhonchi present.  Abdominal:     General: Bowel sounds are normal. There is no distension.     Palpations: Abdomen is soft.     Tenderness: There is no abdominal tenderness.  Musculoskeletal:        General: Normal range of  motion.     Cervical back: Normal range of motion and neck supple.     Right lower leg: No edema.     Left lower leg: No edema.  Neurological:     General: No focal deficit present.     Mental Status: He is alert.  Psychiatric:        Mood and Affect: Mood normal.           Assessment & Plan:

## 2019-08-12 NOTE — Assessment & Plan Note (Addendum)
Spoke to him about changing his ART to [place] but he does not want to change.  Has gotten COVID vax.  rec for him to get shingles vax (at pharm), flu shot at f/u visit.  Will give him mening vax today.  Will defer checking his labs again til we get him start on M abscessus rx.

## 2019-08-12 NOTE — Assessment & Plan Note (Signed)
Appreciate derm f/u

## 2019-08-24 ENCOUNTER — Telehealth: Payer: Self-pay | Admitting: Pharmacy Technician

## 2019-08-24 ENCOUNTER — Other Ambulatory Visit: Payer: Self-pay | Admitting: Pharmacist

## 2019-08-24 ENCOUNTER — Telehealth: Payer: Self-pay | Admitting: Pharmacist

## 2019-08-24 DIAGNOSIS — A319 Mycobacterial infection, unspecified: Secondary | ICD-10-CM

## 2019-08-24 DIAGNOSIS — A318 Other mycobacterial infections: Secondary | ICD-10-CM

## 2019-08-24 MED ORDER — OMADACYCLINE TOSYLATE 150 MG PO TABS: ORAL_TABLET | ORAL | 0 refills | Status: DC

## 2019-08-24 MED ORDER — LINEZOLID 600 MG PO TABS: 600.0000 mg | ORAL_TABLET | Freq: Every day | ORAL | 5 refills | Status: DC

## 2019-08-24 NOTE — Telephone Encounter (Signed)
Thank you Cassie! 

## 2019-08-24 NOTE — Telephone Encounter (Signed)
Elesa Hacker is approved.  Case ID:  [id number]  Through 09/07/2019

## 2019-08-24 NOTE — Telephone Encounter (Signed)
Patient is approved for clofazimine and a 3 month supply has arrived to clinic. Patient has also been approved for linezolid and omadacycline with a $3 copay for each.  Called patient to discuss. He will come next Tuesday to pick up medications here and see me for counseling.   There is a chance his omadacycline will be denied at some point. If that happens, we can transition to inhaled amikacin.

## 2019-08-24 NOTE — Telephone Encounter (Signed)
[facility] Patient Advocate Encounter   Received notification from [employer] that prior authorization for Calvin Schroeder is required.   PA submitted on 08/24/2019 Key [id number] Status is pending and a decision will take 24 hours.    [facility] will continue to follow.   Calvin Schroeder. Calvin Schroeder Patient [facility] Phone: [phone] Fax:  [phone]

## 2019-08-26 ENCOUNTER — Other Ambulatory Visit: Payer: Self-pay

## 2019-08-26 ENCOUNTER — Other Ambulatory Visit: Payer: Self-pay | Admitting: Infectious Diseases

## 2019-08-26 DIAGNOSIS — B20 Human immunodeficiency virus [HIV] disease: Secondary | ICD-10-CM

## 2019-08-26 MED ORDER — TIVICAY 50 MG PO TABS: ORAL_TABLET | ORAL | 3 refills | Status: DC

## 2019-08-26 MED ORDER — DESCOVY 200-25 MG PO TABS: 1.0000 | ORAL_TABLET | Freq: Every day | ORAL | 0 refills | Status: DC

## 2019-08-27 MED FILL — NUZYRA 150 MG TABS: 150 | 30 days supply | Qty: 62 | Fill #0

## 2019-08-27 MED FILL — LINEZOLID 600 MG TAB: 600 | 30 days supply | Qty: 30 | Fill #0

## 2019-08-30 LAB — SPECIMEN STATUS REPORT

## 2019-08-30 LAB — RAPID GROWER BROTH SUSCEP.

## 2019-08-31 ENCOUNTER — Other Ambulatory Visit: Payer: Self-pay

## 2019-08-31 ENCOUNTER — Other Ambulatory Visit: Payer: Self-pay | Admitting: Pharmacist

## 2019-08-31 ENCOUNTER — Ambulatory Visit (INDEPENDENT_AMBULATORY_CARE_PROVIDER_SITE_OTHER): Payer: Medicaid Other | Admitting: Pharmacist

## 2019-08-31 DIAGNOSIS — A318 Other mycobacterial infections: Secondary | ICD-10-CM

## 2019-08-31 DIAGNOSIS — A319 Mycobacterial infection, unspecified: Secondary | ICD-10-CM

## 2019-08-31 DIAGNOSIS — B20 Human immunodeficiency virus [HIV] disease: Secondary | ICD-10-CM

## 2019-08-31 DIAGNOSIS — R918 Other nonspecific abnormal finding of lung field: Secondary | ICD-10-CM

## 2019-08-31 MED ORDER — OMADACYCLINE TOSYLATE 150 MG PO TABS: 300.0000 mg | ORAL_TABLET | Freq: Every day | ORAL | 5 refills | Status: DC

## 2019-08-31 NOTE — Telephone Encounter (Signed)
Patient picked up 3 months of clofazimine today.

## 2019-08-31 NOTE — Progress Notes (Signed)
HPI: Calvin Schroeder is a 59 y.o. 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  Patient Active Problem List   Diagnosis Date Noted  . 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 Medication management 09/02/2018  . OSA (obstructive sleep apnea) 07/01/2018  . Healthcare maintenance 03/30/2018  . Sebaceous cyst 03/30/2018  . HTN (hypertension) 03/30/2018  . HIV (human immunodeficiency virus infection) ([place]) 09/22/2017  . 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 AIDS (acquired immune deficiency syndrome) ([place]) 04/09/2017  . 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 Erythrocytosis 06/10/2013  . Lung mass 06/10/2013  . Tobacco abuse 06/10/2013    Patient's Medications  New Prescriptions   No medications on file  Previous Medications   ALBUTEROL (VENTOLIN HFA) 108 (90 BASE) MCG/ACT INHALER    Inhale 2 puffs into the lungs every 6 (six) hours as needed for wheezing or shortness of breath.   BENZONATATE (TESSALON) 200 MG CAPSULE    Take 1 capsule (200 mg total) by mouth every 6 (six) hours as needed for cough.   CHOLECALCIFEROL 1.25 MG (50000 UT) CAPSULE    Take 50,000 Units by mouth every Wednesday.   CHOLECALCIFEROL 25 MCG (1000 UT) CAPSULE    Take 1,000 Units by mouth daily.   CLOFAZIMINE POWD    100 mg by Does not apply route daily.   DOLUTEGRAVIR (TIVICAY) 50 MG TABLET    TAKE 1 TABLET(50 MG) BY MOUTH DAILY   EMTRICITABINE-TENOFOVIR AF (DESCOVY) 200-25 MG TABLET    Take 1 tablet by mouth daily.   IPRATROPIUM-ALBUTEROL (DUONEB) 0.5-2.5 (3) MG/3ML SOLN    Take 3 mLs by nebulization every 6 (six) hours as needed.   LINEZOLID (ZYVOX) 600 MG TABLET    Take 1 tablet (600 mg total) by mouth daily.   MISC. DEVICES KIT    Flutter Valve   OMADACYCLINE TOSYLATE 150 MG TABS    Take 2 tablets (300 mg) by mouth twice daily for 1 day then 2  tablets (300 mg) by mouth once daily thereafter.   ONDANSETRON (ZOFRAN ODT) 4 MG DISINTEGRATING TABLET    Take 1 tablet (4 mg total) by mouth every 8 (eight) hours as needed for nausea or vomiting.   TIOTROPIUM BROMIDE-OLODATEROL (STIOLTO RESPIMAT) 2.5-2.5 MCG/ACT AERS    Inhale 2 puffs into the lungs daily.  Modified Medications   No medications on file  Discontinued Medications   No medications on file    Allergies: Allergies  Allergen Reactions  . Rifampin     Reports he could not function for 4 days  Other reaction(s): Other Reports he could not function for 4 days     Past Medical History: Past Medical History:  Diagnosis Date  . Back pain   . COPD (chronic obstructive pulmonary disease) ([facility])    emphysema   . Dyspnea    with exertion   . HIV (human immunodeficiency virus infection) ([place])   . 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 Pneumonia    11/2016  . Tobacco abuse 06/10/2013    Social History: Social History   Socioeconomic History  . Marital status: Single    Spouse name: Not on file  . Number of children: Not on file  . Years of education: Not on file  . Highest education level: Not on file  Occupational History  . Not on file  Tobacco Use  .  Smoking status: Current Every Day Smoker    Packs/day: 1.00    Years: 40.00    Pack years: 40.00  . Smokeless tobacco: Never Used  . Tobacco comment: 1 pack per day currently, quit date set for 12/23/17 per pt   Vaping Use  . Vaping Use: Never used  Substance and Sexual Activity  . Alcohol use: Yes    Comment: daily - 3-4 alcohol  . Drug use: Not Currently    Types: Marijuana    Comment: daily   . Sexual activity: Not Currently    Partners: Female  Other Topics Concern  . Not on file  Social History Narrative  . Not on file   Social Determinants of Health   Financial Resource Strain:   . Difficulty of Paying Living Expenses: Not on file  Food Insecurity:   . Worried About Charity fundraiser in the Last  Year: Not on file  . Ran Out of Food in the Last Year: Not on file  Transportation Needs:   . Lack of Transportation (Medical): Not on file  . Lack of Transportation (Non-Medical): Not on file  Physical Activity:   . Days of Exercise per Week: Not on file  . Minutes of Exercise per Session: Not on file  Stress:   . Feeling of Stress : Not on file  Social Connections:   . Frequency of Communication with Friends and Family: Not on file  . Frequency of Social Gatherings with Friends and Family: Not on file  . Attends Religious Services: Not on file  . Active Member of Clubs or Organizations: Not on file  . Attends Archivist Meetings: Not on file  . Marital Status: Not on file    Labs: Lab Results  Component Value Date   HIV1RNAQUANT 63 (H) 12/10/2018   HIV1RNAQUANT <20 DETECTED (A) 03/16/2018   HIV1RNAQUANT <20 NOT DETECTED 11/05/2017   CD4TABS 256 (L) 12/10/2018   CD4TABS 230 (L) 03/16/2018   CD4TABS 240 (L) 11/05/2017    RPR and STI Lab Results  Component Value Date   LABRPR NON-REACTIVE 12/10/2018   LABRPR NON-REACTIVE 03/16/2018   LABRPR NON-REACTIVE 03/17/2017    STI Results GC CT  03/16/2018 Negative Negative  03/17/2017 Negative Negative    Hepatitis B Lab Results  Component Value Date   HEPBSAB REACTIVE (A) 03/17/2017   HEPBSAG NON-REACTIVE 03/17/2017   HEPBCAB NON-REACTIVE 03/17/2017   Hepatitis C Lab Results  Component Value Date   HEPCAB NON-REACTIVE 03/17/2017   Hepatitis A Lab Results  Component Value Date   HAV NON-REACTIVE 03/17/2017   Lipids: Lab Results  Component Value Date   CHOL 164 12/10/2018   TRIG 42 12/10/2018   HDL 77 12/10/2018   CHOLHDL 2.1 12/10/2018   LDLCALC 75 12/10/2018    Current HIV Regimen: Tivicay + Descovy  M. Abscessus Regimen: Omadacycline Linezolid Clofazimine  Assessment: Calvin Schroeder is here today to initiate medications for his Mycobacterium abscessus pulmonary infection. He will start  omadacycline, linezolid, and clofazimine. Advised that he will need to get refills from [facility] for the omadacycline and linezolid, but that the clofazimine refills will come from me.  Counseled patient to take TWO clofazimine capsules (100 mg) together once daily. Advised patient not to separate capsules and to make sure to take them together. Explained that the tablets are 50 mg each but the dose needed is 100 mg, therefore the need to take two capsules together. Encouraged patient not to miss any doses  and to continue taking until discontinued.  Counseled patient on what to do if dose is missed - if it is closer to the missed dose take immediately; if closer to next dose skip dose and take the next dose at the usual time. Counseled patient that side effects are usually observed on higher doses but that common side effects include GI upset with nausea and diarrhea and taking clofazimine with food may help the upset stomach. Counseled patient that clofazimine should also be taken with a full glass of water. Also counseled patient that medication can darken skin and other secretions such as tears, saliva, and urine. Advised patient to avoid direct sun exposure while on clofazimine as the medication can increase sun sensitivity.  Asked that the patient wear sunscreen, a hat, and long sleeves while outside.  Other common side effects include skin dryness and liver toxicity but advised patient that we will be monitoring liver function throughout therapy. All side effects tend to resolve after discontinuation of clofazimine. Answered all questions. Gave patient 2 bottles of clofazimine which is a 3 months's supply. Asked patient to call me when they are down to only half of a bottle left.   Counseled patient to take TWO omadacycline tablets (300 mg) together twice daily x 1 day then TWO tablets (300 mg) by mouth once daily thereafter. Counseled patient that medication needs to be taken on an  empty stomach and with a full glass of water. Also counseled that it must be taken after at least 4 hours of fasting and to wait at least 2 more hours after taking to eat any food. Counseled to avoid milk or antacids for 4 hours after taking as well. Advised patient that common side effects include nausea and vomiting and to let us know if this is a problem. Explained that we would be drawing labs periodically to check on kidney and liver function. Advised patient to stay out of the sun for extended periods of time and to wear sunscreen and cover their face and skin as omadacycline can cause photosensitivity. Encouraged patient not to miss any doses and to continue taking until discontinued. Counseled patient on what to do if dose is missed - if it is closer to the missed dose take immediately; if closer to next dose skip dose and take the next dose at the usual time. Patient will call me if any issues arise.  Also counseled patient to take linezolid 600 mg once daily. Counseled that he can take it with or without food and that it was fine to take with his other medications. Advised him that we would be checking labs while on this medication and to let us know if he has issues tolerating it.  Gave him one month of linezolid, one month of omadacycline, and 3 month's of clofazimine. I advised him that there could be a chance that his insurance will eventually deny him getting more omadacycline refills. If this occurs, we will transition to trying to get [employer] approved. He is on board with the plan. He sees Dr. [name] in ~3 weeks.  Plan: - Start omadacycline 300 mg PO BID x 1 day then 300 mg PO daily on an empty stomach - Start clofazimine 100 mg PO once daily - Start linezolid 600 mg PO once daily - Continue Tivicay and Descovy - F/u with Dr. [name] 9/13 at 845am  Jennamarie Goings L. Taylah Dubiel, PharmD, BCIDP, AAHIVP, [place] Clinical Pharmacist Practitioner [facility]  08/31/2019, 9:13 AM

## 2019-09-06 ENCOUNTER — Ambulatory Visit: Payer: Medicaid Other | Admitting: Pulmonary Disease

## 2019-09-09 NOTE — Progress Notes (Signed)
_0  ID: Calvin Schroeder, male    DOB: 1960/06/21, 59 y.o.   MRN: [id number]  Chief Complaint  Patient presents with  . Follow-up    Pt states he has been doing okay since last visit. Pt does have an occassional cough. Denies any worsening SOB.    Referring provider: Elinor Parkinson  HPI:  59 year old male current smoker referred in March/2019 for lung mass and emphysema.  Lung mass was biopsied 02/07/2017 by Dr. 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  Patient was also diagnosed with HIV in 2019. 03/2017 he was not tolerating MAI treatment due to GI side effects.  Hope to restart treatment when CD4 counts are above 200.  PMH: HIV positive (followed by Dr. [name] with infectious disease) Smoker/ Smoking History: Current smoker. 40 pack year. 1 ppd.  Maintenance: Stiolto resp Pt of: Dr. Carlis Abbott   09/10/2019  - Visit   59 year old male current smoker followed in our office for MAI.  Last seen in our office in June/2021 by Dr. Carlis Abbott.  Plan of care at that point in time was for patient to follow-up in 2 months.  Is emphasized that he should stop smoking.  Continue Stiolto.  Repeat sputum culture was obtained.  He was encouraged to have ophthalmology follow-up.  Patient was encouraged to remain on daily ethambutol and azithromycin.  He needs a follow-up CT chest in December/2021. Patient was seen by Dr. [name] on 08/12/2019.  Patient was encouraged to follow back up in 3 to 4 weeks.  They are planning on switching patient to clofazimine, amikacin inhaled and Linezolid.  They will wait giving him these until they are all approved.  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  It was recommended that he obtain the shingles vaccine, he was given the meningitis vaccine.  He was encouraged to follow-up in 4 weeks.  Patient reporting to our office today reports that he attempted to start the Clofazimine as managed by infectious disease.  He  recently stopped taking it.  He was only able to tolerate 2 doses.  He reports that he had profound nausea, vomiting, diarrhea.  He has not followed up with infectious disease to notify them of the symptoms.  We will discuss this today.  Patient is down to 10 cigarettes a day.  He has not set a quit date.  He is not ready to set a quit date.  He has received his COVID-19 booster vaccine.  He has not received the shingles vaccine.  He would like the seasonal flu vaccine today.  Tests:   06/09/2017-CD4-260  09/09/2017-chest x-ray- eczema redemonstration of chronic cavitation in left upper lobe with coarsened interstitial markings and fibrosis favored to represent tuberculosis infection though difficult to entirely exclude tuberculosis  04/22/2017-CT chest with contrast- similar appearance of left upper lobe cavitary lung mass, findings are favored to be related to an ongoing atypical Mycobacterium infectious process, new right middle lobe infectious nodularity mild right hilar adenopathy, 4 mm right upper lobe pulmonary nodule is new  >>>consider noncontrast CT in 12 months  February 2020 CT chest images showing decreased size of the thick-walled cavitary lesion in the left upper lobe, several other scattered nodules some of which are cavitary, also stable to decreased in size, centrilobular emphysema noted  08/24/2018-CT chest without contrast-extensive underlying emphysematous change, mild bronchiectasis, nodular lesions with dominant cavitary lesion in the left upper lobe remain, slightly more thickening in the periphery  of the left upper lobe nodular lesion and to a lesser extent slight increase in thickening medially, no new cavitation evident, slow-growing neoplasm must be of concern, this area may warrant bronchoscopy and/or tissue sampling to further evaluate, at a minimum a follow-up CT in approximately 3 months advised to further evaluate, other nodular areas in the right upper lobe show marginal  increase in size compared to most recent study, scattered subcentimeter lymph nodes without adenopathy by size criteria, occasional foci of aortic arthrosclerosis, Left upper lobe cavitaryLesion 8.5 x 5.1 cm, slightly enlarged.  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 (L/min) No  Tech Comments: pt walked a moderate pace, on forehead probe, tolerated walk well.     Imaging: No results found.  Lab Results:  CBC    Component Value Date/Time   WBC 3.0 (L) 07/02/2019 0811   RBC 4.73 07/02/2019 0811   HGB 17.2 (H) 07/02/2019 0811   HGB 14.3 12/10/2016 1042   HCT 47.1 07/02/2019 0811   HCT 42.1 12/10/2016 1042   PLT 176 07/02/2019 0811   PLT 121 (L) 12/10/2016 1042   MCV 99.6 07/02/2019 0811   MCV 98.9 (H) 12/10/2016 1042   MCH 36.4 (H) 07/02/2019 0811   MCHC 36.5 (H) 07/02/2019 0811   RDW 11.8 07/02/2019 0811   RDW 12.6 12/10/2016 1042   LYMPHSABS 0.7 07/02/2019 0811   LYMPHSABS 0.7 (L) 12/10/2016 1042   MONOABS 0.7 07/02/2019 0811   MONOABS 0.4 12/10/2016 1042   EOSABS 0.0 07/02/2019 0811   EOSABS 0.1 12/10/2016 1042   BASOSABS 0.0 07/02/2019 0811   BASOSABS 0.0 12/10/2016 1042    BMET    Component Value Date/Time   NA 129 (L) 07/02/2019 0811   NA 137 12/10/2016 1042   K 3.9 07/02/2019 0811   K 4.4 12/10/2016 1042   CL  91 (L) 07/02/2019 0811   CO2 30 07/02/2019 0811   CO2 28 12/10/2016 1042   GLUCOSE 110 (H) 07/02/2019 0811   GLUCOSE 96 12/10/2016 1042   BUN 10 07/02/2019 0811   BUN 9.7 12/10/2016 1042   CREATININE 0.83 07/02/2019 0811   CREATININE 0.90 12/10/2018 0907   CREATININE 0.8 12/10/2016 1042   CALCIUM 8.5 (L) 07/02/2019 0811   CALCIUM 8.7 12/10/2016 1042   GFRNONAA >60 07/02/2019 0811   GFRNONAA 98 03/16/2018 0842   GFRAA >60 07/02/2019 0811   GFRAA 113 03/16/2018 0842    BNP No results found for: BNP  ProBNP No results found for: PROBNP  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 though difficult to entirely exclude tuberculosis  04/22/2017-CT chest with contrast- similar appearance of left upper lobe cavitary lung mass, findings are favored to be related to an ongoing  atypical Mycobacterium infectious process, new right middle lobe infectious nodularity mild right hilar adenopathy, 4 mm right upper lobe pulmonary nodule is new consider noncontrast CT in 12 months  February 2020 CT chest images showing decreased size of the thick-walled cavitary lesion in the left upper lobe, several other scattered nodules some of which are cavitary, also stable to decreased in size, centrilobular emphysema noted  08/24/2018-CT chest without contrast-extensive underlying emphysematous change, mild bronchiectasis, nodular lesions with dominant cavitary lesion in the left upper lobe remain, slightly more thickening in the periphery of the left upper lobe nodular lesion and to a lesser extent slight increase in thickening medially, no new cavitation evident, slow-growing neoplasm must be of concern, this area may warrant bronchoscopy and/or tissue sampling to further evaluate, at a minimum a follow-up CT in  approximately 3 months advised to further evaluate, other nodular areas in the right upper lobe show marginal increase in size compared to most recent study, scattered subcentimeter lymph nodes without adenopathy by size criteria, occasional foci of aortic arthrosclerosis, Left upper lobe cavitaryLesion 8.5 x 5.1 cm, slightly enlarged.       GOLD COPD II B    06/11/2017- spirometry-FVC 3.63 (73% predicted), ratio 65, FEV1 - 61 >>>moderate airflow obstruction       OSA (obstructive sleep apnea)     06/08/2018 - split night sleep study- Mild obstructive sleep apnea occurred during this study (AHI =  5.2/h, RDI 13.7/h)   RECOMMENDATIONS  - Positional therapy avoiding supine position during sleep.  - Very mild obstructive sleep apnea. The cardiovascular  significance of mild OSA is minimal. Treatment options include  no treatment since his ESS is 0 or oral appliance / CPAP therapy  if very symptomatic         Allergies  Allergen Reactions  . Rifampin     Reports he could not function for 4 days  Other reaction(s): Other Reports he could not function for 4 days     Immunization History  Administered Date(s) Administered  . Hepatitis A, Adult 04/09/2017, 07/28/2017  . Influenza,inj,Quad PF,6+ Mos 04/09/2017, 10/21/2017, 09/02/2018  . Meningococcal Mcv4o 08/12/2019  . Moderna SARS-COVID-2 Vaccination 01/20/2019, 02/17/2019, 08/25/2019  . Pneumococcal Conjugate-13 06/09/2017  . Pneumococcal Polysaccharide-23 04/09/2017  . Pneumococcal-Unspecified 04/09/2017  . Tdap 07/03/2018    Past Medical History:  Diagnosis Date  . Back pain   . COPD (chronic obstructive pulmonary disease) (HCC)    emphysema   . Dyspnea    with exertion   . HIV (human immunodeficiency virus infection) ([place])   . 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 Pneumonia    11/2016  . Tobacco abuse 06/10/2013    Tobacco History: Social History   Tobacco Use  Smoking Status Current Every Day Smoker  . Packs/day:  1.00  . Years: 40.00  . Pack years: 40.00  Smokeless Tobacco Never Used  Tobacco Comment   1/2 ppd as of 09/10/19   Ready to quit: No Counseling given: Yes Comment: 1/2 ppd as of 09/10/19  Smoking assessment and cessation counseling  Patient currently smoking: 10 cigarettes a day I have advised the patient to quit/stop smoking as soon as possible due to high risk for multiple medical problems.  It will also be very difficult for [place] to manage patient's  respiratory symptoms and status if we continue to expose her lungs to a known irritant.  We do not advise e-cigarettes as a form of stopping smoking.  Patient is not willing to  quit smoking.  Praised patient on progress today.  Encourage patient to set quit date.  Patient reports he is not willing to set a quit date at this time.  I have advised the patient that we can assist and have options of nicotine replacement therapy, provided smoking cessation education today, provided smoking cessation counseling, and provided cessation resources.  Follow-up next office visit office visit for assessment of smoking cessation.    Smoking cessation counseling advised for: 5 minutes   Outpatient Encounter Medications as of 09/10/2019  Medication Sig  . albuterol (VENTOLIN HFA) 108 (90 Base) MCG/ACT inhaler Inhale 2 puffs into the lungs every 6 (six) hours as needed for wheezing or shortness of breath.  . benzonatate (TESSALON) 200 MG capsule Take 1 capsule (200 mg total) by mouth every 6 (six) hours as needed for cough.  . Cholecalciferol 1.25 MG (50000 UT) capsule Take 50,000 Units by mouth every Wednesday.  . Cholecalciferol 25 MCG (1000 UT) capsule Take 1,000 Units by mouth daily.  . Clofazimine POWD 100 mg by Does not apply route daily.  . dolutegravir (TIVICAY) 50 MG tablet TAKE 1 TABLET(50 MG) BY MOUTH DAILY  . emtricitabine-tenofovir AF (DESCOVY) 200-25 MG tablet Take 1 tablet by mouth daily.  Marland Kitchen ipratropium-albuterol (DUONEB) 0.5-2.5 (3)  MG/3ML SOLN Take 3 mLs by nebulization every 6 (six) hours as needed. (Patient taking differently: Take 3 mLs by nebulization every 6 (six) hours as needed (shortness of breath/wheezing). )  . linezolid (ZYVOX) 600 MG tablet Take 1 tablet (600 mg total) by mouth daily.  . Misc. Devices KIT Flutter Valve  . Omadacycline Tosylate 150 MG TABS Take 300 mg by mouth daily.  . ondansetron (ZOFRAN ODT) 4 MG disintegrating tablet Take 1 tablet (4 mg total) by mouth every 8 (eight) hours as needed for nausea or vomiting.  Marland Kitchen oxyCODONE-acetaminophen (PERCOCET) 10-325 MG tablet Take 1 tablet by mouth every 6 (six) hours as needed.  . Tiotropium Bromide-Olodaterol (STIOLTO RESPIMAT) 2.5-2.5 MCG/ACT AERS Inhale 2 puffs into the lungs daily.   No facility-administered encounter medications on file as of 09/10/2019.     Review of Systems  Review of Systems  Constitutional: Negative for activity change, chills, fatigue, fever and unexpected weight change.  HENT: Negative for postnasal drip, rhinorrhea, sinus pressure, sinus pain and sore throat.   Eyes: Negative.   Respiratory: Positive for cough and shortness of breath. Negative for wheezing.   Cardiovascular: Negative for chest pain and palpitations.  Gastrointestinal: Positive for diarrhea, nausea and vomiting. Negative for constipation.       Felt to be related to new meds from infectious disease  Endocrine: Negative.   Genitourinary: Negative.   Musculoskeletal: Negative.   Skin: Negative.   Neurological: Negative for dizziness and headaches.  Psychiatric/Behavioral: Negative.  Negative for dysphoric mood. The patient is not nervous/anxious.   All other systems reviewed and are negative.    Physical Exam  BP 122/80 (BP Location: Left Arm, Cuff Size: Normal)   Pulse 75   Ht _0  (1.778 m)   Wt 143 lb 6.4 oz (65 kg)   SpO2 98%   BMI 20.58 kg/m   Wt Readings from Last 5 Encounters:  09/10/19 143 lb 6.4 oz (65 kg)  08/12/19 142 lb (64.4 kg)   07/06/19 141 lb 12.8 oz (64.3 kg)  07/02/19 143 lb 4.8 oz (65 kg)  06/22/19 143 lb 9.6 oz (65.1 kg)    BMI Readings from Last 5 Encounters:  09/10/19 20.58 kg/m  08/12/19 20.37 kg/m  07/06/19 20.35 kg/m  07/02/19 20.56 kg/m  06/22/19 20.60 kg/m     Physical Exam Vitals and nursing note reviewed.  Constitutional:      General: He is not in acute distress.    Appearance: Normal appearance. He is normal weight.     Comments: Thin frail adult male  HENT:     Head: Normocephalic and atraumatic.     Right Ear: Hearing, tympanic membrane, ear canal and external ear normal. There is impacted cerumen.     Left Ear: Hearing, tympanic membrane, ear canal and external ear normal. There is impacted cerumen.     Nose: Nose normal. No mucosal edema or rhinorrhea.     Right Turbinates: Not enlarged.     Left Turbinates: Not enlarged.     Mouth/Throat:     Mouth: Mucous membranes are dry.     Pharynx: Oropharynx is clear. No oropharyngeal exudate.  Eyes:     Pupils: Pupils are equal, round, and reactive to light.  Cardiovascular:     Rate and Rhythm: Normal rate and regular rhythm.     Pulses: Normal pulses.     Heart sounds: Normal heart sounds. No murmur heard.   Pulmonary:     Effort: Pulmonary effort is normal.     Breath sounds: Normal breath sounds. No decreased breath sounds, wheezing or rales.  Musculoskeletal:     Cervical back: Normal range of motion.     Right lower leg: No edema.     Left lower leg: No edema.  Lymphadenopathy:     Cervical: No cervical adenopathy.  Skin:    General: Skin is warm and dry.     Capillary Refill: Capillary refill takes less than 2 seconds.     Findings: No erythema or rash.  Neurological:     General: No focal deficit present.     Mental Status: He is alert and oriented to person, place, and time.     Motor: No weakness.     Coordination: Coordination normal.     Gait: Gait is intact. Gait normal.  Psychiatric:        Mood and  Affect: Mood normal.        Behavior: Behavior normal. Behavior is cooperative.        Thought Content: Thought content normal.        Judgment: Judgment normal.       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 Carlis Abbott after [place] maintenance Plan: Seasonal flu vaccine today    Return in about 3 months (around 12/10/2019), or if symptoms worsen or fail to improve, for Follow up with Dr. Carlis Abbott, After Chest CT.   Lauraine Rinne, NP 09/10/2019   This appointment required 32 minutes of patient care (this includes precharting, chart review, review of results, face-to-face care, etc.).

## 2019-09-10 ENCOUNTER — Ambulatory Visit (INDEPENDENT_AMBULATORY_CARE_PROVIDER_SITE_OTHER): Payer: Medicaid Other | Admitting: Pulmonary Disease

## 2019-09-10 ENCOUNTER — Other Ambulatory Visit: Payer: Self-pay

## 2019-09-10 ENCOUNTER — Encounter: Payer: Self-pay | Admitting: Pulmonary Disease

## 2019-09-10 VITALS — BP 122/80 | HR 75 | Ht 70.0 in | Wt 143.4 lb

## 2019-09-10 DIAGNOSIS — A319 Mycobacterial infection, unspecified: Secondary | ICD-10-CM

## 2019-09-10 DIAGNOSIS — A318 Other mycobacterial infections: Secondary | ICD-10-CM

## 2019-09-10 DIAGNOSIS — Z720 Tobacco use: Secondary | ICD-10-CM

## 2019-09-10 DIAGNOSIS — Z0000 Encounter for general adult medical examination without abnormal findings: Secondary | ICD-10-CM

## 2019-09-10 DIAGNOSIS — Z23 Encounter for immunization: Secondary | ICD-10-CM | POA: Diagnosis not present

## 2019-09-10 DIAGNOSIS — F17210 Nicotine dependence, cigarettes, uncomplicated: Secondary | ICD-10-CM

## 2019-09-10 DIAGNOSIS — G4733 Obstructive sleep apnea (adult) (pediatric): Secondary | ICD-10-CM

## 2019-09-10 DIAGNOSIS — J439 Emphysema, unspecified: Secondary | ICD-10-CM | POA: Diagnosis not present

## 2019-09-10 DIAGNOSIS — C469 Kaposi's sarcoma, unspecified: Secondary | ICD-10-CM | POA: Diagnosis not present

## 2019-09-10 NOTE — Assessment & Plan Note (Signed)
Plan: Seasonal flu vaccine today 

## 2019-09-10 NOTE — Assessment & Plan Note (Signed)
Patient reporting today that he is intolerant of clofazimine  Plan: We will route note to infectious disease team Encourage patient to contact infectious disease team regarding symptoms Reviewed with him that he has multidrug-resistant sputum and he needs to be informing medical treatment teams if he is intolerant of the medications were prescribing We will repeat CT chest in December/2021 Follow-up with Dr. Carlis Abbott after CT

## 2019-09-10 NOTE — Patient Instructions (Addendum)
You were seen today by Lauraine Rinne, NP  for:   1. 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 Pulmonary emphysema, unspecified emphysema type (HCC)  Stiolto Respimat inhaler >>>2 puffs daily >>>Take this no matter what >>>This is not a rescue inhaler  3. Tobacco abuse  We recommend that you stop smoking.  >>>You need to set a quit date >>>If you have friends or family who smoke, let them know you are trying to quit and not to smoke around you or in your living environment  Smoking Cessation Resources:  1 800 QUIT NOW  >>> Patient to call this resource and utilize it to help support her quit smoking >>> Keep up your hard work with stopping smoking  You can also contact the [facility] >>>For smoking cessation classes call [phone]  We do not recommend using e-cigarettes as a form of stopping smoking  You can sign up for smoking cessation support texts and information:  >>>https://smokefree.gov/smokefreetxt   4. 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 Healthcare maintenance  Seasonal flu vaccine today  Please follow-up with infectious disease about the shingles vaccine  6. OSA (obstructive sleep apnea)  We recommend that you continue using your CPAP daily >>>Keep up the hard work using your device >>> Goal should be wearing this for the entire night that you are sleeping, at least 4 to 6 hours  Remember:  . Do not drive or operate heavy machinery if tired or drowsy.  . Please notify the supply company and office if you are unable to use your device regularly due to missing supplies or machine being broken.  . Work on maintaining a healthy weight and following your  recommended nutrition plan  . Maintain proper daily exercise and movement  . Maintaining proper use of your device can also help improve management of other chronic illnesses such as: Blood pressure, blood sugars, and weight management.   BiPAP/ CPAP Cleaning:  >>>Clean weekly, with Dawn soap, and bottle brush.  Set up to air dry. >>> Wipe mask out daily with wet wipe or towelette   Follow Up:    Return in about 3 months (around 12/10/2019), or if symptoms worsen or fail to improve, for Follow up with Dr. Carlis Abbott, After Chest CT.   Notification of test results are managed in the following manner: If there are  any recommendations or changes to the  plan of care discussed in office today,  we will contact you and let you know what they are. If you do not hear from [place], then your results are normal and you can view them through your  MyChart account , or a letter will be sent to you. Thank you again for trusting [place] with your care  - Thank you, [facility]    It is flu season:   >>> Best ways to protect herself from the flu: Receive the yearly flu vaccine, practice good hand hygiene washing with soap and also using hand sanitizer when available, eat a nutritious meals, get adequate rest, hydrate appropriately       Please contact the office if your symptoms worsen or you have concerns that you are not improving.   Thank you for choosing [facility] for your healthcare, and for allowing [place] to partner with you on your healthcare journey. I am thankful to be able to provide care to you today.   [name] FNP-C

## 2019-09-10 NOTE — Assessment & Plan Note (Signed)
Plan: Continue Stiolto Respimat

## 2019-09-10 NOTE — Assessment & Plan Note (Signed)
Mild obstructive sleep apnea Patient reports intermittent CPAP use  Plan: Resume CPAP use

## 2019-09-10 NOTE — Assessment & Plan Note (Signed)
Plan: Continue infectious disease management Continue Derm follow-up

## 2019-09-13 LAB — AFB CULTURE WITH SMEAR (NOT AT ARMC)
Acid Fast Culture: NEGATIVE
Acid Fast Smear: NEGATIVE

## 2019-09-17 ENCOUNTER — Telehealth: Payer: Self-pay

## 2019-09-17 NOTE — Telephone Encounter (Signed)
COVID-19 Pre-Screening Questions:09/17/19  Do you currently have a fever (>100 F), chills or unexplained body aches?NO  Are you currently experiencing new cough, shortness of breath, sore throat, runny nose? NO .  Have you been in contact with someone that is currently pending confirmation of Covid19 testing or has been confirmed to have the [place] virus?  NO  I have advised patient to call back to reschedule or convert to a MyChart visit if any Covid symptoms appear during the weekend.  **If the patient answers NO to ALL questions -  advise the patient to please call the clinic before coming to the office should any symptoms develop.

## 2019-09-20 ENCOUNTER — Other Ambulatory Visit: Payer: Self-pay

## 2019-09-20 ENCOUNTER — Telehealth: Payer: Self-pay | Admitting: Pharmacy Technician

## 2019-09-20 ENCOUNTER — Ambulatory Visit (INDEPENDENT_AMBULATORY_CARE_PROVIDER_SITE_OTHER): Payer: Medicaid Other | Admitting: Infectious Diseases

## 2019-09-20 ENCOUNTER — Other Ambulatory Visit: Payer: Self-pay | Admitting: Pharmacist

## 2019-09-20 ENCOUNTER — Encounter: Payer: Self-pay | Admitting: Infectious Diseases

## 2019-09-20 DIAGNOSIS — B20 Human immunodeficiency virus [HIV] disease: Secondary | ICD-10-CM

## 2019-09-20 DIAGNOSIS — I10 Essential (primary) hypertension: Secondary | ICD-10-CM | POA: Diagnosis present

## 2019-09-20 DIAGNOSIS — A310 Pulmonary mycobacterial infection: Secondary | ICD-10-CM

## 2019-09-20 MED ORDER — ARIKAYCE 590 MG/8.4ML IN SUSP: 590.0000 mg | Freq: Every day | RESPIRATORY_TRACT | 11 refills | Status: DC

## 2019-09-20 MED FILL — ARIKAYCE 590 MG/8.4ML SUSP: 590 | 28 days supply | Qty: 235 | Fill #0

## 2019-09-20 NOTE — Progress Notes (Signed)
Subjective:    Patient ID: Calvin Schroeder, male    DOB: [birth year], 59 y.o.   MRN: [id number]  HPI  59yo M previously found to have cystic lesion on chest CT on adm for PNA. 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 He was seen in [place] found to have AIDS.  He was started on E/A/R however developed n/v (that persisted despite zofran) RestartedMAI rx 11-05-17.He states he took this starting in Dec.  He was started on tivicay-descovey. He has had f/u with pulm for COPD. He has also f/u with CVTS for a cyctic mass in LUL. His last CVTS f/u was 06-2019- He had repeat CT at that time that showed: 1. The large thick-walled, cavitary left upper lobe lesion has not significantly changed in size from the most recent CT and is most consistent with postinflammatory scarring given relative stability for several years. 2. There is increased multifocal nodularity at the left apex superior to this cavitary lesion, which appears inflammatory. Continued follow-up in 6-12 months suggested. 3. Additional bilateral pulmonary nodules are stable. No adenopathy or pleural effusion. 4. Aortic Atherosclerosis (ICD10-I70.0) and Emphysema (ICD10-J43.9).  He had f/u appt with Pulm 07-09-19 and had repeat Cx showing M abscessus.     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  Doxycycline 16.0 ug/mL Resistant   Imipenem Comment   Comment: 8.0 ug/mL Intermediate  Linezolid Comment   Comment: 1.0 ug/mL or less, Susceptible  Minocycline >8.0 ug/mL Resistant   Moxifloxacin Comment   Comment: 2.0 ug/mL Intermediate  Tigecycline 0.25 ug/mL   Tobramycin CANCELED   Comment: Test not performed   Result canceled by the ancillary.   Trimethoprim/Sulfa Comment   Comment: >8/152 ug/mL  Resistant   He has also been seen by Onc for [place]. He was seen in ID and by pharm in August- his MAC therapy was changed to  clofazamine (which he has not been able to tolerate due to n/v), linezolid and omdacycline. He has not started the omdacycline because he was at his daughter's wedding and he didn't want to feel poorly.  He denies any issues with his tivicay-descovy.  No problems with his breathing.   He asks about inhaled rx instead of clofazamine.  Has lab appt 09-22-19. -    HIV 1 RNA Quant (copies/mL)  Date Value  12/10/2018 63 (H)  03/16/2018 <20 DETECTED (A)  11/05/2017 <20 NOT DETECTED   CD4 T Cell Abs (/uL)  Date Value  12/10/2018 256 (L)  03/16/2018 230 (L)  11/05/2017 240 (L)      Review of Systems  Constitutional: Negative for appetite change, chills, fever and unexpected weight change.  Respiratory: Negative for cough and shortness of breath.   Gastrointestinal: Positive for nausea and vomiting. Negative for constipation and diarrhea.  Genitourinary: Negative for difficulty urinating.  Please see HPI. All other systems reviewed and negative.      Objective:   Physical Exam Vitals reviewed.  Constitutional:      General: He is not in acute distress.    Appearance: Normal appearance. He is not toxic-appearing.  HENT:     Mouth/Throat:     Mouth: Mucous membranes are moist.     Pharynx: No oropharyngeal exudate.  Eyes:     Extraocular Movements: Extraocular movements intact.  Pupils: Pupils are equal, round, and reactive to light.  Cardiovascular:     Rate and Rhythm: Normal rate and regular rhythm.  Pulmonary:     Effort: Pulmonary effort is normal.     Breath sounds: Rhonchi present.  Abdominal:     General: Bowel sounds are normal. There is no distension.     Palpations: Abdomen is soft.     Tenderness: There is no abdominal tenderness.  Musculoskeletal:        General: Normal range of motion.     Cervical back: Normal range of motion and  neck supple.     Right lower leg: No edema.     Left lower leg: No edema.  Neurological:     General: No focal deficit present.     Mental Status: He is alert.  Psychiatric:        Mood and Affect: Mood normal.           Assessment & Plan:

## 2019-09-20 NOTE — Telephone Encounter (Signed)
[facility] Patient Advocate Encounter   Received notification from [facility] Managed care that prior authorization for Amikacin is required.   PA submitted on 09/20/2019 Status is pending  A decision will be reached within 24 hours.    [facility] will continue to follow.   Venida Jarvis. Nadara Mustard [place] Patient [facility] for Infectious Disease Phone: [phone] Fax:  [phone]

## 2019-09-20 NOTE — Assessment & Plan Note (Addendum)
Will continue his current ART for now.  Hopefully to [place] at some point.  Offered/refused condoms.  Has gotten COVID and booster, flu.  rtc in 4-6 weeks

## 2019-09-20 NOTE — Telephone Encounter (Signed)
[facility] Patient Advocate Encounter  Left a HIPPA compliant voicemail with the patient.  When they return call, I need to ask if our pharmacy can ship Arikayce to his home and to confirm address.  He will have a $3 copay.  Our clinic pharmacist will call and counsel him on how to inhale the medication.    Venida Jarvis. Nadara Mustard [place] Patient [facility] for Infectious Disease Phone: [phone] Fax:  [phone]

## 2019-09-20 NOTE — Assessment & Plan Note (Signed)
Elevated today but asx Will recheck prior to leaving.  To f/u with PCP

## 2019-09-20 NOTE — Progress Notes (Signed)
Patient BP elevated due to current pain level 10 of 10. Patient states he follow up with pain management and has not taken his pain medication this morning.  Eugenia Mcalpine

## 2019-09-20 NOTE — Assessment & Plan Note (Signed)
My great apprciation to pharmacy.  Will try to switch clofazamine to inhaled amikacin. He has a nebulizer.

## 2019-09-21 NOTE — Telephone Encounter (Signed)
Left 2nd voicemail today.  Calvin Schroeder. [name] Patient [facility] for Infectious Disease Phone: [phone] Fax:  [phone]

## 2019-09-22 ENCOUNTER — Other Ambulatory Visit: Payer: Self-pay

## 2019-09-22 ENCOUNTER — Other Ambulatory Visit: Payer: Medicaid Other

## 2019-09-22 ENCOUNTER — Other Ambulatory Visit (HOSPITAL_COMMUNITY)
Admission: RE | Admit: 2019-09-22 | Discharge: 2019-09-22 | Disposition: A | Discharge status: Home / Self Care | Payer: Medicaid Other | Source: Ambulatory Visit | Attending: Infectious Diseases | Admitting: Infectious Diseases

## 2019-09-22 DIAGNOSIS — Z113 Encounter for screening for infections with a predominantly sexual mode of transmission: Secondary | ICD-10-CM | POA: Diagnosis not present

## 2019-09-22 DIAGNOSIS — Z79899 Other long term (current) drug therapy: Secondary | ICD-10-CM

## 2019-09-22 DIAGNOSIS — B20 Human immunodeficiency virus [HIV] disease: Secondary | ICD-10-CM

## 2019-09-23 ENCOUNTER — Encounter: Payer: Self-pay | Admitting: Physical Therapy

## 2019-09-23 ENCOUNTER — Ambulatory Visit: Payer: Medicaid Other | Attending: Nurse Practitioner | Admitting: Physical Therapy

## 2019-09-23 DIAGNOSIS — M25612 Stiffness of left shoulder, not elsewhere classified: Secondary | ICD-10-CM | POA: Diagnosis present

## 2019-09-23 DIAGNOSIS — M542 Cervicalgia: Secondary | ICD-10-CM | POA: Diagnosis present

## 2019-09-23 DIAGNOSIS — M5413 Radiculopathy, cervicothoracic region: Secondary | ICD-10-CM | POA: Diagnosis present

## 2019-09-23 DIAGNOSIS — M25512 Pain in left shoulder: Secondary | ICD-10-CM | POA: Diagnosis present

## 2019-09-23 LAB — T-HELPER CELL (CD4) - (RCID CLINIC ONLY)
CD4 % Helper T Cell: 25 % — ABNORMAL LOW (ref 33–65)
CD4 T Cell Abs: 292 /uL — ABNORMAL LOW (ref 400–1790)

## 2019-09-23 LAB — URINE CYTOLOGY ANCILLARY ONLY
Chlamydia: NEGATIVE
Comment: NEGATIVE
Comment: NORMAL
Neisseria Gonorrhea: NEGATIVE

## 2019-09-23 NOTE — Patient Instructions (Signed)
Access Code: [email or web address] URL: [email or web address] Date: 09/23/2019 Prepared by: Estill Bamberg April Thurnell Garbe  Exercises Supine Cervical Retraction with Towel - 1 x daily - 7 x weekly - 3 sets - 10 reps Seated Scapular Retraction - 1 x daily - 7 x weekly - 3 sets - 10 reps Seated Scalene Stretch with Towel - 1 x daily - 7 x weekly - 3 reps - 10 sec hold Seated Assisted Cervical Rotation with Towel - 1 x daily - 7 x weekly - 1 sets - 10 reps

## 2019-09-23 NOTE — Therapy (Signed)
[place], [place], [address] Phone: [phone]   Fax:  [phone]  Physical Therapy Evaluation  Patient Details  Name: Calvin Schroeder MRN: [id number] Date of Birth: 04-Aug-1960 Referring Provider (PT): Lorane Gell, NP   Encounter Date: 09/23/2019   PT End of Session - 09/23/19 0828    Visit Number 1    Number of Visits 12    Date for PT Re-Evaluation 11/04/19    Authorization Type MCD - [facility]    PT Start Time 0830    PT Stop Time 0915    PT Time Calculation (min) 45 min    Activity Tolerance Patient tolerated treatment well    Behavior During Therapy [facility] for tasks assessed/performed           Past Medical History:  Diagnosis Date  . Back pain   . COPD (chronic obstructive pulmonary disease) (HCC)    emphysema   . Dyspnea    with exertion   . HIV (human immunodeficiency virus infection) ([place])   . 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 Pneumonia    11/2016  . Tobacco abuse 06/10/2013    Past Surgical History:  Procedure Laterality Date  . APPENDECTOMY    . SHOULDER SURGERY    . VIDEO BRONCHOSCOPY WITH ENDOBRONCHIAL NAVIGATION N/A 02/07/2017   Procedure: VIDEO BRONCHOSCOPY WITH ENDOBRONCHIAL NAVIGATION;  Surgeon: [name], MD;  Location: [place];  Service: Thoracic;  Laterality: N/A;  . VIDEO BRONCHOSCOPY WITH ENDOBRONCHIAL ULTRASOUND N/A 02/07/2017   Procedure: VIDEO BRONCHOSCOPY WITH ENDOBRONCHIAL ULTRASOUND;  Surgeon: [name], MD;  Location[facility];  Service: Thoracic;  Laterality: N/A;    There were no vitals filed for this visit.    Subjective Assessment - 09/23/19 0832    Subjective Pt reports he thinks he has a disc pinching a nerve. Pt reports pins/needles down his left shoulder to whole hand. Pt notes it is constant. Going on for 3 months now. Pt states when his pressure washer wouldn't start he kept pulling on it over and over and it started about a few days  later. Pt reports he is to get xray soon. Pt works Engineer, [employer].    Pertinent History back problems    Limitations Lifting;House hold activities;Other (comment)   work   How long can you sit comfortably? Bending over it feels worse    How long can you stand comfortably? n/a    How long can you walk comfortably? n/a    Diagnostic tests x-ray to be done    Currently in Pain? Yes    Pain Score 10-Worst pain ever    Pain Location Neck    Pain Orientation Left    Pain Descriptors / Indicators Shooting;Stabbing;Pressure    Pain Type Acute pain    Pain Radiating Towards L shoulder & arm    Pain Onset 1 to 4 weeks ago    Pain Frequency Constant    Aggravating Factors  Bending forward    Pain Relieving Factors none    Effect of Pain on Daily Activities Sleeping, working              [facility] PT Assessment - 09/23/19 0001      Assessment   Medical Diagnosis M50.30 (ICD-10-CM) - Other cervical disc degeneration, unspecified cervical region    Referring Provider (PT) Lorane Gell, NP    Hand Dominance Right      Home Environment   Living  Environment Private residence      Prior Function   Level of Independence Independent      Observation/Other Assessments   Focus on Therapeutic Outcomes (FOTO)  n/a - MCD      Sensation   Additional Comments Numbness and tingling down L shoulder into whole L hand      Posture/Postural Control   Posture/Postural Control Postural limitations    Postural Limitations Decreased thoracic kyphosis;Rounded Shoulders    Posture Comments Maintains head slightly flexed ~20 deg      AROM   Left Shoulder Flexion 120 Degrees    Left Shoulder ABduction 40 Degrees    Left Shoulder Internal Rotation --   WFL; feels in back of arm/shoulder blade   Left Shoulder External Rotation 65 Degrees   tightness in front   Cervical Flexion 90%   with pain   Cervical Extension 10%    Cervical - Right Side Bend 60%    Cervical - Left Side Bend 50%    Cervical -  Right Rotation 100%    Cervical - Left Rotation 50%   Limited due to pain/stiffness     Strength   Right/Left Shoulder --   Grossly 5/5     Palpation   Spinal mobility Thoracic hypomobile, cervical hypomobile with lateral mobs to R,     Palpation comment TTP scalene, upper trap, increased symptoms in hand with palpation along L lateral mid/lower cervical paraspinals; relief with suboccipital release      Special Tests   Cervical Tests Spurling's      Spurling's   Findings Negative      Distraction Test   Findngs Positive    side Left                      Objective measurements completed on examination: See above findings.       [facility] Adult PT Treatment/Exercise - 09/23/19 0001      Neck Exercises: Supine   Neck Retraction 10 reps      Shoulder Exercises: Seated   Retraction Strengthening;10 reps;Both      Neck Exercises: Stretches   Upper Trapezius Stretch Right;Left;30 seconds    Other Neck Stretches Scalene stretch with towel 3x10 sec    Other Neck Stretches Cervical rotation MWM using towel x 5                  PT Education - 09/23/19 0954    Education Details exam findings, POC, HEP    Person(s) Educated Patient    Methods Explanation;Demonstration;Tactile cues;Verbal cues    Comprehension Verbalized understanding;Returned demonstration;Verbal cues required;Tactile cues required            PT Short Term Goals - 09/23/19 0933      PT SHORT TERM GOAL #1   Title Pt will be independent with initial HEP    Baseline Newly provided    Time 3    Period Weeks    Status New    Target Date 10/14/19      PT SHORT TERM GOAL #2   Title Pt will have improved cervical ROM to at least 50% or more of extension and L rotation    Time 3    Period Weeks    Status New    Target Date 10/14/19      PT SHORT TERM GOAL #3   Title Pt will be able to tolerate sleeping on one pillow at night with pain </=6/10  Baseline 10/10 pain, needs 2 pillows to  sleep    Time 3    Period Weeks    Status New    Target Date 10/14/19             PT Long Term Goals - 09/23/19 0936      PT LONG TERM GOAL #1   Title Pt will be independent with advanced HEP    Time 6    Period Weeks    Status New    Target Date 11/04/19      PT LONG TERM GOAL #2   Title Pt will demonstrate full neck ROM </= 2/10 to be able to look overhead for work and driving tasks    Time 6    Period Weeks    Status New    Target Date 11/04/19      PT LONG TERM GOAL #3   Title Pt will report return to normal activity with </=2/10 pain    Time 6    Period Weeks    Status New    Target Date 11/04/19      PT LONG TERM GOAL #4   Title Pt will demonstrate full pain free shoulder ROM for home and work tasks    Time 6    Period Weeks    Status New    Target Date 11/04/19                  Plan - 09/23/19 0920    Clinical Impression Statement Pt is a 59 y/o M presenting to OPPT with c/o of neck pain with radiating painand numbness/tingling into left shoulder all the way to his hand. Pt injured himself ~3 months ago while attempting to start a pressure washer. Pt demonstrates poor cervical ROM with hypomobile thoracic spine, scalene/anterior neck and upper trap tightness with increased radiation during R lateral glide of C5-C6. Pt responded well to neck retraction with localization of pain to scapula. Pt appears that he may benefit from [facility] approach. S/S consistent with nerve entrapment.    Personal Factors and Comorbidities Time since onset of injury/illness/exacerbation;Comorbidity 1;Profession;Age;Fitness    Comorbidities Chronic back pain    Examination-Activity Limitations Carry;Reach Overhead;Sleep;Hygiene/Grooming;Lift;Bend    Examination-Participation Restrictions Occupation;Community Activity;Driving;Yard Work    Designer, [employer]    Rehab Potential Good    PT Frequency 2x / week    PT Duration 6 weeks    PT Treatment/Interventions  ADLs/Self Care Home Management;Biofeedback;Cryotherapy;Electrical Stimulation;Iontophoresis 4mg /ml Dexamethasone;Moist Heat;Traction;Ultrasound;Functional mobility training;Therapeutic activities;Therapeutic exercise;Neuromuscular re-education;Patient/family education;Manual techniques;Passive range of motion;Dry needling;Taping;Spinal Manipulations;Joint Manipulations    PT Next Visit Plan Assess response to HEP. Consider traction machine. Continue to progress pt's neck into extension; consider side glides. Manual therapy for neck/shoulder/thoracic. Consider dry needling.    PT Home Exercise Plan Access Code: [email or web address]    Consulted and Agree with Plan of Care Patient           Patient will benefit from skilled therapeutic intervention in order to improve the following deficits and impairments:  Decreased range of motion, Increased fascial restricitons, Impaired tone, Increased muscle spasms, Impaired UE functional use, Pain, Hypomobility, Impaired flexibility, Decreased mobility, Decreased strength, Postural dysfunction, Impaired sensation  Visit Diagnosis: Cervicalgia  Acute pain of left shoulder  Stiffness of left shoulder, not elsewhere classified  Radiculopathy, cervicothoracic region     Problem List Patient Active Problem List   Diagnosis Date Noted  . 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 Medication management 09/02/2018  .  OSA (obstructive sleep apnea) 07/01/2018  . Healthcare maintenance 03/30/2018  . Sebaceous cyst 03/30/2018  . HTN (hypertension) 03/30/2018  . HIV (human immunodeficiency virus infection) ([place]) 09/22/2017  . 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 AIDS (acquired immune deficiency syndrome) ([place]) 04/09/2017  . 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 MAI (mycobacterium avium-intracellulare) ([place]) 03/17/2017  . Erythrocytosis 06/10/2013  . Lung mass 06/10/2013  . Tobacco abuse 06/10/2013    [facility] April Ma L Donis Kotowski  PT, DPT 09/23/2019, 9:56 AM  [facility] [address] [place], [place], [address] Phone: [phone]   Fax:  [phone]  Name: AB LEAMING MRN: [id number] Date of Birth: 17-Aug-1960

## 2019-09-25 LAB — COMPREHENSIVE METABOLIC PANEL
AG Ratio: 1.4 (calc) (ref 1.0–2.5)
ALT: 18 U/L (ref 9–46)
AST: 16 U/L (ref 10–35)
Albumin: 4.3 g/dL (ref 3.6–5.1)
Alkaline phosphatase (APISO): 85 U/L (ref 35–144)
BUN: 10 mg/dL (ref 7–25)
CO2: 30 mmol/L (ref 20–32)
Calcium: 9 mg/dL (ref 8.6–10.3)
Chloride: 102 mmol/L (ref 98–110)
Creat: 0.86 mg/dL (ref 0.70–1.33)
Globulin: 3.1 g/dL (calc) (ref 1.9–3.7)
Glucose, Bld: 95 mg/dL (ref 65–99)
Potassium: 4.8 mmol/L (ref 3.5–5.3)
Sodium: 139 mmol/L (ref 135–146)
Total Bilirubin: 0.5 mg/dL (ref 0.2–1.2)
Total Protein: 7.4 g/dL (ref 6.1–8.1)

## 2019-09-25 LAB — CBC
HCT: 47.8 % (ref 38.5–50.0)
Hemoglobin: 17.1 g/dL (ref 13.2–17.1)
MCH: 37.7 pg — ABNORMAL HIGH (ref 27.0–33.0)
MCHC: 35.8 g/dL (ref 32.0–36.0)
MCV: 105.3 fL — ABNORMAL HIGH (ref 80.0–100.0)
MPV: 9.3 fL (ref 7.5–12.5)
Platelets: 241 Thousand/uL (ref 140–400)
RBC: 4.54 Million/uL (ref 4.20–5.80)
RDW: 14.9 % (ref 11.0–15.0)
WBC: 4.9 Thousand/uL (ref 3.8–10.8)

## 2019-09-25 LAB — HIV-1 RNA QUANT-NO REFLEX-BLD
HIV 1 RNA Quant: <20 {copies}/mL
HIV-1 RNA Quant, Log: <1.3 {Log_copies}/mL

## 2019-09-25 LAB — LIPID PANEL
Cholesterol: 160 mg/dL (ref <200)
HDL: 76 mg/dL (ref >=40)
LDL Cholesterol (Calc): 70 mg/dL (calc)
Non-HDL Cholesterol (Calc): 84 mg/dL (calc) (ref <130)
Total CHOL/HDL Ratio: 2.1 (calc) (ref <5.0)
Triglycerides: 67 mg/dL (ref <150)

## 2019-09-25 LAB — SYPHILIS: RPR W/REFLEX TO RPR TITER AND TREPONEMAL ANTIBODIES, TRADITIONAL SCREENING AND DIAGNOSIS ALGORITHM: RPR Ser Ql: NONREACTIVE

## 2019-10-06 ENCOUNTER — Other Ambulatory Visit: Payer: Self-pay

## 2019-10-06 ENCOUNTER — Encounter: Payer: Self-pay | Admitting: Physical Therapy

## 2019-10-06 ENCOUNTER — Ambulatory Visit: Payer: Medicaid Other | Admitting: Physical Therapy

## 2019-10-06 DIAGNOSIS — M542 Cervicalgia: Secondary | ICD-10-CM

## 2019-10-06 DIAGNOSIS — M25512 Pain in left shoulder: Secondary | ICD-10-CM

## 2019-10-06 DIAGNOSIS — M25612 Stiffness of left shoulder, not elsewhere classified: Secondary | ICD-10-CM

## 2019-10-06 DIAGNOSIS — M5413 Radiculopathy, cervicothoracic region: Secondary | ICD-10-CM

## 2019-10-06 NOTE — Therapy (Signed)
[place], [place], [address] Phone: [phone]   Fax:  [phone]  Physical Therapy Treatment  Patient Details  Name: Calvin Schroeder MRN: [id number] Date of Birth: 06/23/1960 Referring Provider (PT): Lorane Gell, NP   Encounter Date: 10/06/2019   PT End of Session - 10/06/19 0719    Visit Number 2    Number of Visits 12    Date for PT Re-Evaluation 11/04/19    Authorization Type MCD - [facility]    Authorization Time Period No Auth required- 27 visit limit    PT Start Time 0715    PT Stop Time 0757    PT Time Calculation (min) 42 min           Past Medical History:  Diagnosis Date  . Back pain   . COPD (chronic obstructive pulmonary disease) (HCC)    emphysema   . Dyspnea    with exertion   . HIV (human immunodeficiency virus infection) ([place])   . 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 Pneumonia    11/2016  . Tobacco abuse 06/10/2013    Past Surgical History:  Procedure Laterality Date  . APPENDECTOMY    . SHOULDER SURGERY    . VIDEO BRONCHOSCOPY WITH ENDOBRONCHIAL NAVIGATION N/A 02/07/2017   Procedure: VIDEO BRONCHOSCOPY WITH ENDOBRONCHIAL NAVIGATION;  Surgeon: [name], MD;  Location: [place];  Service: Thoracic;  Laterality: N/A;  . VIDEO BRONCHOSCOPY WITH ENDOBRONCHIAL ULTRASOUND N/A 02/07/2017   Procedure: VIDEO BRONCHOSCOPY WITH ENDOBRONCHIAL ULTRASOUND;  Surgeon: [name], MD;  Location: [facility];  Service: Thoracic;  Laterality: N/A;    There were no vitals filed for this visit.   Subjective Assessment - 10/06/19 0717    Subjective Xrays showed 3 herniated discs and alot of arthritis at the bottom of my neck. MD said to try PT and I may need surgery if it does not help.    Currently in Pain? Yes    Pain Score 10-Worst pain ever    Pain Location Neck    Pain Orientation Left    Pain Descriptors / Indicators Spasm;Shooting;Stabbing    Pain Type Acute pain    Pain  Radiating Towards L shoulder and arm/scapula    Aggravating Factors  turning head left    Pain Relieving Factors none                             [facility] Adult PT Treatment/Exercise - 10/06/19 0001      Neck Exercises: Supine   Neck Retraction 10 reps    Other Supine Exercise shoulder AROM with dowel into flexion       Shoulder Exercises: Seated   Retraction Strengthening;10 reps;Both      Modalities   Modalities Traction      Traction   Type of Traction Cervical    Min (lbs) 10    Max (lbs) 20    Hold Time 60    Rest Time 20    Time 10      Manual Therapy   Manual Therapy Soft tissue mobilization    Soft tissue mobilization levator and periscap, upper trap- left side focused      Neck Exercises: Stretches   Other Neck Stretches Scalene stretch with towel 3x10 sec    Other Neck Stretches Cervical rotation MWM using towel x 5  PT Education - 10/06/19 0848    Education Details TPDN    Person(s) Educated Patient    Methods Explanation    Comprehension Verbalized understanding            PT Short Term Goals - 09/23/19 0933      PT SHORT TERM GOAL #1   Title Pt will be independent with initial HEP    Baseline Newly provided    Time 3    Period Weeks    Status New    Target Date 10/14/19      PT SHORT TERM GOAL #2   Title Pt will have improved cervical ROM to at least 50% or more of extension and L rotation    Time 3    Period Weeks    Status New    Target Date 10/14/19      PT SHORT TERM GOAL #3   Title Pt will be able to tolerate sleeping on one pillow at night with pain </=6/10    Baseline 10/10 pain, needs 2 pillows to sleep    Time 3    Period Weeks    Status New    Target Date 10/14/19             PT Long Term Goals - 09/23/19 0936      PT LONG TERM GOAL #1   Title Pt will be independent with advanced HEP    Time 6    Period Weeks    Status New    Target Date 11/04/19      PT LONG TERM GOAL #2    Title Pt will demonstrate full neck ROM </= 2/10 to be able to look overhead for work and driving tasks    Time 6    Period Weeks    Status New    Target Date 11/04/19      PT LONG TERM GOAL #3   Title Pt will report return to normal activity with </=2/10 pain    Time 6    Period Weeks    Status New    Target Date 11/04/19      PT LONG TERM GOAL #4   Title Pt will demonstrate full pain free shoulder ROM for home and work tasks    Time 6    Period Weeks    Status New    Target Date 11/04/19                 Plan - 10/06/19 0850    Clinical Impression Statement Pt reports min compliance with HEP. He arrives with head in cervical flexion. Increased pain with all therex. STW decreased pain and he was able to turn his head to the left with more ROM afterward. Trial of cervical traction and he reported he felt better after session. Will assess benefot next session. Discussed benefit of TPDN as he had most relief with STW.    PT Next Visit Plan Assess response to HEP. assess response to traction machine. Continue to progress pt's neck into extension; consider side glides. Manual therapy for neck/shoulder/thoracic. Consider dry needling , continue STW.    PT Home Exercise Plan Access Code: [email or web address]           Patient will benefit from skilled therapeutic intervention in order to improve the following deficits and impairments:  Decreased range of motion, Increased fascial restricitons, Impaired tone, Increased muscle spasms, Impaired UE functional use, Pain, Hypomobility, Impaired flexibility, Decreased mobility, Decreased strength, Postural dysfunction, Impaired sensation  Visit Diagnosis: Cervicalgia  Acute pain of left shoulder  Stiffness of left shoulder, not elsewhere classified  Radiculopathy, cervicothoracic region     Problem List Patient Active Problem List   Diagnosis Date Noted  . 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 Medication management  09/02/2018  . OSA (obstructive sleep apnea) 07/01/2018  . Healthcare maintenance 03/30/2018  . Sebaceous cyst 03/30/2018  . HTN (hypertension) 03/30/2018  . HIV (human immunodeficiency virus infection) ([place]) 09/22/2017  . 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 AIDS (acquired immune deficiency syndrome) ([place]) 04/09/2017  . 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 MAI (mycobacterium avium-intracellulare) ([place]) 03/17/2017  . Erythrocytosis 06/10/2013  . Lung mass 06/10/2013  . Tobacco abuse 06/10/2013    Dorene Ar, PTA 10/06/2019, 8:57 AM  [facility] [address] [place], [place], [address] Phone: [phone]   Fax:  [phone]  Name: Calvin Schroeder MRN: [id number] Date of Birth: 05/27/1960

## 2019-10-06 NOTE — Patient Instructions (Signed)

## 2019-10-08 ENCOUNTER — Encounter: Payer: Self-pay | Admitting: Physical Therapy

## 2019-10-08 ENCOUNTER — Encounter: Payer: Medicaid Other | Admitting: Infectious Diseases

## 2019-10-08 ENCOUNTER — Ambulatory Visit: Payer: Medicaid Other | Attending: Nurse Practitioner | Admitting: Physical Therapy

## 2019-10-08 ENCOUNTER — Other Ambulatory Visit: Payer: Self-pay

## 2019-10-08 DIAGNOSIS — M25512 Pain in left shoulder: Secondary | ICD-10-CM | POA: Insufficient documentation

## 2019-10-08 DIAGNOSIS — M5413 Radiculopathy, cervicothoracic region: Secondary | ICD-10-CM | POA: Insufficient documentation

## 2019-10-08 DIAGNOSIS — M25612 Stiffness of left shoulder, not elsewhere classified: Secondary | ICD-10-CM

## 2019-10-08 DIAGNOSIS — M542 Cervicalgia: Secondary | ICD-10-CM | POA: Diagnosis present

## 2019-10-08 NOTE — Therapy (Addendum)
[place], [place], [address] Phone: [phone]   Fax:  [phone]  Physical Therapy Treatment  Patient Details  Name: Calvin Schroeder MRN: [id number] Date of Birth: [birth year] Referring Provider (PT): Lorane Gell, NP   Encounter Date: 10/08/2019   PT End of Session - 10/08/19 0825    Visit Number 3    Number of Visits 12    Date for PT Re-Evaluation 11/04/19    Authorization Type MCD - [facility]    Authorization Time Period No Auth required- 27 visit limit    PT Start Time 0800    PT Stop Time 0850    PT Time Calculation (min) 50 min           Past Medical History:  Diagnosis Date  . Back pain   . COPD (chronic obstructive pulmonary disease) (HCC)    emphysema   . Dyspnea    with exertion   . HIV (human immunodeficiency virus infection) ([place])   . 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 Pneumonia    11/2016  . Tobacco abuse 06/10/2013    Past Surgical History:  Procedure Laterality Date  . APPENDECTOMY    . SHOULDER SURGERY    . VIDEO BRONCHOSCOPY WITH ENDOBRONCHIAL NAVIGATION N/A 02/07/2017   Procedure: VIDEO BRONCHOSCOPY WITH ENDOBRONCHIAL NAVIGATION;  Surgeon: [name], MD;  Location: [facility];  Service: Thoracic;  Laterality: N/A;  . VIDEO BRONCHOSCOPY WITH ENDOBRONCHIAL ULTRASOUND N/A 02/07/2017   Procedure: VIDEO BRONCHOSCOPY WITH ENDOBRONCHIAL ULTRASOUND;  Surgeon: [name], MD;  [facility];  Service: Thoracic;  Laterality: N/A;    There were no vitals filed for this visit.   Subjective Assessment - 10/08/19 0802    Subjective The next morning I felt a little better after last session. THe pain was not as severe and had moved on top of my shoulder blade.    Currently in Pain? Yes    Pain Score 9     Pain Location Neck                             [facility] Adult PT Treatment/Exercise - 10/08/19 0001      Neck Exercises: Seated   Neck Retraction  10 reps      Neck Exercises: Prone   Other Prone Exercise scap retract x 10       Modalities   Modalities Moist Heat      Moist Heat Therapy   Number Minutes Moist Heat 10 Minutes    Moist Heat Location Cervical      Manual Therapy   Manual Therapy Soft tissue mobilization    Soft tissue mobilization levator and periscap, upper trap- left side focused      Neck Exercises: Stretches   Levator Stretch 3 reps    Other Neck Stretches Cervical rotation MWM using towel x 5            Trigger Point Dry Needling - 10/08/19 0001    Consent Given? Yes   Performed by Carolyne Littles, PT   Education Handout Provided Yes    Muscles Treated Head and Neck Upper trapezius;Levator scapulae    Upper Trapezius Response Twitch reponse elicited;Palpable increased muscle length    Levator Scapulae Response Twitch response elicited;Palpable increased muscle length                  PT Short Term Goals -  09/23/19 0933      PT SHORT TERM GOAL #1   Title Pt will be independent with initial HEP    Baseline Newly provided    Time 3    Period Weeks    Status New    Target Date 10/14/19      PT SHORT TERM GOAL #2   Title Pt will have improved cervical ROM to at least 50% or more of extension and L rotation    Time 3    Period Weeks    Status New    Target Date 10/14/19      PT SHORT TERM GOAL #3   Title Pt will be able to tolerate sleeping on one pillow at night with pain </=6/10    Baseline 10/10 pain, needs 2 pillows to sleep    Time 3    Period Weeks    Status New    Target Date 10/14/19             PT Long Term Goals - 09/23/19 0936      PT LONG TERM GOAL #1   Title Pt will be independent with advanced HEP    Time 6    Period Weeks    Status New    Target Date 11/04/19      PT LONG TERM GOAL #2   Title Pt will demonstrate full neck ROM </= 2/10 to be able to look overhead for work and driving tasks    Time 6    Period Weeks    Status New    Target Date 11/04/19       PT LONG TERM GOAL #3   Title Pt will report return to normal activity with </=2/10 pain    Time 6    Period Weeks    Status New    Target Date 11/04/19      PT LONG TERM GOAL #4   Title Pt will demonstrate full pain free shoulder ROM for home and work tasks    Time 6    Period Weeks    Status New    Target Date 11/04/19                 Plan - 10/08/19 0844    Clinical Impression Statement Trial of TPDN with myofascial release today. Treated upper trap and levator on left side. Pt also had 30 minute massage prior to appointment but did not think it was helpful. Reviewed HEP. HMP at end of session but pt became uncomfortable in hooklying and hed increased pain in neck and arm.  Will assess response next session.    PT Next Visit Plan Assess response to HEP and TPDN, . assess response to traction machine. Continue to progress pt's neck into extension; consider side glides. Manual therapy for neck/shoulder/thoracic. Consider dry needling , continue STW.    PT Home Exercise Plan Access Code: [email or web address]    Consulted and Agree with Plan of Care Other (Comment)           Patient will benefit from skilled therapeutic intervention in order to improve the following deficits and impairments:  Decreased range of motion, Increased fascial restricitons, Impaired tone, Increased muscle spasms, Impaired UE functional use, Pain, Hypomobility, Impaired flexibility, Decreased mobility, Decreased strength, Postural dysfunction, Impaired sensation  Visit Diagnosis: Cervicalgia  Acute pain of left shoulder  Stiffness of left shoulder, not elsewhere classified  Radiculopathy, cervicothoracic region     Problem List Patient Active Problem List   Diagnosis  Date Noted  . 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 Medication management 09/02/2018  . OSA (obstructive sleep apnea) 07/01/2018  . Healthcare maintenance 03/30/2018  . Sebaceous cyst 03/30/2018  . HTN  (hypertension) 03/30/2018  . HIV (human immunodeficiency virus infection) ([place]) 09/22/2017  . 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 AIDS (acquired immune deficiency syndrome) ([place]) 04/09/2017  . 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 MAI (mycobacterium avium-intracellulare) ([place]) 03/17/2017  . Erythrocytosis 06/10/2013  . Lung mass 06/10/2013  . Tobacco abuse 06/10/2013    Dorene Ar, PTA 10/08/2019, 9:51 AM  [facility] [address] [place], [place], [address] Phone: [phone]   Fax:  [phone]  Name: KRYSTAL DELDUCA MRN: [id number] Date of Birth: [birth year]

## 2019-10-12 ENCOUNTER — Ambulatory Visit: Payer: Medicaid Other

## 2019-10-12 ENCOUNTER — Other Ambulatory Visit: Payer: Self-pay

## 2019-10-12 DIAGNOSIS — M25612 Stiffness of left shoulder, not elsewhere classified: Secondary | ICD-10-CM

## 2019-10-12 DIAGNOSIS — M5413 Radiculopathy, cervicothoracic region: Secondary | ICD-10-CM

## 2019-10-12 DIAGNOSIS — M542 Cervicalgia: Secondary | ICD-10-CM

## 2019-10-12 DIAGNOSIS — M25512 Pain in left shoulder: Secondary | ICD-10-CM

## 2019-10-12 NOTE — Therapy (Signed)
Calvin Schro[place], [place], [address] Phone: [phone]   Fax:  [phone]  Physical Therapy Treatment  Patient Details  Name: Calvin Schroeder MRN: [id number] Date of Birth: March 28, 1960 Referring Provider (PT): Lorane Gell, NP   Encounter Date: 10/12/2019   PT End of Session - 10/12/19 0806    Visit Number 4    Number of Visits 12    Date for PT Re-Evaluation 11/04/19    Authorization Type MCD - [facility]    Authorization Time Period No Auth required- 27 visit limit    PT Start Time 0802    PT Stop Time 0845    PT Time Calculation (min) 43 min    Activity Tolerance Patient tolerated treatment well    Behavior During Therapy [facility] for tasks assessed/performed           Past Medical History:  Diagnosis Date  . Back pain   . COPD (chronic obstructive pulmonary disease) (HCC)    emphysema   . Dyspnea    with exertion   . HIV (human immunodeficiency virus infection) ([place])   . 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 Pneumonia    11/2016  . Tobacco abuse 06/10/2013    Past Surgical History:  Procedure Laterality Date  . APPENDECTOMY    . SHOULDER SURGERY    . VIDEO BRONCHOSCOPY WITH ENDOBRONCHIAL NAVIGATION N/A 02/07/2017   Procedure: VIDEO BRONCHOSCOPY WITH ENDOBRONCHIAL NAVIGATION;  Surgeon: [name], MD;  Location: [facility];  Service: Thoracic;  Laterality: N/A;  . VIDEO BRONCHOSCOPY WITH ENDOBRONCHIAL ULTRASOUND N/A 02/07/2017   Procedure: VIDEO BRONCHOSCOPY WITH ENDOBRONCHIAL ULTRASOUND;  Surgeon: [name], MD;  [facility];  Service: Thoracic;  Laterality: N/A;    There were no vitals filed for this visit.   Subjective Assessment - 10/12/19 0804    Subjective Pt reports his neck feels the same. DN might have helped for awhile that day for a few hours but then way back the next morning.    Currently in Pain? Yes    Pain Score 9     Pain Location Neck    Pain Orientation Left     Pain Descriptors / Indicators Pins and needles    Pain Type Acute pain                             [facility] Adult PT Treatment/Exercise - 10/12/19 0001      Exercises   Exercises Neck      Neck Exercises: Seated   Neck Retraction 10 reps    Other Seated Exercise T/S Ext over chair [place] with pillow x10      Neck Exercises: Sidelying   Other Sidelying Exercise L thoracic rotation x 10 HBH      Manual Therapy   Manual Therapy Joint mobilization;Soft tissue mobilization;Passive ROM;Manual Traction    Manual therapy comments Prone/Supine    Joint Mobilization L sideglides mid-lower cervical grade 3-4 + L SB and L rot'n, lower cervical ext mobs grade 3, T/S PAs and UPAs grade 3-4, rib rocking    Soft tissue mobilization STM/XFM to L medial scap mm, UT/LS/SO    Passive ROM  B rot'n, mild extension, SB L    Manual Traction cervical traction                    PT Short Term Goals - 09/23/19 4656  PT SHORT TERM GOAL #1   Title Pt will be independent with initial HEP    Baseline Newly provided    Time 3    Period Weeks    Status New    Target Date 10/14/19      PT SHORT TERM GOAL #2   Title Pt will have improved cervical ROM to at least 50% or more of extension and L rotation    Time 3    Period Weeks    Status New    Target Date 10/14/19      PT SHORT TERM GOAL #3   Title Pt will be able to tolerate sleeping on one pillow at night with pain </=6/10    Baseline 10/10 pain, needs 2 pillows to sleep    Time 3    Period Weeks    Status New    Target Date 10/14/19             PT Long Term Goals - 09/23/19 0936      PT LONG TERM GOAL #1   Title Pt will be independent with advanced HEP    Time 6    Period Weeks    Status New    Target Date 11/04/19      PT LONG TERM GOAL #2   Title Pt will demonstrate full neck ROM </= 2/10 to be able to look overhead for work and driving tasks    Time 6    Period Weeks    Status New    Target Date  11/04/19      PT LONG TERM GOAL #3   Title Pt will report return to normal activity with </=2/10 pain    Time 6    Period Weeks    Status New    Target Date 11/04/19      PT LONG TERM GOAL #4   Title Pt will demonstrate full pain free shoulder ROM for home and work tasks    Time 6    Period Weeks    Status New    Target Date 11/04/19                 Plan - 10/12/19 0807    Clinical Impression Statement Focused session on manual therapy today to address scapular pain, thoracic and ribcage hypomobility, and L UE radicuar pain peripheralizing from neck. Pt educated heavily on cervical posturing, encouraging pt to align back/neck against wall throughout the day at work to reduce pressure/pull on necks and UT/LS. Pt has referral of pain to L shoulder blade with extension and L rot'n, but none with L rot'n + traction and L side glides.    PT Next Visit Plan Consider TPDN and traction machine. Continue to progress pt's neck into extension, side glides, thoracic mobility; Manual therapy for neck/shoulder/thoracic, STW.    PT Home Exercise Plan Access Code: [email or web address]    Consulted and Agree with Plan of Care Patient           Patient will benefit from skilled therapeutic intervention in order to improve the following deficits and impairments:  Decreased range of motion, Increased fascial restricitons, Impaired tone, Increased muscle spasms, Impaired UE functional use, Pain, Hypomobility, Impaired flexibility, Decreased mobility, Decreased strength, Postural dysfunction, Impaired sensation  Visit Diagnosis: Cervicalgia  Acute pain of left shoulder  Stiffness of left shoulder, not elsewhere classified  Radiculopathy, cervicothoracic region     Problem List Patient Active Problem List   Diagnosis Date Noted  . Infection  due to Mycobacterium abscessus 08/12/2019  . Medication management 09/02/2018  . OSA (obstructive sleep apnea) 07/01/2018  . Healthcare maintenance 03/30/2018   . Sebaceous cyst 03/30/2018  . HTN (hypertension) 03/30/2018  . HIV (human immunodeficiency virus infection) ([place]) 09/22/2017  . 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 AIDS (acquired immune deficiency syndrome) ([place]) 04/09/2017  . 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 MAI (mycobacterium avium-intracellulare) ([place]) 03/17/2017  . Erythrocytosis 06/10/2013  . Lung mass 06/10/2013  . Tobacco abuse 06/10/2013    Izell , PT, DPT 10/12/2019, 10:37 AM  [facility] [address] [place], [place], [address] Phone: [phone]   Fax:  [phone]  Name: STEDMAN SUMMERVILLE MRN: [id number] Date of Birth: 10/11/1960

## 2019-10-19 ENCOUNTER — Ambulatory Visit: Payer: Medicaid Other | Admitting: Physical Therapy

## 2019-10-22 ENCOUNTER — Other Ambulatory Visit: Payer: Self-pay

## 2019-10-22 ENCOUNTER — Encounter: Payer: Medicaid Other | Admitting: Infectious Diseases

## 2019-10-22 ENCOUNTER — Encounter: Payer: Self-pay | Admitting: Physical Therapy

## 2019-10-22 ENCOUNTER — Ambulatory Visit: Payer: Medicaid Other | Admitting: Physical Therapy

## 2019-10-22 DIAGNOSIS — M25612 Stiffness of left shoulder, not elsewhere classified: Secondary | ICD-10-CM

## 2019-10-22 DIAGNOSIS — M542 Cervicalgia: Secondary | ICD-10-CM | POA: Diagnosis not present

## 2019-10-22 DIAGNOSIS — M5413 Radiculopathy, cervicothoracic region: Secondary | ICD-10-CM

## 2019-10-22 DIAGNOSIS — M25512 Pain in left shoulder: Secondary | ICD-10-CM

## 2019-10-22 NOTE — Therapy (Addendum)
[place], [place], [address] Phone: [phone]   Fax:  [phone]  Physical Therapy Treatment and Discharge  Patient Details  Name: Calvin Schroeder MRN: [id number] Date of Birth: [birth year] Referring Provider (PT): Lorane Gell, NP   PHYSICAL THERAPY DISCHARGE SUMMARY  Visits from Start of Care: 5  Current functional level related to goals / functional outcomes: Reports no change   Remaining deficits: Reports no change   Education / Equipment: See below  Plan: Patient agrees to discharge.  Patient goals were not met. Patient is being discharged due to lack of progress.  ?????        Encounter Date: 10/22/2019   PT End of Session - 10/22/19 0805    Visit Number 5    Number of Visits 12    Date for PT Re-Evaluation 11/04/19    Authorization Type MCD - [employer]    Authorization Time Period No Auth required- 27 visit limit    PT Start Time 0800    PT Stop Time 0845    PT Time Calculation (min) 45 min           Past Medical History:  Diagnosis Date  . Back pain   . COPD (chronic obstructive pulmonary disease) (HCC)    emphysema   . Dyspnea    with exertion   . HIV (human immunodeficiency virus infection) ([place])   . 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 Pneumonia    11/2016  . Tobacco abuse 06/10/2013    Past Surgical History:  Procedure Laterality Date  . APPENDECTOMY    . SHOULDER SURGERY    . VIDEO BRONCHOSCOPY WITH ENDOBRONCHIAL NAVIGATION N/A 02/07/2017   Procedure: VIDEO BRONCHOSCOPY WITH ENDOBRONCHIAL NAVIGATION;  Surgeon: [name], MD;  Location: [facility];  Service: Thoracic;  Laterality: N/A;  . VIDEO BRONCHOSCOPY WITH ENDOBRONCHIAL ULTRASOUND N/A 02/07/2017   Procedure: VIDEO BRONCHOSCOPY WITH ENDOBRONCHIAL ULTRASOUND;  Surgeon: [name], MD;  [facility];  Service: Thoracic;  Laterality: N/A;    There were no vitals filed for this visit.    Subjective Assessment - 10/22/19 0927    Subjective No change overall, I am miserable. I think I need to go back to the doctor. My tricep feels weak. I get a little relief with putting hand behind my head. I have minimal improvement after PT sessions    Currently in Pain? Yes    Pain Score 9     Pain Location Neck    Pain Orientation Left    Pain Descriptors / Indicators Pins and needles    Pain Type Acute pain    Pain Radiating Towards L shoulder/ arm/ scapula    Aggravating Factors  turning head left or neck extension    Pain Relieving Factors placing left hand behind head                             [facility] Adult PT Treatment/Exercise - 10/22/19 0001      Neck Exercises: Supine   Neck Retraction 10 reps    Neck Retraction Limitations over large towel roll working into extension      Traction   Type of Traction Cervical    Min (lbs) 10    Max (lbs) 20    Hold Time 60    Rest Time 20    Time 10      Manual Therapy  Manual Therapy Soft tissue mobilization    Soft tissue mobilization levator and periscap, upper trap- left side focused                    PT Short Term Goals - 10/22/19 0929      PT SHORT TERM GOAL #1   Title Pt will be independent with initial HEP    Baseline compliant as able due to pain    Time 3    Period Weeks    Status On-going      PT SHORT TERM GOAL #2   Title Pt will have improved cervical ROM to at least 50% or more of extension and L rotation    Baseline no change    Time 3    Period Weeks    Status On-going      PT SHORT TERM GOAL #3   Title Pt will be able to tolerate sleeping on one pillow at night with pain </=6/10    Baseline 10/10 pain, needs 2 pillows to sleep    Time 3    Period Weeks    Status On-going             PT Long Term Goals - 09/23/19 0936      PT LONG TERM GOAL #1   Title Pt will be independent with advanced HEP    Time 6    Period Weeks    Status New    Target Date 11/04/19       PT LONG TERM GOAL #2   Title Pt will demonstrate full neck ROM </= 2/10 to be able to look overhead for work and driving tasks    Time 6    Period Weeks    Status New    Target Date 11/04/19      PT LONG TERM GOAL #3   Title Pt will report return to normal activity with </=2/10 pain    Time 6    Period Weeks    Status New    Target Date 11/04/19      PT LONG TERM GOAL #4   Title Pt will demonstrate full pain free shoulder ROM for home and work tasks    Time 6    Period Weeks    Status New    Target Date 11/04/19                 Plan - 10/22/19 0832    Clinical Impression Statement Pt arrives holdin ghis head in cervical flexion around 45 degrees and slight rotation/side bend. He is unable to extend more than 10 degrees in an upright position. Pt reports overall no improvement. He reports that he felt the best after the treatment when he had soft tissue work and cervical traction however it was short term relief. Worked on neck extension over towel roll in supine and he was able to improve extension ROM. Performed STW to left upper trap, cervical and left periscap musclature. Repeated cervical traction. After ward he deomstrated improved cervical extension however still lacking 15 degrees from neutral. Pt will HOLD PT for now to F/U with MD regarding next steps in treatment due to limited progress with PT at this time.    PT Next Visit Plan HOLD PT for now due to lack of progress. . Patient will F/U with MD and request MRI, potential surgery required.    PT Home Exercise Plan Access Code: [email or web address]  Patient will benefit from skilled therapeutic intervention in order to improve the following deficits and impairments:  Decreased range of motion, Increased fascial restricitons, Impaired tone, Increased muscle spasms, Impaired UE functional use, Pain, Hypomobility, Impaired flexibility, Decreased mobility, Decreased strength, Postural dysfunction, Impaired  sensation  Visit Diagnosis: Cervicalgia  Acute pain of left shoulder  Stiffness of left shoulder, not elsewhere classified  Radiculopathy, cervicothoracic region     Problem List Patient Active Problem List   Diagnosis Date Noted  . 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 Medication management 09/02/2018  . OSA (obstructive sleep apnea) 07/01/2018  . Healthcare maintenance 03/30/2018  . Sebaceous cyst 03/30/2018  . HTN (hypertension) 03/30/2018  . HIV (human immunodeficiency virus infection) ([place]) 09/22/2017  . 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 AIDS (acquired immune deficiency syndrome) ([place]) 04/09/2017  . 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 MAI (mycobacterium avium-intracellulare) ([place]) 03/17/2017  . Erythrocytosis 06/10/2013  . Lung mass 06/10/2013  . Tobacco abuse 06/10/2013    Dorene Ar, PTA 10/22/2019, 9:30 AM   Estill Bamberg April Gordy Levan, PT, DPT 01/06/2020, 1:50 PM   [facility] [address] [place], [place], [address] Phone: [phone]   Fax:  [phone]  Name: Calvin Schroeder MRN: [id number] Date of Birth: 1960-11-11

## 2019-10-26 ENCOUNTER — Ambulatory Visit (INDEPENDENT_AMBULATORY_CARE_PROVIDER_SITE_OTHER): Payer: Medicaid Other | Admitting: Infectious Diseases

## 2019-10-26 ENCOUNTER — Encounter: Payer: Self-pay | Admitting: Infectious Diseases

## 2019-10-26 ENCOUNTER — Other Ambulatory Visit (INDEPENDENT_AMBULATORY_CARE_PROVIDER_SITE_OTHER): Payer: Medicaid Other

## 2019-10-26 ENCOUNTER — Other Ambulatory Visit: Payer: Self-pay

## 2019-10-26 VITALS — BP 139/89 | HR 82 | Wt 146.0 lb

## 2019-10-26 DIAGNOSIS — Z720 Tobacco use: Secondary | ICD-10-CM

## 2019-10-26 DIAGNOSIS — C469 Kaposi's sarcoma, unspecified: Secondary | ICD-10-CM

## 2019-10-26 DIAGNOSIS — A319 Mycobacterial infection, unspecified: Secondary | ICD-10-CM

## 2019-10-26 DIAGNOSIS — Z23 Encounter for immunization: Secondary | ICD-10-CM

## 2019-10-26 DIAGNOSIS — A318 Other mycobacterial infections: Secondary | ICD-10-CM

## 2019-10-26 DIAGNOSIS — Z79899 Other long term (current) drug therapy: Secondary | ICD-10-CM

## 2019-10-26 DIAGNOSIS — B20 Human immunodeficiency virus [HIV] disease: Secondary | ICD-10-CM | POA: Diagnosis not present

## 2019-10-26 DIAGNOSIS — J439 Emphysema, unspecified: Secondary | ICD-10-CM

## 2019-10-26 DIAGNOSIS — A310 Pulmonary mycobacterial infection: Secondary | ICD-10-CM | POA: Diagnosis not present

## 2019-10-26 DIAGNOSIS — Z113 Encounter for screening for infections with a predominantly sexual mode of transmission: Secondary | ICD-10-CM

## 2019-10-26 NOTE — Assessment & Plan Note (Signed)
Encouraged pt to take his arykace, omadacycline, and zyvox.  Will see him back in 3-4 months.

## 2019-10-26 NOTE — Progress Notes (Signed)
Subjective:    Patient ID: Calvin Schroeder, male    DOB: 1960-01-10, 59 y.o.   MRN: [id number]  HPI 59yo M previously found to have cystic lesion on chest CT on adm for PNA. 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 He was seen in ID3-2019 was found to have AIDS.  He was started on E/A/R however developed n/v (that persisted despite zofran) RestartedMAI rx 11-05-17.He states he took this starting in Dec.  He was started on tivicay-descovey. He has had f/u with pulm for COPD. He has also f/u with CVTS for a cyctic mass in LUL. His last CVTS f/u was 06-2019- He had repeat CT at that time that showed: 1. The large thick-walled, cavitary left upper lobe lesion has not significantly changed in size from the most recent CT and is most consistent with postinflammatory scarring given relative stability for several years. 2. There is increased multifocal nodularity at the left apex superior to this cavitary lesion, which appears inflammatory. Continued follow-up in 6-12 months suggested. 3. Additional bilateral pulmonary nodules are stable. No adenopathy or pleural effusion. 4. Aortic Atherosclerosis (ICD10-I70.0) and Emphysema (ICD10-J43.9).  He had f/u appt with Pulm 07-09-19 and had repeat Cx showing M abscessus.    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  Doxycycline 16.0 ug/mL Resistant   Imipenem Comment   Comment: 8.0 ug/mL Intermediate  Linezolid Comment   Comment: 1.0 ug/mL or less, Susceptible  Minocycline >8.0 ug/mL Resistant   Moxifloxacin Comment   Comment: 2.0 ug/mL Intermediate  Tigecycline 0.25 ug/mL   Tobramycin CANCELED   Comment: Test not performed   Result canceled by the ancillary.   Trimethoprim/Sulfa Comment   Comment: >8/152  ug/mL Resistant   He has also been seen by Onc for KS. He was seen in ID and by pharm in August- his MAC therapy was changed to  clofazamine (which he has not been able to tolerate due to n/v), linezolid and omdacycline. He has not started the omdacycline because he was at his daughter's wedding and he didn't want to feel poorly.  September 2021, he was given arikace, he states he will start this tomorow. He quit taking the clofazamine (diarrhea). He also took zyvox which made him nauseated. He did not start omadacycline, took scared.   He denies any issues with his tivicay-descovy.   Today he complains of neck pain. He is awaiting MRI/insurance approval. Possible surgery.    HIV 1 RNA Quant  Date Value  09/22/2019 <20 Copies/mL  12/10/2018 63 copies/mL (H)  03/16/2018 <20 DETECTED copies/mL (A)   CD4 T Cell Abs (/uL)  Date Value  09/22/2019 292 (L)  12/10/2018 256 (L)  03/16/2018 230 (L)      Review of Systems  Constitutional: Negative for appetite change and unexpected weight change.  Respiratory: Positive for cough and shortness of breath.   Gastrointestinal: Positive for nausea. Negative for constipation and diarrhea.  Genitourinary: Negative for difficulty urinating.  Musculoskeletal: Positive for arthralgias, myalgias and neck pain.       Objective:   Physical Exam Vitals reviewed.  Constitutional:      Appearance: Normal appearance.  HENT:     Mouth/Throat:     Mouth: Mucous membranes are moist.     Pharynx: No oropharyngeal exudate.  Eyes:  Extraocular Movements: Extraocular movements intact.     Pupils: Pupils are equal, round, and reactive to light.  Cardiovascular:     Rate and Rhythm: Normal rate and regular rhythm.  Pulmonary:     Effort: Pulmonary effort is normal.     Breath sounds: Normal breath sounds.  Abdominal:     General: Bowel sounds are normal. There is no distension.     Palpations: Abdomen is soft.     Tenderness: There is no  abdominal tenderness.  Musculoskeletal:        General: Normal range of motion.     Cervical back: Normal range of motion and neck supple.     Right lower leg: No edema.     Left lower leg: No edema.  Neurological:     General: No focal deficit present.     Mental Status: He is alert.  Psychiatric:        Mood and Affect: Mood normal.           Assessment & Plan:

## 2019-10-26 NOTE — Assessment & Plan Note (Signed)
Has been improving. He has lesion on RUE and L hip. On exam RUE area appears as scar.  Will continue to watch.

## 2019-10-26 NOTE — Assessment & Plan Note (Addendum)
Will continue on his 2 pill therapy.  Offered/refused condoms. Has 23 yr partner.  Will refer for him shingles vax Will need PCV 23 at next visit.  Has had flu vax at pulm rtc in 3- 91months.

## 2019-10-26 NOTE — Assessment & Plan Note (Signed)
My appreciation to pulm.

## 2019-10-26 NOTE — Assessment & Plan Note (Signed)
Encouraged to quit.  Let him know that this is the biggest influence on his life expectancy.

## 2019-10-26 NOTE — Assessment & Plan Note (Signed)
Hopefully we can can his 2 mycobacteria treated with his 3 drug regimen.  Arikace and zyvox will work for his MAI.

## 2019-10-26 NOTE — Assessment & Plan Note (Signed)
Will continue him on 2 drugs

## 2019-11-19 ENCOUNTER — Other Ambulatory Visit: Payer: Self-pay | Admitting: *Deleted

## 2019-11-19 DIAGNOSIS — R918 Other nonspecific abnormal finding of lung field: Secondary | ICD-10-CM

## 2019-12-07 ENCOUNTER — Other Ambulatory Visit: Payer: Self-pay

## 2019-12-07 ENCOUNTER — Ambulatory Visit (HOSPITAL_COMMUNITY)
Admission: RE | Admit: 2019-12-07 | Discharge: 2019-12-07 | Disposition: A | Discharge status: Home / Self Care | Payer: Medicaid Other | Source: Ambulatory Visit | Attending: Pulmonary Disease | Admitting: Pulmonary Disease

## 2019-12-07 ENCOUNTER — Other Ambulatory Visit: Payer: Self-pay | Admitting: Infectious Diseases

## 2019-12-07 DIAGNOSIS — B20 Human immunodeficiency virus [HIV] disease: Secondary | ICD-10-CM

## 2019-12-07 DIAGNOSIS — A319 Mycobacterial infection, unspecified: Secondary | ICD-10-CM | POA: Insufficient documentation

## 2019-12-07 DIAGNOSIS — A318 Other mycobacterial infections: Secondary | ICD-10-CM

## 2019-12-07 MED ORDER — TIVICAY 50 MG PO TABS: ORAL_TABLET | ORAL | 0 refills | Status: DC

## 2019-12-14 ENCOUNTER — Telehealth: Payer: Self-pay | Admitting: Critical Care Medicine

## 2019-12-14 NOTE — Telephone Encounter (Signed)
ATC x1, LVM to return call regarding choosing Dr. Erin Fulling.  Previously seen by Dr. Carlis Abbott.  Just need to clarify if we need to schedule an initial visit with Dr. Erin Fulling to establish care with new physician.  Will await return call.

## 2019-12-20 NOTE — Telephone Encounter (Signed)
Pt has an appt with Dr. Erin Fulling on 1.4.22 for a 30 min visit to establish care. Will close encounter.

## 2019-12-28 ENCOUNTER — Other Ambulatory Visit: Payer: Medicaid Other

## 2020-01-02 ENCOUNTER — Other Ambulatory Visit: Payer: Self-pay | Admitting: Infectious Diseases

## 2020-01-02 DIAGNOSIS — B20 Human immunodeficiency virus [HIV] disease: Secondary | ICD-10-CM

## 2020-01-03 ENCOUNTER — Other Ambulatory Visit: Payer: Self-pay

## 2020-01-03 DIAGNOSIS — B20 Human immunodeficiency virus [HIV] disease: Secondary | ICD-10-CM

## 2020-01-03 MED ORDER — DESCOVY 200-25 MG PO TABS: 1.0000 | ORAL_TABLET | Freq: Every day | ORAL | 0 refills | Status: DC

## 2020-01-03 MED ORDER — TIVICAY 50 MG PO TABS: ORAL_TABLET | ORAL | 0 refills | Status: DC

## 2020-01-04 ENCOUNTER — Other Ambulatory Visit: Payer: Self-pay

## 2020-01-04 ENCOUNTER — Ambulatory Visit (INDEPENDENT_AMBULATORY_CARE_PROVIDER_SITE_OTHER): Payer: Medicaid Other | Admitting: Thoracic Surgery (Cardiothoracic Vascular Surgery)

## 2020-01-04 ENCOUNTER — Encounter: Payer: Self-pay | Admitting: Thoracic Surgery (Cardiothoracic Vascular Surgery)

## 2020-01-04 VITALS — BP 165/104 | HR 93 | Temp 98.1°F | Resp 20 | Ht 70.0 in | Wt 148.0 lb

## 2020-01-04 DIAGNOSIS — J984 Other disorders of lung: Secondary | ICD-10-CM

## 2020-01-04 NOTE — Progress Notes (Signed)
[address]       [place] [address]             [phone]    HPI: [name] returns for follow-up of multiple cavitary lung nodules   Calvin Schroeder is a 59 year old man with history of tobacco abuse, COPD, pulmonary MAI, HIV, Kaposi's sarcoma, and chronic neck and back pain.  He presented in the fall 2018 with respiratory issues.  On CT of the chest there was a complex consolidative process in the left upper lobe with cavitation.  Navigational bronchoscopy showed granulomas and cultures grew MAI.  He was treated with antibiotics.  He has been followed since then with CT scans for multiple pulmonary nodules.  His most recent visit was in July 2021.  In the interim since that visit he has recently been changed to Zyvox and Arikace.  He is tolerating that regimen well.  His primary complaint currently is severe neck and shoulder pain and numbness in his left arm.  He is scheduled to have cervical spine surgery in the near future.  Past Medical History:  Diagnosis Date  . Back pain   . COPD (chronic obstructive pulmonary disease) (HCC)    emphysema   . Dyspnea    with exertion   . HIV (human immunodeficiency virus infection) ([place])   . 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 Pneumonia    11/2016  . Tobacco abuse 06/10/2013    Current Outpatient Medications  Medication Sig Dispense Refill  . albuterol (VENTOLIN HFA) 108 (90 Base) MCG/ACT inhaler Inhale 2 puffs into the lungs every 6 (six) hours as needed for wheezing or shortness of breath. 8 g 5  . Amikacin Sulfate Liposome (ARIKAYCE) 590 MG/8.4ML SUSP Inhale 590 mg into the lungs daily. 252 mL 11  . benzonatate (TESSALON) 200 MG capsule Take 1 capsule (200 mg total) by mouth every 6 (six) hours as needed for cough. 30 capsule 1  . Cholecalciferol 25 MCG (1000 UT) capsule Take 1,000 Units by mouth daily.    . dolutegravir (TIVICAY) 50 MG tablet TAKE 1 TABLET(50 MG) BY MOUTH DAILY 90 tablet 0  . emtricitabine-tenofovir  AF (DESCOVY) 200-25 MG tablet Take 1 tablet by mouth daily. 90 tablet 0  . ipratropium-albuterol (DUONEB) 0.5-2.5 (3) MG/3ML SOLN Take 3 mLs by nebulization every 6 (six) hours as needed. (Patient taking differently: Take 3 mLs by nebulization every 6 (six) hours as needed (shortness of breath/wheezing).) 360 mL 5  . linezolid (ZYVOX) 600 MG tablet Take 1 tablet (600 mg total) by mouth daily. 30 tablet 5  . Misc. Devices KIT Flutter Valve 1 kit 0  . Omadacycline Tosylate 150 MG TABS Take 300 mg by mouth daily. 60 tablet 5  . ondansetron (ZOFRAN ODT) 4 MG disintegrating tablet Take 1 tablet (4 mg total) by mouth every 8 (eight) hours as needed for nausea or vomiting. 20 tablet 0  . oxyCODONE-acetaminophen (PERCOCET) 10-325 MG tablet Take 1 tablet by mouth every 6 (six) hours as needed.    . Tiotropium Bromide-Olodaterol (STIOLTO RESPIMAT) 2.5-2.5 MCG/ACT AERS Inhale 2 puffs into the lungs daily. 4 g 6   No current facility-administered medications for this visit.    Physical Exam BP (!) 165/104 (BP Location: Right Arm, Patient Position: Sitting)   Pulse 93   Temp 98.1 F (36.7 C)   Resp 20   Ht '5\' 10"'  (1.778 m)   Wt 148 lb (67.1 kg)   SpO2 95%  Comment: RA with mask on  BMI 21.64 kg/m  59 year old man in no acute distress Alert and oriented x3 with decreased sensation left hand Lungs diminished bilaterally no rales or wheezing Cardiac regular rate and rhythm  Diagnostic Tests: CT CHEST WITHOUT CONTRAST  TECHNIQUE: Multidetector CT imaging of the chest was performed following the standard protocol without IV contrast.  COMPARISON:  06/16/2019  FINDINGS: Cardiovascular: Normal caliber thoracic aorta. Normal caliber central pulmonary vasculature. Normal heart size. No pericardial effusion. Scattered coronary artery calcifications. Limited assessment of cardiovascular structures given lack of intravenous contrast.  Mediastinum/Nodes: Thoracic inlet structures are normal. No  axillary lymphadenopathy. No hilar lymphadenopathy grossly with limited assessment without contrast. No mediastinal lymphadenopathy  Lungs/Pleura: Cavity in the LEFT upper lobe measures 6.4 x 3.1 cm (image 47, series 7)  Small upper lobe pulmonary nodules have diminished in size and conspicuity since previous imaging, for instance in area of nodularity along bandlike changes in the LEFT upper lobe on the prior exam on image 33 of series 8 is now purely linear in terms of its morphologic characteristics on axial imaging  Discrete nodule on the inferior margin of the cavitary focus measures 6 mm as compared to 9 mm on the prior study (image 52, series 7)  Other small nodules seen on image 62 of series 7, largest approximately 6 mm stable compared to prior imaging. Tree-in-bud opacities and nodularity in the lingula are unchanged.  RIGHT upper lobe bilobed nodule measuring 1.5 x 1.1 cm previously approximately 1.6 x 1.1 cm  Fissural distortion along the major fissure associated with a RIGHT upper lobe nodule that measures approximately 1.6 cm, when measured by this observer on the prior study in a similar fashion this measured 1.7 cm unchanged dating back to December of 2020  No new suspicious pulmonary nodule.  Signs of paraseptal and centrilobular pulmonary emphysema. Airways are patent.  Upper Abdomen: Incidental imaging of upper abdominal contents without acute process. Imaged portions of liver, adrenal glands, spleen, pancreas and kidneys without visible abnormality on limited assessment. No upper abdominal adenopathy.  Musculoskeletal: No acute musculoskeletal process. Spinal degenerative changes.  IMPRESSION: 1. Continued decrease in size of LEFT upper lobe cavitary area compatible with post infectious scarring and cavitation. Diminished nodularity about this area compatible with post infectious or inflammatory changes. 2. Nodules in the RIGHT chest are  stable to slightly diminished in size but within 1 mm of previous measurements. Continued attention on follow-up is suggested, stable since December 2020, shown to be cavitary in August of 2020 also likely post infectious but with limited change in size. 3. Emphysema and aortic atherosclerosis.  Aortic Atherosclerosis (ICD10-I70.0) and Emphysema (ICD10-J43.9).   Electronically Signed   By: Zetta Bills M.D.   On: 12/07/2019 15:41 I personally reviewed the CT images and concur with the findings noted above  Impression: Calvin Schroeder is a 59 year old man with history of tobacco abuse, COPD, pulmonary MAI, HIV, Kaposi's sarcoma, and chronic neck and back pain.   Cavitary lung nodules-primary lesion was left upper lobe.  That was biopsied and showed granulomas and grew MAI.  He is on treatment for that.  Over time that has decreased in size.  Consistent with a granulomatous process.  He will always have a cavitary lesion there.  He has multiple other lung nodules including some spiculated nodules in the right upper lobe.  Those are stable to slightly decreased in size.  Those nodules are most likely also granulomatous disease.  Continued close follow-up is warranted  because of his smoking history, so there is a chance of 1 of those turning out to be malignant.  Tobacco abuse-unfortunately continues to smoke.  Has been resistant to efforts.  COPD-symptoms reasonably well controlled at present.  Plan: Return in 6 months with CT chest  [name], MD [facility] [phone]

## 2020-01-11 ENCOUNTER — Other Ambulatory Visit: Payer: Self-pay

## 2020-01-11 ENCOUNTER — Encounter: Payer: Self-pay | Admitting: Pulmonary Disease

## 2020-01-11 ENCOUNTER — Ambulatory Visit (INDEPENDENT_AMBULATORY_CARE_PROVIDER_SITE_OTHER): Payer: Medicaid Other | Admitting: Pulmonary Disease

## 2020-01-11 VITALS — BP 132/80 | HR 81 | Temp 97.3°F | Ht 70.0 in | Wt 142.6 lb

## 2020-01-11 DIAGNOSIS — Z720 Tobacco use: Secondary | ICD-10-CM

## 2020-01-11 DIAGNOSIS — A310 Pulmonary mycobacterial infection: Secondary | ICD-10-CM

## 2020-01-11 DIAGNOSIS — J432 Centrilobular emphysema: Secondary | ICD-10-CM

## 2020-01-11 DIAGNOSIS — J449 Chronic obstructive pulmonary disease, unspecified: Secondary | ICD-10-CM

## 2020-01-11 NOTE — Progress Notes (Signed)
Synopsis: Referred in 2019 for cavitary lung lesion, COPD by Sue Lush, PA-C. Previously a patient of Drs. McQuaid, Icard and [facility].  Subjective:   PATIENT ID: Calvin Schroeder GENDER: male DOB: 02-Feb-1960, MRN: [id number]   HPI  Chief Complaint  Patient presents with  . Establish Care    Former PC patient for emphysema. States he recently had surgery on his throat but has been doing well. Denied any breathing concerns.    Calvin Schroeder is a 60 year old male, daily smoker with a history of HIV, cavitary pulmonary MAC and COPD who returns to pulmonary clinic for follow up.   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 He denies any respiratory issues at this time. He has persistent cough that is at baseline.   He is following with infectious disease for his HIV and the MAC infection. He remains on linezolid, omadacycline and amikacin with no side effects for treatment of his MAC.   He is also following with CT Surgery regarding the cavitary lesions related to the MAC infection and they have been monitoring him with CT Chest scans.  He reports he is being scheduled for cervical spine surgery in the coming weeks.   Past Medical History:  Diagnosis Date  . Back pain   . COPD (chronic obstructive pulmonary disease) (HCC)    emphysema   . Dyspnea    with exertion   . HIV (human immunodeficiency virus infection) ([place])   . 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 Pneumonia    11/2016  . Tobacco abuse 06/10/2013     Family History  Problem Relation Age of Onset  . Cancer Mother        lung ca  . Cancer Father        skin ca  . Cancer Maternal Uncle        liver ca  . Cancer Maternal Grandmother        bladder ca     Social History   Socioeconomic History  . Marital status: Single    Spouse name: Not on file  . Number of children: Not on file  . Years of education: Not on file  . Highest education level: Not on file  Occupational  History  . Not on file  Tobacco Use  . Smoking status: Current Every Day Smoker    Packs/day: 1.00    Years: 40.00    Pack years: 40.00    Types: Cigarettes  . Smokeless tobacco: Never Used  . Tobacco comment: 1/2 ppd as of 09/10/19  Vaping Use  . Vaping Use: Never used  Substance and Sexual Activity  . Alcohol use: Yes    Comment: daily - 3-4 alcohol  . Drug use: Not Currently    Types: Marijuana    Comment: daily   . Sexual activity: Not Currently    Partners: Female    Comment: refused  Other Topics Concern  . Not on file  Social History Narrative  . Not on file   Social Determinants of Health   Financial Resource Strain: Not on file  Food Insecurity: Not on file  Transportation Needs: Not on file  Physical Activity: Not on file  Stress: Not on file  Social Connections: Not on file  Intimate Partner Violence: Not on file     Allergies  Allergen Reactions  . Rifampin     Reports he could not function for 4 days  Other  reaction(s): Other Reports he could not function for 4 days      Outpatient Medications Prior to Visit  Medication Sig Dispense Refill  . albuterol (VENTOLIN HFA) 108 (90 Base) MCG/ACT inhaler Inhale 2 puffs into the lungs every 6 (six) hours as needed for wheezing or shortness of breath. 8 g 5  . Amikacin Sulfate Liposome (ARIKAYCE) 590 MG/8.4ML SUSP Inhale 590 mg into the lungs daily. 252 mL 11  . benzonatate (TESSALON) 200 MG capsule Take 1 capsule (200 mg total) by mouth every 6 (six) hours as needed for cough. 30 capsule 1  . Cholecalciferol 25 MCG (1000 UT) capsule Take 1,000 Units by mouth daily.    . dolutegravir (TIVICAY) 50 MG tablet TAKE 1 TABLET(50 MG) BY MOUTH DAILY 90 tablet 0  . emtricitabine-tenofovir AF (DESCOVY) 200-25 MG tablet Take 1 tablet by mouth daily. 90 tablet 0  . ipratropium-albuterol (DUONEB) 0.5-2.5 (3) MG/3ML SOLN Take 3 mLs by nebulization every 6 (six) hours as needed. (Patient taking differently: Take 3 mLs by  nebulization every 6 (six) hours as needed (shortness of breath/wheezing).) 360 mL 5  . linezolid (ZYVOX) 600 MG tablet Take 1 tablet (600 mg total) by mouth daily. 30 tablet 5  . Misc. Devices KIT Flutter Valve 1 kit 0  . Omadacycline Tosylate 150 MG TABS Take 300 mg by mouth daily. 60 tablet 5  . ondansetron (ZOFRAN ODT) 4 MG disintegrating tablet Take 1 tablet (4 mg total) by mouth every 8 (eight) hours as needed for nausea or vomiting. 20 tablet 0  . oxyCODONE-acetaminophen (PERCOCET) 10-325 MG tablet Take 1 tablet by mouth every 6 (six) hours as needed.    . Tiotropium Bromide-Olodaterol (STIOLTO RESPIMAT) 2.5-2.5 MCG/ACT AERS Inhale 2 puffs into the lungs daily. 4 g 6   No facility-administered medications prior to visit.    Review of Systems  Constitutional: Negative for chills, diaphoresis, fever, malaise/fatigue and weight loss.  HENT: Negative for congestion, sinus pain and sore throat.   Eyes: Negative.   Respiratory: Positive for cough. Negative for hemoptysis, sputum production, shortness of breath and wheezing.   Cardiovascular: Negative for chest pain, palpitations, orthopnea and leg swelling.  Gastrointestinal: Negative for abdominal pain, heartburn, nausea and vomiting.  Genitourinary: Negative.   Musculoskeletal: Negative.   Skin: Negative for rash.  Neurological: Negative.   Endo/Heme/Allergies: Negative.   Psychiatric/Behavioral: Negative.    Objective:   Vitals:   01/11/20 0912  BP: 132/80  Pulse: 81  Temp: (!) 97.3 F (36.3 C)  TempSrc: Temporal  SpO2: 99%  Weight: 142 lb 9.6 oz (64.7 kg)  Height: _0  (1.778 m)    Physical Exam Constitutional:      General: He is not in acute distress.    Appearance: Normal appearance. He is normal weight.  HENT:     Head: Normocephalic and atraumatic.  Eyes:     General: No scleral icterus.    Conjunctiva/sclera: Conjunctivae normal.     Pupils: Pupils are equal, round, and reactive to light.  Cardiovascular:      Rate and Rhythm: Normal rate.     Pulses: Normal pulses.     Heart sounds: Normal heart sounds.  Pulmonary:     Effort: Pulmonary effort is normal.     Breath sounds: Normal breath sounds.  Abdominal:     General: Bowel sounds are normal.     Palpations: Abdomen is soft.  Musculoskeletal:     Right lower leg: No edema.     Left  lower leg: No edema.  Neurological:     General: No focal deficit present.     Mental Status: He is alert.  Psychiatric:        Mood and Affect: Mood normal.        Behavior: Behavior normal.        Thought Content: Thought content normal.        Judgment: Judgment normal.     CBC    Component Value Date/Time   WBC 4.9 09/22/2019 0856   RBC 4.54 09/22/2019 0856   HGB 17.1 09/22/2019 0856   HGB 14.3 12/10/2016 1042   HCT 47.8 09/22/2019 0856   HCT 42.1 12/10/2016 1042   PLT 241 09/22/2019 0856   PLT 121 (L) 12/10/2016 1042   MCV 105.3 (H) 09/22/2019 0856   MCV 98.9 (H) 12/10/2016 1042   MCH 37.7 (H) 09/22/2019 0856   MCHC 35.8 09/22/2019 0856   RDW 14.9 09/22/2019 0856   RDW 12.6 12/10/2016 1042   LYMPHSABS 0.7 07/02/2019 0811   LYMPHSABS 0.7 (L) 12/10/2016 1042   MONOABS 0.7 07/02/2019 0811   MONOABS 0.4 12/10/2016 1042   EOSABS 0.0 07/02/2019 0811   EOSABS 0.1 12/10/2016 1042   BASOSABS 0.0 07/02/2019 0811   BASOSABS 0.0 12/10/2016 1042   BMP Latest Ref Rng & Units 09/22/2019 07/02/2019 12/10/2018  Glucose 65 - 99 mg/dL 95 110(H) 95  BUN 7 - 25 mg/dL _0 Creatinine 0.70 - 1.33 mg/dL 0.86 0.83 0.90  BUN/Creat Ratio 6 - 22 (calc) NOT APPLICABLE - NOT APPLICABLE  Sodium 701 - 146 mmol/L 139 129(L) 140  Potassium 3.5 - 5.3 mmol/L 4.8 3.9 4.2  Chloride 98 - 110 mmol/L 102 91(L) 105  CO2 20 - 32 mmol/L _1 Calcium 8.6 - 10.3 mg/dL 9.0 8.5(L) 9.1   Chest imaging: CT Chest 12/07/19 1. Continued decrease in size of LEFT upper lobe cavitary area compatible with post infectious scarring and cavitation. Diminished nodularity  about this area compatible with post infectious or inflammatory changes. 2. Nodules in the RIGHT chest are stable to slightly diminished in size but within 1 mm of previous measurements. Continued attention on follow-up is suggested, stable since December 2020, shown to be cavitary in August of 2020 also likely post infectious but with limited change in size. 3. Emphysema and aortic atherosclerosis.  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   He is doing well from a COPD standpoint and will continue on stiolto respimat daily along with as needed albuterol.   He is following with ID for the MAC infection and is to continue his regimen of arykace, omadacycline and zyvox.  His cavitary lesions are improving since treatment began for the MAC infection. The other pulmonary nodules remain stable. CT Surgery is following and have been ordering surveillance chest scans.   Follow up in 6 months  Freda Jackson, MD [place] Office: [phone]   See Amion for Pager Details  Current Outpatient Medications:  .  albuterol (VENTOLIN HFA) 108 (90 Base) MCG/ACT inhaler, Inhale 2 puffs into the lungs every 6 (six) hours as needed for wheezing or shortness of breath., Disp: 8 g, Rfl: 5 .  Amikacin Sulfate Liposome (ARIKAYCE) 590 MG/8.4ML SUSP, Inhale 590 mg into the lungs daily., Disp: 252 mL, Rfl: 11 .  benzonatate (TESSALON) 200 MG capsule, Take 1 capsule (200 mg total) by mouth every 6 (six) hours as  needed for cough., Disp: 30 capsule, Rfl: 1 .  Cholecalciferol 25 MCG (1000 UT) capsule, Take 1,000 Units by mouth daily., Disp: , Rfl:  .  dolutegravir (TIVICAY) 50 MG tablet, TAKE 1 TABLET(50 MG) BY MOUTH DAILY, Disp: 90 tablet, Rfl: 0 .  emtricitabine-tenofovir AF (DESCOVY) 200-25 MG tablet, Take 1 tablet by mouth daily., Disp: 90 tablet, Rfl: 0 .  ipratropium-albuterol (DUONEB) 0.5-2.5 (3) MG/3ML SOLN, Take 3 mLs by nebulization every 6 (six) hours as needed. (Patient taking differently: Take 3 mLs by nebulization every 6 (six) hours as needed (shortness of breath/wheezing).), Disp: 360 mL, Rfl: 5 .  linezolid (ZYVOX) 600 MG tablet, Take 1 tablet (600 mg total) by mouth daily., Disp: 30 tablet, Rfl: 5 .  Misc. Devices KIT, Flutter Valve, Disp: 1 kit, Rfl: 0 .  Omadacycline Tosylate 150 MG TABS, Take 300 mg by mouth daily., Disp: 60 tablet, Rfl: 5 .  ondansetron (ZOFRAN ODT) 4 MG disintegrating tablet, Take 1 tablet (4 mg total) by mouth every 8 (eight) hours as needed for nausea or vomiting., Disp: 20 tablet, Rfl: 0 .  oxyCODONE-acetaminophen (PERCOCET) 10-325 MG tablet, Take 1 tablet by mouth every 6 (six) hours as needed., Disp: , Rfl:  .  Tiotropium Bromide-Olodaterol (STIOLTO RESPIMAT) 2.5-2.5 MCG/ACT AERS, Inhale 2 puffs into the lungs daily., Disp: 4 g, Rfl: 6

## 2020-01-11 NOTE — Patient Instructions (Addendum)
Continue Stiolto respimat 2 puffs daily   Call if you develop worsening cough, shortness of breath, or wheezing

## 2020-01-21 ENCOUNTER — Ambulatory Visit (INDEPENDENT_AMBULATORY_CARE_PROVIDER_SITE_OTHER): Payer: Medicaid Other | Admitting: Infectious Diseases

## 2020-01-21 ENCOUNTER — Encounter: Payer: Self-pay | Admitting: Infectious Diseases

## 2020-01-21 ENCOUNTER — Other Ambulatory Visit: Payer: Self-pay

## 2020-01-21 VITALS — BP 160/104 | HR 101 | Temp 98.7°F | Wt 150.6 lb

## 2020-01-21 DIAGNOSIS — B20 Human immunodeficiency virus [HIV] disease: Secondary | ICD-10-CM

## 2020-01-21 DIAGNOSIS — Z113 Encounter for screening for infections with a predominantly sexual mode of transmission: Secondary | ICD-10-CM | POA: Diagnosis not present

## 2020-01-21 DIAGNOSIS — Z79899 Other long term (current) drug therapy: Secondary | ICD-10-CM | POA: Diagnosis not present

## 2020-01-21 DIAGNOSIS — Z720 Tobacco use: Secondary | ICD-10-CM | POA: Diagnosis not present

## 2020-01-21 DIAGNOSIS — A319 Mycobacterial infection, unspecified: Secondary | ICD-10-CM

## 2020-01-21 DIAGNOSIS — J439 Emphysema, unspecified: Secondary | ICD-10-CM

## 2020-01-21 DIAGNOSIS — M542 Cervicalgia: Secondary | ICD-10-CM | POA: Insufficient documentation

## 2020-01-21 DIAGNOSIS — A318 Other mycobacterial infections: Secondary | ICD-10-CM

## 2020-01-21 NOTE — Addendum Note (Signed)
Addended by: Jhene Westmoreland C on: 01/21/2020 10:59 AM   Modules accepted: Orders

## 2020-01-21 NOTE — Assessment & Plan Note (Signed)
Encourage him to quit, he states he has cut back.

## 2020-01-21 NOTE — Assessment & Plan Note (Signed)
Encouraged him to take his rx Encouraged him to take regularly to avoid resistance.  His CT is encouraging.

## 2020-01-21 NOTE — Assessment & Plan Note (Signed)
Awaiting surgery, at [facility].

## 2020-01-21 NOTE — Assessment & Plan Note (Signed)
He appears to be doing well.  Will set him up for shingles vax.  His other vax are up todate.  Will see if he can get COVID #4 in Feb. Offered/refused condoms.  rtc in 6 months.

## 2020-01-21 NOTE — Progress Notes (Signed)
Subjective:    Patient ID: Calvin Schroeder, male  DOB: 09-Aug-1960, 60 y.o.        MRN: [id number]  HPI  60yo M previously found to have cystic lesion on chest CT on adm for PNA. 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 He was seen in ID3-2019 was found to have AIDS.  He was started on E/A/R however developed n/v (that persisted despite zofran) RestartedMAI rx 11-05-17.He states he took this starting in Dec.  He was started on tivicay-descovey. He has had f/u with pulm for COPD. He has also f/u with CVTS for a cyctic mass in LUL. His last CVTS f/u was 06-2019- He had repeat CT at that time that showed: 1. The large thick-walled, cavitary left upper lobe lesion has not significantly changed in size from the most recent CT and is most consistent with postinflammatory scarring given relative stability for several years. 2. There is increased multifocal nodularity at the left apex superior to this cavitary lesion, which appears inflammatory. Continued follow-up in 6-12 months suggested. 3. Additional bilateral pulmonary nodules are stable. No adenopathy or pleural effusion. 4. Aortic Atherosclerosis (ICD10-I70.0) and Emphysema (ICD10-J43.9).  He had f/u appt with Pulm 07-09-19 and had repeat Cx showing M abscessus.    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  Doxycycline 16.0 ug/mL Resistant   Imipenem Comment   Comment: 8.0 ug/mL Intermediate  Linezolid Comment   Comment: 1.0 ug/mL or less, Susceptible  Minocycline >8.0 ug/mL Resistant   Moxifloxacin Comment   Comment: 2.0 ug/mL Intermediate  Tigecycline 0.25 ug/mL   Tobramycin CANCELED   Comment: Test not performed   Result canceled by the ancillary.   Trimethoprim/Sulfa Comment   Comment:  >8/152 ug/mL Resistant   He has also been seen by Onc for KS. Hewas seen in ID and by pharm in August- his M abscessus therapy was changed to clofazamine (which he has not been able to tolerate due to n/v), linezolid and omdacycline. He did not take.  September 2021, he was given arikace, he states he will start this tomorow. He quit taking the clofazamine (diarrhea). He also took zyvox which made him nauseated. He did not start omadacycline, took scared. October 2021- not clear he was taking any of his M abscessus therapy. Reinforced need to take.  November 30-21 CT scan- 1. Continued decrease in size of LEFT upper lobe cavitary area compatible with post infectious scarring and cavitation. Diminished nodularity about this area compatible with post infectious or inflammatory changes. 2. Nodules in the RIGHT chest are stable to slightly diminished in size but within 1 mm of previous measurements. Continued attention on follow-up is suggested, stable since December 2020, shown to be cavitary in August of 2020 also likely post infectious but with limited change in size. 3. Emphysema and aortic atherosclerosis.  He has had intermittent adherence to his M abscesses rx (better with the arykace, difficult with timing of meds and meals).    He denies any issues with his tivicay-descovy. No missed doses.   Today he complains of neck pain. He is planned to have this but not yet scheduled.  He has had the neck mass removed 3 weeks prior. (12-27-19 [facility]- path- thyroglossal duct cyst).   Has had 3 COVID vax (last 08-2019). Wants to know when  he can get next booster.   HIV 1 RNA Quant  Date Value  09/22/2019 <20 Copies/mL  12/10/2018 63 copies/mL (H)  03/16/2018 <20 DETECTED copies/mL (A)   CD4 T Cell Abs (/uL)  Date Value  09/22/2019 292 (L)  12/10/2018 256 (L)  03/16/2018 230 (L)     Health Maintenance  Topic Date Due  . COVID-19 Vaccine (4 - Booster for Moderna series) 02/25/2020   . TETANUS/TDAP  07/02/2028  . COLONOSCOPY (Pts 45-9yrs Insurance coverage will need to be confirmed)  01/07/2029  . INFLUENZA VACCINE  Completed  . Hepatitis C Screening  Completed  . HIV Screening  Completed      Review of Systems  Constitutional: Negative for chills and fever.  Respiratory: Positive for sputum production. Negative for cough and shortness of breath.   Cardiovascular: Negative for chest pain.  Gastrointestinal: Negative for constipation and diarrhea.  Genitourinary: Negative for dysuria.  Neurological: Negative for headaches.    Please see HPI. All other systems reviewed and negative.     Objective:  Physical Exam Vitals reviewed.  Constitutional:      General: He is not in acute distress.    Appearance: He is normal weight. He is not ill-appearing.  HENT:     Mouth/Throat:     Mouth: Mucous membranes are dry.  Eyes:     Extraocular Movements: Extraocular movements intact.     Pupils: Pupils are equal, round, and reactive to light.  Cardiovascular:     Rate and Rhythm: Normal rate and regular rhythm.  Pulmonary:     Effort: Pulmonary effort is normal.     Breath sounds: Normal breath sounds.  Abdominal:     General: Abdomen is flat. Bowel sounds are normal. There is no distension.     Palpations: Abdomen is soft.     Tenderness: There is no abdominal tenderness.  Musculoskeletal:        General: Normal range of motion.     Cervical back: Normal range of motion and neck supple.     Right lower leg: No edema.     Left lower leg: No edema.  Neurological:     General: No focal deficit present.     Mental Status: He is alert.  Psychiatric:        Mood and Affect: Mood normal.            Assessment & Plan:

## 2020-01-21 NOTE — Assessment & Plan Note (Signed)
He appears to be doing well.  Appreciate pulm f/u.

## 2020-01-28 ENCOUNTER — Ambulatory Visit: Payer: Medicaid Other | Admitting: Infectious Diseases

## 2020-04-03 ENCOUNTER — Other Ambulatory Visit: Payer: Self-pay | Admitting: Infectious Diseases

## 2020-04-03 ENCOUNTER — Other Ambulatory Visit: Payer: Self-pay

## 2020-04-03 DIAGNOSIS — B20 Human immunodeficiency virus [HIV] disease: Secondary | ICD-10-CM

## 2020-04-03 MED ORDER — TIVICAY 50 MG PO TABS: ORAL_TABLET | ORAL | 1 refills | Status: DC

## 2020-04-05 ENCOUNTER — Telehealth: Payer: Self-pay | Admitting: Pulmonary Disease

## 2020-04-05 MED ORDER — IPRATROPIUM-ALBUTEROL 0.5-2.5 (3) MG/3ML IN SOLN: 3.0000 mL | Freq: Four times a day (QID) | RESPIRATORY_TRACT | 5 refills | Status: DC | PRN

## 2020-04-05 NOTE — Telephone Encounter (Signed)
Left detailed message per DPR that refills for Duoneb were sent to pharmacy. Nothing further needed at this time.

## 2020-05-02 ENCOUNTER — Telehealth: Payer: Self-pay | Admitting: Pulmonary Disease

## 2020-05-02 DIAGNOSIS — J439 Emphysema, unspecified: Secondary | ICD-10-CM

## 2020-05-02 MED ORDER — STIOLTO RESPIMAT 2.5-2.5 MCG/ACT IN AERS: 2.0000 | INHALATION_SPRAY | Freq: Every day | RESPIRATORY_TRACT | 6 refills | Status: DC

## 2020-05-02 MED ORDER — ALBUTEROL SULFATE HFA 108 (90 BASE) MCG/ACT IN AERS: 2.0000 | INHALATION_SPRAY | Freq: Four times a day (QID) | RESPIRATORY_TRACT | 5 refills | Status: DC | PRN

## 2020-05-02 NOTE — Telephone Encounter (Signed)
Called and spoke with pt and he is aware of refills have been sent to the pharmacy and the nebulizer supplies have been sent to [facility].  Nothing further is needed.

## 2020-05-23 ENCOUNTER — Other Ambulatory Visit: Payer: Self-pay | Admitting: Thoracic Surgery (Cardiothoracic Vascular Surgery)

## 2020-05-23 DIAGNOSIS — R918 Other nonspecific abnormal finding of lung field: Secondary | ICD-10-CM

## 2020-06-26 ENCOUNTER — Telehealth: Payer: Self-pay | Admitting: Physician Assistant

## 2020-06-26 NOTE — Telephone Encounter (Signed)
..   Medicaid Managed Care   Unsuccessful Outreach Note  06/26/2020 Name: Calvin Schroeder MRN: [id number] DOB: August 25, 1960  Referred by: Sue Lush, PA-C Reason for referral : High Risk Managed Medicaid (Called Mr.Wygant today to get him scheduled for a telephone visit with the [facility] Team. I left a message on his VM.)   An unsuccessful telephone outreach was attempted today. The patient was referred to the case management team for assistance with care management and care coordination.   Follow Up Plan: The care management team will reach out to the patient again over the next 7-14 days.   [place]

## 2020-06-29 ENCOUNTER — Other Ambulatory Visit: Payer: Self-pay

## 2020-06-29 ENCOUNTER — Ambulatory Visit
Admission: RE | Admit: 2020-06-29 | Discharge: 2020-06-29 | Disposition: A | Discharge status: Home / Self Care | Payer: Medicaid Other | Source: Ambulatory Visit | Attending: Thoracic Surgery (Cardiothoracic Vascular Surgery) | Admitting: Thoracic Surgery (Cardiothoracic Vascular Surgery)

## 2020-06-29 DIAGNOSIS — R918 Other nonspecific abnormal finding of lung field: Secondary | ICD-10-CM

## 2020-07-04 ENCOUNTER — Other Ambulatory Visit: Payer: Self-pay

## 2020-07-04 ENCOUNTER — Encounter: Payer: Self-pay | Admitting: Thoracic Surgery (Cardiothoracic Vascular Surgery)

## 2020-07-04 ENCOUNTER — Ambulatory Visit (INDEPENDENT_AMBULATORY_CARE_PROVIDER_SITE_OTHER): Payer: Medicaid Other | Admitting: Physician Assistant

## 2020-07-04 ENCOUNTER — Other Ambulatory Visit: Payer: Self-pay | Admitting: Infectious Diseases

## 2020-07-04 VITALS — BP 144/100 | HR 92 | Resp 20 | Ht 70.0 in | Wt 150.0 lb

## 2020-07-04 DIAGNOSIS — R918 Other nonspecific abnormal finding of lung field: Secondary | ICD-10-CM

## 2020-07-04 DIAGNOSIS — B20 Human immunodeficiency virus [HIV] disease: Secondary | ICD-10-CM

## 2020-07-04 MED ORDER — TIVICAY 50 MG PO TABS: ORAL_TABLET | ORAL | 0 refills | Status: DC

## 2020-07-04 MED ORDER — DESCOVY 200-25 MG PO TABS: 1.0000 | ORAL_TABLET | Freq: Every day | ORAL | 0 refills | Status: DC

## 2020-07-04 NOTE — Patient Instructions (Addendum)
Follow-up in 6 months with noncontrast chest CTscan .

## 2020-07-04 NOTE — Progress Notes (Addendum)
[address]       [place] [address]             [phone]      HPI:  Calvin Schroeder returns for follow-up of multiple cavitary lung nodules   Calvin Schroeder is a 60 year old man with history of tobacco abuse, COPD, pulmonary MAI, HIV, Kaposi's sarcoma, and chronic neck and back pain.  He presented in the fall 2018 with respiratory issues.  On CT of the chest there was a complex consolidative process in the left upper lobe with cavitation.  Navigational bronchoscopy showed granulomas and cultures grew MAI.  He was treated with antibiotics.  He has been followed since then with CT scans for multiple pulmonary nodules.  He returned today for follow-up after a repeat noncontrast CT scan of the chest on 06/29/2020.   Current Outpatient Medications  Medication Sig Dispense Refill   albuterol (VENTOLIN HFA) 108 (90 Base) MCG/ACT inhaler Inhale 2 puffs into the lungs every 6 (six) hours as needed for wheezing or shortness of breath. 8 g 5   Amikacin Sulfate Liposome (ARIKAYCE) 590 MG/8.4ML SUSP Inhale 590 mg into the lungs daily. 252 mL 11   benzonatate (TESSALON) 200 MG capsule Take 1 capsule (200 mg total) by mouth every 6 (six) hours as needed for cough. 30 capsule 1   Cholecalciferol 25 MCG (1000 UT) capsule Take 1,000 Units by mouth daily.     dolutegravir (TIVICAY) 50 MG tablet TAKE 1 TABLET(50 MG) BY MOUTH DAILY 30 tablet 0   emtricitabine-tenofovir AF (DESCOVY) 200-25 MG tablet Take 1 tablet by mouth daily. 30 tablet 0   ipratropium-albuterol (DUONEB) 0.5-2.5 (3) MG/3ML SOLN Take 3 mLs by nebulization every 6 (six) hours as needed (shortness of breath/wheezing). 360 mL 5   linezolid (ZYVOX) 600 MG tablet Take 1 tablet (600 mg total) by mouth daily. 30 tablet 5   losartan (COZAAR) 50 MG tablet Take 50 mg by mouth daily.     Misc. Devices KIT Flutter Valve 1 kit 0   Omadacycline Tosylate 150 MG TABS Take 300 mg by mouth daily. 60 tablet 5   ondansetron (ZOFRAN ODT) 4  MG disintegrating tablet Take 1 tablet (4 mg total) by mouth every 8 (eight) hours as needed for nausea or vomiting. 20 tablet 0   oxyCODONE-acetaminophen (PERCOCET) 10-325 MG tablet Take 1 tablet by mouth every 6 (six) hours as needed.     Tiotropium Bromide-Olodaterol (STIOLTO RESPIMAT) 2.5-2.5 MCG/ACT AERS Inhale 2 puffs into the lungs daily. 4 g 6   No current facility-administered medications for this visit.    Physical Exam: Blood pressure 144/100 Heart rate 92 SPO2 95% on room air      Diagnostic Tests: CLINICAL DATA:  Follow-up pulmonary lesions.   EXAM: CT CHEST WITHOUT CONTRAST   TECHNIQUE: Multidetector CT imaging of the chest was performed following the standard protocol without IV contrast.   COMPARISON:  Multiple prior chest CTs. The most recent is 12/07/2019   FINDINGS: Cardiovascular: The heart is normal in size. No pericardial effusion. Stable mild tortuosity of the thoracic aorta but no significant atherosclerotic calcifications. Stable scattered coronary artery calcifications.   Mediastinum/Nodes: Stable small scattered mediastinal and hilar lymph nodes but no mass or overt adenopathy. The esophagus is grossly.   Lungs/Pleura: Stable severe emphysematous changes and chronic scarring. No acute superimposed pulmonary process.   Stable complex cavitary "mass" in the left upper lobe likely chronic post infectious process. No findings suspicious for  neoplasm given its stability over time.   Branching appearing nodular lesion in the right upper lobe on image number 67/[id number] this measures a maximum 16 mm and overall appears stable.   Slightly more inferiorly and posteriorly in the right upper lobe is a stable irregular nodular lesion measuring approximately 18 x 13 mm. This is also stable when remeasured in the same planes.   There are several other small scattered nodules in both lungs which are stable.   Stable cluster of nodules in the lingula  likely chronic inflammation or possibly atypical infection such as MAC.   No new pulmonary lesions or new pulmonary nodules. No pleural effusions or pleural lesions.   Upper Abdomen: No significant upper abdominal findings.   Musculoskeletal: No chest wall mass, supraclavicular or axillary adenopathy. The thyroid gland is unremarkable. The bony thorax is intact.   IMPRESSION: 1. Stable complex cavitary "mass" in the left upper lobe likely chronic post infectious process. No findings suspicious for neoplastic process. 2. Stable irregular nodular lesions in the right upper lobe and stable scattered nodules throughout both lungs. No new or progressive findings. Recommend continued routine surveillance. 3. Stable severe emphysematous changes and chronic scarring. 4. Stable cluster of nodules in the lingula likely chronic inflammation or possibly atypical infection such as MAC. 5. Stable small scattered mediastinal and hilar lymph nodes.   Aortic Atherosclerosis (ICD10-I70.0) and Emphysema (ICD10-J43.9).     Electronically Signed   By: Marijo Sanes M.D.   On: 06/29/2020 11:31    Impression /  Plan: 60 year old gentleman with history of tobacco abuse, COPD, pulmonary MAI, HIV, Kaposi's sarcoma presents for 68-monthfollow-up of pulmonary cavitary lesions.  CT scan was reviewed today by Dr. HRoxan Hockey  No significant change in the old lesions and no significant new lesions are identified.  Plan follow-up with repeat CT scan in 6 months.  He is encouraged to follow-up with Dr. HJohnnye Sima infectious disease, as scheduled.   I spent over 20 minutes reviewing images and consultation with this gentleman regarding his lung disease.  MAntony Odea PA-C [facility] ([phone] I saw and examined Calvin Schroeder  He asked about early signs of lung cancer.  I explained that there are no early signs We are following these with CT to see if there is any  suspicious changes.  Currently everything is stable and consistent with mycobacterial infection.  He is being treated by Dr. HJohnnye Sima   We will plan to see back in 6 months with a CT chest.  SRemo LippsC. HRoxan Hockey MD [facility] ([phone]

## 2020-07-18 ENCOUNTER — Telehealth: Payer: Self-pay | Admitting: Physician Assistant

## 2020-07-18 NOTE — Telephone Encounter (Signed)
  Medicaid Managed Care   Unsuccessful Outreach Note  07/18/2020 Name: Calvin Schroeder MRN: [id number] DOB: 1960-04-23  Referred by: Sue Lush, PA-C Reason for referral : High Risk Managed Medicaid (Attempted to reach patient today to get him scheduled with the [facility] MM Team for a phone visit. I left my name and number on his VM.)   A second unsuccessful telephone outreach was attempted today. The patient was referred to the case management team for assistance with care management and care coordination.   Follow Up Plan: The care management team will reach out to the patient again over the next 7-14 days.   [place]

## 2020-07-21 ENCOUNTER — Encounter: Payer: Self-pay | Admitting: Infectious Diseases

## 2020-07-21 ENCOUNTER — Other Ambulatory Visit: Payer: Self-pay

## 2020-07-21 ENCOUNTER — Other Ambulatory Visit: Payer: Self-pay | Admitting: Pharmacist

## 2020-07-21 ENCOUNTER — Other Ambulatory Visit (HOSPITAL_COMMUNITY): Payer: Self-pay

## 2020-07-21 ENCOUNTER — Telehealth: Payer: Self-pay

## 2020-07-21 ENCOUNTER — Ambulatory Visit (INDEPENDENT_AMBULATORY_CARE_PROVIDER_SITE_OTHER): Payer: Medicaid Other | Admitting: Infectious Diseases

## 2020-07-21 DIAGNOSIS — R918 Other nonspecific abnormal finding of lung field: Secondary | ICD-10-CM

## 2020-07-21 DIAGNOSIS — M542 Cervicalgia: Secondary | ICD-10-CM | POA: Diagnosis not present

## 2020-07-21 DIAGNOSIS — I10 Essential (primary) hypertension: Secondary | ICD-10-CM | POA: Diagnosis not present

## 2020-07-21 DIAGNOSIS — A319 Mycobacterial infection, unspecified: Secondary | ICD-10-CM | POA: Diagnosis not present

## 2020-07-21 DIAGNOSIS — A318 Other mycobacterial infections: Secondary | ICD-10-CM

## 2020-07-21 DIAGNOSIS — B20 Human immunodeficiency virus [HIV] disease: Secondary | ICD-10-CM | POA: Diagnosis not present

## 2020-07-21 DIAGNOSIS — A310 Pulmonary mycobacterial infection: Secondary | ICD-10-CM | POA: Diagnosis present

## 2020-07-21 MED ORDER — CLOFAZIMINE POWD
100.0000 mg | Freq: Every day | 2 refills | Status: DC
Start: 2020-07-21 — End: 2020-07-21

## 2020-07-21 MED ORDER — OMADACYCLINE TOSYLATE 150 MG PO TABS
300.0000 mg | ORAL_TABLET | Freq: Every day | ORAL | 11 refills | Status: DC
  Filled 2020-07-21 – 2020-07-31 (×3): qty 60, 30d supply, fill #0

## 2020-07-21 MED ORDER — LINEZOLID 600 MG PO TABS
600.0000 mg | ORAL_TABLET | Freq: Every day | ORAL | 5 refills | Status: DC
  Filled 2020-07-21: qty 30, 30d supply, fill #0

## 2020-07-21 MED ORDER — ARIKAYCE 590 MG/8.4ML IN SUSP: 590.0000 mg | Freq: Every day | RESPIRATORY_TRACT | 11 refills | Status: DC

## 2020-07-21 MED ORDER — ARIKAYCE 590 MG/8.4ML IN SUSP
590.0000 mg | Freq: Every day | RESPIRATORY_TRACT | 11 refills | Status: DC
  Filled 2020-07-21 (×2): qty 235.2, 28d supply, fill #0

## 2020-07-21 MED ORDER — OMADACYCLINE TOSYLATE 150 MG PO TABS: 300.0000 mg | ORAL_TABLET | Freq: Every day | ORAL | 5 refills | Status: DC

## 2020-07-21 NOTE — Progress Notes (Signed)
Subjective:    Patient ID: Calvin Schroeder, male  DOB: [birth year], 60 y.o.        MRN: [id number]   HPI 60 yo M previously found to have cystic lesion on chest CT on adm for PNA. 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 He was seen in ID 03-2017 was found to have AIDS. He was started on E/A/R however developed n/v (that persisted despite zofran)  Restarted MAI rx 11-05-17. He states he took this starting in Dec.    He was started on tivicay-descovey. He has had f/u with pulm for COPD. He has also f/u with CVTS for a cyctic mass in LUL.  His last CVTS f/u was 06-2019- He had repeat CT at that time that showed: 1. The large thick-walled, cavitary left upper lobe lesion has not significantly changed in size from the most recent CT and is most consistent with postinflammatory scarring given relative stability for several years. 2. There is increased multifocal nodularity at the left apex superior to this cavitary lesion, which appears inflammatory. Continued follow-up in 6-12 months suggested. 3. Additional bilateral pulmonary nodules are stable. No adenopathy or pleural effusion. 4. Aortic Atherosclerosis (ICD10-I70.0) and Emphysema (ICD10-J43.9).   He had f/u appt with Pulm 07-09-19 and had repeat Cx showing M abscessus.       Mycobacterium abscessus complex  Amikacin 4.0 ug/mL Susceptible  Cefoxitin Comment  Comment: 32.0 ug/mL Intermediate  Ciprofloxacin >4.0 ug/mL Resistant  Clarithromycin >16.0 ug/mL Resistant  Comment: This organism has been evaluated for inducible macrolide resistance.  Doxycycline 16.0 ug/mL Resistant  Imipenem Comment  Comment: 8.0 ug/mL Intermediate  Linezolid Comment  Comment: 1.0 ug/mL or less, Susceptible  Minocycline >8.0 ug/mL Resistant  Moxifloxacin Comment  Comment: 2.0 ug/mL Intermediate  Tigecycline 0.25 ug/mL  Tobramycin CANCELED  Comment: Test not performed   Result canceled by the ancillary.  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 He was seen in ID and by pharm in August- his M abscessus therapy was changed to  clofazamine (which he has not been able to tolerate due to n/v), linezolid and omdacycline. He did not take.  September 2021, he was given arikace. He quit taking the clofazamine (diarrhea). He also took zyvox which made him nauseated. He did not start omadacycline, too scared. October 2021- not clear he was taking any of his M abscessus therapy. Reinforced need to take. November 30-21 CT scan- 1. Continued decrease in size of LEFT upper lobe cavitary area compatible with post infectious scarring and cavitation. Diminished nodularity about this area compatible with post infectious or inflammatory changes. 2. Nodules in the RIGHT chest are stable to slightly diminished in size but within 1 mm of previous measurements. Continued attention on follow-up is suggested, stable since December 2020, shown to be cavitary in August of 2020 also likely post infectious but with limited change in size. 3. Emphysema and aortic atherosclerosis.   He has had intermittent adherence to his M abscesses rx (better with the arykace, difficult with timing of meds and meals).     He has had the neck mass removed 3 weeks prior. (12-27-19 [facility]- path- thyroglossal duct cyst).  He denies problems with his tivicay-descovey.  He would like refills on his MAI rx. Arykace, omadacycline, clofazamine. Clofazamine makes him nauseated but he gets zofran from his PCP. He was also started on anti-htn rx by his PCP, has f/u beginning of Aug.  Pending cervical fusion.     HIV 1 RNA Quant  Date Value  09/22/2019 <20 Copies/mL  12/10/2018 63 copies/mL (H)  03/16/2018 <20 DETECTED copies/mL (A)   CD4 T Cell Abs (/uL)  Date Value  09/22/2019 292 (L)  12/10/2018 256 (L)  03/16/2018 230 (L)     Health Maintenance  Topic Date Due   COVID-19 Vaccine (4 - Booster for Moderna series)  11/25/2019   Zoster Vaccines- Shingrix (2 of 2) 07/13/2020   INFLUENZA VACCINE  08/07/2020   Pneumococcal Vaccine 80-28 Years old (4 - PPSV23 or PCV20) 12/24/2027   TETANUS/TDAP  07/02/2028   COLONOSCOPY (Pts 45-42yrs Insurance coverage will need to be confirmed)  01/07/2029   Hepatitis C Screening  Completed   HIV Screening  Completed   HPV VACCINES  Aged Out      Review of Systems  Constitutional:  Negative for chills, fever and weight loss.  Respiratory:  Positive for cough (has not had COPD exacerbation recently. occas woken up from sleep coughing. some gagging from sore in his throat from prior surgery.). Negative for sputum production and shortness of breath.   Gastrointestinal:  Negative for constipation and diarrhea.  Genitourinary:  Negative for dysuria.  Musculoskeletal:  Positive for neck pain.   Please see HPI. All other systems reviewed and negative.     Objective:  Physical Exam Vitals reviewed.  Constitutional:      Appearance: Normal appearance.  HENT:     Mouth/Throat:     Mouth: Mucous membranes are moist.     Pharynx: No oropharyngeal exudate.  Eyes:     Extraocular Movements: Extraocular movements intact.     Pupils: Pupils are equal, round, and reactive to light.  Cardiovascular:     Rate and Rhythm: Normal rate and regular rhythm.  Pulmonary:     Effort: Pulmonary effort is normal.     Breath sounds: Normal breath sounds.  Abdominal:     General: Bowel sounds are normal. There is no distension.     Palpations: Abdomen is soft.     Tenderness: There is no abdominal tenderness.  Musculoskeletal:        General: Normal range of motion.     Cervical back: Normal range of motion.     Right lower leg: No edema.     Left lower leg: No edema.  Lymphadenopathy:     Cervical: No cervical adenopathy.  Neurological:     General: No focal deficit present.     Mental Status: He is alert.           Assessment & Plan:

## 2020-07-21 NOTE — Assessment & Plan Note (Signed)
Pending fusion.

## 2020-07-21 NOTE — Assessment & Plan Note (Addendum)
He appears to be doing well Defers labs today (pcp said my liver was a little bit off) His labs from [facility] are reviewed. His AST/ALT are < 50.  Offered/refused condoms. Refuses CD4 and viral load today.  rtc 12-2020

## 2020-07-21 NOTE — Assessment & Plan Note (Signed)
Slightly up today Appreciate his PCP managing and f/u appt.

## 2020-07-21 NOTE — Assessment & Plan Note (Addendum)
His meds are refilled: Linezolid//omadacycline/arykace Will see him back in Dec after he has his next CT scan.  We discussed stopping his meds at that time and he is hesitant

## 2020-07-21 NOTE — Assessment & Plan Note (Addendum)
His meds are refilled: Linezolid/omadacycline/arykace Will see him back in Dec after his repeat CT. CT 06-2020 1. Stable complex cavitary "mass" in the left upper lobe likely chronic post infectious process. No findings suspicious for neoplastic process. 2. Stable irregular nodular lesions in the right upper lobe and stable scattered nodules throughout both lungs. No new or progressive findings. Recommend continued routine surveillance. 3. Stable severe emphysematous changes and chronic scarring. 4. Stable cluster of nodules in the lingula likely chronic inflammation or possibly atypical infection such as MAC. 5. Stable small scattered mediastinal and hilar lymph nodes.Marland Kitchen

## 2020-07-21 NOTE — Addendum Note (Signed)
Addended by: Leily Capek C on: 07/21/2020 11:19 AM   Modules accepted: Orders

## 2020-07-21 NOTE — Addendum Note (Signed)
Addended by: Gricel Copen C on: 07/21/2020 11:45 AM   Modules accepted: Orders

## 2020-07-21 NOTE — Telephone Encounter (Signed)
[facility] Patient Advocate Encounter   Received notification from [facility] that prior authorization for Calvin Schroeder is required.   PA submitted on 07/21/20  Key [id number] Status is pending    [facility] will continue to follow.   Ileene Patrick, [facility] Patient [facility] Phone: [phone] Fax:  [phone]

## 2020-07-24 ENCOUNTER — Other Ambulatory Visit (HOSPITAL_COMMUNITY): Payer: Self-pay

## 2020-07-25 ENCOUNTER — Other Ambulatory Visit (HOSPITAL_COMMUNITY): Payer: Self-pay

## 2020-07-26 ENCOUNTER — Telehealth: Payer: Self-pay | Admitting: Pharmacist

## 2020-07-26 NOTE — Telephone Encounter (Signed)
Patient's prior authorization for omadacycline was denied. Completed and sent appeal today to insurance. Will update encounter once decision has been made.

## 2020-07-28 ENCOUNTER — Other Ambulatory Visit (HOSPITAL_COMMUNITY): Payer: Self-pay

## 2020-07-28 ENCOUNTER — Telehealth: Payer: Self-pay

## 2020-07-28 ENCOUNTER — Telehealth: Payer: Self-pay | Admitting: Pharmacist

## 2020-07-28 NOTE — Telephone Encounter (Signed)
Sent it application form to [employer] for patient teaching on [place]. He has already picked up the medication from [facility].

## 2020-07-28 NOTE — Telephone Encounter (Signed)
[facility] Patient Advocate Encounter  Prior Authorization for Elesa Hacker has been approved.    Effective dates: 07/27/20 through 07/27/21  Patients co-pay is $4.00.   [facility] will continue to follow.  Ileene Patrick, [facility] Patient [facility] Phone: [phone] Fax:  [phone]

## 2020-07-31 ENCOUNTER — Other Ambulatory Visit (HOSPITAL_COMMUNITY): Payer: Self-pay

## 2020-08-01 ENCOUNTER — Other Ambulatory Visit (HOSPITAL_COMMUNITY): Payer: Self-pay

## 2020-08-02 ENCOUNTER — Other Ambulatory Visit (HOSPITAL_COMMUNITY): Payer: Self-pay

## 2020-08-08 ENCOUNTER — Telehealth: Payer: Self-pay | Admitting: Physician Assistant

## 2020-08-08 NOTE — Telephone Encounter (Signed)
..   Medicaid Managed Care   Unsuccessful Outreach Note  08/08/2020 Name: Calvin Schroeder MRN: [id number] DOB: May 16, 1960  Referred by: Sue Lush, PA-C Reason for referral : High Risk Managed Medicaid (Attempted to reach the patient today to offer him a phone appt with the High Risk Managed Medicaid team. The recording said it was unable to find the voice mailbox.)   Third unsuccessful telephone outreach was attempted today. The patient was referred to the case management team for assistance with care management and care coordination. The patient's primary care provider has been notified of our unsuccessful attempts to make or maintain contact with the patient. The care management team is pleased to engage with this patient at any time in the future should he/she be interested in assistance from the care management team.   Follow Up Plan: We have been unable to make contact with the patient for follow up. The care management team is available to follow up with the patient after provider conversation with the patient regarding recommendation for care management engagement and subsequent re-referral to the care management team.   [place], [place]

## 2020-09-25 ENCOUNTER — Telehealth: Payer: Self-pay | Admitting: *Deleted

## 2020-09-25 NOTE — Telephone Encounter (Signed)
[facility] having difficulty reaching patient for his [place] delivery. Per pharmacist, he should be out of medication. [facility] call back is [phone]. RN called patient, was unable to leave a message due to his roboscreening program.  Will send mychart message and will 'cc pharmacy so they are aware. Landis Gandy, RN

## 2020-10-08 ENCOUNTER — Other Ambulatory Visit: Payer: Self-pay | Admitting: Infectious Diseases

## 2020-10-08 DIAGNOSIS — B20 Human immunodeficiency virus [HIV] disease: Secondary | ICD-10-CM

## 2020-10-23 ENCOUNTER — Other Ambulatory Visit: Payer: Self-pay | Admitting: Infectious Diseases

## 2020-10-23 DIAGNOSIS — B20 Human immunodeficiency virus [HIV] disease: Secondary | ICD-10-CM

## 2020-10-23 DIAGNOSIS — Z113 Encounter for screening for infections with a predominantly sexual mode of transmission: Secondary | ICD-10-CM

## 2020-11-20 ENCOUNTER — Other Ambulatory Visit: Payer: Self-pay | Admitting: Thoracic Surgery (Cardiothoracic Vascular Surgery)

## 2020-11-20 DIAGNOSIS — R918 Other nonspecific abnormal finding of lung field: Secondary | ICD-10-CM

## 2020-11-21 ENCOUNTER — Other Ambulatory Visit: Payer: Medicaid Other

## 2020-11-21 ENCOUNTER — Other Ambulatory Visit (HOSPITAL_COMMUNITY)
Admission: RE | Admit: 2020-11-21 | Discharge: 2020-11-21 | Disposition: A | Discharge status: Home / Self Care | Payer: Medicaid Other | Source: Ambulatory Visit | Attending: Infectious Diseases | Admitting: Infectious Diseases

## 2020-11-21 ENCOUNTER — Other Ambulatory Visit: Payer: Self-pay

## 2020-11-21 DIAGNOSIS — Z113 Encounter for screening for infections with a predominantly sexual mode of transmission: Secondary | ICD-10-CM | POA: Insufficient documentation

## 2020-11-21 DIAGNOSIS — B20 Human immunodeficiency virus [HIV] disease: Secondary | ICD-10-CM

## 2020-11-21 DIAGNOSIS — Z79899 Other long term (current) drug therapy: Secondary | ICD-10-CM

## 2020-11-22 LAB — URINE CYTOLOGY ANCILLARY ONLY
Chlamydia: NEGATIVE
Comment: NEGATIVE
Comment: NORMAL
Neisseria Gonorrhea: NEGATIVE

## 2020-11-22 LAB — T-HELPER CELL (CD4) - (RCID CLINIC ONLY)
CD4 % Helper T Cell: 26 % — ABNORMAL LOW (ref 33–65)
CD4 T Cell Abs: 282 /uL — ABNORMAL LOW (ref 400–1790)

## 2020-11-23 LAB — COMPREHENSIVE METABOLIC PANEL
AG Ratio: 1.1 (calc) (ref 1.0–2.5)
ALT: 55 U/L — ABNORMAL HIGH (ref 9–46)
AST: 65 U/L — ABNORMAL HIGH (ref 10–35)
Albumin: 4 g/dL (ref 3.6–5.1)
Alkaline phosphatase (APISO): 113 U/L (ref 35–144)
BUN/Creatinine Ratio: 10 (calc) (ref 6–22)
BUN: 6 mg/dL — ABNORMAL LOW (ref 7–25)
CO2: 27 mmol/L (ref 20–32)
Calcium: 8.9 mg/dL (ref 8.6–10.3)
Chloride: 98 mmol/L (ref 98–110)
Creat: 0.63 mg/dL — ABNORMAL LOW (ref 0.70–1.30)
Globulin: 3.5 g/dL (calc) (ref 1.9–3.7)
Glucose, Bld: 95 mg/dL (ref 65–99)
Potassium: 3.5 mmol/L (ref 3.5–5.3)
Sodium: 137 mmol/L (ref 135–146)
Total Bilirubin: 1 mg/dL (ref 0.2–1.2)
Total Protein: 7.5 g/dL (ref 6.1–8.1)

## 2020-11-23 LAB — HIV-1 RNA QUANT-NO REFLEX-BLD
HIV 1 RNA Quant: NOT DETECTED Copies/mL
HIV-1 RNA Quant, Log: NOT DETECTED Log cps/mL

## 2020-11-23 LAB — RPR: RPR Ser Ql: NONREACTIVE

## 2020-11-23 LAB — CBC
HCT: 50.1 % — ABNORMAL HIGH (ref 38.5–50.0)
Hemoglobin: 17.5 g/dL — ABNORMAL HIGH (ref 13.2–17.1)
MCH: 37.6 pg — ABNORMAL HIGH (ref 27.0–33.0)
MCHC: 34.9 g/dL (ref 32.0–36.0)
MCV: 107.7 fL — ABNORMAL HIGH (ref 80.0–100.0)
MPV: 10.5 fL (ref 7.5–12.5)
Platelets: 157 10*3/uL (ref 140–400)
RBC: 4.65 10*6/uL (ref 4.20–5.80)
RDW: 13.4 % (ref 11.0–15.0)
WBC: 5.1 10*3/uL (ref 3.8–10.8)

## 2020-11-23 LAB — LIPID PANEL
Cholesterol: 157 mg/dL (ref <200)
HDL: 72 mg/dL (ref >=40)
LDL Cholesterol (Calc): 67 mg/dL (calc)
Non-HDL Cholesterol (Calc): 85 mg/dL (calc) (ref <130)
Total CHOL/HDL Ratio: 2.2 (calc) (ref <5.0)
Triglycerides: 95 mg/dL (ref <150)

## 2020-12-06 IMAGING — CT CT CHEST W/O CM
2 of 4 series · 11 of 36 positions shown, 13 images · non-contrast
Comparison: Chest CT 12/23/2018, 08/24/2018 and 02/09/2018. Other
older studies dating back to 11/25/2016 are correlated.

CLINICAL DATA: Follow up pulmonary nodule. History of smoking and
COPD. No previous relevant surgery or history of malignancy.

EXAM:
CT CHEST WITHOUT CONTRAST
TECHNIQUE: Multidetector CT imaging of the chest was performed following the
standard protocol without IV contrast.

[Series 2: chest 2.00 br40 s3 · axial · 0.49mm/px · z∈[+1448,+1766]mm · 8 of 189 slices shown, 10 images (1 of 2)]
[im 15/189  mediastinal]
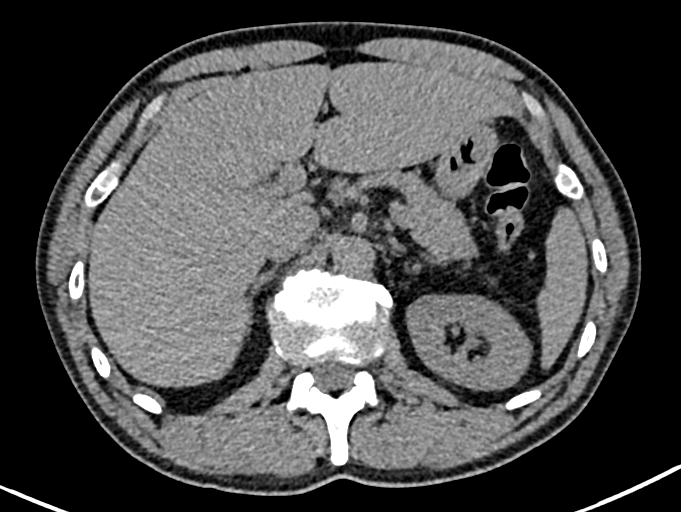
[im 15/189  lung]
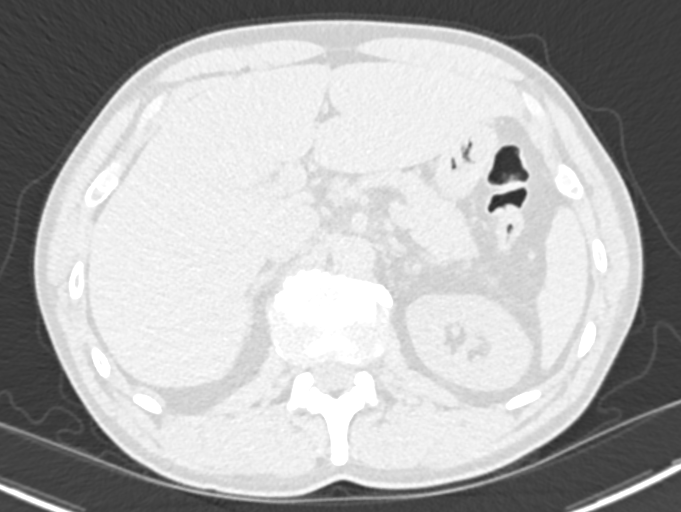
[im 44/189  lung]
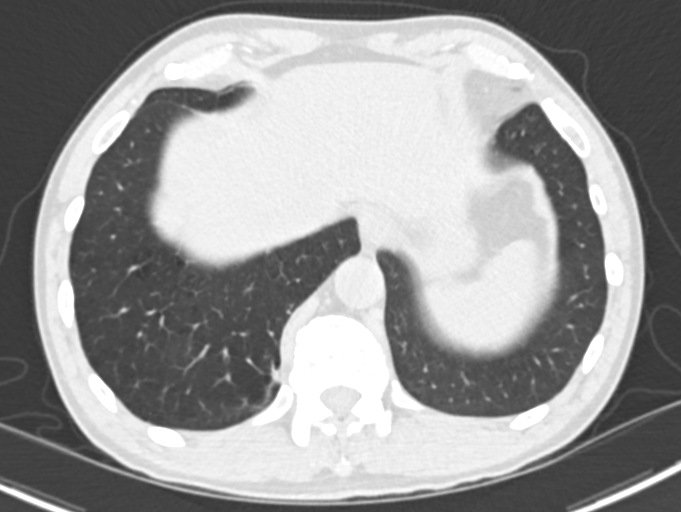
[im 58/189  lung]
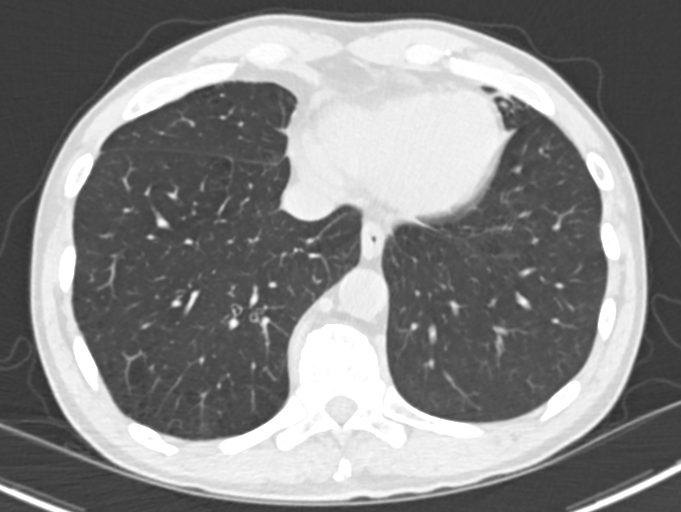
[im 87/189  lung]
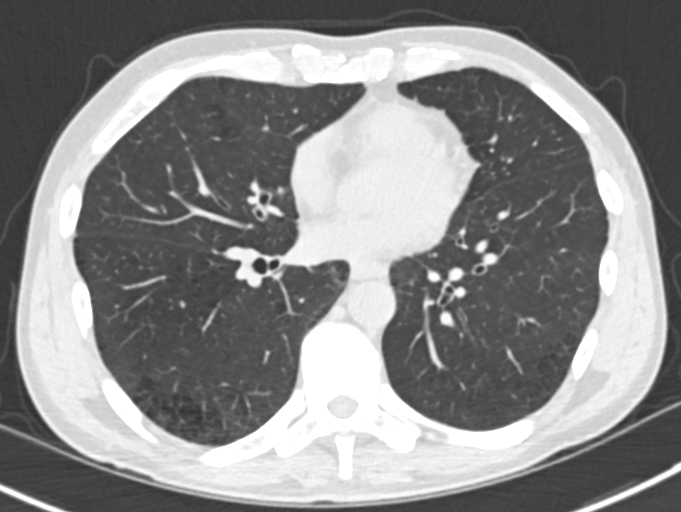
[im 102/189  mediastinal]
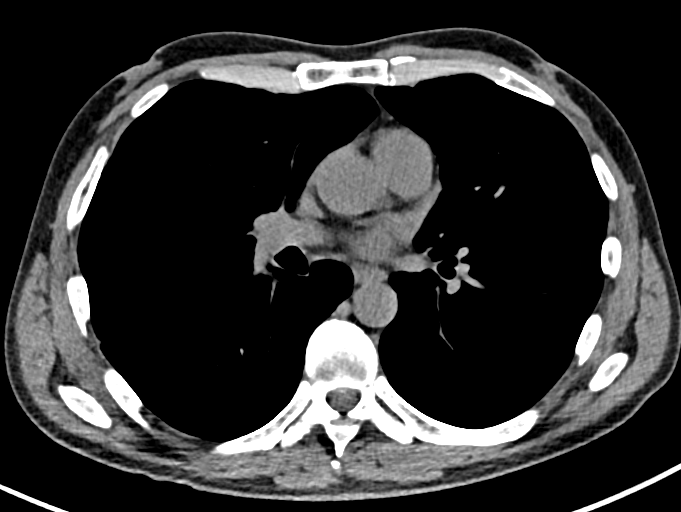
[im 102/189  lung]
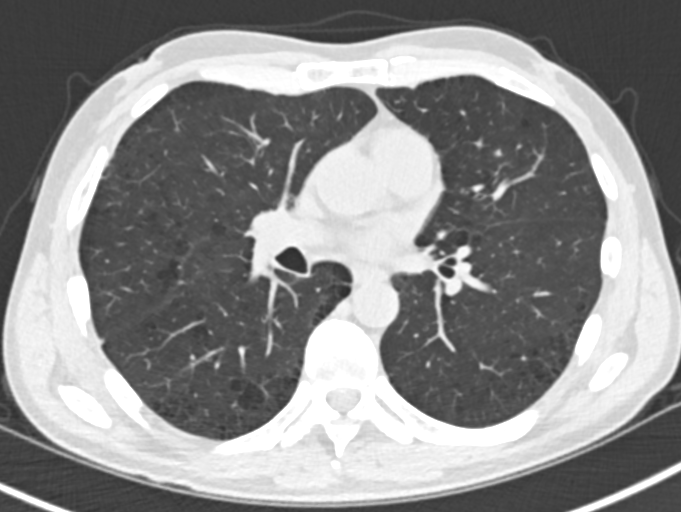
[im 131/189  lung]
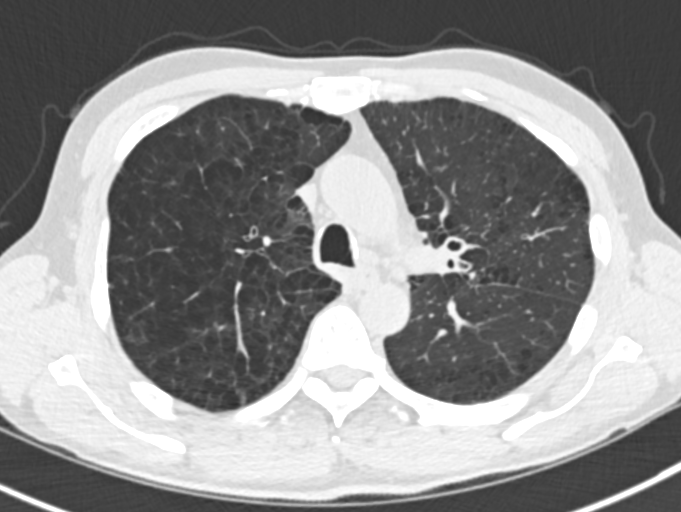
[im 145/189  lung]
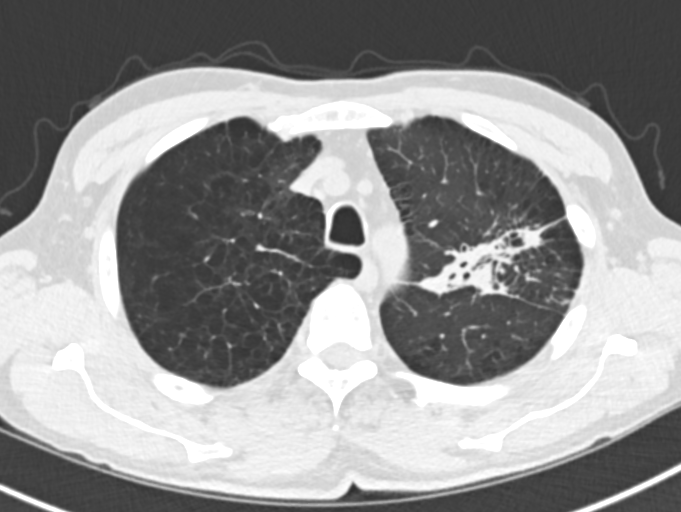
[im 174/189  lung]
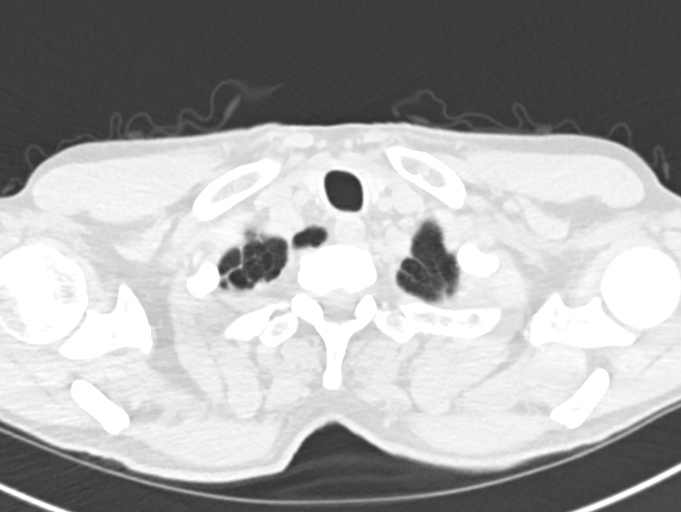

[Series 4: chest 2.00 br40 s3 · coronal · 0.65mm/px · 3 of 125 slices shown (2 of 2)]
[im 25/125  lung]
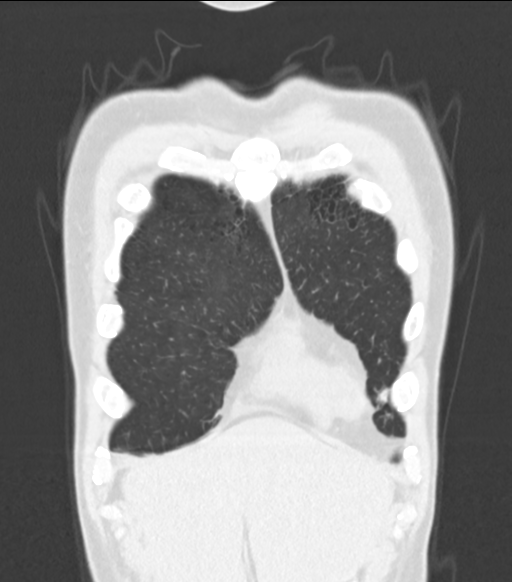
[im 50/125  lung]
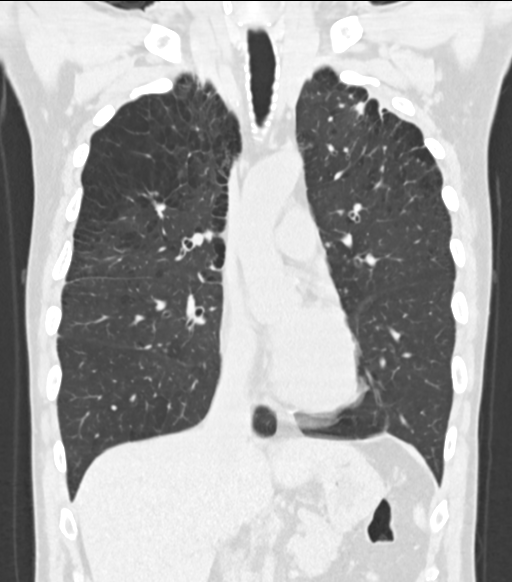
[im 75/125  lung]
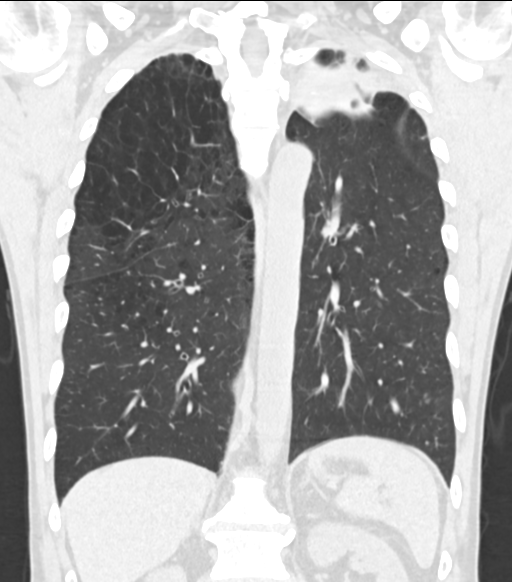

[11 of 36 positions shown; findings below may reference images not displayed]

FINDINGS: Cardiovascular: Mild atherosclerosis of the aorta, great vessels and
coronary arteries. No acute vascular findings on noncontrast
imaging. The heart size is normal. There is no pericardial effusion.

Mediastinum/Nodes: There are no enlarged mediastinal, hilar or
axillary lymph nodes.Hilar assessment is limited by the lack of
intravenous contrast, although the hilar contours appear unchanged.
The thyroid gland, trachea and esophagus demonstrate no significant
findings.

Lungs/Pleura: No pleural effusion or pneumothorax. Moderate to
severe centrilobular and paraseptal emphysema again noted. The large
thick-walled, cavitary left upper lobe lesion measures approximately
7.1 x 4.5 cm on image 38/8 (previously 6.9 x 5.4 cm). There is
increased multifocal nodularity at the left apex superior to this
cavitary lesion which appears inflammatory. There is also an
enlarging nodular component along the inferior aspect of the
cavitary lesion which measures 9 mm on image 47/8. Other scattered
nodules in the lingula are stable. There are stable small nodules in
the left lower lobe, measuring up to 9 mm on image 129/8. Irregular
right lung nodules are stable, including components measuring 16 mm
(image 67/8) and 13 mm (image 83/8), both in the upper lobe. No
enlarging nodule seen in the right lung.

Upper abdomen: The visualized upper abdomen appears stable without
acute findings.

Musculoskeletal/Chest wall: There is no chest wall mass or
suspicious osseous finding.
IMPRESSION: 1. The large thick-walled, cavitary left upper lobe lesion has not
significantly changed in size from the most recent CT and is most
consistent with postinflammatory scarring given relative stability
for several years.
2. There is increased multifocal nodularity at the left apex
superior to this cavitary lesion, which appears inflammatory.
Continued follow-up in 6-12 months suggested.
3. Additional bilateral pulmonary nodules are stable. No adenopathy
or pleural effusion.
4. Aortic Atherosclerosis (B0IJY-3D7.7) and Emphysema (B0IJY-1CD.0).

## 2020-12-08 ENCOUNTER — Encounter: Payer: Medicaid Other | Admitting: Infectious Diseases

## 2020-12-15 IMAGING — DX DG CHEST 2V
2 series · 2 of 2 positions shown · non-contrast
Comparison: Recent chest CT 06/16/2019 and earlier.

CLINICAL DATA: 58-year-old male with history of cavitary lung
disease.

EXAM:
CHEST - 2 VIEW

[chest pa]
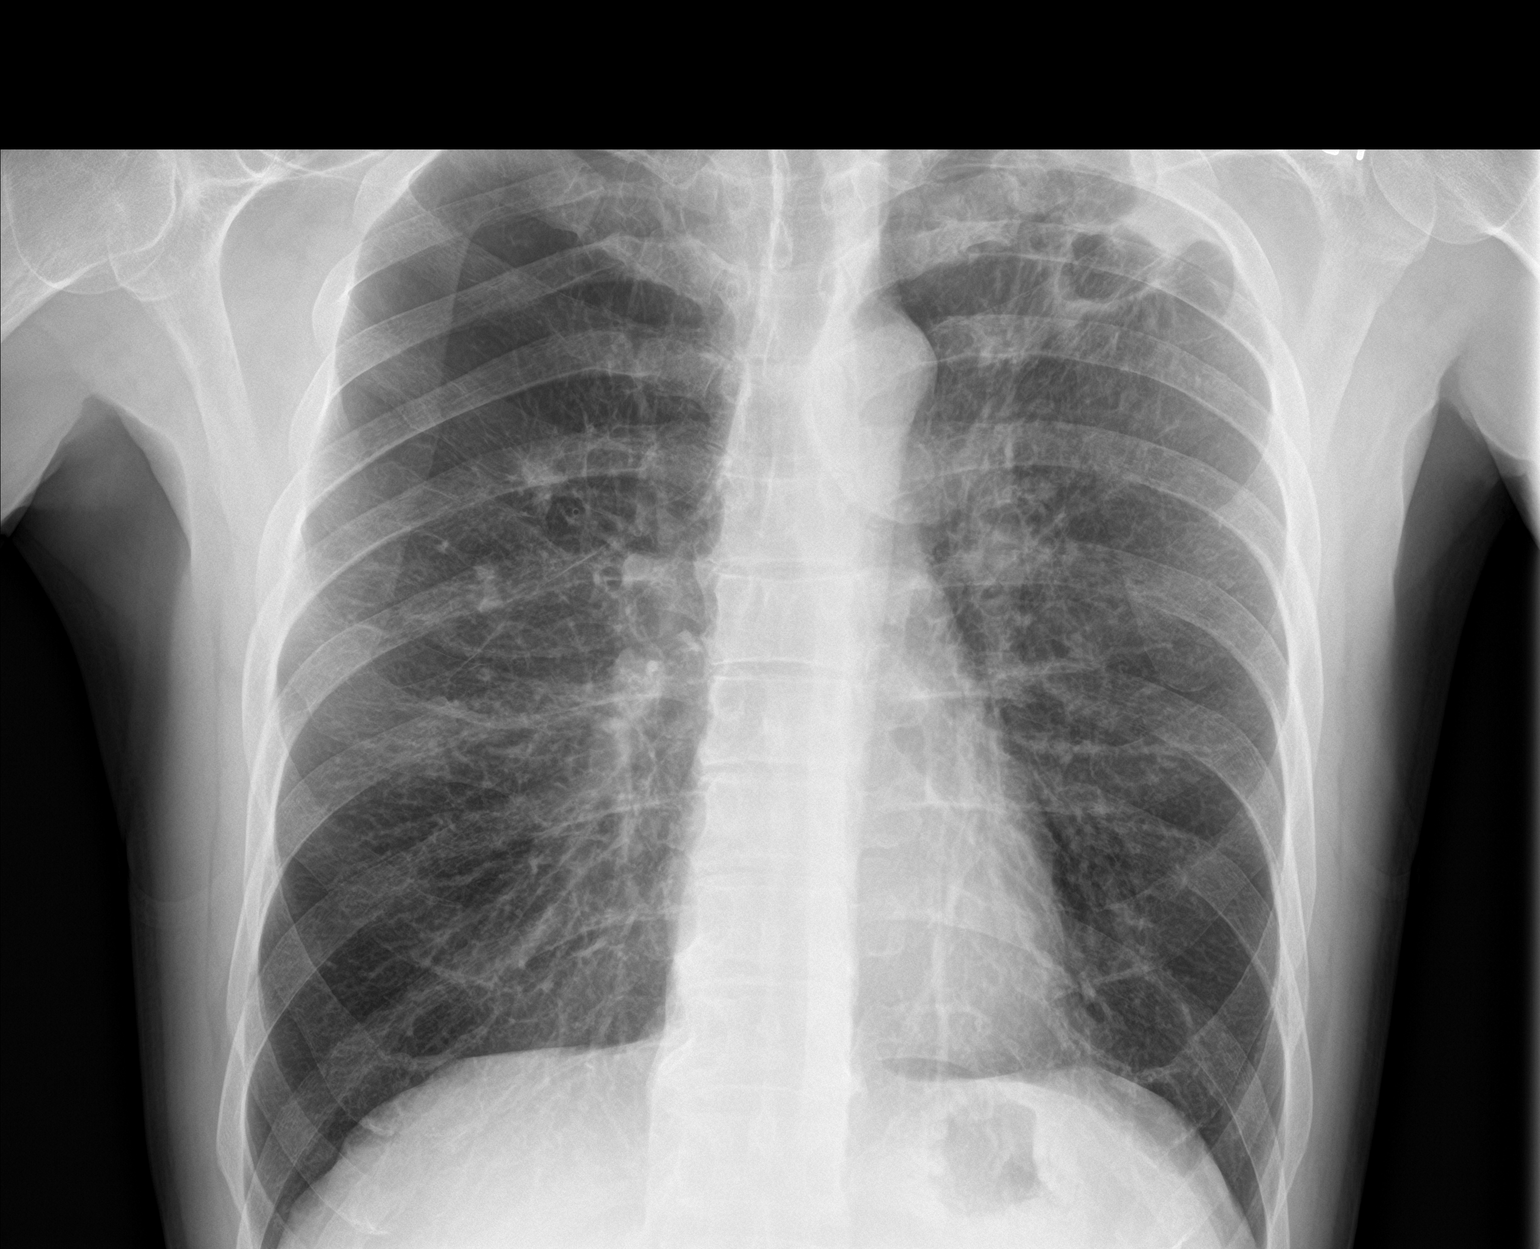

[chest lat]
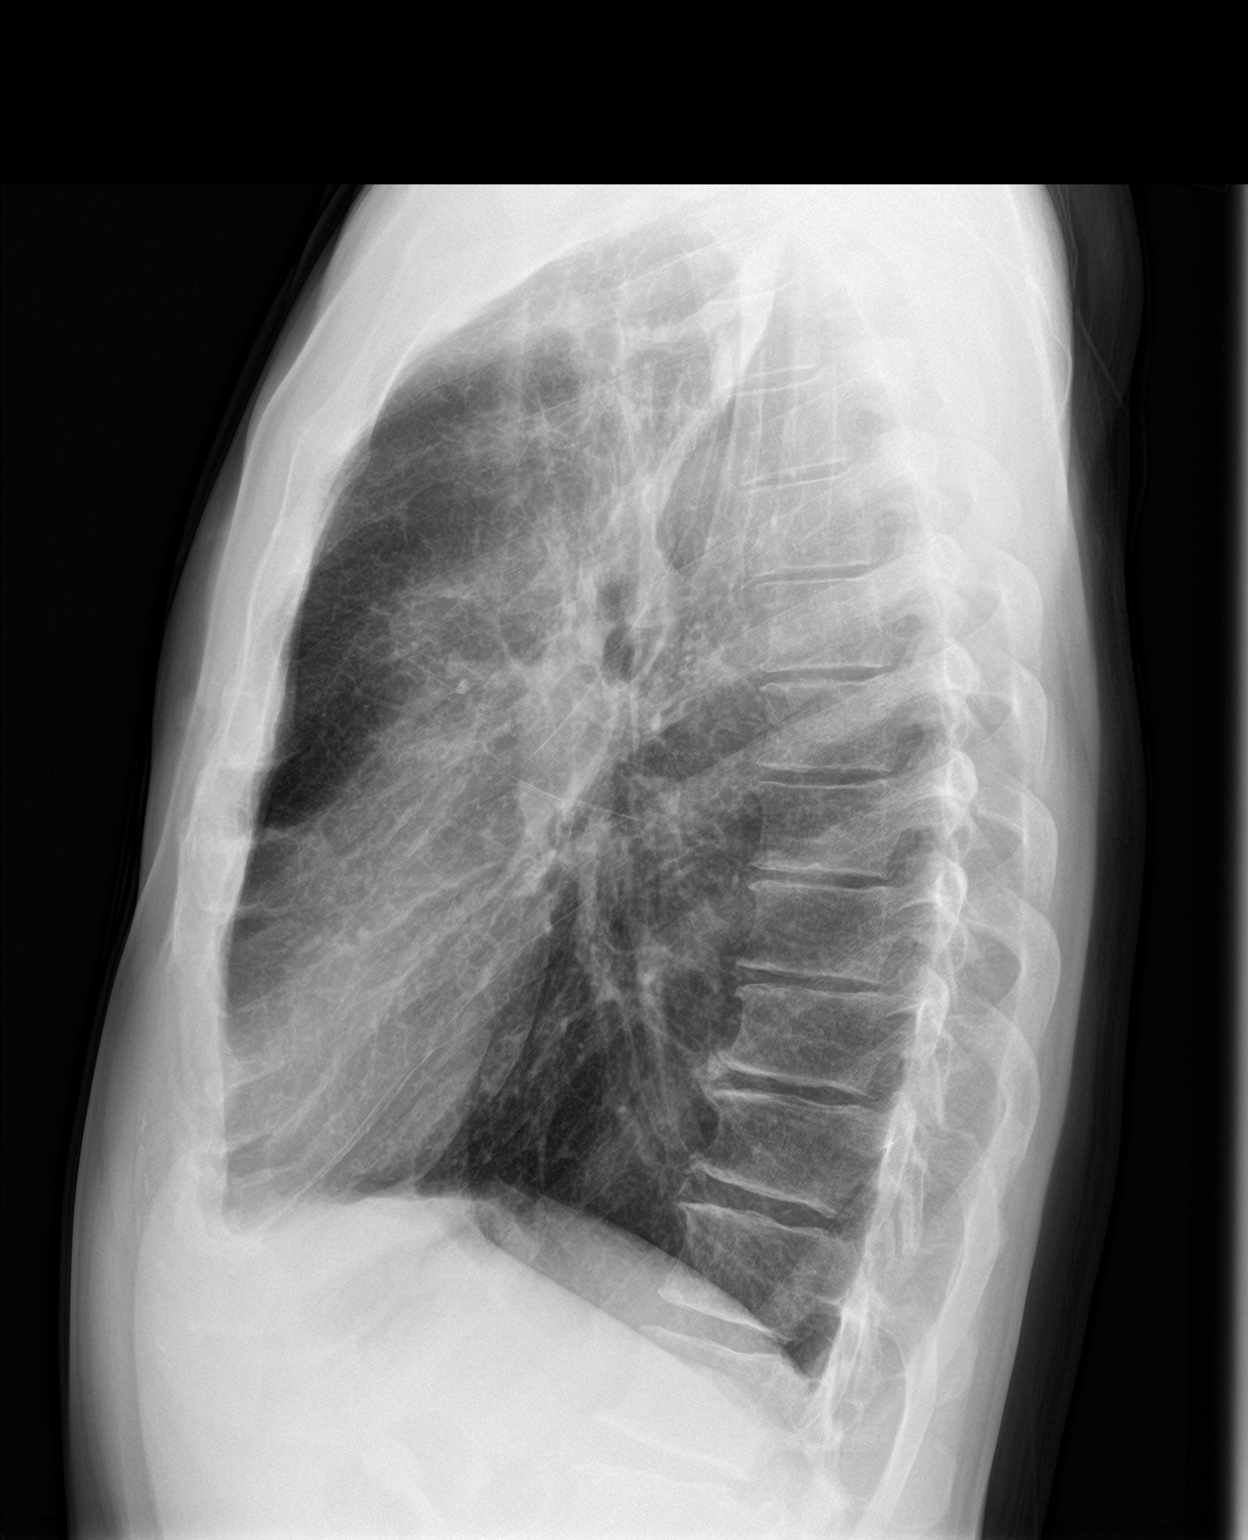

[2 of 2 positions shown; findings below may reference images not displayed]

FINDINGS: Irregular partially thick-walled cavitary lesion in the left upper
lobe has not significantly changed radiographically since Sunday January, 2018. On the other side 2 small partially spiculated nodules near
the right hilum are more apparent radiographically although were
stable by CT. Underlying emphysema. Mediastinal contours remain
normal. No superimposed pneumothorax, pulmonary edema, pleural
effusion, or new opacity. No acute osseous abnormality identified.
Negative visible bowel gas pattern.
IMPRESSION: 1. Cavitary and nodular lung disease superimposed on emphysema,
stable from the recent chest CT 06/16/2019 (please see that report).
2. No new cardiopulmonary abnormality.

## 2020-12-19 ENCOUNTER — Ambulatory Visit (INDEPENDENT_AMBULATORY_CARE_PROVIDER_SITE_OTHER): Payer: Medicaid Other | Admitting: Infectious Diseases

## 2020-12-19 ENCOUNTER — Other Ambulatory Visit: Payer: Self-pay

## 2020-12-19 ENCOUNTER — Encounter: Payer: Self-pay | Admitting: Infectious Diseases

## 2020-12-19 DIAGNOSIS — A318 Other mycobacterial infections: Secondary | ICD-10-CM

## 2020-12-19 DIAGNOSIS — B20 Human immunodeficiency virus [HIV] disease: Secondary | ICD-10-CM

## 2020-12-19 DIAGNOSIS — A310 Pulmonary mycobacterial infection: Secondary | ICD-10-CM

## 2020-12-19 DIAGNOSIS — I10 Essential (primary) hypertension: Secondary | ICD-10-CM

## 2020-12-19 NOTE — Assessment & Plan Note (Signed)
Await his repeat CT Appreciate CVTS f/u Rtc in 1 month Will decide on length at that time.

## 2020-12-19 NOTE — Progress Notes (Signed)
Subjective:    Patient ID: Calvin Schroeder, male  DOB: 04/07/1960, 59 y.o.        MRN: [id number]   HPI 60 yo M previously found to have cystic lesion on chest CT on adm for PNA. 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 He was seen in ID [id number] was found to have AIDS. He was started on E/A/R however developed n/v (that persisted despite zofran)  Restarted MAI rx ~12-2017.    He was started on tivicay-descovey. He has had f/u with pulm for COPD. He has also f/u with CVTS for a cyctic mass in LUL.  His last CVTS f/u was 06-2019- He had repeat CT at that time that showed: 1. The large thick-walled, cavitary left upper lobe lesion has not significantly changed in size from the most recent CT and is most consistent with postinflammatory scarring given relative stability for several years. 2. There is increased multifocal nodularity at the left apex superior to this cavitary lesion, which appears inflammatory. Continued follow-up in 6-12 months suggested. 3. Additional bilateral pulmonary nodules are stable. No adenopathy or pleural effusion. 4. Aortic Atherosclerosis (ICD10-I70.0) and Emphysema (ICD10-J43.9).   He had f/u appt with Pulm 07-09-19 and had repeat Cx showing M abscessus.       Mycobacterium abscessus complex  Amikacin 4.0 ug/mL Susceptible  Cefoxitin Comment  Comment: 32.0 ug/mL Intermediate  Ciprofloxacin >4.0 ug/mL Resistant  Clarithromycin >16.0 ug/mL Resistant  Comment: This organism has been evaluated for inducible macrolide resistance.  Doxycycline 16.0 ug/mL Resistant  Imipenem Comment  Comment: 8.0 ug/mL Intermediate  Linezolid Comment  Comment: 1.0 ug/mL or less, Susceptible  Minocycline >8.0 ug/mL Resistant  Moxifloxacin Comment  Comment: 2.0 ug/mL Intermediate  Tigecycline 0.25 ug/mL  Tobramycin CANCELED  Comment: Test not performed   Result canceled by the ancillary.  Trimethoprim/Sulfa Comment  Comment: >8/152 ug/mL Resistant    He has also been seen by Onc  for KS. He was seen in ID and by pharm in August- his M abscessus therapy was changed to  clofazamine (which he has not been able to tolerate due to n/v), linezolid and omdacycline. He did not take.  September 2021, he was given arikace. He quit taking the clofazamine (diarrhea). He also took zyvox which made him nauseated. He did not start omadacycline, too scared. October 2021- not clear he was taking any of his M abscessus therapy. Reinforced need to take. November 30-21 CT scan- 1. Continued decrease in size of LEFT upper lobe cavitary area compatible with post infectious scarring and cavitation. Diminished nodularity about this area compatible with post infectious or inflammatory changes. 2. Nodules in the RIGHT chest are stable to slightly diminished in size but within 1 mm of previous measurements. Continued attention on follow-up is suggested, stable since December 2020, shown to be cavitary in August of 2020 also likely post infectious but with limited change in size. 3. Emphysema and aortic atherosclerosis.   He has had intermittent adherence to his M abscesses rx (better with the arykace, difficult with timing of meds and meals).     He has had the neck mass removed 3 weeks prior. (12-27-19 [facility]- path- thyroglossal duct cyst).   At his 07-2020 f/u: He denies problems with his tivicay-descovey. his MAI rx was refilled. Arykace, omadacycline, clofazamine. Clofazamine makes him nauseated but he gets zofran from his PCP. He was also started on anti-htn rx by his PCP, has f/u beginning of Aug.    Pending cervical fusion.   12-2020  he has f/u with CVTS, and CT scan this week. Feels like his breathing has been good. States he is still taking his M abscessus rx (since July 2022). Has had booster this fall.  Drinks 2 cocktails/night.   HIV 1 RNA Quant  Date Value  11/21/2020 Not Detected Copies/mL  09/22/2019 <20 Copies/mL  12/10/2018 63 copies/mL (H)   CD4 T Cell Abs (/uL)  Date  Value  11/21/2020 282 (L)  09/22/2019 292 (L)  12/10/2018 256 (L)     Health Maintenance  Topic Date Due   COVID-19 Vaccine (4 - Booster for Moderna series) 10/20/2019   INFLUENZA VACCINE  08/07/2020   Pneumococcal Vaccine 71-58 Years old (3 - PPSV23 if available, else PCV20) 10/25/2024   TETANUS/TDAP  07/02/2028   COLONOSCOPY (Pts 45-53yrs Insurance coverage will need to be confirmed)  01/07/2029   Hepatitis C Screening  Completed   HIV Screening  Completed   Zoster Vaccines- Shingrix  Completed   HPV VACCINES  Aged Out      Review of Systems  Constitutional:  Negative for chills, fever and weight loss.  Respiratory:  Positive for cough. Negative for sputum production and shortness of breath.   Gastrointestinal:  Positive for nausea. Negative for constipation and diarrhea.  Genitourinary:  Negative for dysuria.  Psychiatric/Behavioral:  The patient does not have insomnia.    Please see HPI. All other systems reviewed and negative.     Objective:  Physical Exam Vitals reviewed.  Constitutional:      Appearance: Normal appearance. He is not ill-appearing or diaphoretic.  HENT:     Mouth/Throat:     Mouth: Mucous membranes are moist.     Pharynx: Oropharynx is clear. No oropharyngeal exudate.  Eyes:     Extraocular Movements: Extraocular movements intact.     Pupils: Pupils are equal, round, and reactive to light.  Cardiovascular:     Rate and Rhythm: Normal rate and regular rhythm.  Pulmonary:     Effort: Pulmonary effort is normal.     Breath sounds: Normal breath sounds.  Abdominal:     General: Bowel sounds are normal. There is no distension.     Palpations: Abdomen is soft.     Tenderness: There is no abdominal tenderness.  Musculoskeletal:     Cervical back: Normal range of motion and neck supple.     Right lower leg: No edema.     Left lower leg: No edema.  Skin:    General: Skin is warm.  Neurological:     General: No focal deficit present.      Mental Status: He is alert and oriented to person, place, and time.          Assessment & Plan:

## 2020-12-19 NOTE — Assessment & Plan Note (Signed)
Has recently resumed rx for this.  Will watch.

## 2020-12-19 NOTE — Assessment & Plan Note (Signed)
Doing well on his art His LFTS were up slightly Will repeat at f/u  Offered/refused condoms.  Will see him back next week,  He wants STI testing at that time.  rtc 1 month.

## 2020-12-22 ENCOUNTER — Ambulatory Visit
Admission: RE | Admit: 2020-12-22 | Discharge: 2020-12-22 | Disposition: A | Discharge status: Home / Self Care | Payer: Medicaid Other | Source: Ambulatory Visit | Attending: Thoracic Surgery (Cardiothoracic Vascular Surgery) | Admitting: Thoracic Surgery (Cardiothoracic Vascular Surgery)

## 2020-12-22 DIAGNOSIS — R918 Other nonspecific abnormal finding of lung field: Secondary | ICD-10-CM

## 2020-12-22 IMAGING — CR DG CHEST 2V
3 series · 3 of 3 positions shown · non-contrast
Comparison: Chest CT of 06/16/2019

CLINICAL DATA: Generalized weakness, nausea vomiting and fatigue
for 9 days

EXAM:
CHEST - 2 VIEW

[chest ap (1 of 2)]
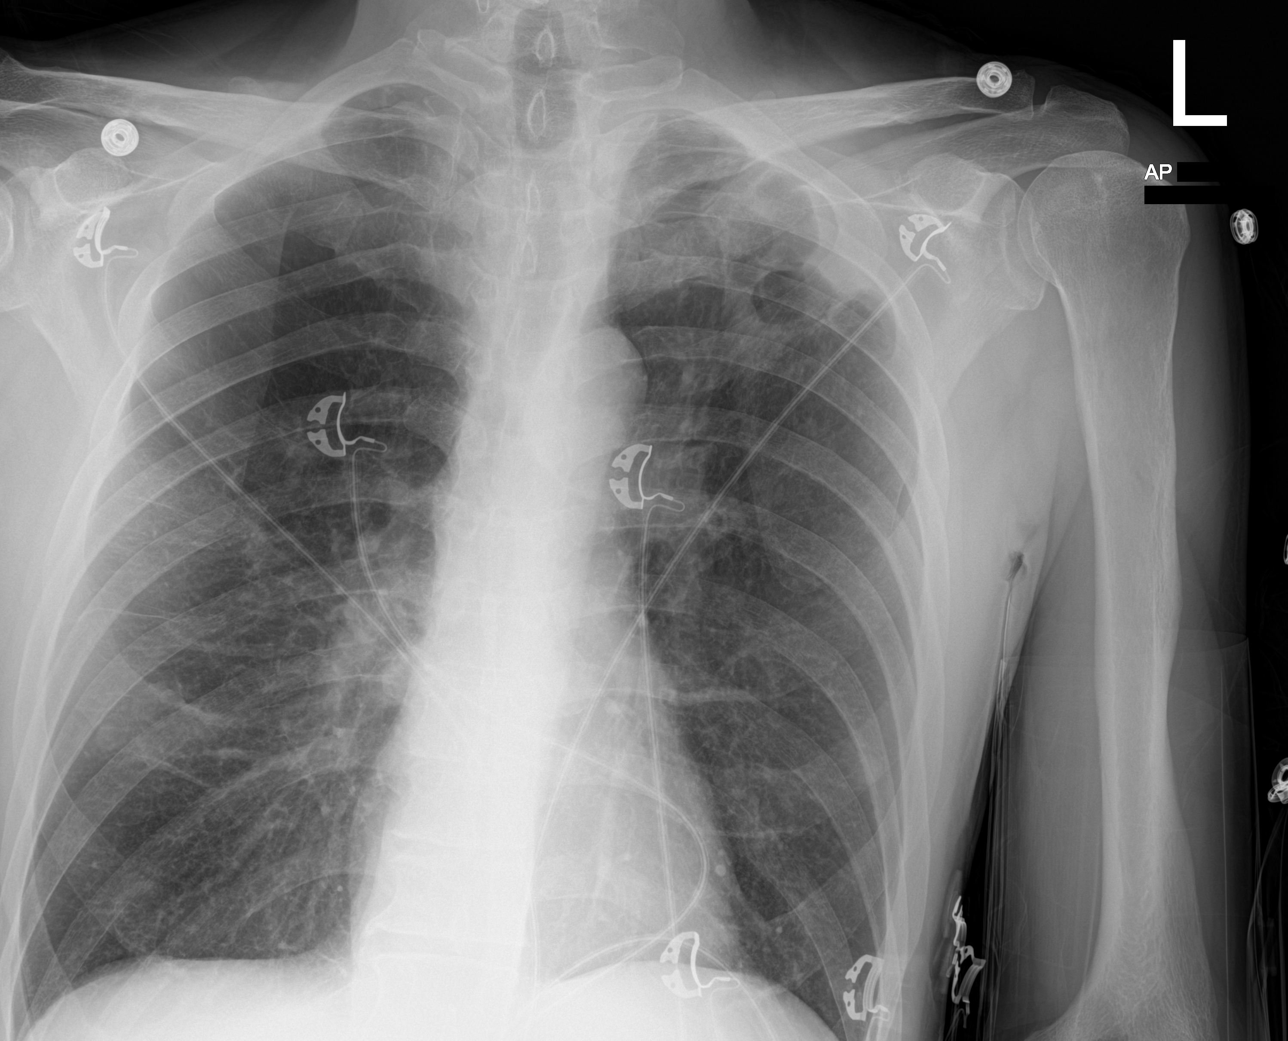

[w chest lat]
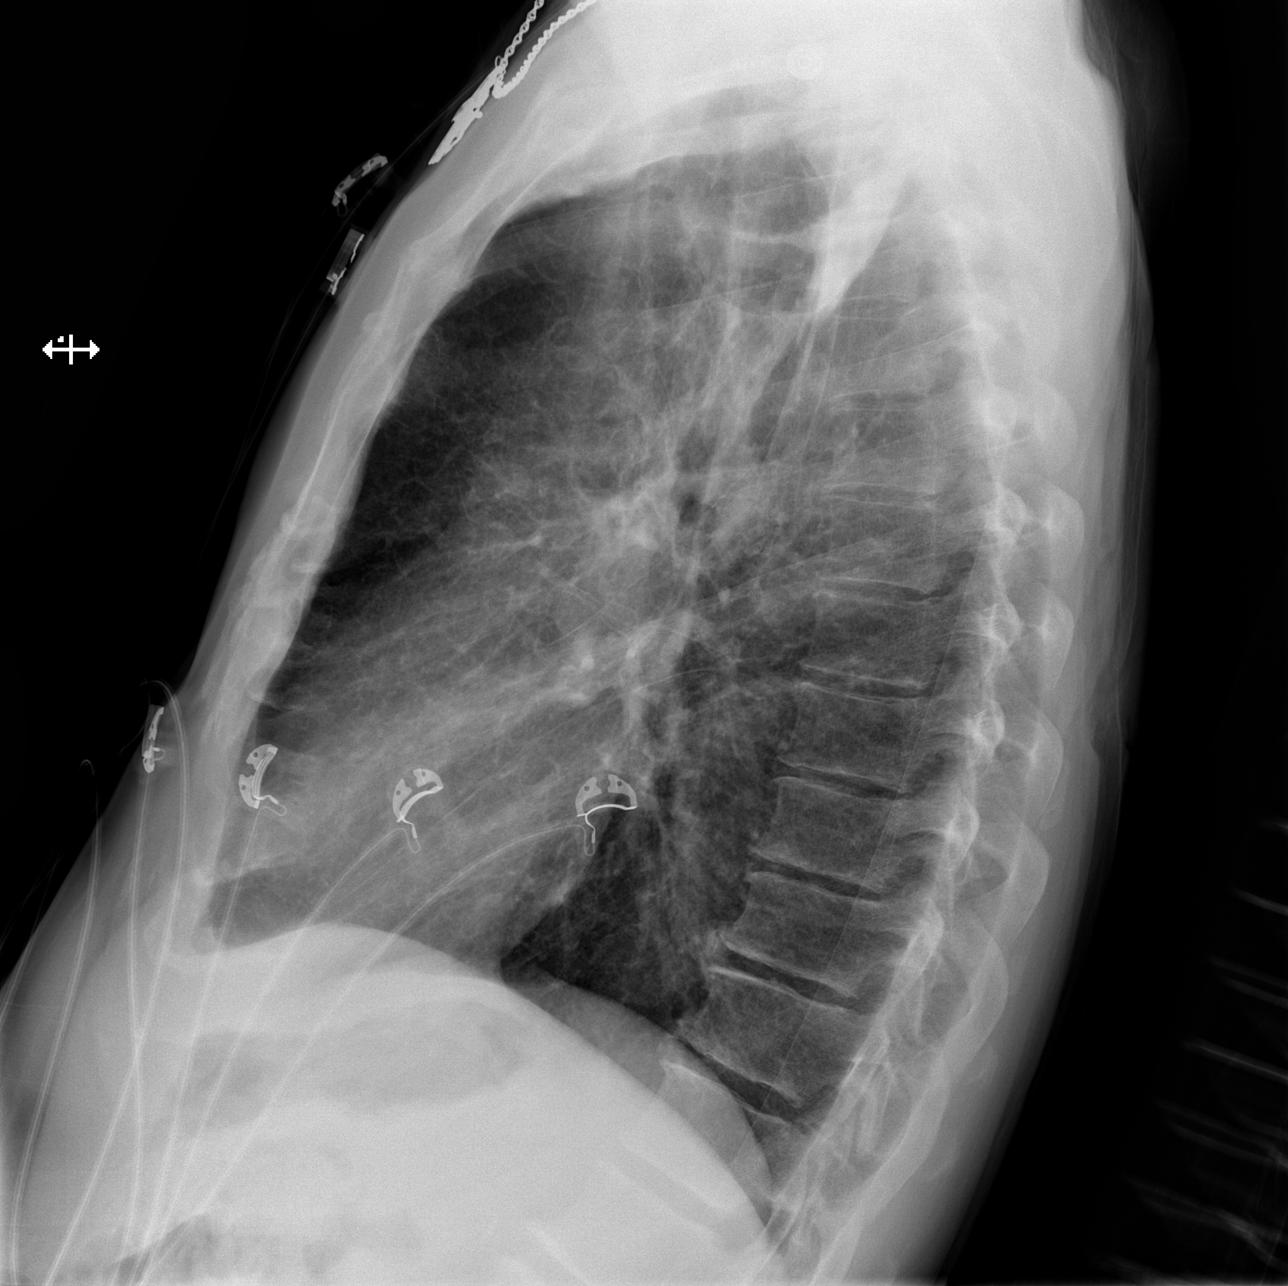

[chest ap (2 of 2)]
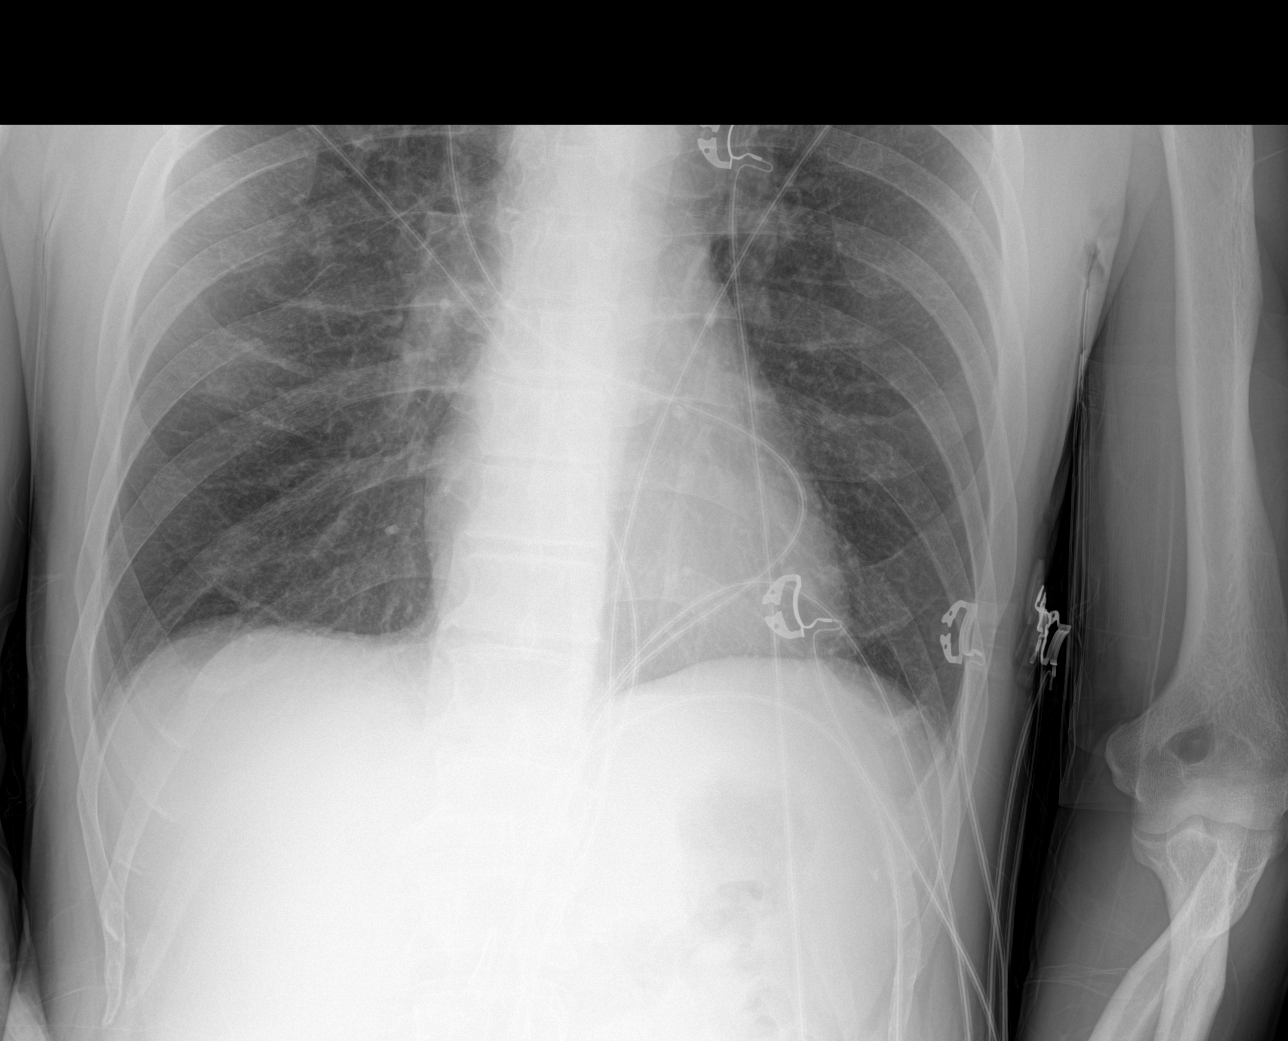

[3 of 3 positions shown; findings below may reference images not displayed]

FINDINGS: Cavitary area in the LEFT upper lobe with added density similar to
previous exams and compatible with known thick-walled cavity in this
location. Multifocal nodularity in the area not as well seen as on
recent CT imaging.

Nodularity in the RIGHT chest also not as well displayed.

Background changes of pulmonary emphysema without evidence of lobar
consolidation or sign of pleural effusion.

Visualized skeletal structures on limited assessment without acute
process.
IMPRESSION: Signs of pulmonary emphysema without acute cardiopulmonary disease.

Cavitary thick-walled area in the LEFT upper chest and nodules
better seen on recent chest CT. No signs of lobar consolidation or
effusion.

## 2020-12-26 ENCOUNTER — Other Ambulatory Visit: Payer: Self-pay

## 2020-12-26 ENCOUNTER — Ambulatory Visit (INDEPENDENT_AMBULATORY_CARE_PROVIDER_SITE_OTHER): Payer: Medicaid Other | Admitting: Thoracic Surgery (Cardiothoracic Vascular Surgery)

## 2020-12-26 ENCOUNTER — Encounter: Payer: Self-pay | Admitting: Thoracic Surgery (Cardiothoracic Vascular Surgery)

## 2020-12-26 VITALS — BP 127/85 | HR 109 | Resp 20 | Ht 70.0 in | Wt 153.0 lb

## 2020-12-26 DIAGNOSIS — J984 Other disorders of lung: Secondary | ICD-10-CM | POA: Diagnosis not present

## 2020-12-26 NOTE — Progress Notes (Signed)
[address]       [place], [id number]             [phone]     HPI: Mr. Calvin Schroeder returns for follow-up of multiple lung lesions.  Calvin Schroeder is a 60 year old man with a history of tobacco abuse, COPD, pulmonary MAI, HIV, Kaposi's sarcoma, and chronic neck and back pain.  He was first found to have a significant abnormality in the lung and the fall 2018.  There was a complex consolidative process in the left upper lobe with cavitation.  Bronchoscopy showed granulomas and cultures grew MAI.  We have continued to follow him since then as some of the nodules were worrisome for possible carcinoma.  He was last seen in our office in June 2022.  His scan was stable.  He was instructed to come back in 6 months.  He saw Dr. Johnnye Schroeder last week.  He has not been having any acute respiratory issues recently.  He continues to smoke a pack of cigarettes daily.  He has not had any fevers, chills, or night sweats.  Denies hemoptysis.  Past Medical History:  Diagnosis Date   Back pain    COPD (chronic obstructive pulmonary disease) (HCC)    emphysema    Dyspnea    with exertion    HIV (human immunodeficiency virus infection) ([place])    Lung mass 06/10/2013   MAI   Pneumonia    11/2016   Tobacco abuse 06/10/2013    Current Outpatient Medications  Medication Sig Dispense Refill   albuterol (VENTOLIN HFA) 108 (90 Base) MCG/ACT inhaler Inhale 2 puffs into the lungs every 6 (six) hours as needed for wheezing or shortness of breath. 8 g 5   Amikacin Sulfate Liposome (ARIKAYCE) 590 MG/8.4ML SUSP Inhale 590 mg into the lungs daily. 235.2 mL 11   benzonatate (TESSALON) 200 MG capsule Take 1 capsule (200 mg total) by mouth every 6 (six) hours as needed for cough. 30 capsule 1   Cholecalciferol 25 MCG (1000 UT) capsule Take 1,000 Units by mouth daily.     DESCOVY 200-25 MG tablet TAKE 1 TABLET BY MOUTH DAILY 30 tablet 1   dolutegravir (TIVICAY) 50 MG tablet TAKE 1 TABLET(50 MG) BY  MOUTH DAILY 30 tablet 0   ipratropium-albuterol (DUONEB) 0.5-2.5 (3) MG/3ML SOLN Take 3 mLs by nebulization every 6 (six) hours as needed (shortness of breath/wheezing). 360 mL 5   linezolid (ZYVOX) 600 MG tablet Take 1 tablet by mouth daily. 30 tablet 5   losartan (COZAAR) 50 MG tablet Take 50 mg by mouth daily.     Misc. Devices KIT Flutter Valve 1 kit 0   Omadacycline Tosylate 150 MG TABS Take 2 tablets by mouth daily. 60 tablet 11   ondansetron (ZOFRAN ODT) 4 MG disintegrating tablet Take 1 tablet (4 mg total) by mouth every 8 (eight) hours as needed for nausea or vomiting. 20 tablet 0   oxyCODONE-acetaminophen (PERCOCET) 10-325 MG tablet Take 1 tablet by mouth every 6 (six) hours as needed.     Tiotropium Bromide-Olodaterol (STIOLTO RESPIMAT) 2.5-2.5 MCG/ACT AERS Inhale 2 puffs into the lungs daily. 4 g 6   No current facility-administered medications for this visit.    Physical Exam BP 127/85 (BP Location: Right Arm, Patient Position: Sitting)    Pulse (!) 109    Resp 20    Ht '5\' 10"'  (1.778 m)    Wt 153 lb (69.4 kg)    SpO2 95%  Comment: RA   BMI 21.45 kg/m  60 year old man in no acute distress Alert and oriented x3 with no focal deficits Lungs diminished breath sounds bilaterally, no wheezing Cardiac regular rate and rhythm No peripheral edema  Diagnostic Tests: CT CHEST WITHOUT CONTRAST   TECHNIQUE: Multidetector CT imaging of the chest was performed following the standard protocol without IV contrast.   COMPARISON:  Comparison is made with June 29, 2020.   FINDINGS: Cardiovascular: Calcified atheromatous plaque of the thoracic aorta tracks into the abdominal aorta. Calcifications are mild. No aortic dilation. Normal heart size. Scattered coronary artery calcifications. Normal caliber of the central pulmonary vessels.   Mediastinum/Nodes: No adenopathy in the chest. Esophagus is unremarkable grossly by CT.   Lungs/Pleura: Area with cavitation in the LEFT upper lobe  with bandlike changes that extend both the pleural surface and the LEFT suprahilar region, associated with retraction of the major fissure anteriorly measure approximately 3.4 x 6.5 cm, within 1 mm or 2 in greatest axial dimension when measured in a similar fashion on the prior study. Also similar when compared to the study of December 07, 2019.   Associated nodules in the LEFT upper lobe with similar appearance. Grouped nodules along the major fissure in the LEFT chest more centrally (image 89/8 and image 84/8 and images in between the show no substantial change from prior imaging largest approximately 3 mm to 4 mm greatest size. Lingular micro nodularity with similar pattern and appearance.   No change in LEFT lower lobe nodularity.   Spiculated appearing nodule in the RIGHT upper lobe (image 78/8) 2.0 x 1.5 cm within 1 mm of previous measurement with branching appearance extending cephalad from an area of fissural retraction. RIGHT upper lobe area of branching nodularity maximal dimension approximately 1.7 cm similarly stable as far back as December 07, 2019. (Image 64/8) other small areas of nodularity in the RIGHT upper lobe without change.   Stable Peri fissural RIGHT lower lobe pulmonary nodule.   Severe pulmonary emphysema with paraseptal in centrilobular emphysematous changes. Airways are patent.   Upper Abdomen: Periportal focal fat. No acute findings relative to the liver with mild background hepatic steatosis. No pericholecystic stranding or stranding about the pancreas or spleen. The adrenal glands are normal. No upper abdominal lymphadenopathy.   Musculoskeletal: No acute bone finding. No destructive bone process. Spinal degenerative changes.   IMPRESSION: Severe pulmonary emphysema with paraseptal in centrilobular emphysematous changes.   Area with cavitation in the LEFT upper lobe with bandlike changes that extend both the pleural surface and the LEFT  suprahilar region, associated with retraction of the major fissure anteriorly. Also similar when compared to the study of December 07, 2019, diminished in size compared to more remote imaging.   Additional areas of nodularity throughout the chest which are also similar to previous imaging with scattered areas of grouped nodularity throughout the chest without substantial change. Findings are compatible with chronic infection. Continued surveillance may be warranted given the spiculated nature of some of these areas particularly in the RIGHT upper lobe. Consider lung cancer screening if appropriate or 12 month follow-up as warranted.   Aortic atherosclerosis.   Aortic Atherosclerosis (ICD10-I70.0).     Electronically Signed   By: Zetta Bills M.D.   On: 12/22/2020 14:38 I personally reviewed the CT images.  There were multiple abnormalities including a large cavitary lesion in the left upper lobe that is stable from his most recent scan and decreased compared to prior scans.  He does have  additional lung nodules all of which remains stable.  No new findings on today's scan.  He does have extensive emphysema.  Impression: Calvin Schroeder is a 60 year old man with a history of tobacco abuse, COPD, pulmonary MAI, HIV, Kaposi's sarcoma, and chronic neck and back pain.  He has been followed for cavitary lung lesion in the left upper lobe and multiple other lung nodules dating back to 2018.  Cavitary lung mass/lung nodules-stable.  No new nodules or progression in the last 6 months.  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  Being treated by Dr. Johnnye Schroeder.  We will plan to scan him again in a year as he would be due for a low-dose screening CT scan at that time anyway.  Tobacco abuse-still smoking a pack a day.  I again emphasized the importance of tobacco cessation.  He says he is thinking about to quit on New Year's.  COPD-stable   Plan: Return in 1 year with CT chest  [name], MD [facility] [phone]

## 2021-01-04 ENCOUNTER — Other Ambulatory Visit: Payer: Self-pay | Admitting: Infectious Diseases

## 2021-01-04 ENCOUNTER — Other Ambulatory Visit: Payer: Self-pay

## 2021-01-04 DIAGNOSIS — B20 Human immunodeficiency virus [HIV] disease: Secondary | ICD-10-CM

## 2021-01-04 MED ORDER — DESCOVY 200-25 MG PO TABS: 1.0000 | ORAL_TABLET | Freq: Every day | ORAL | 5 refills | Status: DC

## 2021-01-04 MED ORDER — TIVICAY 50 MG PO TABS: ORAL_TABLET | ORAL | 5 refills | Status: DC

## 2021-01-18 ENCOUNTER — Ambulatory Visit: Payer: Medicaid Other | Admitting: Infectious Diseases

## 2021-01-18 ENCOUNTER — Other Ambulatory Visit: Payer: Self-pay

## 2021-01-18 ENCOUNTER — Encounter: Payer: Self-pay | Admitting: Infectious Diseases

## 2021-01-18 ENCOUNTER — Other Ambulatory Visit (HOSPITAL_COMMUNITY)
Admission: RE | Admit: 2021-01-18 | Discharge: 2021-01-18 | Disposition: A | Discharge status: Home / Self Care | Payer: Medicaid Other | Source: Ambulatory Visit | Attending: Infectious Diseases | Admitting: Infectious Diseases

## 2021-01-18 ENCOUNTER — Telehealth: Payer: Self-pay

## 2021-01-18 VITALS — BP 114/80 | HR 102 | Temp 97.9°F | Wt 153.0 lb

## 2021-01-18 DIAGNOSIS — B20 Human immunodeficiency virus [HIV] disease: Secondary | ICD-10-CM | POA: Diagnosis not present

## 2021-01-18 DIAGNOSIS — C469 Kaposi's sarcoma, unspecified: Secondary | ICD-10-CM

## 2021-01-18 DIAGNOSIS — Z79899 Other long term (current) drug therapy: Secondary | ICD-10-CM | POA: Diagnosis not present

## 2021-01-18 DIAGNOSIS — Z720 Tobacco use: Secondary | ICD-10-CM | POA: Diagnosis not present

## 2021-01-18 DIAGNOSIS — J439 Emphysema, unspecified: Secondary | ICD-10-CM | POA: Diagnosis not present

## 2021-01-18 DIAGNOSIS — Z113 Encounter for screening for infections with a predominantly sexual mode of transmission: Secondary | ICD-10-CM | POA: Diagnosis present

## 2021-01-18 DIAGNOSIS — A310 Pulmonary mycobacterial infection: Secondary | ICD-10-CM | POA: Diagnosis not present

## 2021-01-18 NOTE — Assessment & Plan Note (Signed)
has one on his L hip nearly resolved, one on his R meidal- brachial area, also nearly resolved.

## 2021-01-18 NOTE — Assessment & Plan Note (Signed)
He is doing well Continue descovey-dtgv.  Refuses condoms Will check his labs at f/u Attests he has had he vax.

## 2021-01-18 NOTE — Progress Notes (Signed)
Subjective:    Patient ID: Calvin Schroeder, male  DOB: 26-Sep-1960, 61 y.o.        MRN: [id number]   HPI 61yo M previously found to have cystic lesion on chest CT on adm for PNA. 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 He was seen in ID 03-2017 was found to have AIDS. He was started on E/A/R however developed n/v (that persisted despite zofran)  Restarted MAI rx ~12-2017.    He was started on tivicay-descovey. He has had f/u with pulm for COPD. He has also f/u with CVTS for a cyctic mass in LUL.  His last CVTS f/u was 06-2019- He had repeat CT at that time that showed: 1. The large thick-walled, cavitary left upper lobe lesion has not significantly changed in size from the most recent CT and is most consistent with postinflammatory scarring given relative stability for several years. 2. There is increased multifocal nodularity at the left apex superior to this cavitary lesion, which appears inflammatory. Continued follow-up in 6-12 months suggested. 3. Additional bilateral pulmonary nodules are stable. No adenopathy or pleural effusion. 4. Aortic Atherosclerosis (ICD10-I70.0) and Emphysema (ICD10-J43.9).   He had f/u appt with Pulm 07-09-19 and had repeat Cx showing M abscessus.       Mycobacterium abscessus complex  Amikacin 4.0 ug/mL Susceptible  Cefoxitin Comment  Comment: 32.0 ug/mL Intermediate  Ciprofloxacin >4.0 ug/mL Resistant  Clarithromycin >16.0 ug/mL Resistant  Comment: This organism has been evaluated for inducible macrolide resistance.  Doxycycline 16.0 ug/mL Resistant  Imipenem Comment  Comment: 8.0 ug/mL Intermediate  Linezolid Comment  Comment: 1.0 ug/mL or less, Susceptible  Minocycline >8.0 ug/mL Resistant  Moxifloxacin Comment  Comment: 2.0 ug/mL Intermediate  Tigecycline 0.25 ug/mL  Tobramycin CANCELED  Comment: Test not performed   Result canceled by the ancillary.  Trimethoprim/Sulfa Comment  Comment: >8/152 ug/mL Resistant    He has also been seen by Onc for  KS.  He was seen in ID and by pharm in August- his M abscessus therapy was changed to  clofazamine (which he has not been able to tolerate due to n/v), linezolid and omdacycline. He did not take.  September 2021, he was given arikace. He quit taking the clofazamine (diarrhea). He also took zyvox which made him nauseated. He did not start omadacycline, too scared. October 2021- not clear he was taking any of his M abscessus therapy. Reinforced need to take. November 30-21 CT scan- 1. Continued decrease in size of LEFT upper lobe cavitary area compatible with post infectious scarring and cavitation. Diminished nodularity about this area compatible with post infectious or inflammatory changes. 2. Nodules in the RIGHT chest are stable to slightly diminished in size but within 1 mm of previous measurements. Continued attention on follow-up is suggested, stable since December 2020, shown to be cavitary in August of 2020 also likely post infectious but with limited change in size. 3. Emphysema and aortic atherosclerosis.   He has had intermittent adherence to his M abscesses rx (better with the arykace, difficult with timing of meds and meals).     He has had the neck mass removed 3 weeks prior. (12-27-19 [facility]- path- thyroglossal duct cyst).   At his 07-2020 f/u: He denies problems with his tivicay-descovey. his MAI rx was refilled. Arykace, omadacycline, clofazamine. Clofazamine makes him nauseated but he gets zofran from his PCP. He was also started on anti-htn rx by his PCP, has f/u beginning of Aug.    Pending cervical fusion.  12-2020 he has f/u with CVTS, and  repeat CT scan following.  He is taking his M abscessus rx (since July 2022) (but he can't name them.  Severe pulmonary emphysema with paraseptal in centrilobular emphysematous changes.   Area with cavitation in the LEFT upper lobe with bandlike changes that extend both the pleural surface and the LEFT suprahilar  region, associated with retraction of the major fissure anteriorly. Also similar when compared to the study of December 07, 2019, diminished in size compared to more remote imaging.   Additional areas of nodularity throughout the chest which are also similar to previous imaging with scattered areas of grouped nodularity throughout the chest without substantial change. Findings are compatible with chronic infection. Continued surveillance may be warranted given the spiculated nature of some of these areas particularly in the RIGHT upper lobe. Consider lung cancer screening if appropriate or 12 month follow-up as warranted.  He would review of his CT and STI testing.  Has CPAP mask but hasn't used in > 2 yrs.   No problems with DTGV/descovey.  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 Denies MSM activity.    HIV 1 RNA Quant  Date Value  11/21/2020 Not Detected Copies/mL  09/22/2019 <20 Copies/mL  12/10/2018 63 copies/mL (H)   CD4 T Cell Abs (/uL)  Date Value  11/21/2020 282 (L)  09/22/2019 292 (L)  12/10/2018 256 (L)     Health Maintenance  Topic Date Due   INFLUENZA VACCINE  08/07/2020   TETANUS/TDAP  07/02/2028   COLONOSCOPY (Pts 45-2yrs Insurance coverage will need to be confirmed)  01/07/2029   Pneumococcal Vaccine 21-37 Years old  Completed   COVID-19 Vaccine  Completed   Hepatitis C Screening  Completed   HIV Screening  Completed   Zoster Vaccines- Shingrix  Completed   HPV VACCINES  Aged Out      Review of Systems  Constitutional:  Negative for chills, fever and weight loss.  Respiratory:  Positive for cough (rare now.). Negative for hemoptysis, sputum production and shortness of breath.   Cardiovascular:  Negative for chest pain (had 3 months ago. L > R. felt like it was in his lungs/chest.).  Gastrointestinal:  Negative for constipation and diarrhea (4-5x /day. not solid).  Genitourinary:  Negative for dysuria and hematuria.  Psychiatric/Behavioral:  The patient does  not have insomnia.    Please see HPI. All other systems reviewed and negative.     Objective:  Physical Exam Vitals reviewed.  Constitutional:      Appearance: Normal appearance.  HENT:     Mouth/Throat:     Mouth: Mucous membranes are moist.  Eyes:     Extraocular Movements: Extraocular movements intact.     Pupils: Pupils are equal, round, and reactive to light.  Cardiovascular:     Rate and Rhythm: Normal rate and regular rhythm.  Pulmonary:     Effort: Pulmonary effort is normal.     Breath sounds: Wheezing and rhonchi present.  Abdominal:     General: Bowel sounds are normal. There is no distension.     Palpations: Abdomen is soft.     Tenderness: There is no abdominal tenderness.  Musculoskeletal:     Cervical back: Normal range of motion and neck supple.     Right lower leg: No edema.     Left lower leg: No edema.  Neurological:     General: No focal deficit present.     Mental Status: He is alert.  Assessment & Plan:

## 2021-01-18 NOTE — Assessment & Plan Note (Signed)
Encouraged to quit. 

## 2021-01-18 NOTE — Assessment & Plan Note (Signed)
Has his inhalers.  Will set up pulm f/u.

## 2021-01-18 NOTE — Telephone Encounter (Signed)
Appointment was scheduled for patient to see Dr. Erin Fulling with pulmonology on 2/13 at 9:45am. Called patient to relay appointment info, no answer. Left HIPAA compliant voicemail requesting callback.   Beryle Flock, RN

## 2021-01-18 NOTE — Assessment & Plan Note (Signed)
Will continue his current rx Will see him back in 5 months to f/u.

## 2021-01-19 LAB — URINE CYTOLOGY ANCILLARY ONLY
Chlamydia: NEGATIVE
Comment: NEGATIVE
Comment: NORMAL
Neisseria Gonorrhea: NEGATIVE

## 2021-01-19 LAB — RPR: RPR Ser Ql: NONREACTIVE

## 2021-01-30 NOTE — Telephone Encounter (Signed)
Patient with an identified voice messaging system. Left patient a voicemail with appointment information and advised to review Mychart for appointment details and contact information. Calvin Schroeder

## 2021-02-19 ENCOUNTER — Ambulatory Visit: Payer: Medicaid Other | Admitting: Pulmonary Disease

## 2021-02-19 NOTE — Progress Notes (Unsigned)
Synopsis: Referred in 2019 for cavitary lung lesion, COPD by Sue Lush, PA-C. Previously a patient of Drs. McQuaid, [facility] and [facility].  Subjective:   PATIENT ID: Calvin Schroeder GENDER: male DOB: 05-17-1960, MRN: [id number]   HPI  No chief complaint on file.  Calvin Schroeder is a 61 year old male, daily smoker with a history of HIV, cavitary pulmonary MAC and COPD who returns to pulmonary clinic for follow up.   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 He denies any respiratory issues at this time. He has persistent cough that is at baseline.   He is following with infectious disease for his HIV and the MAC infection. He remains on linezolid, omadacycline and amikacin with no side effects for treatment of his MAC.   He is also following with CT Surgery regarding the cavitary lesions related to the MAC infection and they have been monitoring him with CT Chest scans.  He reports he is being scheduled for cervical spine surgery in the coming weeks.   Past Medical History:  Diagnosis Date   Back pain    COPD (chronic obstructive pulmonary disease) (HCC)    emphysema    Dyspnea    with exertion    HIV (human immunodeficiency virus infection) ([facility])    Lung mass 06/10/2013   MAI   Pneumonia    11/2016   Tobacco abuse 06/10/2013     Family History  Problem Relation Age of Onset   Cancer Mother        lung ca   Cancer Father        skin ca   Cancer Maternal Uncle        liver ca   Cancer Maternal Grandmother        bladder ca     Social History   Socioeconomic History   Marital status: Single    Spouse name: Not on file   Number of children: Not on file   Years of education: Not on file   Highest education level: Not on file  Occupational History   Not on file  Tobacco Use   Smoking status: Every Day    Packs/day: 1.00    Years: 40.00    Pack years: 40.00    Types: Cigarettes   Smokeless tobacco: Never   Tobacco  comments:    1/2 ppd as of 09/10/19  Vaping Use   Vaping Use: Never used  Substance and Sexual Activity   Alcohol use: Yes    Comment: daily - 3-4 alcohol   Drug use: Not Currently    Types: Marijuana    Comment: daily    Sexual activity: Not Currently    Partners: Female    Comment: refused  Other Topics Concern   Not on file  Social History Narrative   Not on file   Social Determinants of Health   Financial Resource Strain: Not on file  Food Insecurity: Not on file  Transportation Needs: Not on file  Physical Activity: Not on file  Stress: Not on file  Social Connections: Not on file  Intimate Partner Violence: Not on file     Allergies  Allergen Reactions   Rifampin     Reports he could not function for 4 days  Other reaction(s): Other Reports he could not function for 4 days      Outpatient Medications Prior to Visit  Medication Sig Dispense Refill   albuterol (VENTOLIN HFA) 108 (90  Base) MCG/ACT inhaler Inhale 2 puffs into the lungs every 6 (six) hours as needed for wheezing or shortness of breath. 8 g 5   Amikacin Sulfate Liposome (ARIKAYCE) 590 MG/8.4ML SUSP Inhale 590 mg into the lungs daily. 235.2 mL 11   benzonatate (TESSALON) 200 MG capsule Take 1 capsule (200 mg total) by mouth every 6 (six) hours as needed for cough. 30 capsule 1   Cholecalciferol 25 MCG (1000 UT) capsule Take 1,000 Units by mouth daily.     dolutegravir (TIVICAY) 50 MG tablet TAKE 1 TABLET(50 MG) BY MOUTH DAILY 30 tablet 5   emtricitabine-tenofovir AF (DESCOVY) 200-25 MG tablet Take 1 tablet by mouth daily. 30 tablet 5   ipratropium-albuterol (DUONEB) 0.5-2.5 (3) MG/3ML SOLN Take 3 mLs by nebulization every 6 (six) hours as needed (shortness of breath/wheezing). 360 mL 5   linezolid (ZYVOX) 600 MG tablet Take 1 tablet by mouth daily. 30 tablet 5   losartan (COZAAR) 50 MG tablet Take 50 mg by mouth daily.     Misc. Devices KIT Flutter Valve 1 kit 0   Omadacycline Tosylate 150 MG TABS Take 2  tablets by mouth daily. 60 tablet 11   ondansetron (ZOFRAN ODT) 4 MG disintegrating tablet Take 1 tablet (4 mg total) by mouth every 8 (eight) hours as needed for nausea or vomiting. 20 tablet 0   oxyCODONE-acetaminophen (PERCOCET) 10-325 MG tablet Take 1 tablet by mouth every 6 (six) hours as needed.     Tiotropium Bromide-Olodaterol (STIOLTO RESPIMAT) 2.5-2.5 MCG/ACT AERS Inhale 2 puffs into the lungs daily. 4 g 6   No facility-administered medications prior to visit.    Review of Systems  Constitutional:  Negative for chills, diaphoresis, fever, malaise/fatigue and weight loss.  HENT:  Negative for congestion, sinus pain and sore throat.   Eyes: Negative.   Respiratory:  Positive for cough. Negative for hemoptysis, sputum production, shortness of breath and wheezing.   Cardiovascular:  Negative for chest pain, palpitations, orthopnea and leg swelling.  Gastrointestinal:  Negative for abdominal pain, heartburn, nausea and vomiting.  Genitourinary: Negative.   Musculoskeletal: Negative.   Skin:  Negative for rash.  Neurological: Negative.   Endo/Heme/Allergies: Negative.   Psychiatric/Behavioral: Negative.    Objective:   There were no vitals filed for this visit.   Physical Exam Constitutional:      General: He is not in acute distress.    Appearance: Normal appearance. He is normal weight.  HENT:     Head: Normocephalic and atraumatic.  Eyes:     General: No scleral icterus.    Conjunctiva/sclera: Conjunctivae normal.     Pupils: Pupils are equal, round, and reactive to light.  Cardiovascular:     Rate and Rhythm: Normal rate.     Pulses: Normal pulses.     Heart sounds: Normal heart sounds.  Pulmonary:     Effort: Pulmonary effort is normal.     Breath sounds: Normal breath sounds.  Abdominal:     General: Bowel sounds are normal.     Palpations: Abdomen is soft.  Musculoskeletal:     Right lower leg: No edema.     Left lower leg: No edema.  Neurological:      General: No focal deficit present.     Mental Status: He is alert.  Psychiatric:        Mood and Affect: Mood normal.        Behavior: Behavior normal.        Thought Content: Thought content  normal.        Judgment: Judgment normal.    CBC    Component Value Date/Time   WBC 5.1 11/21/2020 1025   RBC 4.65 11/21/2020 1025   HGB 17.5 (H) 11/21/2020 1025   HGB 14.3 12/10/2016 1042   HCT 50.1 (H) 11/21/2020 1025   HCT 42.1 12/10/2016 1042   PLT 157 11/21/2020 1025   PLT 121 (L) 12/10/2016 1042   MCV 107.7 (H) 11/21/2020 1025   MCV 98.9 (H) 12/10/2016 1042   MCH 37.6 (H) 11/21/2020 1025   MCHC 34.9 11/21/2020 1025   RDW 13.4 11/21/2020 1025   RDW 12.6 12/10/2016 1042   LYMPHSABS 0.7 07/02/2019 0811   LYMPHSABS 0.7 (L) 12/10/2016 1042   MONOABS 0.7 07/02/2019 0811   MONOABS 0.4 12/10/2016 1042   EOSABS 0.0 07/02/2019 0811   EOSABS 0.1 12/10/2016 1042   BASOSABS 0.0 07/02/2019 0811   BASOSABS 0.0 12/10/2016 1042   BMP Latest Ref Rng & Units 11/21/2020 09/22/2019 07/02/2019  Glucose 65 - 99 mg/dL 95 95 110(H)  BUN 7 - 25 mg/dL 6(L) 10 10  Creatinine 0.70 - 1.30 mg/dL 0.63(L) 0.86 0.83  BUN/Creat Ratio 6 - 22 (calc) 10 NOT APPLICABLE -  Sodium 703 - 146 mmol/L 137 139 129(L)  Potassium 3.5 - 5.3 mmol/L 3.5 4.8 3.9  Chloride 98 - 110 mmol/L 98 102 91(L)  CO2 20 - 32 mmol/L _0 Calcium 8.6 - 10.3 mg/dL 8.9 9.0 8.5(L)   Chest imaging: CT Chest 12/07/19 1. Continued decrease in size of LEFT upper lobe cavitary area compatible with post infectious scarring and cavitation. Diminished nodularity about this area compatible with post infectious or inflammatory changes. 2. Nodules in the RIGHT chest are stable to slightly diminished in size but within 1 mm of previous measurements. Continued attention on follow-up is suggested, stable since December 2020, shown to be cavitary in August of 2020 also likely post infectious but with limited change in size. 3. Emphysema and  aortic atherosclerosis.  PFT: PFT Results Latest Ref Rng & Units 06/11/2017  FVC-Pre L 3.63  FVC-Predicted Pre % 73  Pre FEV1/FVC % % 65  FEV1-Pre L 2.34  FEV1-Predicted Pre % 61     Assessment & Plan:   No diagnosis found.  Discussion: Calvin Schroeder is a 61 year old male, daily smoker with a history of HIV, cavitary pulmonary MAC and COPD who returns to pulmonary clinic for follow up.   He is doing well from a COPD standpoint and will continue on stiolto respimat daily along with as needed albuterol.   He is following with ID for the MAC infection and is to continue his regimen of arykace, omadacycline and zyvox.  His cavitary lesions are improving since treatment began for the MAC infection. The other pulmonary nodules remain stable. CT Surgery is following and have been ordering surveillance chest scans.   Follow up in 6 months  Freda Jackson, MD [facility] Office: [phone]    Current Outpatient Medications:    albuterol (VENTOLIN HFA) 108 (90 Base) MCG/ACT inhaler, Inhale 2 puffs into the lungs every 6 (six) hours as needed for wheezing or shortness of breath., Disp: 8 g, Rfl: 5   Amikacin Sulfate Liposome (ARIKAYCE) 590 MG/8.4ML SUSP, Inhale 590 mg into the lungs daily., Disp: 235.2 mL, Rfl: 11   benzonatate (TESSALON) 200 MG capsule, Take 1 capsule (200 mg total) by mouth every 6 (six) hours as needed for cough., Disp: 30 capsule,  Rfl: 1   Cholecalciferol 25 MCG (1000 UT) capsule, Take 1,000 Units by mouth daily., Disp: , Rfl:    dolutegravir (TIVICAY) 50 MG tablet, TAKE 1 TABLET(50 MG) BY MOUTH DAILY, Disp: 30 tablet, Rfl: 5   emtricitabine-tenofovir AF (DESCOVY) 200-25 MG tablet, Take 1 tablet by mouth daily., Disp: 30 tablet, Rfl: 5   ipratropium-albuterol (DUONEB) 0.5-2.5 (3) MG/3ML SOLN, Take 3 mLs by nebulization every 6 (six) hours as needed (shortness of breath/wheezing)., Disp: 360 mL, Rfl: 5   linezolid (ZYVOX) 600 MG tablet, Take 1  tablet by mouth daily., Disp: 30 tablet, Rfl: 5   losartan (COZAAR) 50 MG tablet, Take 50 mg by mouth daily., Disp: , Rfl:    Misc. Devices KIT, Flutter Valve, Disp: 1 kit, Rfl: 0   Omadacycline Tosylate 150 MG TABS, Take 2 tablets by mouth daily., Disp: 60 tablet, Rfl: 11   ondansetron (ZOFRAN ODT) 4 MG disintegrating tablet, Take 1 tablet (4 mg total) by mouth every 8 (eight) hours as needed for nausea or vomiting., Disp: 20 tablet, Rfl: 0   oxyCODONE-acetaminophen (PERCOCET) 10-325 MG tablet, Take 1 tablet by mouth every 6 (six) hours as needed., Disp: , Rfl:    Tiotropium Bromide-Olodaterol (STIOLTO RESPIMAT) 2.5-2.5 MCG/ACT AERS, Inhale 2 puffs into the lungs daily., Disp: 4 g, Rfl: 6

## 2021-02-21 ENCOUNTER — Other Ambulatory Visit: Payer: Self-pay

## 2021-02-21 ENCOUNTER — Encounter: Payer: Self-pay | Admitting: Pulmonary Disease

## 2021-02-21 ENCOUNTER — Ambulatory Visit: Payer: Medicaid Other | Admitting: Pulmonary Disease

## 2021-02-21 VITALS — BP 124/80 | HR 92 | Ht 70.0 in | Wt 152.0 lb

## 2021-02-21 DIAGNOSIS — J984 Other disorders of lung: Secondary | ICD-10-CM

## 2021-02-21 DIAGNOSIS — R918 Other nonspecific abnormal finding of lung field: Secondary | ICD-10-CM | POA: Diagnosis not present

## 2021-02-21 DIAGNOSIS — J432 Centrilobular emphysema: Secondary | ICD-10-CM | POA: Diagnosis not present

## 2021-02-21 DIAGNOSIS — Z720 Tobacco use: Secondary | ICD-10-CM

## 2021-02-21 DIAGNOSIS — A310 Pulmonary mycobacterial infection: Secondary | ICD-10-CM

## 2021-02-21 NOTE — Patient Instructions (Addendum)
Continue on stiolto inhaler 2 puffs daily  Use albuterol inhaler as needed 1-2 puffs every 4-6 hours  Follow up in 3 months for pulmonary function tests.

## 2021-02-21 NOTE — Progress Notes (Signed)
Synopsis: Referred in 2019 for cavitary lung lesion, COPD by Sue Lush, PA-C. Previously a patient of Drs. McQuaid, Icard and [employer].  Subjective:   PATIENT ID: Calvin Schroeder GENDER: male DOB: May 31, 1960, MRN: [id number]  Smoking 1 pack per day  HPI  Chief Complaint  Patient presents with   Follow-up    1 yr f/u. States he has been doing well since last visit. Wants to discuss doing another PFT.    Calvin Schroeder is a 61 year old male, daily smoker with a history of HIV, cavitary pulmonary MAC and COPD who returns to pulmonary clinic for follow up.   He has been doing well since our last visit over 1 year ago. He is using stiolto inhaler as needed which is not often. He continues to smoke 1 pack of cigarettes daily. 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 He remains on linezolid, omadacycline and amikacin with no side effects for treatment of his MAC.   He is also following with CT Surgery regarding the cavitary lesions related to the MAC infection. Most recent CT Chest scan 12/2020 shows stable LUL cavitary lesion over the past year and stable nodules of the right lung over the past 2 years.  Past Medical History:  Diagnosis Date   Back pain    COPD (chronic obstructive pulmonary disease) (HCC)    emphysema    Dyspnea    with exertion    HIV (human immunodeficiency virus infection) ([place])    Lung mass 06/10/2013   MAI   Pneumonia    11/2016   Tobacco abuse 06/10/2013     Family History  Problem Relation Age of Onset   Cancer Mother        lung ca   Cancer Father        skin ca   Cancer Maternal Uncle        liver ca   Cancer Maternal Grandmother        bladder ca     Social History   Socioeconomic History   Marital status: Single    Spouse name: Not on file   Number of children: Not on file   Years of education: Not on file   Highest education level: Not on file  Occupational History   Not on file  Tobacco Use   Smoking status:  Every Day    Packs/day: 1.00    Years: 40.00    Pack years: 40.00    Types: Cigarettes   Smokeless tobacco: Never   Tobacco comments:    1/2 ppd as of 09/10/19  Vaping Use   Vaping Use: Never used  Substance and Sexual Activity   Alcohol use: Yes    Comment: daily - 3-4 alcohol   Drug use: Not Currently    Types: Marijuana    Comment: daily    Sexual activity: Not Currently    Partners: Female    Comment: refused  Other Topics Concern   Not on file  Social History Narrative   Not on file   Social Determinants of Health   Financial Resource Strain: Not on file  Food Insecurity: Not on file  Transportation Needs: Not on file  Physical Activity: Not on file  Stress: Not on file  Social Connections: Not on file  Intimate Partner Violence: Not on file     Allergies  Allergen Reactions   Rifampin     Reports he could not function for 4 days  Other reaction(s):  Other Reports he could not function for 4 days      Outpatient Medications Prior to Visit  Medication Sig Dispense Refill   albuterol (VENTOLIN HFA) 108 (90 Base) MCG/ACT inhaler Inhale 2 puffs into the lungs every 6 (six) hours as needed for wheezing or shortness of breath. 8 g 5   Amikacin Sulfate Liposome (ARIKAYCE) 590 MG/8.4ML SUSP Inhale 590 mg into the lungs daily. 235.2 mL 11   Cholecalciferol 25 MCG (1000 UT) capsule Take 1,000 Units by mouth daily.     dolutegravir (TIVICAY) 50 MG tablet TAKE 1 TABLET(50 MG) BY MOUTH DAILY 30 tablet 5   emtricitabine-tenofovir AF (DESCOVY) 200-25 MG tablet Take 1 tablet by mouth daily. 30 tablet 5   ipratropium-albuterol (DUONEB) 0.5-2.5 (3) MG/3ML SOLN Take 3 mLs by nebulization every 6 (six) hours as needed (shortness of breath/wheezing). 360 mL 5   linezolid (ZYVOX) 600 MG tablet Take 1 tablet by mouth daily. 30 tablet 5   losartan (COZAAR) 50 MG tablet Take 50 mg by mouth daily.     Misc. Devices KIT Flutter Valve 1 kit 0   Omadacycline Tosylate 150 MG TABS Take 2  tablets by mouth daily. 60 tablet 11   ondansetron (ZOFRAN ODT) 4 MG disintegrating tablet Take 1 tablet (4 mg total) by mouth every 8 (eight) hours as needed for nausea or vomiting. 20 tablet 0   oxyCODONE-acetaminophen (PERCOCET) 10-325 MG tablet Take 1 tablet by mouth every 6 (six) hours as needed.     Tiotropium Bromide-Olodaterol (STIOLTO RESPIMAT) 2.5-2.5 MCG/ACT AERS Inhale 2 puffs into the lungs daily. 4 g 6   benzonatate (TESSALON) 200 MG capsule Take 1 capsule (200 mg total) by mouth every 6 (six) hours as needed for cough. 30 capsule 1   No facility-administered medications prior to visit.   Review of Systems  Constitutional:  Negative for chills, diaphoresis, fever, malaise/fatigue and weight loss.  HENT:  Negative for congestion, sinus pain and sore throat.   Eyes: Negative.   Respiratory:  Negative for cough, hemoptysis, sputum production, shortness of breath and wheezing.   Cardiovascular:  Negative for chest pain, palpitations, orthopnea and leg swelling.  Gastrointestinal:  Negative for abdominal pain, heartburn, nausea and vomiting.  Genitourinary: Negative.   Musculoskeletal: Negative.   Skin:  Negative for rash.  Neurological: Negative.   Endo/Heme/Allergies: Negative.   Psychiatric/Behavioral: Negative.     Objective:   Vitals:   02/21/21 1556  BP: 124/80  Pulse: 92  SpO2: 97%  Weight: 152 lb (68.9 kg)  Height: _0  (1.778 m)    Physical Exam Constitutional:      General: He is not in acute distress.    Appearance: Normal appearance. He is normal weight.  HENT:     Head: Normocephalic and atraumatic.  Eyes:     General: No scleral icterus.    Conjunctiva/sclera: Conjunctivae normal.  Cardiovascular:     Rate and Rhythm: Normal rate.     Pulses: Normal pulses.     Heart sounds: Normal heart sounds.  Pulmonary:     Effort: Pulmonary effort is normal.     Breath sounds: Normal breath sounds.  Musculoskeletal:     Right lower leg: No edema.      Left lower leg: No edema.  Neurological:     General: No focal deficit present.     Mental Status: He is alert.  Psychiatric:        Mood and Affect: Mood normal.  Behavior: Behavior normal.        Thought Content: Thought content normal.        Judgment: Judgment normal.   CBC    Component Value Date/Time   WBC 5.1 11/21/2020 1025   RBC 4.65 11/21/2020 1025   HGB 17.5 (H) 11/21/2020 1025   HGB 14.3 12/10/2016 1042   HCT 50.1 (H) 11/21/2020 1025   HCT 42.1 12/10/2016 1042   PLT 157 11/21/2020 1025   PLT 121 (L) 12/10/2016 1042   MCV 107.7 (H) 11/21/2020 1025   MCV 98.9 (H) 12/10/2016 1042   MCH 37.6 (H) 11/21/2020 1025   MCHC 34.9 11/21/2020 1025   RDW 13.4 11/21/2020 1025   RDW 12.6 12/10/2016 1042   LYMPHSABS 0.7 07/02/2019 0811   LYMPHSABS 0.7 (L) 12/10/2016 1042   MONOABS 0.7 07/02/2019 0811   MONOABS 0.4 12/10/2016 1042   EOSABS 0.0 07/02/2019 0811   EOSABS 0.1 12/10/2016 1042   BASOSABS 0.0 07/02/2019 0811   BASOSABS 0.0 12/10/2016 1042   BMP Latest Ref Rng & Units 11/21/2020 09/22/2019 07/02/2019  Glucose 65 - 99 mg/dL 95 95 110(H)  BUN 7 - 25 mg/dL 6(L) 10 10  Creatinine 0.70 - 1.30 mg/dL 0.63(L) 0.86 0.83  BUN/Creat Ratio 6 - 22 (calc) 10 NOT APPLICABLE -  Sodium 638 - 146 mmol/L 137 139 129(L)  Potassium 3.5 - 5.3 mmol/L 3.5 4.8 3.9  Chloride 98 - 110 mmol/L 98 102 91(L)  CO2 20 - 32 mmol/L _0 Calcium 8.6 - 10.3 mg/dL 8.9 9.0 8.5(L)   Chest imaging: CT Chest 12/2020 Severe pulmonary emphysema with paraseptal in centrilobular emphysematous changes.   Area with cavitation in the LEFT upper lobe with bandlike changes that extend both the pleural surface and the LEFT suprahilar region, associated with retraction of the major fissure anteriorly. Also similar when compared to the study of December 07, 2019, diminished in size compared to more remote imaging.   Additional areas of nodularity throughout the chest which are also similar to  previous imaging with scattered areas of grouped nodularity throughout the chest without substantial change. Findings are compatible with chronic infection. Continued surveillance may be warranted given the spiculated nature of some of these areas particularly in the RIGHT upper lobe. Consider lung cancer screening if appropriate or 12 month follow-up as warranted.   Aortic atherosclerosis.   Aortic Atherosclerosis  CT Chest 12/07/19 1. Continued decrease in size of LEFT upper lobe cavitary area compatible with post infectious scarring and cavitation. Diminished nodularity about this area compatible with post infectious or inflammatory changes. 2. Nodules in the RIGHT chest are stable to slightly diminished in size but within 1 mm of previous measurements. Continued attention on follow-up is suggested, stable since December 2020, shown to be cavitary in August of 2020 also likely post infectious but with limited change in size. 3. Emphysema and aortic atherosclerosis.  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   He is  doing well from a COPD standpoint and will continue on stiolto respimat daily along with as needed albuterol.   He has no desire to quit smoking.   He is following with ID for the MAC infection and is to continue his regimen of arykace, omadacycline and zyvox.  His cavitary lesion and lung nodules are stable since last year.  Follow up CT Chest in 12/2021.  Follow up in 3  months with pulmonary function tests.  Freda Jackson, MD [facility] Office: [phone]    Current Outpatient Medications:    albuterol (VENTOLIN HFA) 108 (90 Base) MCG/ACT inhaler, Inhale 2 puffs into the lungs every 6 (six) hours as needed for wheezing or shortness of breath., Disp: 8 g, Rfl: 5   Amikacin Sulfate Liposome (ARIKAYCE) 590 MG/8.4ML SUSP, Inhale 590 mg into the lungs daily., Disp: 235.2 mL, Rfl: 11   Cholecalciferol 25 MCG (1000 UT) capsule, Take 1,000 Units by mouth daily., Disp: , Rfl:    dolutegravir (TIVICAY) 50 MG tablet, TAKE 1 TABLET(50 MG) BY MOUTH DAILY, Disp: 30 tablet, Rfl: 5   emtricitabine-tenofovir AF (DESCOVY) 200-25 MG tablet, Take 1 tablet by mouth daily., Disp: 30 tablet, Rfl: 5   ipratropium-albuterol (DUONEB) 0.5-2.5 (3) MG/3ML SOLN, Take 3 mLs by nebulization every 6 (six) hours as needed (shortness of breath/wheezing)., Disp: 360 mL, Rfl: 5   linezolid (ZYVOX) 600 MG tablet, Take 1 tablet by mouth daily., Disp: 30 tablet, Rfl: 5   losartan (COZAAR) 50 MG tablet, Take 50 mg by mouth daily., Disp: , Rfl:    Misc. Devices KIT, Flutter Valve, Disp: 1 kit, Rfl: 0   Omadacycline Tosylate 150 MG TABS, Take 2 tablets by mouth daily., Disp: 60 tablet, Rfl: 11   ondansetron (ZOFRAN ODT) 4 MG disintegrating tablet, Take 1 tablet (4 mg total) by mouth every 8 (eight) hours as needed for nausea or vomiting., Disp: 20 tablet, Rfl: 0   oxyCODONE-acetaminophen (PERCOCET) 10-325 MG tablet, Take 1 tablet by mouth every 6 (six) hours as needed., Disp: , Rfl:    Tiotropium Bromide-Olodaterol (STIOLTO RESPIMAT) 2.5-2.5 MCG/ACT AERS, Inhale 2 puffs into the lungs daily., Disp: 4 g, Rfl: 6

## 2021-05-21 ENCOUNTER — Ambulatory Visit (INDEPENDENT_AMBULATORY_CARE_PROVIDER_SITE_OTHER): Payer: Medicaid Other | Admitting: Pulmonary Disease

## 2021-05-21 DIAGNOSIS — J432 Centrilobular emphysema: Secondary | ICD-10-CM

## 2021-06-14 ENCOUNTER — Other Ambulatory Visit: Payer: Self-pay

## 2021-06-14 ENCOUNTER — Other Ambulatory Visit (HOSPITAL_COMMUNITY)
Admission: RE | Admit: 2021-06-14 | Discharge: 2021-06-14 | Disposition: A | Discharge status: Home / Self Care | Payer: Medicaid Other | Source: Ambulatory Visit | Attending: Infectious Diseases | Admitting: Infectious Diseases

## 2021-06-14 ENCOUNTER — Other Ambulatory Visit: Payer: Medicaid Other

## 2021-06-14 DIAGNOSIS — Z79899 Other long term (current) drug therapy: Secondary | ICD-10-CM

## 2021-06-14 DIAGNOSIS — B20 Human immunodeficiency virus [HIV] disease: Secondary | ICD-10-CM

## 2021-06-14 DIAGNOSIS — Z113 Encounter for screening for infections with a predominantly sexual mode of transmission: Secondary | ICD-10-CM

## 2021-06-15 ENCOUNTER — Telehealth: Payer: Self-pay

## 2021-06-15 ENCOUNTER — Other Ambulatory Visit: Payer: Self-pay

## 2021-06-15 DIAGNOSIS — E876 Hypokalemia: Secondary | ICD-10-CM

## 2021-06-15 LAB — T-HELPER CELL (CD4) - (RCID CLINIC ONLY)
CD4 % Helper T Cell: 28 % — ABNORMAL LOW (ref 33–65)
CD4 T Cell Abs: 344 /uL — ABNORMAL LOW (ref 400–1790)

## 2021-06-15 NOTE — Telephone Encounter (Signed)
Patient will be in Monday for repeat labs

## 2021-06-15 NOTE — Telephone Encounter (Signed)
-----   Message from Campbell Riches, MD sent at 06/15/2021 12:46 PM EDT ----- Regarding: FW: Mr Bless needs a repeat potassium please, asap Thanks  Merry Proud ----- Message ----- From: Cheyenne Adas Lab Results In Sent: 06/14/2021  11:24 PM EDT To: Campbell Riches, MD

## 2021-06-15 NOTE — Telephone Encounter (Signed)
I attempted to reach the patient to have him come back for a repeat potassium level. No answer and left voicemail for him to call back. Elcie Pelster T Brooks Sailors

## 2021-06-15 NOTE — Progress Notes (Signed)
Patient returned triage call and advised of low potasium level, also advised to patient per Dr. [name] to have potassium level repeated. Patient agrees to have repeat potassium level repeated and accepts lab appointment. Calvin Schroeder

## 2021-06-18 ENCOUNTER — Other Ambulatory Visit: Payer: Medicaid Other

## 2021-06-18 ENCOUNTER — Telehealth: Payer: Self-pay

## 2021-06-18 ENCOUNTER — Encounter: Payer: Self-pay | Admitting: Infectious Diseases

## 2021-06-18 LAB — URINE CYTOLOGY ANCILLARY ONLY
Chlamydia: NEGATIVE
Comment: NEGATIVE
Comment: NORMAL
Neisseria Gonorrhea: NEGATIVE

## 2021-06-18 NOTE — Telephone Encounter (Signed)
-----   Message from Campbell Riches, MD sent at 06/18/2021 11:09 AM EDT ----- Regarding: FW: Needs repeat potassium asap Thanks ----- Message ----- From: Cheyenne Adas Lab Results In Sent: 06/14/2021  11:24 PM EDT To: Campbell Riches, MD

## 2021-06-18 NOTE — Telephone Encounter (Signed)
Patient scheduled for repeat lab on 6/12 at 2:15 pm.  Binnie Kand, RN

## 2021-06-19 ENCOUNTER — Other Ambulatory Visit: Payer: Self-pay | Admitting: Infectious Diseases

## 2021-06-19 DIAGNOSIS — B20 Human immunodeficiency virus [HIV] disease: Secondary | ICD-10-CM

## 2021-06-19 LAB — COMPREHENSIVE METABOLIC PANEL
AG Ratio: 1.2 (calc) (ref 1.0–2.5)
ALT: 76 U/L — ABNORMAL HIGH (ref 9–46)
AST: 87 U/L — ABNORMAL HIGH (ref 10–35)
Albumin: 3.8 g/dL (ref 3.6–5.1)
Alkaline phosphatase (APISO): 112 U/L (ref 35–144)
BUN: 7 mg/dL (ref 7–25)
CO2: 24 mmol/L (ref 20–32)
Calcium: 8.7 mg/dL (ref 8.6–10.3)
Chloride: 101 mmol/L (ref 98–110)
Creat: 0.7 mg/dL (ref 0.70–1.30)
Globulin: 3.3 g/dL (calc) (ref 1.9–3.7)
Glucose, Bld: 132 mg/dL — ABNORMAL HIGH (ref 65–99)
Potassium: 3 mmol/L — ABNORMAL LOW (ref 3.5–5.3)
Sodium: 141 mmol/L (ref 135–146)
Total Bilirubin: 0.7 mg/dL (ref 0.2–1.2)
Total Protein: 7.1 g/dL (ref 6.1–8.1)

## 2021-06-19 LAB — CBC
HCT: 51.6 % — ABNORMAL HIGH (ref 38.5–50.0)
Hemoglobin: 19.1 g/dL — ABNORMAL HIGH (ref 13.2–17.1)
MCH: 40.5 pg — ABNORMAL HIGH (ref 27.0–33.0)
MCHC: 37 g/dL — ABNORMAL HIGH (ref 32.0–36.0)
MCV: 109.3 fL — ABNORMAL HIGH (ref 80.0–100.0)
MPV: 9.5 fL (ref 7.5–12.5)
Platelets: 193 10*3/uL (ref 140–400)
RBC: 4.72 10*6/uL (ref 4.20–5.80)
RDW: 14.5 % (ref 11.0–15.0)
WBC: 6.4 10*3/uL (ref 3.8–10.8)

## 2021-06-19 LAB — HIV-1 RNA QUANT-NO REFLEX-BLD
HIV 1 RNA Quant: <20 Copies/mL — ABNORMAL HIGH
HIV-1 RNA Quant, Log: <1.3 Log cps/mL — ABNORMAL HIGH

## 2021-06-19 LAB — LIPID PANEL
Cholesterol: 131 mg/dL (ref <200)
HDL: 61 mg/dL (ref >=40)
LDL Cholesterol (Calc): 51 mg/dL (calc)
Non-HDL Cholesterol (Calc): 70 mg/dL (calc) (ref <130)
Total CHOL/HDL Ratio: 2.1 (calc) (ref <5.0)
Triglycerides: 109 mg/dL (ref <150)

## 2021-06-19 LAB — RPR: RPR Ser Ql: NONREACTIVE

## 2021-06-20 ENCOUNTER — Other Ambulatory Visit: Payer: Self-pay

## 2021-06-20 ENCOUNTER — Other Ambulatory Visit: Payer: Medicaid Other

## 2021-06-20 DIAGNOSIS — E876 Hypokalemia: Secondary | ICD-10-CM

## 2021-06-20 DIAGNOSIS — B20 Human immunodeficiency virus [HIV] disease: Secondary | ICD-10-CM

## 2021-06-21 ENCOUNTER — Telehealth: Payer: Self-pay

## 2021-06-21 ENCOUNTER — Other Ambulatory Visit: Payer: Self-pay | Admitting: Infectious Diseases

## 2021-06-21 ENCOUNTER — Emergency Department (HOSPITAL_COMMUNITY)
Admission: EM | Admit: 2021-06-21 | Discharge: 2021-06-22 | Discharge status: Against Medical Advice | Payer: Medicaid Other | Attending: Physician Assistant | Admitting: Physician Assistant

## 2021-06-21 ENCOUNTER — Encounter (HOSPITAL_COMMUNITY): Payer: Self-pay

## 2021-06-21 DIAGNOSIS — Z5321 Procedure and treatment not carried out due to patient leaving prior to being seen by health care provider: Secondary | ICD-10-CM | POA: Insufficient documentation

## 2021-06-21 DIAGNOSIS — E876 Hypokalemia: Secondary | ICD-10-CM

## 2021-06-21 LAB — BASIC METABOLIC PANEL
Anion gap: 11 (ref 5–15)
BUN: <5 mg/dL — ABNORMAL LOW (ref 6–20)
CO2: 26 mmol/L (ref 22–32)
Calcium: 8.7 mg/dL — ABNORMAL LOW (ref 8.9–10.3)
Chloride: 101 mmol/L (ref 98–111)
Creatinine, Ser: 0.74 mg/dL (ref 0.61–1.24)
GFR, Estimated: >60 mL/min (ref >60)
Glucose, Bld: 101 mg/dL — ABNORMAL HIGH (ref 70–99)
Potassium: 2.7 mmol/L — CL (ref 3.5–5.1)
Sodium: 138 mmol/L (ref 135–145)

## 2021-06-21 LAB — CBC WITH DIFFERENTIAL/PLATELET
Abs Immature Granulocytes: 0.02 10*3/uL (ref 0.00–0.07)
Basophils Absolute: 0.1 10*3/uL (ref 0.0–0.1)
Basophils Relative: 1 %
Eosinophils Absolute: 0.2 10*3/uL (ref 0.0–0.5)
Eosinophils Relative: 3 %
HCT: 47.7 % (ref 39.0–52.0)
Hemoglobin: 17.6 g/dL — ABNORMAL HIGH (ref 13.0–17.0)
Immature Granulocytes: 0 %
Lymphocytes Relative: 34 %
Lymphs Abs: 2.2 10*3/uL (ref 0.7–4.0)
MCH: 39.6 pg — ABNORMAL HIGH (ref 26.0–34.0)
MCHC: 36.9 g/dL — ABNORMAL HIGH (ref 30.0–36.0)
MCV: 107.4 fL — ABNORMAL HIGH (ref 80.0–100.0)
Monocytes Absolute: 0.8 10*3/uL (ref 0.1–1.0)
Monocytes Relative: 12 %
Neutro Abs: 3.2 10*3/uL (ref 1.7–7.7)
Neutrophils Relative %: 50 %
Platelets: 192 10*3/uL (ref 150–400)
RBC: 4.44 MIL/uL (ref 4.22–5.81)
RDW: 13.8 % (ref 11.5–15.5)
WBC: 6.5 10*3/uL (ref 4.0–10.5)
nRBC: 0 % (ref 0.0–0.2)

## 2021-06-21 LAB — CBC
HCT: 51.5 % — ABNORMAL HIGH (ref 38.5–50.0)
Hemoglobin: 18.5 g/dL — ABNORMAL HIGH (ref 13.2–17.1)
MCH: 38.9 pg — ABNORMAL HIGH (ref 27.0–33.0)
MCHC: 35.9 g/dL (ref 32.0–36.0)
MCV: 108.2 fL — ABNORMAL HIGH (ref 80.0–100.0)
MPV: 10.3 fL (ref 7.5–12.5)
Platelets: 177 10*3/uL (ref 140–400)
RBC: 4.76 10*6/uL (ref 4.20–5.80)
RDW: 14.2 % (ref 11.0–15.0)
WBC: 6.7 10*3/uL (ref 3.8–10.8)

## 2021-06-21 LAB — POTASSIUM: Potassium: 2.9 mmol/L — ABNORMAL LOW (ref 3.5–5.3)

## 2021-06-21 MED ORDER — POTASSIUM CHLORIDE CRYS ER 20 MEQ PO TBCR: 40.0000 meq | EXTENDED_RELEASE_TABLET | Freq: Once | ORAL | 0 refills | Status: DC

## 2021-06-21 NOTE — ED Provider Triage Note (Signed)
Emergency Medicine Provider Triage Evaluation Note  Calvin Schroeder , a 61 y.o. male  was evaluated in triage.  Pt complains of having a low potassium  Review of Systems  Positive: weakness Negative:   Physical Exam  BP 95/67   Pulse 86   Temp 98 F (36.7 C) (Oral)   Resp 20   SpO2 96%  Gen:   Awake, no distress   Resp:  Normal effort  MSK:   Moves extremities without difficulty  Other:    Medical Decision Making  Medically screening exam initiated at 7:56 PM.  Appropriate orders placed.  HARM JOU was informed that the remainder of the evaluation will be completed by another provider, this initial triage assessment does not replace that evaluation, and the importance of remaining in the ED until their evaluation is complete.     Fransico Meadow, [place] 06/21/21 [phone]

## 2021-06-21 NOTE — Telephone Encounter (Signed)
-----   Message from Campbell Riches, MD sent at 06/21/2021 12:56 PM EDT ----- He needs to go to ED to have potassium repleted thanks

## 2021-06-21 NOTE — Telephone Encounter (Signed)
Patient returned call. Patient declined to go to ER and was adamant about not going. Patient stated that he would prefer to take potassium orally.   Spoke with Dr.Hatcher - patient can go to UC or he can send in potassium PO for patient to take.   Left voicemail asking patient to return my call to see what he would prefer.   [place], CMA

## 2021-06-21 NOTE — Telephone Encounter (Signed)
Attempted to call patient - no answer, left voicemail asking patient to return my call.    [place], CMA

## 2021-06-21 NOTE — Progress Notes (Signed)
Pt refuses ED for his low potassium Will send in Rx if he will not go to urgent care.  Needs repeat potassium 06-25-21

## 2021-06-21 NOTE — ED Triage Notes (Signed)
Pt states that he went to his PCP to have labs checked and was told his K was low at 2.9 and sent her for IV potasium. Denies any symptoms.

## 2021-06-22 ENCOUNTER — Other Ambulatory Visit: Payer: Self-pay

## 2021-06-22 ENCOUNTER — Encounter (HOSPITAL_COMMUNITY): Payer: Self-pay

## 2021-06-22 ENCOUNTER — Telehealth: Payer: Self-pay

## 2021-06-22 ENCOUNTER — Emergency Department (HOSPITAL_COMMUNITY)
Admission: EM | Admit: 2021-06-22 | Discharge: 2021-06-22 | Disposition: A | Discharge status: Home / Self Care | Payer: Medicaid Other | Source: Home / Self Care | Attending: Emergency Medicine | Admitting: Emergency Medicine

## 2021-06-22 ENCOUNTER — Other Ambulatory Visit: Payer: Self-pay | Admitting: Infectious Diseases

## 2021-06-22 DIAGNOSIS — E876 Hypokalemia: Secondary | ICD-10-CM

## 2021-06-22 LAB — BASIC METABOLIC PANEL
Anion gap: 10 (ref 5–15)
BUN: 7 mg/dL (ref 6–20)
CO2: 27 mmol/L (ref 22–32)
Calcium: 8.7 mg/dL — ABNORMAL LOW (ref 8.9–10.3)
Chloride: 104 mmol/L (ref 98–111)
Creatinine, Ser: 0.71 mg/dL (ref 0.61–1.24)
GFR, Estimated: >60 mL/min (ref >60)
Glucose, Bld: 92 mg/dL (ref 70–99)
Potassium: 3.3 mmol/L — ABNORMAL LOW (ref 3.5–5.1)
Sodium: 141 mmol/L (ref 135–145)

## 2021-06-22 MED ORDER — SODIUM CHLORIDE 0.9 % IV SOLN
INTRAVENOUS | Status: DC
Start: 2021-06-22 — End: 2021-06-22

## 2021-06-22 NOTE — ED Provider Triage Note (Signed)
Emergency Medicine Provider Triage Evaluation Note  Calvin Schroeder , a 61 y.o. male  was evaluated in triage.  Pt complains of low potassium.  Review of Systems  Positive: No weakness Negative: No vomiting  Physical Exam  BP 130/90 (BP Location: Left Arm)   Pulse (!) 108   Temp 98.3 F (36.8 C) (Oral)   Resp 18   Ht 1.778 m ('5\' 10"'$ )   Wt 70.3 kg   SpO2 97%   BMI 22.24 kg/m  Gen:   Awake, no distress   Resp:  Normal effort  MSK:   Moves extremities without difficulty  Other:    Medical Decision Making  Medically screening exam initiated at 10:27 AM.  Appropriate orders placed.  Calvin Schroeder was informed that the remainder of the evaluation will be completed by another provider, this initial triage assessment does not replace that evaluation, and the importance of remaining in the ED until their evaluation is complete.  We will recheck potassium here today   Lacretia Leigh, MD 06/22/21 1028

## 2021-06-22 NOTE — ED Triage Notes (Signed)
Patient reports that he was called last night and told that his potassium level was low-2.7. patient went to [facility] ED and left due to wait times.

## 2021-06-22 NOTE — ED Provider Notes (Signed)
Calvin Schroeder   CSN: [id number] Arrival date & time: 06/22/21  7517     History  Chief Complaint  Patient presents with   Abnormal Lab    Calvin Schroeder is a 61 y.o. male.  61 year old male presents with low potassium.  Seen at [facility] yesterday for same.  Did not stay for treatment.  Called his doctor and was prescribed potassium tablets.  Denies any history of volume loss.  Does not take diuretics.       Home Medications Prior to Admission medications   Medication Sig Start Date End Date Taking? Authorizing Provider  albuterol (VENTOLIN HFA) 108 (90 Base) MCG/ACT inhaler Inhale 2 puffs into the lungs every 6 (six) hours as needed for wheezing or shortness of breath. 05/02/20   Freddi Starr, MD  Amikacin Sulfate Liposome (ARIKAYCE) 590 MG/8.4ML SUSP Inhale 590 mg into the lungs daily. 07/21/20   Kuppelweiser, Cassie L, RPH-CPP  Cholecalciferol 25 MCG (1000 UT) capsule Take 1,000 Units by mouth daily. 06/30/19   [provider]  dolutegravir (TIVICAY) 50 MG tablet TAKE 1 TABLET(50 MG) BY MOUTH DAILY 01/04/21   Campbell Riches, MD  emtricitabine-tenofovir AF (DESCOVY) 200-25 MG tablet Take 1 tablet by mouth daily. 01/04/21   Campbell Riches, MD  ipratropium-albuterol (DUONEB) 0.5-2.5 (3) MG/3ML SOLN Take 3 mLs by nebulization every 6 (six) hours as needed (shortness of breath/wheezing). 04/05/20   Freddi Starr, MD  linezolid (ZYVOX) 600 MG tablet Take 1 tablet by mouth daily. 07/21/20   Campbell Riches, MD  losartan (COZAAR) 50 MG tablet Take 50 mg by mouth daily.    [provider]  Misc. Devices KIT Flutter Valve 06/25/19   Icard, Bradley L, DO  Omadacycline Tosylate 150 MG TABS Take 2 tablets by mouth daily. 07/21/20   Kuppelweiser, Cassie L, RPH-CPP  ondansetron (ZOFRAN ODT) 4 MG disintegrating tablet Take 1 tablet (4 mg total) by mouth every 8 (eight) hours as needed for nausea or  vomiting. 07/02/19   Gareth Morgan, MD  oxyCODONE-acetaminophen (PERCOCET) 10-325 MG tablet Take 1 tablet by mouth every 6 (six) hours as needed. 08/25/19   [provider]  potassium chloride SA (KLOR-CON M) 20 MEQ tablet Take 2 tablets (40 mEq total) by mouth once for 1 dose. 06/21/21 06/21/21  Campbell Riches, MD  Tiotropium Bromide-Olodaterol (STIOLTO RESPIMAT) 2.5-2.5 MCG/ACT AERS Inhale 2 puffs into the lungs daily. 05/02/20   Freddi Starr, MD      Allergies    Rifampin    Review of Systems   Review of Systems  All other systems reviewed and are negative.   Physical Exam Updated Vital Signs BP (!) 135/99 (BP Location: Right Arm)   Pulse 99   Temp 98.4 F (36.9 C) (Oral)   Resp 18   Ht 1.778 m ($Remove'5\' 10"'zOOyThD$ )   Wt 70.3 kg   SpO2 93%   BMI 22.24 kg/m  Physical Exam Vitals and nursing Schroeder reviewed.  Constitutional:      General: He is not in acute distress.    Appearance: Normal appearance. He is well-developed. He is not toxic-appearing.  HENT:     Head: Normocephalic and atraumatic.  Eyes:     General: Lids are normal.     Conjunctiva/sclera: Conjunctivae normal.     Pupils: Pupils are equal, round, and reactive to light.  Neck:     Thyroid: No thyroid mass.     Trachea: No  tracheal deviation.  Cardiovascular:     Rate and Rhythm: Normal rate and regular rhythm.     Heart sounds: Normal heart sounds. No murmur heard.    No gallop.  Pulmonary:     Effort: Pulmonary effort is normal. No respiratory distress.     Breath sounds: Normal breath sounds. No stridor. No decreased breath sounds, wheezing, rhonchi or rales.  Abdominal:     General: There is no distension.     Palpations: Abdomen is soft.     Tenderness: There is no abdominal tenderness. There is no rebound.  Musculoskeletal:        General: No tenderness. Normal range of motion.     Cervical back: Normal range of motion and neck supple.  Skin:    General: Skin is warm and dry.     Findings:  No abrasion or rash.  Neurological:     Mental Status: He is alert and oriented to person, place, and time. Mental status is at baseline.     GCS: GCS eye subscore is 4. GCS verbal subscore is 5. GCS motor subscore is 6.     Cranial Nerves: No cranial nerve deficit.     Sensory: No sensory deficit.     Motor: Motor function is intact.  Psychiatric:        Attention and Perception: Attention normal.        Speech: Speech normal.        Behavior: Behavior normal.     ED Results / Procedures / Treatments   Labs (all labs ordered are listed, but only abnormal results are displayed) Labs Reviewed  BASIC METABOLIC PANEL - Abnormal; Notable for the following components:      Result Value   Potassium 3.3 (*)    Calcium 8.7 (*)    All other components within normal limits    EKG None  Radiology No results found.  Procedures Procedures    Medications Ordered in ED Medications  0.9 %  sodium chloride infusion (has no administration in time range)    ED Course/ Medical Decision Making/ A&P                           Medical Decision Making Amount and/or Complexity of Data Reviewed Labs: ordered.  Risk Prescription drug management.   Patient's repeat potassium here is 3.3.  He is asymptomatic.  Plan will be for him to continue his potassium tablets and follow-up with his doctor as needed        Final Clinical Impression(s) / ED Diagnoses Final diagnoses:  None    Rx / DC Orders ED Discharge Orders     None         Lacretia Leigh, MD 06/22/21 1159

## 2021-06-22 NOTE — Telephone Encounter (Signed)
Please advise on refill.

## 2021-06-22 NOTE — Telephone Encounter (Signed)
Patient called office stating he went to Emergency room today regarding low potassium level. Patient is requesting refill on potassium. Informed patient that provider will need to review labs before we can send in refill.  Patient is currently taking over the counter supplement.  Will call patient back with providers response. Leatrice Jewels, RMA

## 2021-06-22 NOTE — ED Notes (Signed)
Called x2 for vitals recheck with no response

## 2021-06-25 MED ORDER — POTASSIUM CHLORIDE CRYS ER 20 MEQ PO TBCR: 40.0000 meq | EXTENDED_RELEASE_TABLET | Freq: Every day | ORAL | 0 refills | Status: DC

## 2021-06-25 NOTE — Addendum Note (Signed)
Addended by: Leatrice Jewels on: 06/25/2021 10:30 AM   Modules accepted: Orders

## 2021-06-28 ENCOUNTER — Ambulatory Visit: Payer: Medicaid Other | Admitting: Infectious Diseases

## 2021-06-28 ENCOUNTER — Other Ambulatory Visit: Payer: Self-pay

## 2021-06-28 VITALS — BP 143/96 | HR 94 | Resp 16 | Ht 70.0 in | Wt 148.0 lb

## 2021-06-28 DIAGNOSIS — Z720 Tobacco use: Secondary | ICD-10-CM

## 2021-06-28 DIAGNOSIS — J439 Emphysema, unspecified: Secondary | ICD-10-CM

## 2021-06-28 DIAGNOSIS — A310 Pulmonary mycobacterial infection: Secondary | ICD-10-CM | POA: Diagnosis not present

## 2021-06-28 DIAGNOSIS — Z79899 Other long term (current) drug therapy: Secondary | ICD-10-CM | POA: Diagnosis not present

## 2021-06-28 DIAGNOSIS — R918 Other nonspecific abnormal finding of lung field: Secondary | ICD-10-CM | POA: Diagnosis not present

## 2021-06-28 DIAGNOSIS — A318 Other mycobacterial infections: Secondary | ICD-10-CM

## 2021-06-28 DIAGNOSIS — Z113 Encounter for screening for infections with a predominantly sexual mode of transmission: Secondary | ICD-10-CM | POA: Diagnosis present

## 2021-06-28 DIAGNOSIS — B20 Human immunodeficiency virus [HIV] disease: Secondary | ICD-10-CM

## 2021-06-28 DIAGNOSIS — E876 Hypokalemia: Secondary | ICD-10-CM | POA: Diagnosis not present

## 2021-06-28 MED ORDER — OMADACYCLINE TOSYLATE 150 MG PO TABS: 300.0000 mg | ORAL_TABLET | Freq: Every day | ORAL | 11 refills | Status: DC

## 2021-06-28 MED ORDER — ARIKAYCE 590 MG/8.4ML IN SUSP: 590.0000 mg | Freq: Every day | RESPIRATORY_TRACT | 11 refills | Status: DC

## 2021-06-28 MED ORDER — LINEZOLID 600 MG PO TABS: 600.0000 mg | ORAL_TABLET | Freq: Every day | ORAL | 5 refills | Status: DC

## 2021-06-28 NOTE — Assessment & Plan Note (Signed)
Encouraged to stop.  F/u CT Dec.

## 2021-06-28 NOTE — Assessment & Plan Note (Signed)
Appreciate CCM f/u.

## 2021-06-28 NOTE — Progress Notes (Signed)
Subjective:    Patient ID: Calvin Schroeder, male  DOB: 05-Sep-1960, 61 y.o.        MRN: [id number]   HPI 61yo M previously found to have cystic lesion on chest CT on adm for PNA. 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 He was seen in ID 03-2017 was found to have AIDS. He was started on E/A/R however developed n/v (that persisted despite zofran)  Restarted MAI rx ~12-2017.    He was started on tivicay-descovey. He has had f/u with pulm for COPD.  He has also f/u with CVTS for a cyctic mass in LUL.  His last CVTS f/u was 06-2019- He had repeat CT at that time that showed: 1. The large thick-walled, cavitary left upper lobe lesion has not significantly changed in size from the most recent CT and is most consistent with postinflammatory scarring given relative stability for several years. 2. There is increased multifocal nodularity at the left apex superior to this cavitary lesion, which appears inflammatory. Continued follow-up in 6-12 months suggested. 3. Additional bilateral pulmonary nodules are stable. No adenopathy or pleural effusion. 4. Aortic Atherosclerosis (ICD10-I70.0) and Emphysema (ICD10-J43.9).   He had f/u appt with Pulm 07-09-19 and had repeat Cx showing M abscessus.       Mycobacterium abscessus complex  Amikacin 4.0 ug/mL Susceptible  Cefoxitin Comment  Comment: 32.0 ug/mL Intermediate  Ciprofloxacin >4.0 ug/mL Resistant  Clarithromycin >16.0 ug/mL Resistant  Comment: This organism has been evaluated for inducible macrolide resistance.  Doxycycline 16.0 ug/mL Resistant  Imipenem Comment  Comment: 8.0 ug/mL Intermediate  Linezolid Comment  Comment: 1.0 ug/mL or less, Susceptible  Minocycline >8.0 ug/mL Resistant  Moxifloxacin Comment  Comment: 2.0 ug/mL Intermediate  Tigecycline 0.25 ug/mL  Tobramycin CANCELED  Comment: Test not performed   Result canceled by the ancillary.  Trimethoprim/Sulfa Comment  Comment: >8/152 ug/mL Resistant    He has also been seen by Onc  for KS.   He was seen in ID and by pharm in August- his M abscessus therapy was changed to  clofazamine (which he has not been able to tolerate due to n/v), linezolid and omdacycline. He did not take.  September 2021, he was given arikace. He quit taking the clofazamine (diarrhea). He also took zyvox which made him nauseated. He did not start omadacycline, too scared. October 2021- not clear he was taking any of his M abscessus therapy. Reinforced need to take. November 30-21 CT scan- 1. Continued decrease in size of LEFT upper lobe cavitary area compatible with post infectious scarring and cavitation. Diminished nodularity about this area compatible with post infectious or inflammatory changes. 2. Nodules in the RIGHT chest are stable to slightly diminished in size but within 1 mm of previous measurements. Continued attention on follow-up is suggested, stable since December 2020, shown to be cavitary in August of 2020 also likely post infectious but with limited change in size. 3. Emphysema and aortic atherosclerosis.   He has had intermittent adherence to his M abscesses rx (better with the arykace, difficult with timing of meds and meals).     He has had the neck mass removed 3 weeks prior. (12-27-19 [facility]- path- thyroglossal duct cyst).  Pending cervical fusion.    12-2020 he has f/u with CVTS, and  repeat CT scan following.  He is taking his M abscessus rx (since July 2022) (but he can't name them.  Severe pulmonary emphysema with paraseptal in centrilobular emphysematous changes.   Area with cavitation in the LEFT  upper lobe with bandlike changes that extend both the pleural surface and the LEFT suprahilar region, associated with retraction of the major fissure anteriorly. Also similar when compared to the study of December 07, 2019, diminished in size compared to more remote imaging.   Additional areas of nodularity throughout the chest which are also similar to previous  imaging with scattered areas of grouped nodularity throughout the chest without substantial change. Findings are compatible with chronic infection. Continued surveillance may be warranted given the spiculated nature of some of these areas particularly in the RIGHT upper lobe. Consider lung cancer screening if appropriate or 12 month follow-up as warranted.   No problems with DTGV/descovey.  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 Denies MSM activity.  Recently eval for hypoK after his blood work here. Given supplement. No diarrhea; has had regular emesis (dry heaves every morning). Doesn't feel like it comes from his stomach. Has noted since removal of thyroglossal duct cyst.  States he has been taking his M absccesus rx. States Dr [name] is not going to do another CT til Dec.  2-3 mixed drinks/day.   HIV 1 RNA Quant (Copies/mL)  Date Value  06/14/2021 <20 (H)  11/21/2020 Not Detected  09/22/2019 <20   CD4 T Cell Abs (/uL)  Date Value  06/14/2021 344 (L)  11/21/2020 282 (L)  09/22/2019 292 (L)     Health Maintenance  Topic Date Due   INFLUENZA VACCINE  08/07/2021   TETANUS/TDAP  07/02/2028   COLONOSCOPY (Pts 45-62yr Insurance coverage will need to be confirmed)  01/07/2029   COVID-19 Vaccine  Completed   Hepatitis C Screening  Completed   HIV Screening  Completed   Zoster Vaccines- Shingrix  Completed   HPV VACCINES  Aged Out      Review of Systems  Constitutional:  Negative for chills, fever and weight loss.  HENT:  Positive for congestion.   Respiratory:  Negative for cough, sputum production and shortness of breath.   Cardiovascular:  Negative for chest pain.  Gastrointestinal:  Negative for constipation and diarrhea.  Genitourinary:  Negative for dysuria.  Musculoskeletal:  Positive for myalgias.  Neurological:  Negative for headaches.    Please see HPI. All other systems reviewed and negative.     Objective:  Physical Exam Vitals reviewed.  Constitutional:       Appearance: Normal appearance.  HENT:     Mouth/Throat:     Mouth: Mucous membranes are moist.     Pharynx: No oropharyngeal exudate.  Eyes:     Extraocular Movements: Extraocular movements intact.     Pupils: Pupils are equal, round, and reactive to light.  Cardiovascular:     Rate and Rhythm: Normal rate and regular rhythm.  Pulmonary:     Effort: Pulmonary effort is normal.  Abdominal:     General: Bowel sounds are normal. There is no distension.     Palpations: Abdomen is soft.  Musculoskeletal:     Cervical back: Normal range of motion and neck supple.     Right lower leg: No edema.     Left lower leg: No edema.  Neurological:     General: No focal deficit present.     Mental Status: He is alert.            Assessment & Plan:

## 2021-06-28 NOTE — Assessment & Plan Note (Addendum)
Not sure of cause.  Will repeat his K before considering more supplementation.  He has started an OTC supplement as well.

## 2021-06-29 ENCOUNTER — Other Ambulatory Visit: Payer: Self-pay

## 2021-06-29 ENCOUNTER — Telehealth: Payer: Self-pay

## 2021-06-29 DIAGNOSIS — E876 Hypokalemia: Secondary | ICD-10-CM

## 2021-06-29 LAB — COMPREHENSIVE METABOLIC PANEL
AG Ratio: 1.1 (calc) (ref 1.0–2.5)
ALT: 63 U/L — ABNORMAL HIGH (ref 9–46)
AST: 56 U/L — ABNORMAL HIGH (ref 10–35)
Albumin: 4.1 g/dL (ref 3.6–5.1)
Alkaline phosphatase (APISO): 115 U/L (ref 35–144)
BUN: 7 mg/dL (ref 7–25)
CO2: 29 mmol/L (ref 20–32)
Calcium: 9.2 mg/dL (ref 8.6–10.3)
Chloride: 100 mmol/L (ref 98–110)
Creat: 0.73 mg/dL (ref 0.70–1.30)
Globulin: 3.6 g/dL (calc) (ref 1.9–3.7)
Glucose, Bld: 103 mg/dL — ABNORMAL HIGH (ref 65–99)
Potassium: 3.3 mmol/L — ABNORMAL LOW (ref 3.5–5.3)
Sodium: 141 mmol/L (ref 135–146)
Total Bilirubin: 0.8 mg/dL (ref 0.2–1.2)
Total Protein: 7.7 g/dL (ref 6.1–8.1)

## 2021-06-29 MED ORDER — POTASSIUM CHLORIDE CRYS ER 20 MEQ PO TBCR: 40.0000 meq | EXTENDED_RELEASE_TABLET | Freq: Every day | ORAL | 0 refills | Status: DC

## 2021-07-03 ENCOUNTER — Other Ambulatory Visit: Payer: Medicaid Other

## 2021-07-06 ENCOUNTER — Other Ambulatory Visit: Payer: Self-pay

## 2021-07-06 ENCOUNTER — Other Ambulatory Visit: Payer: Medicaid Other

## 2021-07-06 DIAGNOSIS — E876 Hypokalemia: Secondary | ICD-10-CM

## 2021-07-07 LAB — COMPREHENSIVE METABOLIC PANEL
AG Ratio: 1.1 (calc) (ref 1.0–2.5)
ALT: 55 U/L — ABNORMAL HIGH (ref 9–46)
AST: 56 U/L — ABNORMAL HIGH (ref 10–35)
Albumin: 3.9 g/dL (ref 3.6–5.1)
Alkaline phosphatase (APISO): 115 U/L (ref 35–144)
BUN/Creatinine Ratio: 7 (calc) (ref 6–22)
BUN: 6 mg/dL — ABNORMAL LOW (ref 7–25)
CO2: 25 mmol/L (ref 20–32)
Calcium: 8.5 mg/dL — ABNORMAL LOW (ref 8.6–10.3)
Chloride: 102 mmol/L (ref 98–110)
Creat: 0.84 mg/dL (ref 0.70–1.30)
Globulin: 3.7 g/dL (calc) (ref 1.9–3.7)
Glucose, Bld: 111 mg/dL — ABNORMAL HIGH (ref 65–99)
Potassium: 3.6 mmol/L (ref 3.5–5.3)
Sodium: 138 mmol/L (ref 135–146)
Total Bilirubin: 0.9 mg/dL (ref 0.2–1.2)
Total Protein: 7.6 g/dL (ref 6.1–8.1)

## 2021-07-09 ENCOUNTER — Other Ambulatory Visit (HOSPITAL_COMMUNITY): Payer: Self-pay

## 2021-07-12 ENCOUNTER — Other Ambulatory Visit: Payer: Self-pay

## 2021-07-12 DIAGNOSIS — E876 Hypokalemia: Secondary | ICD-10-CM

## 2021-07-12 DIAGNOSIS — A310 Pulmonary mycobacterial infection: Secondary | ICD-10-CM

## 2021-07-12 DIAGNOSIS — A318 Other mycobacterial infections: Secondary | ICD-10-CM

## 2021-07-12 DIAGNOSIS — R918 Other nonspecific abnormal finding of lung field: Secondary | ICD-10-CM

## 2021-07-12 MED ORDER — ARIKAYCE 590 MG/8.4ML IN SUSP
590.0000 mg | Freq: Every day | RESPIRATORY_TRACT | 11 refills | Status: DC
  Filled 2021-07-12 – 2021-08-02 (×4): qty 235.2, 28d supply, fill #0

## 2021-07-12 MED ORDER — OMADACYCLINE TOSYLATE 150 MG PO TABS
300.0000 mg | ORAL_TABLET | Freq: Every day | ORAL | 11 refills | Status: DC
  Filled 2021-07-12: qty 60, 30d supply, fill #0
  Filled 2021-07-17 – 2021-08-02 (×2): qty 24, 12d supply, fill #0

## 2021-07-13 ENCOUNTER — Other Ambulatory Visit (HOSPITAL_COMMUNITY): Payer: Self-pay

## 2021-07-16 ENCOUNTER — Other Ambulatory Visit (HOSPITAL_COMMUNITY): Payer: Self-pay

## 2021-07-16 ENCOUNTER — Other Ambulatory Visit: Payer: Self-pay | Admitting: Infectious Diseases

## 2021-07-17 ENCOUNTER — Other Ambulatory Visit (HOSPITAL_COMMUNITY): Payer: Self-pay

## 2021-07-17 ENCOUNTER — Telehealth: Payer: Self-pay

## 2021-07-17 NOTE — Telephone Encounter (Signed)
Patient called office today to confirm Arikayce and Omadacycline Tosy was sent to pharmacy. Informed patient that refills were sent on 7/6 to [facility]. Verbalized understanding. Leatrice Jewels, RMA

## 2021-07-18 ENCOUNTER — Other Ambulatory Visit (HOSPITAL_COMMUNITY): Payer: Self-pay

## 2021-07-19 ENCOUNTER — Other Ambulatory Visit (HOSPITAL_COMMUNITY): Payer: Self-pay

## 2021-07-20 ENCOUNTER — Encounter: Payer: Self-pay | Admitting: Pulmonary Disease

## 2021-07-20 ENCOUNTER — Ambulatory Visit: Payer: Medicaid Other | Admitting: Pulmonary Disease

## 2021-07-20 ENCOUNTER — Other Ambulatory Visit (HOSPITAL_COMMUNITY): Payer: Self-pay

## 2021-07-20 ENCOUNTER — Ambulatory Visit (INDEPENDENT_AMBULATORY_CARE_PROVIDER_SITE_OTHER): Payer: Medicaid Other | Admitting: Pulmonary Disease

## 2021-07-20 VITALS — BP 128/84 | HR 108 | Ht 68.0 in | Wt 153.0 lb

## 2021-07-20 DIAGNOSIS — J432 Centrilobular emphysema: Secondary | ICD-10-CM | POA: Diagnosis not present

## 2021-07-20 MED ORDER — TRELEGY ELLIPTA 100-62.5-25 MCG/ACT IN AEPB: 1.0000 | INHALATION_SPRAY | Freq: Every day | RESPIRATORY_TRACT | 11 refills | Status: DC

## 2021-07-20 MED ORDER — IPRATROPIUM-ALBUTEROL 0.5-2.5 (3) MG/3ML IN SOLN: 3.0000 mL | Freq: Four times a day (QID) | RESPIRATORY_TRACT | 5 refills | Status: DC | PRN

## 2021-07-20 MED ORDER — ALBUTEROL SULFATE HFA 108 (90 BASE) MCG/ACT IN AERS: 2.0000 | INHALATION_SPRAY | Freq: Four times a day (QID) | RESPIRATORY_TRACT | 5 refills | Status: DC | PRN

## 2021-07-20 MED ORDER — STIOLTO RESPIMAT 2.5-2.5 MCG/ACT IN AERS: 2.0000 | INHALATION_SPRAY | Freq: Every day | RESPIRATORY_TRACT | 11 refills | Status: DC

## 2021-07-20 NOTE — Patient Instructions (Addendum)
Continue stiolto inhaler 2 puffs daily  Continue to use albuterol inhaler 1-2 puffs every 4-6 hours as needed  We will consider changing your inhaler regimen to trelegy ellipta in the future if needed  Follow up in 6 months

## 2021-07-20 NOTE — Patient Instructions (Signed)
Full PFT performed today. °

## 2021-07-20 NOTE — Progress Notes (Signed)
Full PFT performed today. °

## 2021-07-20 NOTE — Progress Notes (Unsigned)
Synopsis: Referred in 2019 for cavitary lung lesion, COPD by Sue Lush, PA-C. Previously a patient of Drs. McQuaid, [facility] and [facility].  Subjective:   PATIENT ID: Calvin Schroeder: male DOB: 07-13-1960, MRN: [id number]  Smoking 1 pack per day  HPI  Chief Complaint  Patient presents with   Follow-up    F/U after PFT. States his breathing has been stable since last visit.    Arik Husmann is a 61 year old male, daily smoker with a history of HIV, cavitary pulmonary MAC and COPD who returns to pulmonary clinic for follow up.   He has been doing well since our last visit over 1 year ago. He is using stiolto inhaler as needed which is not often. He continues to smoke 1 pack of cigarettes daily. 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 He remains on linezolid, omadacycline and amikacin with no side effects for treatment of his MAC.   He is also following with CT Surgery regarding the cavitary lesions related to the MAC infection. Most recent CT Chest scan 12/2020 shows stable LUL cavitary lesion over the past year and stable nodules of the right lung over the past 2 years.  Past Medical History:  Diagnosis Date   Back pain    COPD (chronic obstructive pulmonary disease) (HCC)    emphysema    Dyspnea    with exertion    HIV (human immunodeficiency virus infection) ([place])    Lung mass 06/10/2013   MAI   Pneumonia    11/2016   Tobacco abuse 06/10/2013     Family History  Problem Relation Age of Onset   Cancer Mother        lung ca   Cancer Father        skin ca   Cancer Maternal Uncle        liver ca   Cancer Maternal Grandmother        bladder ca     Social History   Socioeconomic History   Marital status: Single    Spouse name: Not on file   Number of children: Not on file   Years of education: Not on file   Highest education level: Not on file  Occupational History   Not on file  Tobacco Use   Smoking status: Every Day     Packs/day: 1.00    Years: 40.00    Total pack years: 40.00    Types: Cigarettes   Smokeless tobacco: Never   Tobacco comments:    1/2 ppd as of 09/10/19  Vaping Use   Vaping Use: Never used  Substance and Sexual Activity   Alcohol use: Yes    Comment: daily - 3-4 alcohol   Drug use: Not Currently    Types: Marijuana    Comment: daily    Sexual activity: Not Currently    Partners: Female    Comment: refused  Other Topics Concern   Not on file  Social History Narrative   Not on file   Social Determinants of Health   Financial Resource Strain: Not on file  Food Insecurity: Not on file  Transportation Needs: Not on file  Physical Activity: Not on file  Stress: Not on file  Social Connections: Not on file  Intimate Partner Violence: Not on file     Allergies  Allergen Reactions   Rifampin     Reports he could not function for 4 days  Other reaction(s): Other Reports he could not  function for 4 days      Outpatient Medications Prior to Visit  Medication Sig Dispense Refill   albuterol (VENTOLIN HFA) 108 (90 Base) MCG/ACT inhaler Inhale 2 puffs into the lungs every 6 (six) hours as needed for wheezing or shortness of breath. 8 g 5   Amikacin Sulfate Liposome (ARIKAYCE) 590 MG/8.4ML SUSP Inhale 590 mg into the lungs daily. 235.2 mL 11   Cholecalciferol 25 MCG (1000 UT) capsule Take 1,000 Units by mouth daily.     dolutegravir (TIVICAY) 50 MG tablet TAKE 1 TABLET(50 MG) BY MOUTH DAILY 30 tablet 5   emtricitabine-tenofovir AF (DESCOVY) 200-25 MG tablet Take 1 tablet by mouth daily. 30 tablet 5   ipratropium-albuterol (DUONEB) 0.5-2.5 (3) MG/3ML SOLN Take 3 mLs by nebulization every 6 (six) hours as needed (shortness of breath/wheezing). 360 mL 5   linezolid (ZYVOX) 600 MG tablet Take 1 tablet by mouth daily. 30 tablet 5   losartan (COZAAR) 50 MG tablet Take 50 mg by mouth daily.     Misc. Devices KIT Flutter Valve 1 kit 0   Omadacycline Tosylate 150 MG TABS Take 2 tablets by  mouth daily. 60 tablet 11   ondansetron (ZOFRAN ODT) 4 MG disintegrating tablet Take 1 tablet (4 mg total) by mouth every 8 (eight) hours as needed for nausea or vomiting. 20 tablet 0   Oxycodone HCl 10 MG TABS Take by mouth.     Tiotropium Bromide-Olodaterol (STIOLTO RESPIMAT) 2.5-2.5 MCG/ACT AERS Inhale 2 puffs into the lungs daily. 4 g 6   No facility-administered medications prior to visit.   Review of Systems  Constitutional:  Negative for chills, diaphoresis, fever, malaise/fatigue and weight loss.  HENT:  Negative for congestion, sinus pain and sore throat.   Eyes: Negative.   Respiratory:  Negative for cough, hemoptysis, sputum production, shortness of breath and wheezing.   Cardiovascular:  Negative for chest pain, palpitations, orthopnea and leg swelling.  Gastrointestinal:  Negative for abdominal pain, heartburn, nausea and vomiting.  Genitourinary: Negative.   Musculoskeletal: Negative.   Skin:  Negative for rash.  Neurological: Negative.   Endo/Heme/Allergies: Negative.   Psychiatric/Behavioral: Negative.      Objective:   Vitals:   07/20/21 1619  BP: 128/84  Pulse: (!) 108  SpO2: 96%  Weight: 153 lb (69.4 kg)  Height: _0  (1.727 m)    Physical Exam Constitutional:      General: He is not in acute distress.    Appearance: Normal appearance. He is normal weight.  HENT:     Head: Normocephalic and atraumatic.  Eyes:     General: No scleral icterus.    Conjunctiva/sclera: Conjunctivae normal.  Cardiovascular:     Rate and Rhythm: Normal rate.     Pulses: Normal pulses.     Heart sounds: Normal heart sounds.  Pulmonary:     Effort: Pulmonary effort is normal.     Breath sounds: Normal breath sounds.  Musculoskeletal:     Right lower leg: No edema.     Left lower leg: No edema.  Neurological:     General: No focal deficit present.     Mental Status: He is alert.  Psychiatric:        Mood and Affect: Mood normal.        Behavior: Behavior normal.         Thought Content: Thought content normal.        Judgment: Judgment normal.    CBC    Component Value Date/Time  WBC 6.5 06/21/2021 2000   RBC 4.44 06/21/2021 2000   HGB 17.6 (H) 06/21/2021 2000   HGB 14.3 12/10/2016 1042   HCT 47.7 06/21/2021 2000   HCT 42.1 12/10/2016 1042   PLT 192 06/21/2021 2000   PLT 121 (L) 12/10/2016 1042   MCV 107.4 (H) 06/21/2021 2000   MCV 98.9 (H) 12/10/2016 1042   MCH 39.6 (H) 06/21/2021 2000   MCHC 36.9 (H) 06/21/2021 2000   RDW 13.8 06/21/2021 2000   RDW 12.6 12/10/2016 1042   LYMPHSABS 2.2 06/21/2021 2000   LYMPHSABS 0.7 (L) 12/10/2016 1042   MONOABS 0.8 06/21/2021 2000   MONOABS 0.4 12/10/2016 1042   EOSABS 0.2 06/21/2021 2000   EOSABS 0.1 12/10/2016 1042   BASOSABS 0.1 06/21/2021 2000   BASOSABS 0.0 12/10/2016 1042      Latest Ref Rng & Units 07/06/2021    1:55 AM 06/28/2021    2:42 PM 06/22/2021   10:48 AM  BMP  Glucose 65 - 99 mg/dL 111  103  92   BUN 7 - 25 mg/dL _0 Creatinine 0.70 - 1.30 mg/dL 0.84  0.73  0.71   BUN/Creat Ratio 6 - 22 (calc) 7  NOT APPLICABLE    Sodium 400 - 146 mmol/L 138  141  141   Potassium 3.5 - 5.3 mmol/L 3.6  3.3  3.3   Chloride 98 - 110 mmol/L 102  100  104   CO2 20 - 32 mmol/L _1 Calcium 8.6 - 10.3 mg/dL 8.5  9.2  8.7    Chest imaging: CT Chest 12/2020 Severe pulmonary emphysema with paraseptal in centrilobular emphysematous changes.   Area with cavitation in the LEFT upper lobe with bandlike changes that extend both the pleural surface and the LEFT suprahilar region, associated with retraction of the major fissure anteriorly. Also similar when compared to the study of December 07, 2019, diminished in size compared to more remote imaging.   Additional areas of nodularity throughout the chest which are also similar to previous imaging with scattered areas of grouped nodularity throughout the chest without substantial change. Findings are compatible with chronic infection.  Continued surveillance may be warranted given the spiculated nature of some of these areas particularly in the RIGHT upper lobe. Consider lung cancer screening if appropriate or 12 month follow-up as warranted.   Aortic atherosclerosis.   Aortic Atherosclerosis  CT Chest 12/07/19 1. Continued decrease in size of LEFT upper lobe cavitary area compatible with post infectious scarring and cavitation. Diminished nodularity about this area compatible with post infectious or inflammatory changes. 2. Nodules in the RIGHT chest are stable to slightly diminished in size but within 1 mm of previous measurements. Continued attention on follow-up is suggested, stable since December 2020, shown to be cavitary in August of 2020 also likely post infectious but with limited change in size. 3. Emphysema and aortic atherosclerosis.  PFT:    Latest Ref Rng & Units 06/11/2017   10:40 AM  PFT Results  FVC-Pre L 3.63   FVC-Predicted Pre % 73   Pre FEV1/FVC % % 65   FEV1-Pre L 2.34   FEV1-Predicted Pre % 61      Assessment & Plan:   Centrilobular emphysema (HCC)  Discussion: Karthikeya Funke is a 61 year old male, daily smoker with a history of HIV, cavitary pulmonary MAC and COPD who returns to pulmonary clinic for follow up.   He is doing well from  a COPD standpoint and will continue on stiolto respimat daily along with as needed albuterol.   He has no desire to quit smoking.   He is following with ID for the MAC infection and is to continue his regimen of arykace, omadacycline and zyvox.  His cavitary lesion and lung nodules are stable since last year.  Follow up CT Chest in 12/2021.  Follow up in 3 months with pulmonary function tests.  Freda Jackson, MD [facility] Office: [phone]    Current Outpatient Medications:    albuterol (VENTOLIN HFA) 108 (90 Base) MCG/ACT inhaler, Inhale 2 puffs into the lungs every 6 (six) hours as needed for wheezing or  shortness of breath., Disp: 8 g, Rfl: 5   Amikacin Sulfate Liposome (ARIKAYCE) 590 MG/8.4ML SUSP, Inhale 590 mg into the lungs daily., Disp: 235.2 mL, Rfl: 11   Cholecalciferol 25 MCG (1000 UT) capsule, Take 1,000 Units by mouth daily., Disp: , Rfl:    dolutegravir (TIVICAY) 50 MG tablet, TAKE 1 TABLET(50 MG) BY MOUTH DAILY, Disp: 30 tablet, Rfl: 5   emtricitabine-tenofovir AF (DESCOVY) 200-25 MG tablet, Take 1 tablet by mouth daily., Disp: 30 tablet, Rfl: 5   ipratropium-albuterol (DUONEB) 0.5-2.5 (3) MG/3ML SOLN, Take 3 mLs by nebulization every 6 (six) hours as needed (shortness of breath/wheezing)., Disp: 360 mL, Rfl: 5   linezolid (ZYVOX) 600 MG tablet, Take 1 tablet by mouth daily., Disp: 30 tablet, Rfl: 5   losartan (COZAAR) 50 MG tablet, Take 50 mg by mouth daily., Disp: , Rfl:    Misc. Devices KIT, Flutter Valve, Disp: 1 kit, Rfl: 0   Omadacycline Tosylate 150 MG TABS, Take 2 tablets by mouth daily., Disp: 60 tablet, Rfl: 11   ondansetron (ZOFRAN ODT) 4 MG disintegrating tablet, Take 1 tablet (4 mg total) by mouth every 8 (eight) hours as needed for nausea or vomiting., Disp: 20 tablet, Rfl: 0   Oxycodone HCl 10 MG TABS, Take by mouth., Disp: , Rfl:    Tiotropium Bromide-Olodaterol (STIOLTO RESPIMAT) 2.5-2.5 MCG/ACT AERS, Inhale 2 puffs into the lungs daily., Disp: 4 g, Rfl: 6

## 2021-07-21 LAB — PULMONARY FUNCTION TEST
DL/VA % pred: 65 %
DL/VA: 2.82 ml/min/mmHg/L
DLCO cor % pred: 57 %
DLCO cor: 15.01 ml/min/mmHg
DLCO unc % pred: 62 %
DLCO unc: 16.43 ml/min/mmHg
FEF 25-75 Post: 1.41 L/sec
FEF 25-75 Pre: 0.89 L/sec
FEF2575-%Change-Post: 57 %
FEF2575-%Pred-Post: 48 %
FEF2575-%Pred-Pre: 30 %
FEV1-%Change-Post: 15 %
FEV1-%Pred-Post: 63 %
FEV1-%Pred-Pre: 55 %
FEV1-Post: 2.18 L
FEV1-Pre: 1.89 L
FEV1FVC-%Change-Post: 1 %
FEV1FVC-%Pred-Pre: 78 %
FEV6-%Change-Post: 13 %
FEV6-%Pred-Post: 83 %
FEV6-%Pred-Pre: 73 %
FEV6-Post: 3.58 L
FEV6-Pre: 3.16 L
FEV6FVC-%Pred-Post: 104 %
FEV6FVC-%Pred-Pre: 104 %
FVC-%Change-Post: 13 %
FVC-%Pred-Post: 79 %
FVC-%Pred-Pre: 70 %
FVC-Post: 3.58 L
FVC-Pre: 3.16 L
Post FEV1/FVC ratio: 61 %
Post FEV6/FVC ratio: 100 %
Pre FEV1/FVC ratio: 60 %
Pre FEV6/FVC Ratio: 100 %

## 2021-07-22 ENCOUNTER — Other Ambulatory Visit: Payer: Self-pay | Admitting: Infectious Diseases

## 2021-07-22 DIAGNOSIS — B20 Human immunodeficiency virus [HIV] disease: Secondary | ICD-10-CM

## 2021-07-23 ENCOUNTER — Other Ambulatory Visit (HOSPITAL_COMMUNITY): Payer: Self-pay

## 2021-07-23 ENCOUNTER — Encounter: Payer: Self-pay | Admitting: Pulmonary Disease

## 2021-07-24 ENCOUNTER — Other Ambulatory Visit (HOSPITAL_COMMUNITY): Payer: Self-pay

## 2021-07-26 ENCOUNTER — Telehealth: Payer: Self-pay

## 2021-07-26 ENCOUNTER — Other Ambulatory Visit (HOSPITAL_COMMUNITY): Payer: Self-pay

## 2021-07-26 NOTE — Telephone Encounter (Signed)
A Prior Authorization was initiated for this patients ARIKAYCE through CoverMyMeds.   Key: [id number]

## 2021-07-26 NOTE — Telephone Encounter (Signed)
Prior Auth for patients medication ARIKAYCE SUSPENSION approved by [employer]ICA[employer] from 07/26/21 to 07/27/22.  Key: [id number]

## 2021-07-27 ENCOUNTER — Other Ambulatory Visit: Payer: Self-pay | Admitting: *Deleted

## 2021-07-27 ENCOUNTER — Other Ambulatory Visit (HOSPITAL_COMMUNITY): Payer: Self-pay

## 2021-07-27 MED ORDER — IPRATROPIUM-ALBUTEROL 0.5-2.5 (3) MG/3ML IN SOLN
3.0000 mL | Freq: Four times a day (QID) | RESPIRATORY_TRACT | 5 refills | Status: DC | PRN
Start: 2021-07-27 — End: 2022-04-29

## 2021-08-02 ENCOUNTER — Other Ambulatory Visit (HOSPITAL_COMMUNITY): Payer: Self-pay

## 2021-08-06 NOTE — Progress Notes (Addendum)
error 

## 2021-08-09 ENCOUNTER — Other Ambulatory Visit (HOSPITAL_COMMUNITY): Payer: Self-pay

## 2021-08-10 ENCOUNTER — Other Ambulatory Visit (HOSPITAL_COMMUNITY): Payer: Self-pay

## 2021-08-10 ENCOUNTER — Telehealth: Payer: Self-pay

## 2021-08-10 NOTE — Telephone Encounter (Signed)
A Prior Authorization was initiated for this patients NUZYRA '150MG'$  through CoverMyMeds.   Key: [id number]

## 2021-08-10 NOTE — Telephone Encounter (Signed)
Prior Auth for patients medication NUZYRA approved by [employer] MEDICAID thru 08/11/22  Key: [id number]   Patients pharmacy notified.

## 2021-08-30 ENCOUNTER — Other Ambulatory Visit (HOSPITAL_COMMUNITY): Payer: Self-pay

## 2021-09-04 ENCOUNTER — Telehealth: Payer: Self-pay

## 2021-09-04 NOTE — Telephone Encounter (Signed)
[facility] says pt never picked up Arikayce medication once PA was approved.  Left vm with pt regarding pickup status. Told pt reach out to pharmacy for refill/pickup info.

## 2021-09-11 ENCOUNTER — Other Ambulatory Visit (HOSPITAL_COMMUNITY): Payer: Self-pay

## 2021-09-12 ENCOUNTER — Other Ambulatory Visit (HOSPITAL_COMMUNITY): Payer: Self-pay

## 2021-09-20 ENCOUNTER — Other Ambulatory Visit (HOSPITAL_COMMUNITY): Payer: Self-pay

## 2021-09-21 ENCOUNTER — Other Ambulatory Visit (HOSPITAL_COMMUNITY): Payer: Self-pay

## 2021-10-06 ENCOUNTER — Other Ambulatory Visit (HOSPITAL_COMMUNITY): Payer: Self-pay

## 2021-10-11 ENCOUNTER — Other Ambulatory Visit (HOSPITAL_COMMUNITY): Payer: Self-pay

## 2021-11-12 ENCOUNTER — Other Ambulatory Visit: Payer: Self-pay | Admitting: Thoracic Surgery (Cardiothoracic Vascular Surgery)

## 2021-11-12 DIAGNOSIS — R918 Other nonspecific abnormal finding of lung field: Secondary | ICD-10-CM

## 2021-11-12 DIAGNOSIS — J984 Other disorders of lung: Secondary | ICD-10-CM

## 2022-01-02 ENCOUNTER — Other Ambulatory Visit: Payer: Medicaid Other

## 2022-01-06 ENCOUNTER — Other Ambulatory Visit: Payer: Self-pay | Admitting: Infectious Diseases

## 2022-01-06 DIAGNOSIS — B20 Human immunodeficiency virus [HIV] disease: Secondary | ICD-10-CM

## 2022-01-08 ENCOUNTER — Ambulatory Visit: Payer: Medicaid Other | Admitting: Thoracic Surgery (Cardiothoracic Vascular Surgery)

## 2022-01-29 ENCOUNTER — Ambulatory Visit
Admission: RE | Admit: 2022-01-29 | Discharge: 2022-01-29 | Disposition: A | Discharge status: Home / Self Care | Payer: Medicaid Other | Source: Ambulatory Visit | Attending: Thoracic Surgery (Cardiothoracic Vascular Surgery) | Admitting: Thoracic Surgery (Cardiothoracic Vascular Surgery)

## 2022-01-29 DIAGNOSIS — R918 Other nonspecific abnormal finding of lung field: Secondary | ICD-10-CM

## 2022-01-29 DIAGNOSIS — J984 Other disorders of lung: Secondary | ICD-10-CM

## 2022-02-05 ENCOUNTER — Other Ambulatory Visit: Payer: Medicaid Other

## 2022-02-05 ENCOUNTER — Other Ambulatory Visit: Payer: Self-pay

## 2022-02-05 ENCOUNTER — Other Ambulatory Visit (HOSPITAL_COMMUNITY)
Admission: RE | Admit: 2022-02-05 | Discharge: 2022-02-05 | Disposition: A | Discharge status: Home / Self Care | Payer: Medicaid Other | Source: Ambulatory Visit | Attending: Internal Medicine | Admitting: Internal Medicine

## 2022-02-05 DIAGNOSIS — Z79899 Other long term (current) drug therapy: Secondary | ICD-10-CM | POA: Insufficient documentation

## 2022-02-05 DIAGNOSIS — B20 Human immunodeficiency virus [HIV] disease: Secondary | ICD-10-CM

## 2022-02-05 DIAGNOSIS — Z113 Encounter for screening for infections with a predominantly sexual mode of transmission: Secondary | ICD-10-CM | POA: Insufficient documentation

## 2022-02-06 LAB — URINE CYTOLOGY ANCILLARY ONLY
Chlamydia: NEGATIVE
Comment: NEGATIVE
Comment: NORMAL
Neisseria Gonorrhea: NEGATIVE

## 2022-02-06 LAB — T-HELPER CELL (CD4) - (RCID CLINIC ONLY)
CD4 % Helper T Cell: 29 % — ABNORMAL LOW (ref 33–65)
CD4 T Cell Abs: 420 /uL (ref 400–1790)

## 2022-02-07 LAB — CBC WITH DIFFERENTIAL/PLATELET
Absolute Monocytes: 672 cells/uL (ref 200–950)
Basophils Absolute: 94 cells/uL (ref 0–200)
Basophils Relative: 1.1 %
Eosinophils Absolute: 60 cells/uL (ref 15–500)
Eosinophils Relative: 0.7 %
HCT: 43.7 % (ref 38.5–50.0)
Hemoglobin: 15.8 g/dL (ref 13.2–17.1)
Lymphs Abs: 1615 cells/uL (ref 850–3900)
MCH: 37.4 pg — ABNORMAL HIGH (ref 27.0–33.0)
MCHC: 36.2 g/dL — ABNORMAL HIGH (ref 32.0–36.0)
MCV: 103.3 fL — ABNORMAL HIGH (ref 80.0–100.0)
MPV: 9.6 fL (ref 7.5–12.5)
Monocytes Relative: 7.9 %
Neutro Abs: 6061 cells/uL (ref 1500–7800)
Neutrophils Relative %: 71.3 %
Platelets: 295 10*3/uL (ref 140–400)
RBC: 4.23 10*6/uL (ref 4.20–5.80)
RDW: 13.2 % (ref 11.0–15.0)
Total Lymphocyte: 19 %
WBC: 8.5 10*3/uL (ref 3.8–10.8)

## 2022-02-07 LAB — LIPID PANEL
Cholesterol: 170 mg/dL (ref <200)
HDL: 68 mg/dL (ref >=40)
LDL Cholesterol (Calc): 84 mg/dL (calc)
Non-HDL Cholesterol (Calc): 102 mg/dL (calc) (ref <130)
Total CHOL/HDL Ratio: 2.5 (calc) (ref <5.0)
Triglycerides: 85 mg/dL (ref <150)

## 2022-02-07 LAB — COMPLETE METABOLIC PANEL WITH GFR
AG Ratio: 1.1 (calc) (ref 1.0–2.5)
ALT: 29 U/L (ref 9–46)
AST: 23 U/L (ref 10–35)
Albumin: 4.3 g/dL (ref 3.6–5.1)
Alkaline phosphatase (APISO): 88 U/L (ref 35–144)
BUN/Creatinine Ratio: 18 (calc) (ref 6–22)
BUN: 10 mg/dL (ref 7–25)
CO2: 28 mmol/L (ref 20–32)
Calcium: 9.6 mg/dL (ref 8.6–10.3)
Chloride: 101 mmol/L (ref 98–110)
Creat: 0.57 mg/dL — ABNORMAL LOW (ref 0.70–1.30)
Globulin: 3.8 g/dL (calc) — ABNORMAL HIGH (ref 1.9–3.7)
Glucose, Bld: 111 mg/dL — ABNORMAL HIGH (ref 65–99)
Potassium: 3.5 mmol/L (ref 3.5–5.3)
Sodium: 140 mmol/L (ref 135–146)
Total Bilirubin: 0.5 mg/dL (ref 0.2–1.2)
Total Protein: 8.1 g/dL (ref 6.1–8.1)
eGFR: 113 mL/min/{1.73_m2} (ref >=60)

## 2022-02-07 LAB — HIV-1 RNA QUANT-NO REFLEX-BLD
HIV 1 RNA Quant: <20 Copies/mL — ABNORMAL HIGH
HIV-1 RNA Quant, Log: <1.3 Log cps/mL — ABNORMAL HIGH

## 2022-02-07 LAB — RPR: RPR Ser Ql: NONREACTIVE

## 2022-02-12 ENCOUNTER — Ambulatory Visit: Payer: Medicaid Other | Admitting: Pulmonary Disease

## 2022-02-12 ENCOUNTER — Encounter: Payer: Self-pay | Admitting: Thoracic Surgery (Cardiothoracic Vascular Surgery)

## 2022-02-12 ENCOUNTER — Ambulatory Visit: Payer: Medicaid Other | Admitting: Thoracic Surgery (Cardiothoracic Vascular Surgery)

## 2022-02-12 VITALS — BP 176/124 | HR 116 | Resp 20 | Ht 70.0 in | Wt 154.0 lb

## 2022-02-12 DIAGNOSIS — J984 Other disorders of lung: Secondary | ICD-10-CM | POA: Diagnosis not present

## 2022-02-12 NOTE — Progress Notes (Signed)
[address]       [place] [address]             [phone]    HPI: Calvin Schroeder returns for follow-up regarding cavitary lung lesions.  Calvin Schroeder is a 62 year old man with a history of tobacco abuse, COPD, pulmonary MAI, HIV, Kaposi's sarcoma, and chronic neck and back pain.  In 2018 he was found to have a complex consolidative process in the left upper lobe with cavitation.  Bronchoscopy showed granulomas and cultures grew MAI.  He has been on treatment since then.  He has been followed with serial CT scans.  I last saw him in December 2022.  Findings were stable at that time and he was advised to return for a 1 year follow-up.  In the interim since his last visit he continues to smoke.  He says he has cut back a bit.  Has not been checking his blood pressure at home.  A lot of issues with nausea with his current antibiotic regimen.  Past Medical History:  Diagnosis Date   Back pain    COPD (chronic obstructive pulmonary disease) (HCC)    emphysema    Dyspnea    with exertion    HIV (human immunodeficiency virus infection) ([place])    Lung mass 06/10/2013   MAI   Pneumonia    11/2016   Tobacco abuse 06/10/2013    Current Outpatient Medications  Medication Sig Dispense Refill   albuterol (VENTOLIN HFA) 108 (90 Base) MCG/ACT inhaler Inhale 2 puffs into the lungs every 6 (six) hours as needed for wheezing or shortness of breath. 8 g 5   Amikacin Sulfate Liposome (ARIKAYCE) 590 MG/8.4ML SUSP Inhale 590 mg into the lungs daily. 235.2 mL 11   Cholecalciferol 25 MCG (1000 UT) capsule Take 1,000 Units by mouth daily.     DESCOVY 200-25 MG tablet TAKE 1 TABLET BY MOUTH DAILY 30 tablet 5   ipratropium-albuterol (DUONEB) 0.5-2.5 (3) MG/3ML SOLN Take 3 mLs by nebulization every 6 (six) hours as needed (shortness of breath/wheezing). 360 mL 5   linezolid (ZYVOX) 600 MG tablet Take 1 tablet by mouth daily. 30 tablet 5   losartan (COZAAR) 50 MG tablet Take 50 mg by  mouth daily.     Misc. Devices KIT Flutter Valve 1 kit 0   Omadacycline Tosylate 150 MG TABS Take 2 tablets by mouth daily. 60 tablet 11   ondansetron (ZOFRAN ODT) 4 MG disintegrating tablet Take 1 tablet (4 mg total) by mouth every 8 (eight) hours as needed for nausea or vomiting. 20 tablet 0   Tiotropium Bromide-Olodaterol (STIOLTO RESPIMAT) 2.5-2.5 MCG/ACT AERS Inhale 2 puffs into the lungs daily. 4 g 11   TIVICAY 50 MG tablet TAKE 1 TABLET(50 MG) BY MOUTH DAILY 30 tablet 5   No current facility-administered medications for this visit.    Physical Exam BP (!) 176/124 (BP Location: Left Arm, Cuff Size: Normal)   Pulse (!) 116   Resp 20   Ht '5\' 10"'$  (1.778 m)   Wt 154 lb (69.9 kg)   SpO2 97% Comment: RA  BMI 22.85 kg/m  62 year old man in no acute distress Alert and oriented x 3 with no focal deficits Lungs diminished breath sounds bilaterally, no wheezing Cardiac tachycardic, regular No peripheral edema  Diagnostic Tests: CT CHEST WITHOUT CONTRAST   TECHNIQUE: Multidetector CT imaging of the chest was performed following the standard protocol without IV contrast.   RADIATION DOSE REDUCTION:  This exam was performed according to the departmental dose-optimization program which includes automated exposure control, adjustment of the mA and/or kV according to patient size and/or use of iterative reconstruction technique.   COMPARISON:  Chest CT 12/22/2020, 06/29/2020 and 12/07/2019.   FINDINGS: Cardiovascular: Mild atherosclerosis of the aorta, great vessels and coronary arteries. Trace pericardial fluid or thickening. The heart size is normal.   Mediastinum/Nodes: There are no enlarged mediastinal, hilar or axillary lymph nodes.Hilar assessment is limited by the lack of intravenous contrast, although the hilar contours appear unchanged. The thyroid gland, trachea and esophagus demonstrate no significant findings.   Lungs/Pleura: No pleural effusion or pneumothorax.  Moderate centrilobular and paraseptal emphysema again noted. No acute findings or significant changes are identified within the right lung. Areas of branching right upper lobe nodularity are unchanged, largest measuring 1.8 cm on image 67/5 and 2.1 x 1.3 cm on image 81/5. The chronic cavitary left upper lobe lesion with thick walls, surrounding scarring and volume loss are unchanged. As measured in a similar fashion to the most recent study, the cavitary lesion measures up to 6.6 x 3.1 cm on image 40/5 (previously 6.6 x 3.4 cm). Just inferior to this is a new nodular lesion measuring 1.2 x 0.7 cm on image 58/5 and 1.3 cm on sagittal image 133/4. Benign-appearing grouped nodules in the lingula are stable. No other new or enlarging nodules are identified. Minimal ground-glass opacity dependently in the left lower lobe may reflect atelectasis or inflammation.   Upper abdomen: Hepatic low density consistent with steatosis. No focal or acute abnormality identified.   Musculoskeletal/Chest wall: There is no chest wall mass or suspicious osseous finding.   IMPRESSION: 1. The dominant cavitary left upper lobe lesion has not significantly changed, although there is a new adjacent nodular lesion in the left upper lobe measuring up to 1.3 cm in diameter. This is favored to be inflammatory/postinflammatory, although neoplasm not entirely excluded. Suggest attention on shorter term follow-up CT (3-6 months). 2. No other changes or suspicious findings. Stable chronic right lung findings. No adenopathy or pleural effusion. 3. Hepatic steatosis. 4. Aortic Atherosclerosis (ICD10-I70.0) and Emphysema (ICD10-J43.9).     Electronically Signed   By: Richardean Sale M.D.   On: 01/29/2022 12:50   I personally reviewed the CT images.  Chronic changes stable.  New left upper lobe nodule 1.3 cm in diameter.  Appearance consistent with infectious/inflammatory nodule.  Impression: Calvin Schroeder is a  62 year old man with a history of tobacco abuse, COPD, pulmonary MAI, HIV, Kaposi's sarcoma, and chronic neck and back pain.   Cavitary left upper lobe process-previous biopsy showed granulomas and cultures grew MAC.  Followed by Dr. [name].  Stable.  Left upper lobe lung nodule-new from his previous scan about 14 months ago.  Most likely infectious inflammatory.  Given his smoking history we will repeat a CT in 6 months to be on the safe side.  Tobacco abuse-continues to smoke.  He says he has cut back but is nonspecific about the amount.  Encouraged to quit smoking.  Hypertension-blood pressure poorly controlled today.  Recommended he check himself on a regular basis and if he sees numbers above 409 systolic consistently, he should check with the primary care for medication adjustment.  Plan: Monitor blood pressure and follow-up with primary care Return in 6 months with CT chest  [name], MD [facility] [phone]

## 2022-02-19 ENCOUNTER — Ambulatory Visit: Payer: Medicaid Other | Admitting: Internal Medicine

## 2022-02-19 ENCOUNTER — Other Ambulatory Visit: Payer: Self-pay

## 2022-02-19 ENCOUNTER — Encounter: Payer: Self-pay | Admitting: Internal Medicine

## 2022-02-19 VITALS — BP 123/88 | HR 89 | Temp 98.1°F | Resp 16 | Wt 156.0 lb

## 2022-02-19 DIAGNOSIS — B20 Human immunodeficiency virus [HIV] disease: Secondary | ICD-10-CM | POA: Diagnosis not present

## 2022-02-19 DIAGNOSIS — A318 Other mycobacterial infections: Secondary | ICD-10-CM

## 2022-02-19 MED ORDER — BICTEGRAVIR-EMTRICITAB-TENOFOV 50-200-25 MG PO TABS: 1.0000 | ORAL_TABLET | Freq: Every day | ORAL | 11 refills | Status: DC

## 2022-02-19 NOTE — Addendum Note (Signed)
Addended by: Tomi Bamberger on: 02/19/2022 03:14 PM   Modules accepted: Orders

## 2022-02-19 NOTE — Patient Instructions (Addendum)
We'll change your tivicay/descovy today to biktarvy -- one tablet once a day (Continue tivicay/descovy until you run out from current bottles, then start biktarvy)  Will revisit each other in around 6 weeks   Will order 2 sets of sputum -- do this 3 separate week (one set per week). Do it first thing in the morning after you wake up. Do not eat/drink/wash-gargle your mouth (because the water is contaminated with these organism)

## 2022-02-19 NOTE — Progress Notes (Signed)
Subjective:    Patient ID: Calvin Schroeder, male  DOB: 27-Dec-1960, 62 y.o.        MRN: [id number]   HPI 62yo M previously found to have cystic lesion on chest CT on adm for PNA. 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 He was seen in ID 03-2017 was found to have AIDS. He was started on E/A/R however developed n/v (that persisted despite zofran)  Restarted MAI rx ~12-2017.    He was started on tivicay-descovy. He has had f/u with pulm for COPD.  He has also f/u with CVTS for a cyctic mass in LUL.  His last CVTS f/u was 06-2019- He had repeat CT at that time that showed: 1. The large thick-walled, cavitary left upper lobe lesion has not significantly changed in size from the most recent CT and is most consistent with postinflammatory scarring given relative stability for several years. 2. There is increased multifocal nodularity at the left apex superior to this cavitary lesion, which appears inflammatory. Continued follow-up in 6-12 months suggested. 3. Additional bilateral pulmonary nodules are stable. No adenopathy or pleural effusion. 4. Aortic Atherosclerosis (ICD10-I70.0) and Emphysema (ICD10-J43.9).   He had f/u appt with Pulm 07-09-19 and had repeat Cx showing M abscessus.       Mycobacterium abscessus complex  Amikacin 4.0 ug/mL Susceptible  Cefoxitin Comment  Comment: 32.0 ug/mL Intermediate  Ciprofloxacin >4.0 ug/mL Resistant  Clarithromycin >16.0 ug/mL Resistant  Comment: This organism has been evaluated for inducible macrolide resistance.  Doxycycline 16.0 ug/mL Resistant  Imipenem Comment  Comment: 8.0 ug/mL Intermediate  Linezolid Comment  Comment: 1.0 ug/mL or less, Susceptible  Minocycline >8.0 ug/mL Resistant  Moxifloxacin Comment  Comment: 2.0 ug/mL Intermediate  Tigecycline 0.25 ug/mL  Tobramycin CANCELED  Comment: Test not performed   Result canceled by the ancillary.  Trimethoprim/Sulfa Comment  Comment: >8/152 ug/mL Resistant    He has also been seen by Onc  for KS.   He was seen in ID and by pharm in August- his M abscessus therapy was changed to  clofazamine (which he has not been able to tolerate due to n/v), linezolid and omdacycline. He did not take.  September 2021, he was given arikace. He quit taking the clofazamine (diarrhea). He also took zyvox which made him nauseated. He did not start omadacycline, too scared. October 2021- not clear he was taking any of his M abscessus therapy. Reinforced need to take.  November 30-21 CT scan- 1. Continued decrease in size of LEFT upper lobe cavitary area compatible with post infectious scarring and cavitation. Diminishednodularity about this area compatible with post infectious or inflammatory changes. 2. Nodules in the RIGHT chest are stable to slightly diminished in size but within 1 mm of previous measurements. Continued attention on follow-up is suggested, stable since December 2020, shown to be cavitary in August of 2020 also likely post infectious but with limited change in size. 3. Emphysema and aortic atherosclerosis.   He has had intermittent adherence to his M abscesses rx (better with the arykace, difficult with timing of meds and meals).     He has had the neck mass removed 3 weeks prior. (12-27-19 [facility]- path- thyroglossal duct cyst).  Pending cervical fusion.    12-2020 he has f/u with CVTS, and  repeat CT scan following.  He is taking his M abscessus rx (since July 2022) (but he can't name them.  Severe pulmonary emphysema with paraseptal in centrilobular emphysematous changes.   Area with cavitation in the LEFT  upper lobe with bandlike changes that extend both the pleural surface and the LEFT suprahilar region, associated with retraction of the major fissure anteriorly. Also similar when compared to the study of December 07, 2019, diminished in size compared to more remote imaging.   Additional areas of nodularity throughout the chest which are also similar to previous imaging with  scattered areas of grouped nodularity throughout the chest without substantial change. Findings are compatible with chronic infection. Continued surveillance may be warranted given the spiculated nature of some of these areas particularly in the RIGHT upper lobe. Consider lung cancer screening if appropriate or 12 month follow-up as warranted.   No problems with DTGV/descovey.  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 Denies MSM activity.  Recently eval for hypoK after his blood work here. Given supplement. No diarrhea; has had regular emesis (dry heaves every morning). Doesn't feel like it comes from his stomach. Has noted since removal of thyroglossal duct cyst.  States he has been taking his M absccesus rx. States Dr [name] is not going to do another CT til Dec.  2-3 mixed drinks/day.   HIV 1 RNA Quant (Copies/mL)  Date Value  02/05/2022 <20 (H)  06/14/2021 <20 (H)  11/21/2020 Not Detected   CD4 T Cell Abs (/uL)  Date Value  02/05/2022 420  06/14/2021 344 (L)  11/21/2020 282 (L)   ------------------- 02/19/2022 id clinic f/u #hiv heterosexual Well controlled on tivicay/descovy. 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 He is currently on maintenance linezolid, omadocycline, and arikayce. No numbness/tingling, dyspnea. Has lots of nausea He gets sob coming back from the mailbox at his home as it sloped up. Not change in activity tolerance the past year No f/c/nightsweat  He has copd and uses inhalers. He does have lung doctors. His last pft 07/2021 showed fev1 55% decreased from 2019. He had afb sputum 07/30/19 that was negative but also positive test a few weeks prior to that  He still smokes 01/2022 chest ct stable lul cavitary lesions but new 1.3 cm nodule. He had seen pulmonology and plan to  repeat chest ct in 6 months     Health Maintenance  Topic Date Due   COVID-19 Vaccine (6 - 2023-24 season) 12/18/2021   Lung Cancer Screening  01/30/2023   DTaP/Tdap/Td (2 - Td or Tdap) 07/02/2028   COLONOSCOPY (Pts 45-19yr Insurance coverage will need to be confirmed)  01/07/2029   INFLUENZA VACCINE  Completed   Hepatitis C Screening  Completed   HIV Screening  Completed   Zoster Vaccines- Shingrix  Completed   HPV VACCINES  Aged Out     Ros: All other ros negative      Objective:  Physical Exam Vitals reviewed.  Constitutional:      Appearance: Normal appearance.  HENT:     Mouth/Throat:     Mouth: Mucous membranes are moist.     Pharynx: No oropharyngeal exudate.  Eyes:     Extraocular Movements: Extraocular movements intact.     Pupils: Pupils are equal, round, and reactive to light.  Cardiovascular:     Rate and Rhythm: Normal rate and regular rhythm.  Pulmonary:     Effort: Pulmonary effort is normal.  Abdominal:     General: Bowel sounds are normal. There is no distension.  Palpations: Abdomen is soft.  Musculoskeletal:     Cervical back: Normal range of motion and neck supple.     Right lower leg: No edema.     Left lower leg: No edema.  Neurological:     General: No focal deficit present.     Mental Status: He is alert.     Labs: Lab Results  Component Value Date   WBC 8.5 02/05/2022   HGB 15.8 02/05/2022   HCT 43.7 02/05/2022   MCV 103.3 (H) 02/05/2022   PLT 295 [id number]   Last metabolic panel Lab Results  Component Value Date   GLUCOSE 111 (H) 02/05/2022   NA 140 02/05/2022   K 3.5 02/05/2022   CL 101 02/05/2022   CO2 28 02/05/2022   BUN 10 02/05/2022   CREATININE 0.57 (L) 02/05/2022   EGFR 113 02/05/2022   CALCIUM 9.6 02/05/2022   PROT 8.1 02/05/2022   ALBUMIN 3.7 07/02/2019   BILITOT 0.5 02/05/2022   ALKPHOS 63 07/02/2019   AST 23 02/05/2022   ALT 29 02/05/2022   ANIONGAP 10 06/22/2021          Assessment &  Plan:  #hiv -Change to biktarvy -Labs 3 months -discussed u=u -encourage compliance  #m-abscessus #copd #pulm nodule #lul cavitary 07/2019 last sputum obtained. I don't have any sputum after that and that grew m-abscessus. His mac havent grown then.  He is still on M-abscessus treatment 01/2022 ct stable outside of 1 new nodule -- he still smokes so ?unclear etiology  We need sputum cx to see if we can stop his abx.  Continue linezolid, arykayce, and omadocycline.   F/u pulm about pulm nodule  Repeat chest ct in 6 months from now  I have spent a total of 40 minutes of face-to-face and non-face-to-face time, excluding clinical staff time, preparing to see patient, ordering tests and/or medications, and provide counseling the patient

## 2022-02-21 ENCOUNTER — Encounter: Payer: Self-pay | Admitting: Pulmonary Disease

## 2022-02-21 ENCOUNTER — Ambulatory Visit: Payer: Medicaid Other | Admitting: Pulmonary Disease

## 2022-02-21 VITALS — BP 132/84 | HR 93 | Ht 70.0 in | Wt 158.8 lb

## 2022-02-21 DIAGNOSIS — Z720 Tobacco use: Secondary | ICD-10-CM

## 2022-02-21 DIAGNOSIS — R918 Other nonspecific abnormal finding of lung field: Secondary | ICD-10-CM

## 2022-02-21 DIAGNOSIS — J984 Other disorders of lung: Secondary | ICD-10-CM

## 2022-02-21 DIAGNOSIS — J432 Centrilobular emphysema: Secondary | ICD-10-CM | POA: Diagnosis not present

## 2022-02-21 NOTE — Patient Instructions (Addendum)
We will repeat a CT scan in April to monitor the new nodule  Continue stiolto 2 puffs daily  Use albuterol inhaler 1-2 puffs every 4-6 hours as needed  Follow up in 2 months after your CT Chest scan

## 2022-02-21 NOTE — Progress Notes (Signed)
Synopsis: Referred in 2019 for cavitary lung lesion, COPD by Sue Lush, PA-C. Previously a patient of Drs. McQuaid, Icard and [facility].  Subjective:   PATIENT ID: Calvin Schroeder GENDER: male DOB: July 30, 1960, MRN: [id number]  HPI  Chief Complaint  Patient presents with   Follow-up   Calvin Schroeder is a 62 year old male, daily smoker with a history of HIV, cavitary pulmonary MAC and COPD who returns to pulmonary clinic for follow up.   He has been doing well since last visit. He continues on stiolto and as needed albuterol. He has come cough with mucous production.   He is following with ID. Recent CT chest shows stable cavitary lesions with a new left upper lobe nodule measuring up to 1.3 cm in diameter adjacent to the cavitary lesion.   No fevers chills or night sweats.   OV 07/20/21 PFTs today moderate obstruction and mild diffusion defect. He has significant decline in spirometry and lung volumes since his last set of PFTs in 2019.   He continues to smoke daily. He is using stiolto daily.   OV 02/21/21 He has been doing well since our last visit over 1 year ago. He is using stiolto inhaler as needed which is not often. He continues to smoke 1 pack of cigarettes daily. 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 He remains on linezolid, omadacycline and amikacin with no side effects for treatment of his MAC.   He is also following with CT Surgery regarding the cavitary lesions related to the MAC infection. Most recent CT Chest scan 12/2020 shows stable LUL cavitary lesion over the past year and stable nodules of the right lung over the past 2 years.  Past Medical History:  Diagnosis Date   Back pain    COPD (chronic obstructive pulmonary disease) (HCC)    emphysema    Dyspnea    with exertion    HIV (human immunodeficiency virus infection) ([place])    Lung mass 06/10/2013   MAI   Pneumonia    11/2016   Tobacco abuse 06/10/2013     Family History   Problem Relation Age of Onset   Cancer Mother        lung ca   Cancer Father        skin ca   Cancer Maternal Uncle        liver ca   Cancer Maternal Grandmother        bladder ca     Social History   Socioeconomic History   Marital status: Single    Spouse name: Not on file   Number of children: Not on file   Years of education: Not on file   Highest education level: Not on file  Occupational History   Not on file  Tobacco Use   Smoking status: Every Day    Packs/day: 1.00    Years: 40.00    Total pack years: 40.00    Types: Cigarettes   Smokeless tobacco: Never   Tobacco comments:    1/2 ppd as of 09/10/19  Vaping Use   Vaping Use: Never used  Substance and Sexual Activity   Alcohol use: Yes    Comment: daily - 3-4 alcohol   Drug use: Not Currently    Types: Marijuana    Comment: daily    Sexual activity: Not Currently    Partners: Female    Comment: refused  Other Topics Concern   Not on file  Social History  Narrative   Not on file   Social Determinants of Health   Financial Resource Strain: Not on file  Food Insecurity: Not on file  Transportation Needs: Not on file  Physical Activity: Not on file  Stress: Not on file  Social Connections: Not on file  Intimate Partner Violence: Not on file     Allergies  Allergen Reactions   Rifampin     Reports he could not function for 4 days  Other reaction(s): Other Reports he could not function for 4 days      Outpatient Medications Prior to Visit  Medication Sig Dispense Refill   albuterol (VENTOLIN HFA) 108 (90 Base) MCG/ACT inhaler Inhale 2 puffs into the lungs every 6 (six) hours as needed for wheezing or shortness of breath. 8 g 5   Amikacin Sulfate Liposome (ARIKAYCE) 590 MG/8.4ML SUSP Inhale 590 mg into the lungs daily. 235.2 mL 11   bictegravir-emtricitabine-tenofovir AF (BIKTARVY) 50-200-25 MG TABS tablet Take 1 tablet by mouth daily. 30 tablet 11   Cholecalciferol 25 MCG (1000 UT) capsule Take  1,000 Units by mouth daily.     folic acid (FOLVITE) 1 MG tablet Take 1 mg by mouth daily.     ipratropium-albuterol (DUONEB) 0.5-2.5 (3) MG/3ML SOLN Take 3 mLs by nebulization every 6 (six) hours as needed (shortness of breath/wheezing). 360 mL 5   linezolid (ZYVOX) 600 MG tablet Take 1 tablet by mouth daily. 30 tablet 5   losartan (COZAAR) 50 MG tablet Take 50 mg by mouth daily.     Misc. Devices KIT Flutter Valve 1 kit 0   Omadacycline Tosylate 150 MG TABS Take 2 tablets by mouth daily. 60 tablet 11   ondansetron (ZOFRAN ODT) 4 MG disintegrating tablet Take 1 tablet (4 mg total) by mouth every 8 (eight) hours as needed for nausea or vomiting. 20 tablet 0   Tiotropium Bromide-Olodaterol (STIOLTO RESPIMAT) 2.5-2.5 MCG/ACT AERS Inhale 2 puffs into the lungs daily. 4 g 11   No facility-administered medications prior to visit.   Review of Systems  Constitutional:  Negative for chills, diaphoresis, fever, malaise/fatigue and weight loss.  HENT:  Negative for congestion, sinus pain and sore throat.   Eyes: Negative.   Respiratory:  Positive for cough. Negative for hemoptysis, sputum production, shortness of breath and wheezing.   Cardiovascular:  Negative for chest pain, palpitations, orthopnea and leg swelling.  Gastrointestinal:  Negative for abdominal pain, heartburn, nausea and vomiting.  Genitourinary: Negative.   Musculoskeletal: Negative.   Skin:  Negative for rash.  Neurological: Negative.   Endo/Heme/Allergies: Negative.   Psychiatric/Behavioral: Negative.      Objective:   Vitals:   02/21/22 1325  BP: 132/84  Pulse: 93  SpO2: 96%  Weight: 158 lb 12.8 oz (72 kg)  Height: 5' 10"$  (1.778 m)    Physical Exam Constitutional:      General: He is not in acute distress.    Appearance: Normal appearance. He is normal weight.  HENT:     Head: Normocephalic and atraumatic.  Eyes:     General: No scleral icterus.    Conjunctiva/sclera: Conjunctivae normal.  Cardiovascular:      Rate and Rhythm: Normal rate.     Pulses: Normal pulses.     Heart sounds: Normal heart sounds.  Pulmonary:     Effort: Pulmonary effort is normal.     Breath sounds: Normal breath sounds.  Musculoskeletal:     Right lower leg: No edema.     Left lower leg:  No edema.  Neurological:     General: No focal deficit present.     Mental Status: He is alert.  Psychiatric:        Mood and Affect: Mood normal.        Behavior: Behavior normal.        Thought Content: Thought content normal.        Judgment: Judgment normal.    CBC    Component Value Date/Time   WBC 8.5 02/05/2022 1352   RBC 4.23 02/05/2022 1352   HGB 15.8 02/05/2022 1352   HGB 14.3 12/10/2016 1042   HCT 43.7 02/05/2022 1352   HCT 42.1 12/10/2016 1042   PLT 295 02/05/2022 1352   PLT 121 (L) 12/10/2016 1042   MCV 103.3 (H) 02/05/2022 1352   MCV 98.9 (H) 12/10/2016 1042   MCH 37.4 (H) 02/05/2022 1352   MCHC 36.2 (H) 02/05/2022 1352   RDW 13.2 02/05/2022 1352   RDW 12.6 12/10/2016 1042   LYMPHSABS 1,615 02/05/2022 1352   LYMPHSABS 0.7 (L) 12/10/2016 1042   MONOABS 0.8 06/21/2021 2000   MONOABS 0.4 12/10/2016 1042   EOSABS 60 02/05/2022 1352   EOSABS 0.1 12/10/2016 1042   BASOSABS 94 02/05/2022 1352   BASOSABS 0.0 12/10/2016 1042      Latest Ref Rng & Units 02/05/2022    1:52 PM 07/06/2021    1:55 AM 06/28/2021    2:42 PM  BMP  Glucose 65 - 99 mg/dL 111  111  103   BUN 7 - 25 mg/dL 10  6  7   $ Creatinine 0.70 - 1.30 mg/dL 0.57  0.84  0.73   BUN/Creat Ratio 6 - 22 (calc) 18  7  NOT APPLICABLE   Sodium A999333 - 146 mmol/L 140  138  141   Potassium 3.5 - 5.3 mmol/L 3.5  3.6  3.3   Chloride 98 - 110 mmol/L 101  102  100   CO2 20 - 32 mmol/L 28  25  29   $ Calcium 8.6 - 10.3 mg/dL 9.6  8.5  9.2    Chest imaging: CT Chest 01/29/22 1. The dominant cavitary left upper lobe lesion has not significantly changed, although there is a new adjacent nodular lesion in the left upper lobe measuring up to 1.3 cm in  diameter. This is favored to be inflammatory/postinflammatory, although neoplasm not entirely excluded. Suggest attention on shorter term follow-up CT (3-6 months). 2. No other changes or suspicious findings. Stable chronic right lung findings. No adenopathy or pleural effusion. 3. Hepatic steatosis. 4. Aortic Atherosclerosis (ICD10-I70.0) and Emphysema  CT Chest 12/2020 Severe pulmonary emphysema with paraseptal in centrilobular emphysematous changes.   Area with cavitation in the LEFT upper lobe with bandlike changes that extend both the pleural surface and the LEFT suprahilar region, associated with retraction of the major fissure anteriorly. Also similar when compared to the study of December 07, 2019, diminished in size compared to more remote imaging.   Additional areas of nodularity throughout the chest which are also similar to previous imaging with scattered areas of grouped nodularity throughout the chest without substantial change. Findings are compatible with chronic infection. Continued surveillance may be warranted given the spiculated nature of some of these areas particularly in the RIGHT upper lobe. Consider lung cancer screening if appropriate or 12 month follow-up as warranted.   Aortic atherosclerosis.   Aortic Atherosclerosis  CT Chest 12/07/19 1. Continued decrease in size of LEFT upper lobe cavitary area compatible with post infectious scarring  and cavitation. Diminished nodularity about this area compatible with post infectious or inflammatory changes. 2. Nodules in the RIGHT chest are stable to slightly diminished in size but within 1 mm of previous measurements. Continued attention on follow-up is suggested, stable since December 2020, shown to be cavitary in August of 2020 also likely post infectious but with limited change in size. 3. Emphysema and aortic atherosclerosis.  PFT:    Latest Ref Rng & Units 07/20/2021    2:59 PM 06/11/2017   10:40  AM  PFT Results  FVC-Pre L 3.16  3.63   FVC-Predicted Pre % 70  73   FVC-Post L 3.58    FVC-Predicted Post % 79    Pre FEV1/FVC % % 60  65   Post FEV1/FCV % % 61    FEV1-Pre L 1.89  2.34   FEV1-Predicted Pre % 55  61   FEV1-Post L 2.18    DLCO uncorrected ml/min/mmHg 16.43    DLCO UNC% % 62    DLCO corrected ml/min/mmHg 15.01    DLCO COR %Predicted % 57    DLVA Predicted % 65       Assessment & Plan:   Centrilobular emphysema (HCC)  Cavitary lesion of lung  Pulmonary nodules - Plan: CT Chest Wo Contrast  Tobacco use  Discussion: Calvin Schroeder is a 62 year old male, daily smoker with a history of HIV, cavitary pulmonary MAC and COPD who returns to pulmonary clinic for follow up.   He is doing well from a COPD standpoint and will continue on stiolto respimat daily along with as needed albuterol.   He has new pulmonary nodule which we will repeat a CT Chest scan in 3 months for close monitoring.   He is following with ID for the MAC infection and is to continue his regimen of arykace, omadacycline and zyvox. .  Follow up in 2 months after CT Chest.  Freda Jackson, MD [facility] Office: [phone]    Current Outpatient Medications:    albuterol (VENTOLIN HFA) 108 (90 Base) MCG/ACT inhaler, Inhale 2 puffs into the lungs every 6 (six) hours as needed for wheezing or shortness of breath., Disp: 8 g, Rfl: 5   Amikacin Sulfate Liposome (ARIKAYCE) 590 MG/8.4ML SUSP, Inhale 590 mg into the lungs daily., Disp: 235.2 mL, Rfl: 11   bictegravir-emtricitabine-tenofovir AF (BIKTARVY) 50-200-25 MG TABS tablet, Take 1 tablet by mouth daily., Disp: 30 tablet, Rfl: 11   Cholecalciferol 25 MCG (1000 UT) capsule, Take 1,000 Units by mouth daily., Disp: , Rfl:    folic acid (FOLVITE) 1 MG tablet, Take 1 mg by mouth daily., Disp: , Rfl:    ipratropium-albuterol (DUONEB) 0.5-2.5 (3) MG/3ML SOLN, Take 3 mLs by nebulization every 6 (six) hours as needed (shortness  of breath/wheezing)., Disp: 360 mL, Rfl: 5   linezolid (ZYVOX) 600 MG tablet, Take 1 tablet by mouth daily., Disp: 30 tablet, Rfl: 5   losartan (COZAAR) 50 MG tablet, Take 50 mg by mouth daily., Disp: , Rfl:    Misc. Devices KIT, Flutter Valve, Disp: 1 kit, Rfl: 0   Omadacycline Tosylate 150 MG TABS, Take 2 tablets by mouth daily., Disp: 60 tablet, Rfl: 11   ondansetron (ZOFRAN ODT) 4 MG disintegrating tablet, Take 1 tablet (4 mg total) by mouth every 8 (eight) hours as needed for nausea or vomiting., Disp: 20 tablet, Rfl: 0   Tiotropium Bromide-Olodaterol (STIOLTO RESPIMAT) 2.5-2.5 MCG/ACT AERS, Inhale 2 puffs into the lungs daily., Disp: 4 g,  Rfl: 11

## 2022-03-05 ENCOUNTER — Other Ambulatory Visit: Payer: Medicaid Other

## 2022-03-05 ENCOUNTER — Other Ambulatory Visit: Payer: Self-pay

## 2022-03-05 DIAGNOSIS — A318 Other mycobacterial infections: Secondary | ICD-10-CM

## 2022-03-11 ENCOUNTER — Other Ambulatory Visit: Payer: Medicaid Other

## 2022-03-11 ENCOUNTER — Other Ambulatory Visit: Payer: Self-pay

## 2022-03-11 DIAGNOSIS — A318 Other mycobacterial infections: Secondary | ICD-10-CM

## 2022-03-21 ENCOUNTER — Other Ambulatory Visit: Payer: Medicaid Other

## 2022-03-21 ENCOUNTER — Other Ambulatory Visit: Payer: Self-pay

## 2022-03-21 DIAGNOSIS — A318 Other mycobacterial infections: Secondary | ICD-10-CM

## 2022-04-01 ENCOUNTER — Other Ambulatory Visit: Payer: Self-pay

## 2022-04-01 ENCOUNTER — Encounter: Payer: Self-pay | Admitting: Internal Medicine

## 2022-04-01 ENCOUNTER — Ambulatory Visit: Payer: Medicaid Other | Admitting: Internal Medicine

## 2022-04-01 VITALS — BP 131/86 | HR 95 | Temp 98.7°F | Wt 152.0 lb

## 2022-04-01 DIAGNOSIS — Z2239 Carrier of other specified bacterial diseases: Secondary | ICD-10-CM

## 2022-04-01 NOTE — Progress Notes (Signed)
Subjective:    Patient ID: Calvin Schroeder, male  DOB: 27-Dec-1960, 62 y.o.        MRN: [id number]   HPI 62yo M previously found to have cystic lesion on chest CT on adm for PNA. 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 He was seen in ID 03-2017 was found to have AIDS. He was started on E/A/R however developed n/v (that persisted despite zofran)  Restarted MAI rx ~12-2017.    He was started on tivicay-descovy. He has had f/u with pulm for COPD.  He has also f/u with CVTS for a cyctic mass in LUL.  His last CVTS f/u was 06-2019- He had repeat CT at that time that showed: 1. The large thick-walled, cavitary left upper lobe lesion has not significantly changed in size from the most recent CT and is most consistent with postinflammatory scarring given relative stability for several years. 2. There is increased multifocal nodularity at the left apex superior to this cavitary lesion, which appears inflammatory. Continued follow-up in 6-12 months suggested. 3. Additional bilateral pulmonary nodules are stable. No adenopathy or pleural effusion. 4. Aortic Atherosclerosis (ICD10-I70.0) and Emphysema (ICD10-J43.9).   He had f/u appt with Pulm 07-09-19 and had repeat Cx showing M abscessus.       Mycobacterium abscessus complex  Amikacin 4.0 ug/mL Susceptible  Cefoxitin Comment  Comment: 32.0 ug/mL Intermediate  Ciprofloxacin >4.0 ug/mL Resistant  Clarithromycin >16.0 ug/mL Resistant  Comment: This organism has been evaluated for inducible macrolide resistance.  Doxycycline 16.0 ug/mL Resistant  Imipenem Comment  Comment: 8.0 ug/mL Intermediate  Linezolid Comment  Comment: 1.0 ug/mL or less, Susceptible  Minocycline >8.0 ug/mL Resistant  Moxifloxacin Comment  Comment: 2.0 ug/mL Intermediate  Tigecycline 0.25 ug/mL  Tobramycin CANCELED  Comment: Test not performed   Result canceled by the ancillary.  Trimethoprim/Sulfa Comment  Comment: >8/152 ug/mL Resistant    He has also been seen by Onc  for KS.   He was seen in ID and by pharm in August- his M abscessus therapy was changed to  clofazamine (which he has not been able to tolerate due to n/v), linezolid and omdacycline. He did not take.  September 2021, he was given arikace. He quit taking the clofazamine (diarrhea). He also took zyvox which made him nauseated. He did not start omadacycline, too scared. October 2021- not clear he was taking any of his M abscessus therapy. Reinforced need to take.  November 30-21 CT scan- 1. Continued decrease in size of LEFT upper lobe cavitary area compatible with post infectious scarring and cavitation. Diminishednodularity about this area compatible with post infectious or inflammatory changes. 2. Nodules in the RIGHT chest are stable to slightly diminished in size but within 1 mm of previous measurements. Continued attention on follow-up is suggested, stable since December 2020, shown to be cavitary in August of 2020 also likely post infectious but with limited change in size. 3. Emphysema and aortic atherosclerosis.   He has had intermittent adherence to his M abscesses rx (better with the arykace, difficult with timing of meds and meals).     He has had the neck mass removed 3 weeks prior. (12-27-19 [facility]- path- thyroglossal duct cyst).  Pending cervical fusion.    12-2020 he has f/u with CVTS, and  repeat CT scan following.  He is taking his M abscessus rx (since July 2022) (but he can't name them.  Severe pulmonary emphysema with paraseptal in centrilobular emphysematous changes.   Area with cavitation in the LEFT  upper lobe with bandlike changes that extend both the pleural surface and the LEFT suprahilar region, associated with retraction of the major fissure anteriorly. Also similar when compared to the study of December 07, 2019, diminished in size compared to more remote imaging.   Additional areas of nodularity throughout the chest which are also similar to previous imaging with  scattered areas of grouped nodularity throughout the chest without substantial change. Findings are compatible with chronic infection. Continued surveillance may be warranted given the spiculated nature of some of these areas particularly in the RIGHT upper lobe. Consider lung cancer screening if appropriate or 12 month follow-up as warranted.   No problems with DTGV/descovey.  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 Denies MSM activity.  Recently eval for hypoK after his blood work here. Given supplement. No diarrhea; has had regular emesis (dry heaves every morning). Doesn't feel like it comes from his stomach. Has noted since removal of thyroglossal duct cyst.  States he has been taking his M absccesus rx. States Dr [name] is not going to do another CT til Dec.  2-3 mixed drinks/day.   HIV 1 RNA Quant (Copies/mL)  Date Value  02/05/2022 <20 (H)  06/14/2021 <20 (H)  11/21/2020 Not Detected   CD4 T Cell Abs (/uL)  Date Value  02/05/2022 420  06/14/2021 344 (L)  11/21/2020 282 (L)   ------------------- 02/19/2022 id clinic f/u #hiv heterosexual Well controlled on tivicay/descovy. 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 He is currently on maintenance linezolid, omadocycline, and arikayce. No numbness/tingling, dyspnea. Has lots of nausea He gets sob coming back from the mailbox at his home as it sloped up. Not change in activity tolerance the past year No f/c/nightsweat  He has copd and uses inhalers. He does have lung doctors. His last pft 07/2021 showed fev1 55% decreased from 2019. He had afb sputum 07/30/19 that was negative but also positive test a few weeks prior to that  He still smokes 01/2022 chest ct stable lul cavitary lesions but new 1.3 cm nodule. He had seen pulmonology and plan to  repeat chest ct in 6 months    04/01/22 id clinic f/u Patient here for f/u sputum cx and ntm lung infection 2/27 afb sputum m-abscessus pending susceptibility 3/04 and 3/14 sputum growing afb not yet identified He ran out of linezolid, arykace, and omadocycline 09/2021 and hadn't filled it again As from previous visit, his respiratory/functional status has been stable He continues to smoke I would guesstimate his functional status is around MET 4-5   Health Maintenance  Topic Date Due   COVID-19 Vaccine (6 - 2023-24 season) 12/18/2021   Lung Cancer Screening  01/30/2023   DTaP/Tdap/Td (2 - Td or Tdap) 07/02/2028   COLONOSCOPY (Pts 45-69yrs Insurance coverage will need to be confirmed)  01/07/2029   INFLUENZA VACCINE  Completed   Hepatitis C Screening  Completed   HIV Screening  Completed   Zoster Vaccines- Shingrix  Completed   HPV VACCINES  Aged Out     Ros: All other ros negative      Objective:  Physical Exam Vitals reviewed.  Constitutional:      Appearance: Normal appearance.  HENT:     Mouth/Throat:     Mouth: Mucous membranes are moist.     Pharynx: No oropharyngeal exudate.  Eyes:     Extraocular Movements: Extraocular movements intact.     Pupils: Pupils are equal, round, and reactive to light.  Cardiovascular:     Rate and Rhythm: Normal rate and regular rhythm.  Pulmonary:     Effort: Pulmonary effort is normal.  Abdominal:     General: Bowel sounds are normal. There is no distension.     Palpations: Abdomen is soft.  Musculoskeletal:     Cervical back: Normal range of motion and neck supple.     Right lower leg: No edema.     Left lower leg: No edema.  Neurological:     General: No focal deficit present.     Mental Status: He is alert.     Labs: Lab Results  Component Value Date   WBC 8.5 02/05/2022   HGB 15.8 02/05/2022   HCT 43.7 02/05/2022   MCV 103.3 (H) 02/05/2022   PLT 295 [id number]   Last metabolic panel Lab Results  Component  Value Date   GLUCOSE 111 (H) 02/05/2022   NA 140 02/05/2022   K 3.5 02/05/2022   CL 101 02/05/2022   CO2 28 02/05/2022   BUN 10 02/05/2022   CREATININE 0.57 (L) 02/05/2022   EGFR 113 02/05/2022   CALCIUM 9.6 02/05/2022   PROT 8.1 02/05/2022   ALBUMIN 3.7 07/02/2019   BILITOT 0.5 02/05/2022   ALKPHOS 63 07/02/2019   AST 23 02/05/2022   ALT 29 02/05/2022   ANIONGAP 10 06/22/2021          Assessment & Plan:  #hiv Didn't discuss this visit 04/01/22  -Change to biktarvy -Labs 3 months -discussed u=u -encourage compliance  #m-abscessus #copd #pulm nodule #lul cavitary 07/2019 last sputum obtained. I don't have any sputum after that and that grew m-abscessus. His mac havent grown then.  He is still on M-abscessus treatment 01/2022 ct stable outside of 1 new nodule -- he still smokes so ?unclear etiology  We need sputum cx to see if we can stop his abx.  Continue linezolid, arykayce, and omadocycline (04/01/22 visit he now told me he wasn't taking these since 09/2021)  -as he had been taking m-abscessus meds for a few years and there continues to be present on sputum cx 02/2022 and no sx change. And ct relatively stable (outside of a new nodule). We mutually agree to continue holding treatment and just monitor -ct chest per pulm team within the next 3-4 months then f/u with me early summer 2024    I have spent a total of 30 minutes of face-to-face and non-face-to-face time, excluding clinical staff time, preparing to see patient, ordering tests and/or medications, and provide counseling the patient

## 2022-04-01 NOTE — Patient Instructions (Addendum)
Your sputum still grow at least m-abscessus. The mac I haven't seen again but the sputum is still cultivating   As you had stopped the m-abscessus treatment and you continue to feel stable, lets just watch and see how you do.   This infection is extremely difficult to treat and has very high relapse rate. And the medications for them have many side effects. And you were taken them for several years so let's just watch for now   See me in around 3-4 months from now, hope by then you have a repeat ct scan. We'll check hiv again at that time

## 2022-04-15 LAB — MYCOBACTERIA,CULT W/FLUOROCHROME SMEAR
MICRO NUMBER:: 14623823
SPECIMEN QUALITY:: ADEQUATE

## 2022-04-23 ENCOUNTER — Telehealth: Payer: Self-pay

## 2022-04-23 ENCOUNTER — Ambulatory Visit (HOSPITAL_COMMUNITY)
Admission: RE | Admit: 2022-04-23 | Discharge: 2022-04-23 | Disposition: A | Discharge status: Home / Self Care | Payer: Medicaid Other | Source: Ambulatory Visit | Attending: Pulmonary Disease | Admitting: Pulmonary Disease

## 2022-04-23 DIAGNOSIS — R918 Other nonspecific abnormal finding of lung field: Secondary | ICD-10-CM | POA: Insufficient documentation

## 2022-04-23 NOTE — Telephone Encounter (Signed)
-----   Message from Odette Fraction, MD sent at 04/23/2022  9:50 AM EDT ----- Sputum cx from 3/4 again grew M abscessus, followed by Dr Renold Don and appears tx on hold. Dr Renold Don to review cultures and see if appointment to see sooner needed when he comes back from [facility].   Wilkie Aye,  Could you also please request for sensi to [facility] ?

## 2022-04-28 ENCOUNTER — Other Ambulatory Visit: Payer: Self-pay | Admitting: Pulmonary Disease

## 2022-04-29 ENCOUNTER — Other Ambulatory Visit: Payer: Self-pay | Admitting: Pulmonary Disease

## 2022-05-06 LAB — MYCOBACTERIA,CULT W/FLUOROCHROME SMEAR
MICRO NUMBER:: 14696415
SMEAR:: NONE SEEN
SPECIMEN QUALITY:: ADEQUATE

## 2022-05-22 NOTE — Progress Notes (Signed)
Your CT scan overall remains stable with some improvement in the nodules.

## 2022-05-23 LAB — SUSCEPT, AFB RAPID
Amikacin: 8 ug/mL
CLOFAZIMINE: 0.12 ug/mL
Ceoxitin: 32 ug/mL
Ciprofloxacin: >4 ug/mL
Clarithromycin: >16 ug/mL
Doxycycline: >8 ug/mL
Imipenem: 16 ug/mL
Linezolid: 8 ug/mL
Moxifloxxacin: >4 ug/mL
Tigecycline: 0.25 ug/mL

## 2022-05-23 LAB — MYCOBACTERIA,CULT W/FLUOROCHROME SMEAR
MICRO NUMBER:: 14649404
SPECIMEN QUALITY:: ADEQUATE

## 2022-06-11 ENCOUNTER — Encounter: Payer: Self-pay | Admitting: Pulmonary Disease

## 2022-06-11 ENCOUNTER — Ambulatory Visit: Payer: Medicaid Other | Admitting: Pulmonary Disease

## 2022-06-11 VITALS — BP 116/74 | HR 86 | Ht 70.0 in | Wt 148.0 lb

## 2022-06-11 DIAGNOSIS — J984 Other disorders of lung: Secondary | ICD-10-CM | POA: Diagnosis not present

## 2022-06-11 DIAGNOSIS — J432 Centrilobular emphysema: Secondary | ICD-10-CM

## 2022-06-11 DIAGNOSIS — R918 Other nonspecific abnormal finding of lung field: Secondary | ICD-10-CM | POA: Diagnosis not present

## 2022-06-11 DIAGNOSIS — A310 Pulmonary mycobacterial infection: Secondary | ICD-10-CM | POA: Diagnosis not present

## 2022-06-11 MED ORDER — ALBUTEROL SULFATE HFA 108 (90 BASE) MCG/ACT IN AERS
2.0000 | INHALATION_SPRAY | Freq: Four times a day (QID) | RESPIRATORY_TRACT | 11 refills | Status: DC | PRN
Start: 2022-06-11 — End: 2023-08-29

## 2022-06-11 NOTE — Progress Notes (Signed)
Synopsis: Referred in 2019 for cavitary lung lesion, COPD by Nathaneil Canary, PA-C. Previously a patient of Drs. McQuaid, Icard and [employer].  Subjective:   PATIENT ID: Calvin Schroeder GENDER: male DOB: 1960-03-20, MRN: [id number]  HPI  Chief Complaint  Patient presents with   Follow-up    2 mo f/u for emphysema. States he has been more SOB for the past few days. Still has a productive cough with clear phlegm.    Calvin Schroeder is a 62 year old male, daily smoker with a history of HIV, cavitary pulmonary MAC and COPD who returns to pulmonary clinic for follow up.   He is smoking 1 pack per day. He reports his breathing has been stable on stiolto and as needed albuterol.  Repeat CT Chest scan April 2024 shows stable cavitary lesion and lung nodules.    OV 02/21/22 He has been doing well since last visit. He continues on stiolto and as needed albuterol. He has come cough with mucous production.   He is following with ID. Recent CT chest shows stable cavitary lesions with a new left upper lobe nodule measuring up to 1.3 cm in diameter adjacent to the cavitary lesion.   No fevers chills or night sweats.   OV 07/20/21 PFTs today moderate obstruction and mild diffusion defect. He has significant decline in spirometry and lung volumes since his last set of PFTs in 2019.   He continues to smoke daily. He is using stiolto daily.   OV 02/21/21 He has been doing well since our last visit over 1 year ago. He is using stiolto inhaler as needed which is not often. He continues to smoke 1 pack of cigarettes daily. 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 He remains on linezolid, omadacycline and amikacin with no side effects for treatment of his MAC.   He is also following with CT Surgery regarding the cavitary lesions related to the MAC infection. Most recent CT Chest scan 12/2020 shows stable LUL cavitary lesion over the past year and stable nodules of the right lung over the  past 2 years.  Past Medical History:  Diagnosis Date   Back pain    COPD (chronic obstructive pulmonary disease) (HCC)    emphysema    Dyspnea    with exertion    HIV (human immunodeficiency virus infection) (HCC)    Lung mass 06/10/2013   MAI   Pneumonia    11/2016   Tobacco abuse 06/10/2013     Family History  Problem Relation Age of Onset   Cancer Mother        lung ca   Cancer Father        skin ca   Cancer Maternal Uncle        liver ca   Cancer Maternal Grandmother        bladder ca     Social History   Socioeconomic History   Marital status: Single    Spouse name: Not on file   Number of children: Not on file   Years of education: Not on file   Highest education level: Not on file  Occupational History   Not on file  Tobacco Use   Smoking status: Every Day    Packs/day: 1.00    Years: 40.00    Additional pack years: 0.00    Total pack years: 40.00    Types: Cigarettes   Smokeless tobacco: Never   Tobacco comments:    1/2 ppd as  of 09/10/19  Vaping Use   Vaping Use: Never used  Substance and Sexual Activity   Alcohol use: Yes    Comment: daily - 3-4 alcohol   Drug use: Not Currently    Types: Marijuana    Comment: daily    Sexual activity: Not Currently    Partners: Female    Comment: refused  Other Topics Concern   Not on file  Social History Narrative   Not on file   Social Determinants of Health   Financial Resource Strain: Not on file  Food Insecurity: Not on file  Transportation Needs: Not on file  Physical Activity: Not on file  Stress: Not on file  Social Connections: Not on file  Intimate Partner Violence: Not on file     Allergies  Allergen Reactions   Rifampin     Reports he could not function for 4 days  Other reaction(s): Other Reports he could not function for 4 days      Outpatient Medications Prior to Visit  Medication Sig Dispense Refill   Amikacin Sulfate Liposome (ARIKAYCE) 590 MG/8.4ML SUSP Inhale 590 mg into  the lungs daily. 235.2 mL 11   bictegravir-emtricitabine-tenofovir AF (BIKTARVY) 50-200-25 MG TABS tablet Take 1 tablet by mouth daily. 30 tablet 11   Cholecalciferol 25 MCG (1000 UT) capsule Take 1,000 Units by mouth daily.     folic acid (FOLVITE) 1 MG tablet Take 1 mg by mouth daily.     ipratropium-albuterol (DUONEB) 0.5-2.5 (3) MG/3ML SOLN USE 3 ML VIA NEBULIZER EVERY 6 HOURS AS NEEDED FOR SHORTNESS OF BREATH OR WHEEZING 1080 mL 1   linezolid (ZYVOX) 600 MG tablet Take 1 tablet by mouth daily. 30 tablet 5   losartan (COZAAR) 50 MG tablet Take 50 mg by mouth daily.     Misc. Devices KIT Flutter Valve 1 kit 0   Omadacycline Tosylate 150 MG TABS Take 2 tablets by mouth daily. 60 tablet 11   ondansetron (ZOFRAN ODT) 4 MG disintegrating tablet Take 1 tablet (4 mg total) by mouth every 8 (eight) hours as needed for nausea or vomiting. 20 tablet 0   oxyCODONE-acetaminophen (PERCOCET) 10-325 MG tablet Take 1 tablet by mouth 4 (four) times daily as needed.     Tiotropium Bromide-Olodaterol (STIOLTO RESPIMAT) 2.5-2.5 MCG/ACT AERS Inhale 2 puffs into the lungs daily. 4 g 11   albuterol (VENTOLIN HFA) 108 (90 Base) MCG/ACT inhaler Inhale 2 puffs into the lungs every 6 (six) hours as needed for wheezing or shortness of breath. 8 g 5   No facility-administered medications prior to visit.   Review of Systems  Constitutional:  Negative for chills, diaphoresis, fever, malaise/fatigue and weight loss.  HENT:  Negative for congestion, sinus pain and sore throat.   Eyes: Negative.   Respiratory:  Negative for cough, hemoptysis, sputum production, shortness of breath and wheezing.   Cardiovascular:  Negative for chest pain, palpitations, orthopnea and leg swelling.  Gastrointestinal:  Negative for abdominal pain, heartburn, nausea and vomiting.  Genitourinary: Negative.   Musculoskeletal: Negative.   Skin:  Negative for rash.  Neurological: Negative.   Endo/Heme/Allergies: Negative.    Psychiatric/Behavioral: Negative.      Objective:   Vitals:   06/11/22 1516  BP: 116/74  Pulse: 86  SpO2: 98%  Weight: 148 lb (67.1 kg)  Height: 5\' 10"  (1.778 m)    Physical Exam Constitutional:      General: He is not in acute distress.    Appearance: Normal appearance. He is normal  weight.  HENT:     Head: Normocephalic and atraumatic.  Eyes:     General: No scleral icterus.    Conjunctiva/sclera: Conjunctivae normal.  Cardiovascular:     Rate and Rhythm: Normal rate.     Pulses: Normal pulses.     Heart sounds: Normal heart sounds.  Pulmonary:     Effort: Pulmonary effort is normal.     Breath sounds: Normal breath sounds.  Musculoskeletal:     Right lower leg: No edema.     Left lower leg: No edema.  Neurological:     General: No focal deficit present.     Mental Status: He is alert.    CBC    Component Value Date/Time   WBC 8.5 02/05/2022 1352   RBC 4.23 02/05/2022 1352   HGB 15.8 02/05/2022 1352   HGB 14.3 12/10/2016 1042   HCT 43.7 02/05/2022 1352   HCT 42.1 12/10/2016 1042   PLT 295 02/05/2022 1352   PLT 121 (L) 12/10/2016 1042   MCV 103.3 (H) 02/05/2022 1352   MCV 98.9 (H) 12/10/2016 1042   MCH 37.4 (H) 02/05/2022 1352   MCHC 36.2 (H) 02/05/2022 1352   RDW 13.2 02/05/2022 1352   RDW 12.6 12/10/2016 1042   LYMPHSABS 1,615 02/05/2022 1352   LYMPHSABS 0.7 (L) 12/10/2016 1042   MONOABS 0.8 06/21/2021 2000   MONOABS 0.4 12/10/2016 1042   EOSABS 60 02/05/2022 1352   EOSABS 0.1 12/10/2016 1042   BASOSABS 94 02/05/2022 1352   BASOSABS 0.0 12/10/2016 1042      Latest Ref Rng & Units 02/05/2022    1:52 PM 07/06/2021    1:55 AM 06/28/2021    2:42 PM  BMP  Glucose 65 - 99 mg/dL 161  096  045   BUN 7 - 25 mg/dL 10  6  7    Creatinine 0.70 - 1.30 mg/dL 4.09  8.11  9.14   BUN/Creat Ratio 6 - 22 (calc) 18  7  NOT APPLICABLE   Sodium 135 - 146 mmol/L 140  138  141   Potassium 3.5 - 5.3 mmol/L 3.5  3.6  3.3   Chloride 98 - 110 mmol/L 101  102  100    CO2 20 - 32 mmol/L 28  25  29    Calcium 8.6 - 10.3 mg/dL 9.6  8.5  9.2    Chest imaging: CT Chest 04/2022 1. No significant change in size of cavitary process within the left upper lobe. 2. The new nodular lesion described on the exam from 01/29/2022 has decreased in size from the previous exam current Calvin Schroeder measuring 6 mm versus 1.2 cm previously. This is compatible with a benign inflammatory/infectious process. 3. Additional bilateral pulmonary nodules are again noted. These are stable to mildly decreased in size when compared with previous exam. 4. Hepatic steatosis. 5. Coronary artery calcifications. 6.  Aortic Atherosclerosis  CT Chest 01/29/22 1. The dominant cavitary left upper lobe lesion has not significantly changed, although there is a new adjacent nodular lesion in the left upper lobe measuring up to 1.3 cm in diameter. This is favored to be inflammatory/postinflammatory, although neoplasm not entirely excluded. Suggest attention on shorter term follow-up CT (3-6 months). 2. No other changes or suspicious findings. Stable chronic right lung findings. No adenopathy or pleural effusion. 3. Hepatic steatosis. 4. Aortic Atherosclerosis (ICD10-I70.0) and Emphysema  CT Chest 12/2020 Severe pulmonary emphysema with paraseptal in centrilobular emphysematous changes.   Area with cavitation in the LEFT upper lobe with bandlike changes  that extend both the pleural surface and the LEFT suprahilar region, associated with retraction of the major fissure anteriorly. Also similar when compared to the study of December 07, 2019, diminished in size compared to more remote imaging.   Additional areas of nodularity throughout the chest which are also similar to previous imaging with scattered areas of grouped nodularity throughout the chest without substantial change. Findings are compatible with chronic infection. Continued surveillance may be warranted given the spiculated nature  of some of these areas particularly in the RIGHT upper lobe. Consider lung cancer screening if appropriate or 12 month follow-up as warranted.   Aortic atherosclerosis.   Aortic Atherosclerosis  CT Chest 12/07/19 1. Continued decrease in size of LEFT upper lobe cavitary area compatible with post infectious scarring and cavitation. Diminished nodularity about this area compatible with post infectious or inflammatory changes. 2. Nodules in the RIGHT chest are stable to slightly diminished in size but within 1 mm of previous measurements. Continued attention on follow-up is suggested, stable since December 2020, shown to be cavitary in August of 2020 also likely post infectious but with limited change in size. 3. Emphysema and aortic atherosclerosis.  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   He is doing well from a COPD standpoint and will continue on stiolto respimat daily along with as needed albuterol.   He has pulmonary nodules and LUL cavitary lesion which we will check a CT scan in 6 months. If remains stable will  check annually there after or refer to lung cancer screening program.   He has completed antibiotic therapy for his MAC infection with ID.   Follow up in 6 months after CT Chest.  Melody Comas, MD [facility] Office: [phone]    Current Outpatient Medications:    Amikacin Sulfate Liposome (ARIKAYCE) 590 MG/8.4ML SUSP, Inhale 590 mg into the lungs daily., Disp: 235.2 mL, Rfl: 11   bictegravir-emtricitabine-tenofovir AF (BIKTARVY) 50-200-25 MG TABS tablet, Take 1 tablet by mouth daily., Disp: 30 tablet, Rfl: 11   Cholecalciferol 25 MCG (1000 UT) capsule, Take 1,000 Units by mouth daily., Disp: , Rfl:    folic acid (FOLVITE) 1 MG tablet, Take 1 mg by mouth daily., Disp: , Rfl:    ipratropium-albuterol (DUONEB) 0.5-2.5 (3) MG/3ML SOLN, USE 3 ML VIA NEBULIZER EVERY 6 HOURS AS NEEDED  FOR SHORTNESS OF BREATH OR WHEEZING, Disp: 1080 mL, Rfl: 1   linezolid (ZYVOX) 600 MG tablet, Take 1 tablet by mouth daily., Disp: 30 tablet, Rfl: 5   losartan (COZAAR) 50 MG tablet, Take 50 mg by mouth daily., Disp: , Rfl:    Misc. Devices KIT, Flutter Valve, Disp: 1 kit, Rfl: 0   Omadacycline Tosylate 150 MG TABS, Take 2 tablets by mouth daily., Disp: 60 tablet, Rfl: 11   ondansetron (ZOFRAN ODT) 4 MG disintegrating tablet, Take 1 tablet (4 mg total) by mouth every 8 (eight) hours as needed for nausea or vomiting., Disp: 20 tablet, Rfl: 0   oxyCODONE-acetaminophen (PERCOCET) 10-325 MG tablet, Take 1 tablet by mouth 4 (four) times daily as needed., Disp: , Rfl:    Tiotropium Bromide-Olodaterol (STIOLTO RESPIMAT) 2.5-2.5 MCG/ACT AERS, Inhale 2 puffs into the lungs daily., Disp: 4 g, Rfl: 11   albuterol (VENTOLIN HFA) 108 (90 Base) MCG/ACT inhaler, Inhale 2 puffs into the lungs every 6 (six) hours as needed for wheezing or shortness of breath., Disp: 8 g, Rfl: 11

## 2022-06-11 NOTE — Patient Instructions (Addendum)
Your recent CT Chest scan looks stable  Continue stiolto 2 puffs daily and as needed albuterol inhaler.   Follow up in 6 months after CT Chest scan

## 2022-06-12 ENCOUNTER — Other Ambulatory Visit: Payer: Self-pay | Admitting: Pulmonary Disease

## 2022-06-17 NOTE — Progress Notes (Signed)
The 10-year ASCVD risk score (Arnett DK, et al., 2019) is: 8.7%   Values used to calculate the score:     Age: 62 years     Sex: Male     Is Non-Hispanic African American: No     Diabetic: No     Tobacco smoker: Yes     Systolic Blood Pressure: 116 mmHg     Is BP treated: Yes     HDL Cholesterol: 68 mg/dL     Total Cholesterol: 170 mg/dL  Sandie Ano, RN

## 2022-06-18 ENCOUNTER — Encounter: Payer: Self-pay | Admitting: Internal Medicine

## 2022-06-18 ENCOUNTER — Ambulatory Visit: Payer: Medicaid Other | Admitting: Internal Medicine

## 2022-06-18 ENCOUNTER — Other Ambulatory Visit: Payer: Self-pay

## 2022-06-18 VITALS — BP 144/89 | HR 75 | Temp 98.7°F | Wt 148.0 lb

## 2022-06-18 DIAGNOSIS — R110 Nausea: Secondary | ICD-10-CM

## 2022-06-18 DIAGNOSIS — B20 Human immunodeficiency virus [HIV] disease: Secondary | ICD-10-CM

## 2022-06-18 DIAGNOSIS — A318 Other mycobacterial infections: Secondary | ICD-10-CM

## 2022-06-18 DIAGNOSIS — J449 Chronic obstructive pulmonary disease, unspecified: Secondary | ICD-10-CM | POA: Diagnosis not present

## 2022-06-18 LAB — CBC WITH DIFFERENTIAL/PLATELET
Absolute Monocytes: 605 cells/uL (ref 200–950)
Basophils Relative: 1.9 %
Eosinophils Absolute: 120 cells/uL (ref 15–500)
Neutro Abs: 3364 cells/uL (ref 1500–7800)
Platelets: 215 10*3/uL (ref 140–400)

## 2022-06-18 MED ORDER — ONDANSETRON HCL 8 MG PO TABS: 8.0000 mg | ORAL_TABLET | Freq: Three times a day (TID) | ORAL | 5 refills | Status: AC | PRN

## 2022-06-18 MED ORDER — ATORVASTATIN CALCIUM 40 MG PO TABS: 40.0000 mg | ORAL_TABLET | Freq: Every day | ORAL | 11 refills | Status: DC

## 2022-06-18 NOTE — Progress Notes (Signed)
Subjective:    Patient ID: Calvin Schroeder, male  DOB: 27-Dec-1960, 62 y.o.        MRN: [id number]   HPI 62yo M previously found to have cystic lesion on chest CT on adm for PNA. 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 Calvin Schroeder was seen in ID [id number] was found to have AIDS. Calvin Schroeder was started on E/A/R however developed n/v (that persisted despite zofran)  Restarted MAI rx ~12-2017.    Calvin Schroeder was started on tivicay-descovy. Calvin Schroeder has had f/u with pulm for COPD.  Calvin Schroeder has also f/u with CVTS for a cyctic mass in LUL.  Calvin Schroeder last CVTS f/u was 06-2019- Calvin Schroeder had repeat CT at that time that showed: 1. The large thick-walled, cavitary left upper lobe lesion has not significantly changed in size from the most recent CT and is most consistent with postinflammatory scarring given relative stability for several years. 2. There is increased multifocal nodularity at the left apex superior to this cavitary lesion, which appears inflammatory. Continued follow-up in 6-12 months suggested. 3. Additional bilateral pulmonary nodules are stable. No adenopathy or pleural effusion. 4. Aortic Atherosclerosis (ICD10-I70.0) and Emphysema (ICD10-J43.9).   Calvin Schroeder had f/u appt with Pulm 07-09-19 and had repeat Cx showing M abscessus.       Mycobacterium abscessus complex  Amikacin 4.0 ug/mL Susceptible  Cefoxitin Comment  Comment: 32.0 ug/mL Intermediate  Ciprofloxacin >4.0 ug/mL Resistant  Clarithromycin >16.0 ug/mL Resistant  Comment: This organism has been evaluated for inducible macrolide resistance.  Doxycycline 16.0 ug/mL Resistant  Imipenem Comment  Comment: 8.0 ug/mL Intermediate  Linezolid Comment  Comment: 1.0 ug/mL or less, Susceptible  Minocycline >8.0 ug/mL Resistant  Moxifloxacin Comment  Comment: 2.0 ug/mL Intermediate  Tigecycline 0.25 ug/mL  Tobramycin CANCELED  Comment: Test not performed   Result canceled by the ancillary.  Trimethoprim/Sulfa Comment  Comment: >8/152 ug/mL Resistant    Calvin Schroeder has also been seen by Onc  for KS.   Calvin Schroeder was seen in ID and by pharm in August- Calvin Schroeder M abscessus therapy was changed to  clofazamine (which Calvin Schroeder has not been able to tolerate due to n/v), linezolid and omdacycline. Calvin Schroeder did not take.  September 2021, Calvin Schroeder was given arikace. Calvin Schroeder quit taking the clofazamine (diarrhea). Calvin Schroeder also took zyvox which made Calvin Schroeder nauseated. Calvin Schroeder did not start omadacycline, too scared. October 2021- not clear Calvin Schroeder was taking any of Calvin Schroeder M abscessus therapy. Reinforced need to take.  November 30-21 CT scan- 1. Continued decrease in size of LEFT upper lobe cavitary area compatible with post infectious scarring and cavitation. Diminishednodularity about this area compatible with post infectious or inflammatory changes. 2. Nodules in the RIGHT chest are stable to slightly diminished in size but within 1 mm of previous measurements. Continued attention on follow-up is suggested, stable since December 2020, shown to be cavitary in August of 2020 also likely post infectious but with limited change in size. 3. Emphysema and aortic atherosclerosis.   Calvin Schroeder has had intermittent adherence to Calvin Schroeder M abscesses rx (better with the arykace, difficult with timing of meds and meals).     Calvin Schroeder has had the neck mass removed 3 weeks prior. (12-27-19 [facility]- path- thyroglossal duct cyst).  Pending cervical fusion.    12-2020 Calvin Schroeder has f/u with CVTS, and  repeat CT scan following.  Calvin Schroeder is taking Calvin Schroeder M abscessus rx (since July 2022) (but Calvin Schroeder can't name them.  Severe pulmonary emphysema with paraseptal in centrilobular emphysematous changes.   Area with cavitation in the LEFT  upper lobe with bandlike changes that extend both the pleural surface and the LEFT suprahilar region, associated with retraction of the major fissure anteriorly. Also similar when compared to the study of December 07, 2019, diminished in size compared to more remote imaging.   Additional areas of nodularity throughout the chest which are also similar to previous imaging with  scattered areas of grouped nodularity throughout the chest without substantial change. Findings are compatible with chronic infection. Continued surveillance may be warranted given the spiculated nature of some of these areas particularly in the RIGHT upper lobe. Consider lung cancer screening if appropriate or 12 month follow-up as warranted.   No problems with DTGV/descovey.  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 Denies MSM activity.  Recently eval for hypoK after Calvin Schroeder blood work here. Given supplement. No diarrhea; has had regular emesis (dry heaves every morning). Doesn't feel like it comes from Calvin Schroeder stomach. Has noted since removal of thyroglossal duct cyst.  States Calvin Schroeder has been taking Calvin Schroeder M absccesus rx. States Dr Dorris Fetch is not going to do another CT til Dec.  2-3 mixed drinks/day.   HIV 1 RNA Quant (Copies/mL)  Date Value  02/05/2022 <20 (H)  06/14/2021 <20 (H)  11/21/2020 Not Detected   CD4 T Cell Abs (/uL)  Date Value  02/05/2022 420  06/14/2021 344 (L)  11/21/2020 282 (L)   ------------------- 02/19/2022 id clinic f/u #hiv heterosexual Well controlled on tivicay/descovy. 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 Calvin Schroeder is currently on maintenance linezolid, omadocycline, and arikayce. No numbness/tingling, dyspnea. Has lots of nausea Calvin Schroeder gets sob coming back from the mailbox at Calvin Schroeder home as it sloped up. Not change in activity tolerance the past year No f/c/nightsweat  Calvin Schroeder has copd and uses inhalers. Calvin Schroeder does have lung doctors. Calvin Schroeder last pft 07/2021 showed fev1 55% decreased from 2019. Calvin Schroeder had afb sputum 07/30/19 that was negative but also positive test a few weeks prior to that  Calvin Schroeder still smokes 01/2022 chest ct stable lul cavitary lesions but new 1.3 cm nodule. Calvin Schroeder had seen pulmonology and plan to  repeat chest ct in 6 months    04/01/22 id clinic f/u Patient here for f/u sputum cx and ntm lung infection 2/27 afb sputum m-abscessus pending susceptibility 3/04 and 3/14 sputum growing afb not yet identified Calvin Schroeder ran out of linezolid, arykace, and omadocycline 09/2021 and hadn't filled it again As from previous visit, Calvin Schroeder respiratory/functional status has been stable Calvin Schroeder continues to smoke I would guesstimate Calvin Schroeder functional status is around MET 4-5    06/18/22 id clinic visit Calvin Schroeder had a chest ct 04/23/22 for f/u hx m-abscessus lung disease. This showed stable disease Patient again confirms (as in 03/2022 visit) that Calvin Schroeder had stopped the omadocycline, arykace, and linezolid on Calvin Schroeder own.  Calvin Schroeder had continued to grow m-abscessus in Calvin Schroeder sputum as of 03/2022. Calvin Schroeder last sputum before that was in 2021 and Calvin Schroeder was growing m-abscessus as well Calvin Schroeder last grow mac in the sputum in 02/2017  Calvin Schroeder was on "mac treatment" either end of 2018 until 2021 --> no change in respiratory sx. Calvin Schroeder mac regimen include azithromycin, rifampin, and ethambutol, which Calvin Schroeder stopped the rifampin after a few doses due to side effect. In mid 2021 when abscessus was found (no mac) Calvin Schroeder ntm regimen was changed to arykace/clofazimine/omadocycline  and zyvox  Calvin Schroeder does have severe copd.  No change in health since last visit No missed dose of biktarvy last 4 weeks. Calvin Schroeder has been taking since 02/2022 id visit with me  Social -- staying with Calvin Schroeder former wife but they are legally separated   Health Maintenance  Topic Date Due   COVID-19 Vaccine (6 - 2023-24 season) 12/18/2021   INFLUENZA VACCINE  08/08/2022   Lung Cancer Screening  04/23/2023   DTaP/Tdap/Td (2 - Td or Tdap) 07/02/2028   Colonoscopy  01/07/2029   Hepatitis C Screening  Completed   HIV Screening  Completed   Zoster Vaccines- Shingrix  Completed   HPV VACCINES  Aged Out     Ros: All other ros negative      Objective:  Physical Exam Vitals reviewed.  Constitutional:       Appearance: Normal appearance.  HENT:     Mouth/Throat:     Mouth: Mucous membranes are moist.     Pharynx: No oropharyngeal exudate.  Eyes:     Extraocular Movements: Extraocular movements intact.     Pupils: Pupils are equal, round, and reactive to light.  Cardiovascular:     Rate and Rhythm: Normal rate and regular rhythm.  Pulmonary:     Effort: Pulmonary effort is normal.  Abdominal:     General: Bowel sounds are normal. There is no distension.     Palpations: Abdomen is soft.  Musculoskeletal:     Cervical back: Normal range of motion and neck supple.     Right lower leg: No edema.     Left lower leg: No edema.  Neurological:     General: No focal deficit present.     Mental Status: Calvin Schroeder is alert.     Labs: Lab Results  Component Value Date   WBC 8.5 02/05/2022   HGB 15.8 02/05/2022   HCT 43.7 02/05/2022   MCV 103.3 (H) 02/05/2022   PLT 295 02/05/2022   Last metabolic panel Lab Results  Component Value Date   GLUCOSE 111 (H) 02/05/2022   NA 140 02/05/2022   K 3.5 02/05/2022   CL 101 02/05/2022   CO2 28 02/05/2022   BUN 10 02/05/2022   CREATININE 0.57 (L) 02/05/2022   EGFR 113 02/05/2022   CALCIUM 9.6 02/05/2022   PROT 8.1 02/05/2022   ALBUMIN 3.7 07/02/2019   BILITOT 0.5 02/05/2022   ALKPHOS 63 07/02/2019   AST 23 02/05/2022   ALT 29 02/05/2022   ANIONGAP 10 06/22/2021    Imaging: Reviewed   04/23/22 chest ct 1. No significant change in size of cavitary process within the left upper lobe. 2. The new nodular lesion described on the exam from 01/29/2022 has decreased in size from the previous exam current Leigh measuring 6 mm versus 1.2 cm previously. This is compatible with a benign inflammatory/infectious process. 3. Additional bilateral pulmonary nodules are again noted. These are stable to mildly decreased in size when compared with previous exam. 4. Hepatic steatosis. 5. Coronary artery calcifications. 6.  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 Couldn't tolerate rifampin. 07/2019 sputum doesn't grow mac any more, but grew m-abscessus    #m-abscessus #copd #pulm nodule #lul cavitary 07/2019 last sputum obtained.  I don't have any sputum after that and that grew m-abscessus. Calvin Schroeder mac havent grown then.  M-abscessus tx 08/2019 until 09/2021 and per patient not consistently taking during those time 01/2022 ct stable outside of 1 new nodule -- Calvin Schroeder still smokes so ?unclear etiology 04/2022 ct stable and the nodule size slightly improved since 01/2022 Symptomatically stable functional status No fever/chill/weight loss for at least as Calvin Schroeder time with me since 02/2022. Minimal cough  At this time given persistent growth despite on more than 2 years of m-abscessus treatment, and stable ct and minimal sx, will continue to watch off antibiotics   -repeat ct scan chest in 3 months and get sputum again then; and reevaluate symptomatically and see if needing treatment   #nausea Gets from taking opioids  -refill Calvin Schroeder zofran    #hcm -vaccination Prevnar13 2019 Tdap 2020 Shingrix x2 shots by 05/2020 Meingococcal booster 08/2019 -hepatitis 2019 hep b sAb reactive, hep b cAb and sAg negative 2019 hep c ab negative -tb Will test next visit -std Deferred at Calvin Schroeder request -cancer screening Defer to pcp

## 2022-06-18 NOTE — Patient Instructions (Signed)
Please continue biktarvy   For your m-abscessus, will plan to repeat sputum culture and chest ct in 3-6 months. If your ct shows progressive disease and nothing else to explain it except m-abscessus keep growing on culture, and your symptoms are worse, then we'll restart antibiotics   Start taking lipitor 40 mg once at night to reduce risk of heart attack/stroke   See me 11/2022

## 2022-06-19 LAB — COMPLETE METABOLIC PANEL WITH GFR
AG Ratio: 1.3 (calc) (ref 1.0–2.5)
AST: 42 U/L — ABNORMAL HIGH (ref 10–35)
Albumin: 4.1 g/dL (ref 3.6–5.1)
Calcium: 9 mg/dL (ref 8.6–10.3)
Creat: 0.74 mg/dL (ref 0.70–1.30)

## 2022-06-21 LAB — COMPLETE METABOLIC PANEL WITH GFR
ALT: 53 U/L — ABNORMAL HIGH (ref 9–46)
Alkaline phosphatase (APISO): 81 U/L (ref 35–144)
BUN: 7 mg/dL (ref 7–25)
CO2: 29 mmol/L (ref 20–32)
Chloride: 106 mmol/L (ref 98–110)
Globulin: 3.1 g/dL (calc) (ref 1.9–3.7)
Glucose, Bld: 88 mg/dL (ref 65–99)
Potassium: 4.2 mmol/L (ref 3.5–5.3)
Sodium: 143 mmol/L (ref 135–146)
Total Bilirubin: 0.4 mg/dL (ref 0.2–1.2)
Total Protein: 7.2 g/dL (ref 6.1–8.1)
eGFR: 104 mL/min/{1.73_m2} (ref >=60)

## 2022-06-21 LAB — CBC WITH DIFFERENTIAL/PLATELET
Basophils Absolute: 120 cells/uL (ref 0–200)
Eosinophils Relative: 1.9 %
HCT: 47.5 % (ref 38.5–50.0)
Hemoglobin: 16.8 g/dL (ref 13.2–17.1)
Lymphs Abs: 2092 cells/uL (ref 850–3900)
MCH: 37.3 pg — ABNORMAL HIGH (ref 27.0–33.0)
MCHC: 35.4 g/dL (ref 32.0–36.0)
MCV: 105.6 fL — ABNORMAL HIGH (ref 80.0–100.0)
MPV: 9.5 fL (ref 7.5–12.5)
Monocytes Relative: 9.6 %
Neutrophils Relative %: 53.4 %
RBC: 4.5 10*6/uL (ref 4.20–5.80)
RDW: 13.1 % (ref 11.0–15.0)
Total Lymphocyte: 33.2 %
WBC: 6.3 10*3/uL (ref 3.8–10.8)

## 2022-06-21 LAB — HIV-1 RNA QUANT-NO REFLEX-BLD
HIV 1 RNA Quant: NOT DETECTED Copies/mL
HIV-1 RNA Quant, Log: NOT DETECTED Log cps/mL

## 2022-07-10 ENCOUNTER — Other Ambulatory Visit: Payer: Self-pay | Admitting: Pulmonary Disease

## 2022-07-10 DIAGNOSIS — J432 Centrilobular emphysema: Secondary | ICD-10-CM

## 2022-07-16 ENCOUNTER — Other Ambulatory Visit: Payer: Self-pay | Admitting: Thoracic Surgery (Cardiothoracic Vascular Surgery)

## 2022-07-16 DIAGNOSIS — R918 Other nonspecific abnormal finding of lung field: Secondary | ICD-10-CM

## 2022-08-27 ENCOUNTER — Ambulatory Visit
Admission: RE | Admit: 2022-08-27 | Discharge: 2022-08-27 | Disposition: A | Discharge status: Home / Self Care | Payer: Medicaid Other | Source: Ambulatory Visit | Attending: Thoracic Surgery (Cardiothoracic Vascular Surgery) | Admitting: Thoracic Surgery (Cardiothoracic Vascular Surgery)

## 2022-08-27 ENCOUNTER — Ambulatory Visit: Payer: Medicaid Other | Admitting: Thoracic Surgery (Cardiothoracic Vascular Surgery)

## 2022-08-27 DIAGNOSIS — R918 Other nonspecific abnormal finding of lung field: Secondary | ICD-10-CM

## 2022-09-02 ENCOUNTER — Encounter: Payer: Self-pay | Admitting: Thoracic Surgery (Cardiothoracic Vascular Surgery)

## 2022-09-02 ENCOUNTER — Ambulatory Visit: Payer: Medicaid Other | Admitting: Thoracic Surgery (Cardiothoracic Vascular Surgery)

## 2022-09-02 VITALS — BP 104/71 | HR 99 | Resp 18 | Ht 70.0 in | Wt 150.0 lb

## 2022-09-02 DIAGNOSIS — J984 Other disorders of lung: Secondary | ICD-10-CM | POA: Diagnosis not present

## 2022-09-02 NOTE — Progress Notes (Signed)
[address]       [place] [address]             [phone]     HPI: Mr. Calvin Schroeder returns for a scheduled follow-up visit  Calvin Schroeder is a 62 year old male with a history of tobacco abuse, COPD, pulmonary MAI, HIV, Kaposi's sarcoma, and chronic neck and back pain.  He was found to have a complex consolidative process in the left upper lobe with cavitation in 2018.  Bronchoscopy showed granulomas and he grew MAI on culture.  Has been treated by Dr. Renold Don of infectious disease and is also followed by Dr. Francine Graven of pulmonary.  I last saw him in the office in February.  On CT there was a new 1.2 cm left upper lobe nodule.  He now returns for follow-up with that.  In the interim he had a CT scan in April as well as the 1 more recently.  He feels well.  He is off treatment for MAI.  He says his breathing is better than it has been "in a while."  Continues to smoke.  Past Medical History:  Diagnosis Date   Back pain    COPD (chronic obstructive pulmonary disease) (HCC)    emphysema    Dyspnea    with exertion    HIV (human immunodeficiency virus infection) (HCC)    Lung mass 06/10/2013   MAI   Pneumonia    11/2016   Tobacco abuse 06/10/2013    Current Outpatient Medications  Medication Sig Dispense Refill   albuterol (VENTOLIN HFA) 108 (90 Base) MCG/ACT inhaler Inhale 2 puffs into the lungs every 6 (six) hours as needed for wheezing or shortness of breath. 8 g 11   atorvastatin (LIPITOR) 40 MG tablet Take 1 tablet (40 mg total) by mouth daily. 30 tablet 11   bictegravir-emtricitabine-tenofovir AF (BIKTARVY) 50-200-25 MG TABS tablet Take 1 tablet by mouth daily. 30 tablet 11   Cholecalciferol 25 MCG (1000 UT) capsule Take 1,000 Units by mouth daily.     folic acid (FOLVITE) 1 MG tablet Take 1 mg by mouth daily.     ipratropium-albuterol (DUONEB) 0.5-2.5 (3) MG/3ML SOLN USE 3 ML VIA NEBULIZER EVERY 6 HOURS AS NEEDED FOR SHORTNESS OF BREATH OR WHEEZING 1080 mL 1    losartan (COZAAR) 50 MG tablet Take 50 mg by mouth daily.     Misc. Devices KIT Flutter Valve 1 kit 0   ondansetron (ZOFRAN) 8 MG tablet Take 1 tablet (8 mg total) by mouth every 8 (eight) hours as needed for nausea or vomiting. 30 tablet 5   oxyCODONE-acetaminophen (PERCOCET) 10-325 MG tablet Take 1 tablet by mouth 4 (four) times daily as needed.     STIOLTO RESPIMAT 2.5-2.5 MCG/ACT AERS INHALE 2 PUFFS INTO THE LUNGS DAILY 4 g 11   No current facility-administered medications for this visit.    Physical Exam BP 104/71 (BP Location: Left Arm, Patient Position: Sitting)   Pulse 99   Resp 18   Ht 5\' 10"  (1.778 m)   Wt 150 lb (68 kg)   SpO2 96% Comment: RA  BMI 21.3 kg/m  62 year old gentleman appears older than stated age Alert and oriented x 3 with no focal deficits Lungs diminished bilaterally but otherwise clear, no wheezing Cardiac regular rate and rhythm  Diagnostic Tests: CT CHEST WITHOUT CONTRAST   TECHNIQUE: Multidetector CT imaging of the chest was performed following the standard protocol without IV contrast.   RADIATION DOSE  REDUCTION: This exam was performed according to the departmental dose-optimization program which includes automated exposure control, adjustment of the mA and/or kV according to patient size and/or use of iterative reconstruction technique.   COMPARISON:  04/23/2022   FINDINGS: Cardiovascular: Coronary, aortic arch, and branch vessel atherosclerotic vascular disease.   Mediastinum/Nodes: Unremarkable   Lungs/Pleura: Severe emphysema. Mild biapical pleuroparenchymal scarring.   Branching nodularity in the right upper lobe on image 71 series 8, probably from chronic mucus plugging, no substantial change from 06/16/2019, hence highly likely to be benign. Similar chronic branching nodularity posteriorly in the right upper lobe on image 84 of series 8, no change from 2021, likely benign. Branching nodularity in the right lower lobe on image  91 of series 8, no change over the last 3 years, most compatible with a benign process.   Prominent bandlike scarring in the left lung apex associated with a cavitary process. This cavitary process measures about 3.5 cm in short axis on image 42 series 8, previously measuring 4.5 cm in short axis on 06/16/2019 and 3.4 cm on 04/23/2022. The bandlike adjacent density includes volume loss, airway thickening, and airway plugging   Chronic nodularity in the left upper lobe as on image 59 series 8, not changed from 06/16/2019. Clustered nodularity medially in the left upper lobe as on image 89 series 8, likewise stable. Clustered lingular nodularity is mildly reduced compared to 06/16/2019. Mild left lower lobe nodularity is chronically stable.   Upper Abdomen: Diffuse hepatic steatosis.   Musculoskeletal: Bifida right anterior fourth rib. Healing fracture of the right anterior fifth rib on image 101 series 8. The appearance is suspicious for a subacute fracture superimposed on old deformity. The fracture was not present on 04/23/2022. Lower thoracic spondylosis.   IMPRESSION: 1. Chronic cavitary process in the left lung apex with adjacent bandlike scarring, airway thickening, and airway plugging. The cavitary process is slightly similar to 04/23/2022 and smaller than on 06/16/2019. 2. Other areas of chronic nodularity and branching nodularity bilaterally are stable from 06/16/2019, compatible with benign postinflammatory/infectious process. 3. Severe emphysema. 4. Diffuse hepatic steatosis. 5. Healing fracture of the right anterior fifth rib, probably subacute superimposed on old deformity.   Aortic Atherosclerosis (ICD10-I70.0) and Emphysema (ICD10-J43.9).     Electronically Signed   By: Gaylyn Rong M.D.   On: 08/27/2022 11:30   I personally reviewed the CT images.  Complex disease with severe emphysema and multiple areas of scarring, nodularity, and cavitary masses.   All the same or improved from January and stable from April.  Impression: Calvin Schroeder is a 61 year old male with a history of tobacco abuse, COPD, pulmonary MAI, HIV, Kaposi's sarcoma, and chronic neck and back pain.  He was found to have a complex consolidative process in the left upper lobe with cavitation in 2018.  Bronchoscopy showed granulomas and he grew MAI on culture.    Lung nodules/cavitary masses-all stable maybe for the first time in 5 years.  No new or suspicious masses.  At this point I think he can probably be followed annually through the low-dose lung cancer screening program.  Tobacco abuse-again recommended he stop smoking.  He does not seem interested.  MAC infection-stable off antibiotics   Plan: Follow-up with Dr. Francine Graven and Dr. Renold Don Refer to lung cancer screening program I will be happy to see Mr. Calvin Schroeder back anytime in the future if I can be of any further assistance with his care  Loreli Slot, MD [facility] (  [phone]     

## 2022-11-19 ENCOUNTER — Encounter: Payer: Self-pay | Admitting: Internal Medicine

## 2022-11-19 ENCOUNTER — Other Ambulatory Visit: Payer: Self-pay

## 2022-11-19 ENCOUNTER — Ambulatory Visit: Payer: Medicaid Other | Admitting: Internal Medicine

## 2022-11-19 VITALS — BP 128/87 | HR 91 | Temp 98.2°F | Ht 70.0 in | Wt 148.0 lb

## 2022-11-19 DIAGNOSIS — Z111 Encounter for screening for respiratory tuberculosis: Secondary | ICD-10-CM

## 2022-11-19 DIAGNOSIS — Z1159 Encounter for screening for other viral diseases: Secondary | ICD-10-CM

## 2022-11-19 DIAGNOSIS — A318 Other mycobacterial infections: Secondary | ICD-10-CM

## 2022-11-19 DIAGNOSIS — B20 Human immunodeficiency virus [HIV] disease: Secondary | ICD-10-CM

## 2022-11-19 NOTE — Patient Instructions (Signed)
Your m-abscessus is dong well without treatment   I agree you want to get RSV vaccine and annual flu/covid vaccine   Please stop smoking if you can   See me again in 6 months   Your hiv is well controlled

## 2022-11-19 NOTE — Progress Notes (Signed)
Subjective:    Patient ID: Calvin Schroeder, male  DOB: [birth year], 62 y.o.        MRN: [id number]   HPI 62yo M previously found to have cystic lesion on chest CT on adm for PNA. 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 He was seen in ID 03-2017 was found to have AIDS. He was started on E/A/R however developed n/v (that persisted despite zofran)  Restarted MAI rx ~12-2017.    He was started on tivicay-descovy. He has had f/u with pulm for COPD.  He has also f/u with CVTS for a cyctic mass in LUL.  His last CVTS f/u was 06-2019- He had repeat CT at that time that showed: 1. The large thick-walled, cavitary left upper lobe lesion has not significantly changed in size from the most recent CT and is most consistent with postinflammatory scarring given relative stability for several years. 2. There is increased multifocal nodularity at the left apex superior to this cavitary lesion, which appears inflammatory. Continued follow-up in 6-12 months suggested. 3. Additional bilateral pulmonary nodules are stable. No adenopathy or pleural effusion. 4. Aortic Atherosclerosis (ICD10-I70.0) and Emphysema (ICD10-J43.9).   He had f/u appt with Pulm 07-09-19 and had repeat Cx showing M abscessus.       Mycobacterium abscessus complex  Amikacin 4.0 ug/mL Susceptible  Cefoxitin Comment  Comment: 32.0 ug/mL Intermediate  Ciprofloxacin >4.0 ug/mL Resistant  Clarithromycin >16.0 ug/mL Resistant  Comment: This organism has been evaluated for inducible macrolide resistance.  Doxycycline 16.0 ug/mL Resistant  Imipenem Comment  Comment: 8.0 ug/mL Intermediate  Linezolid Comment  Comment: 1.0 ug/mL or less, Susceptible  Minocycline >8.0 ug/mL Resistant  Moxifloxacin Comment  Comment: 2.0 ug/mL Intermediate  Tigecycline 0.25 ug/mL  Tobramycin CANCELED  Comment: Test not performed   Result canceled by the ancillary.  Trimethoprim/Sulfa Comment  Comment: >8/152 ug/mL Resistant    He has also been seen by Onc  for KS.   He was seen in ID and by pharm in August- his M abscessus therapy was changed to  clofazamine (which he has not been able to tolerate due to n/v), linezolid and omdacycline. He did not take.  September 2021, he was given arikace. He quit taking the clofazamine (diarrhea). He also took zyvox which made him nauseated. He did not start omadacycline, too scared. October 2021- not clear he was taking any of his M abscessus therapy. Reinforced need to take.  November 30-21 CT scan- 1. Continued decrease in size of LEFT upper lobe cavitary area compatible with post infectious scarring and cavitation. Diminishednodularity about this area compatible with post infectious or inflammatory changes. 2. Nodules in the RIGHT chest are stable to slightly diminished in size but within 1 mm of previous measurements. Continued attention on follow-up is suggested, stable since December 2020, shown to be cavitary in August of 2020 also likely post infectious but with limited change in size. 3. Emphysema and aortic atherosclerosis.   He has had intermittent adherence to his M abscesses rx (better with the arykace, difficult with timing of meds and meals).     He has had the neck mass removed 3 weeks prior. (12-27-19 [facility]- path- thyroglossal duct cyst).  Pending cervical fusion.    12-2020 he has f/u with CVTS, and  repeat CT scan following.  He is taking his M abscessus rx (since July 2022) (but he can't name them.  Severe pulmonary emphysema with paraseptal in centrilobular emphysematous changes.   Area with cavitation in the LEFT  upper lobe with bandlike changes that extend both the pleural surface and the LEFT suprahilar region, associated with retraction of the major fissure anteriorly. Also similar when compared to the study of December 07, 2019, diminished in size compared to more remote imaging.   Additional areas of nodularity throughout the chest which are also similar to previous imaging with  scattered areas of grouped nodularity throughout the chest without substantial change. Findings are compatible with chronic infection. Continued surveillance may be warranted given the spiculated nature of some of these areas particularly in the RIGHT upper lobe. Consider lung cancer screening if appropriate or 12 month follow-up as warranted.   No problems with DTGV/descovey.  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 Denies MSM activity.  Recently eval for hypoK after his blood work here. Given supplement. No diarrhea; has had regular emesis (dry heaves every morning). Doesn't feel like it comes from his stomach. Has noted since removal of thyroglossal duct cyst.  States he has been taking his M absccesus rx. States Dr Dorris Fetch is not going to do another CT til Dec.  2-3 mixed drinks/day.   HIV 1 RNA Quant (Copies/mL)  Date Value  06/18/2022 Not Detected  02/05/2022 <20 (H)  06/14/2021 <20 (H)   CD4 T Cell Abs (/uL)  Date Value  02/05/2022 420  06/14/2021 344 (L)  11/21/2020 282 (L)   ------------------- 02/19/2022 id clinic f/u #hiv heterosexual Well controlled on tivicay/descovy. 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 He is currently on maintenance linezolid, omadocycline, and arikayce. No numbness/tingling, dyspnea. Has lots of nausea He gets sob coming back from the mailbox at his home as it sloped up. Not change in activity tolerance the past year No f/c/nightsweat  He has copd and uses inhalers. He does have lung doctors. His last pft 07/2021 showed fev1 55% decreased from 2019. He had afb sputum 07/30/19 that was negative but also positive test a few weeks prior to that  He still smokes 01/2022 chest ct stable lul cavitary lesions but new 1.3 cm nodule. He had seen pulmonology and plan to  repeat chest ct in 6 months    04/01/22 id clinic f/u Patient here for f/u sputum cx and ntm lung infection 2/27 afb sputum m-abscessus pending susceptibility 3/04 and 3/14 sputum growing afb not yet identified He ran out of linezolid, arykace, and omadocycline 09/2021 and hadn't filled it again As from previous visit, his respiratory/functional status has been stable He continues to smoke I would guesstimate his functional status is around MET 4-5    06/18/22 id clinic visit He had a chest ct 04/23/22 for f/u hx m-abscessus lung disease. This showed stable disease Patient again confirms (as in 03/2022 visit) that he had stopped the omadocycline, arykace, and linezolid on his own.  He had continued to grow m-abscessus in his sputum as of 03/2022. His last sputum before that was in 2021 and he was growing m-abscessus as well He last grow mac in the sputum in 02/2017  He was on "mac treatment" either end of 2018 until 2021 --> no change in respiratory sx. His mac regimen include azithromycin, rifampin, and ethambutol, which he stopped the rifampin after a few doses due to side effect. In mid 2021 when abscessus was found (no mac) his ntm regimen was changed to arykace/clofazimine/omadocycline  and zyvox  He does have severe copd.  No change in health since last visit No missed dose of biktarvy last 4 weeks. He has been taking since 02/2022 id visit with me  Social -- staying with his former wife but they are legally separated   11/19/22 id clinic f/u See a&p No missed dose biktarvy last 4 weeks Off m-abscessus tx since late 2023 and stable clinically Actually gaining weight Still smoking though No sob or coughing more the last 2 years No fever/chill Appetite is good Not sexually active - doesn't want std testing Has pcp and will see her soon for physical exam/cancer screening   Health Maintenance  Topic Date Due   INFLUENZA VACCINE  08/08/2022   COVID-19 Vaccine (6 - 2023-24  season) 09/08/2022   Lung Cancer Screening  08/27/2023   DTaP/Tdap/Td (2 - Td or Tdap) 07/02/2028   Colonoscopy  01/07/2029   Hepatitis C Screening  Completed   HIV Screening  Completed   Zoster Vaccines- Shingrix  Completed   HPV VACCINES  Aged Out     Ros: All other ros negative      Objective:  Physical Exam Vitals reviewed.  Constitutional:      Appearance: Normal appearance.  HENT:     Mouth/Throat:     Mouth: Mucous membranes are moist.     Pharynx: No oropharyngeal exudate.  Eyes:     Extraocular Movements: Extraocular movements intact.     Pupils: Pupils are equal, round, and reactive to light.  Cardiovascular:     Rate and Rhythm: Normal rate and regular rhythm.  Pulmonary:     Effort: Pulmonary effort is normal.  Abdominal:     General: Bowel sounds are normal. There is no distension.     Palpations: Abdomen is soft.  Musculoskeletal:     Cervical back: Normal range of motion and neck supple.     Right lower leg: No edema.     Left lower leg: No edema.  Neurological:     General: No focal deficit present.     Mental Status: He is alert.     Labs: Lab Results  Component Value Date   WBC 6.3 06/18/2022   HGB 16.8 06/18/2022   HCT 47.5 06/18/2022   MCV 105.6 (H) 06/18/2022   PLT 215 06/18/2022   Last metabolic panel Lab Results  Component Value Date   GLUCOSE 88 06/18/2022   NA 143 06/18/2022   K 4.2 06/18/2022   CL 106 06/18/2022   CO2 29 06/18/2022   BUN 7 06/18/2022   CREATININE 0.74 06/18/2022   EGFR 104 06/18/2022   CALCIUM 9.0 06/18/2022   PROT 7.2 06/18/2022   ALBUMIN 3.7 07/02/2019   BILITOT 0.4 06/18/2022   ALKPHOS 63 07/02/2019   AST 42 (H) 06/18/2022   ALT 53 (H) 06/18/2022   ANIONGAP 10 06/22/2021    Imaging: Reviewed   04/23/22 chest ct 1. No significant change in size of cavitary process within the left upper lobe. 2. The new nodular lesion described on the exam from 01/29/2022 has decreased in size from the  previous exam current Leigh measuring 6 mm versus 1.2 cm previously. This is compatible with a benign inflammatory/infectious process. 3. Additional bilateral pulmonary nodules are again noted. These are stable to mildly decreased in size when compared with previous exam. 4. Hepatic steatosis. 5. Coronary artery calcifications. 6.  Aortic Atherosclerosis    08/27/22 chest ct 1. Chronic cavitary process in the left lung apex with  adjacent bandlike scarring, airway thickening, and airway plugging. The cavitary process is slightly similar to 04/23/2022 and smaller than on 06/16/2019. 2. Other areas of chronic nodularity and branching nodularity bilaterally are stable from 06/16/2019, compatible with benign postinflammatory/infectious process. 3. Severe emphysema. 4. Diffuse hepatic steatosis. 5. Healing fracture of the right anterior fifth rib, probably subacute superimposed on old deformity.      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 Couldn't tolerate rifampin. 07/2019 sputum doesn't grow mac any more, but grew m-abscessus    #m-abscessus #copd #pulm nodule #lul cavitary 07/2019 last sputum obtained. I don't have any sputum after that and that grew m-abscessus. His mac havent grown then.  M-abscessus tx 08/2019 until 09/2021 and per patient not consistently taking during those time 01/2022 ct stable outside of 1 new nodule -- he still smokes so ?unclear etiology 04/2022 ct stable and the nodule size slightly improved since 01/2022 Symptomatically stable functional status No fever/chill/weight loss for at least as his time with me since 02/2022. Minimal cough  At this time given persistent growth despite on more than 2  years of m-abscessus treatment, and stable ct and minimal sx, will continue to watch off antibiotics  He mentioned previously last took any m-abscessus medication 09/2021 when he ran out   -08/2022 chest ct stable cavitary/scarring; clinically stable as well; no sign of b sx -continue observing off antibiotics for m-abscessus     #hcm -vaccination Prevnar13 2019 Tdap 2020 Shingrix x2 shots by 05/2020 Meingococcal booster 08/2019 Flu shot/covid shot this year 2024 Patient discussed rsv vaccine and I agree and he can get from primary care -hepatitis 2019 hep b sAb reactive, hep b cAb and sAg negative 2019 hep c ab negative -tb Will test next visit -std Deferred at his request -cancer screening Defer to pcp

## 2022-11-20 LAB — T-HELPER CELLS (CD4) COUNT (NOT AT ARMC)
CD4 % Helper T Cell: 30 % — ABNORMAL LOW (ref 33–65)
CD4 T Cell Abs: 490 /uL (ref 400–1790)

## 2022-11-21 LAB — HIV-1 RNA QUANT-NO REFLEX-BLD
HIV 1 RNA Quant: NOT DETECTED {copies}/mL
HIV-1 RNA Quant, Log: NOT DETECTED {Log_copies}/mL

## 2022-11-21 LAB — QUANTIFERON-TB GOLD PLUS
Mitogen-NIL: 6.81 [IU]/mL
NIL: 0.06 [IU]/mL
QuantiFERON-TB Gold Plus: NEGATIVE
TB1-NIL: 0 [IU]/mL
TB2-NIL: 0 [IU]/mL

## 2022-11-21 LAB — HEPATITIS B SURFACE ANTIBODY, QUANTITATIVE: Hep B S AB Quant (Post): 650 m[IU]/mL (ref >=10)

## 2022-12-03 ENCOUNTER — Ambulatory Visit: Payer: Medicaid Other | Admitting: Orthopaedic Surgery

## 2022-12-16 NOTE — Progress Notes (Unsigned)
Office Visit Note   Patient: Calvin Schroeder           Date of Birth: February 03, 1960           MRN: [id number] Visit Date: 12/17/2022              Requested by: Nathaneil Canary, PA-C [address] [place],  [place] [id number] PCP: Nathaneil Canary, PA-C   Assessment & Plan: Visit Diagnoses:  1. Chronic pain of both shoulders     Plan: Tauno is a 62 year old gentleman with bilateral shoulder pain.  No significant degenerative disease on x-rays.  We will set him up with bilateral shoulder injections with Dr. Shon Baton.  Patient wants to postpone physical therapy until a later time.  Follow-Up Instructions: No follow-ups on file.   Orders:  Orders Placed This Encounter  Procedures   XR Shoulder Left   XR Shoulder Right   AMB referral to sports medicine   No orders of the defined types were placed in this encounter.     Procedures: No procedures performed   Clinical Data: No additional findings.   Subjective: Chief Complaint  Patient presents with   Right Shoulder - Pain   Left Shoulder - Pain    HPI Calvin Schroeder is a 62 year old gentleman here for evaluation of bilateral shoulder pain for greater than 2 years.  Feels like it pops all the time but not necessarily with pain.  He states he has decreased strength and the pain wakes him up from sleeping.  Denies any radicular symptoms.  Currently unemployed. Review of Systems  Constitutional: Negative.   HENT: Negative.    Eyes: Negative.   Respiratory: Negative.    Cardiovascular: Negative.   Gastrointestinal: Negative.   Endocrine: Negative.   Genitourinary: Negative.   Skin: Negative.   Allergic/Immunologic: Negative.   Neurological: Negative.   Hematological: Negative.   Psychiatric/Behavioral: Negative.    All other systems reviewed and are negative.    Objective: Vital Signs: There were no vitals taken for this visit.  Physical Exam Vitals and nursing note reviewed.  Constitutional:       Appearance: He is well-developed.  HENT:     Head: Normocephalic and atraumatic.  Eyes:     Pupils: Pupils are equal, round, and reactive to light.  Pulmonary:     Effort: Pulmonary effort is normal.  Abdominal:     Palpations: Abdomen is soft.  Musculoskeletal:        General: Normal range of motion.     Cervical back: Neck supple.  Skin:    General: Skin is warm.  Neurological:     Mental Status: He is alert and oriented to person, place, and time.  Psychiatric:        Behavior: Behavior normal.        Thought Content: Thought content normal.        Judgment: Judgment normal.    Ortho Exam Exam of bilateral shoulder shows normal passive range of motion.  Slight weakness with empty can on the left side.  AC joint is nontender. Specialty Comments:  No specialty comments available.  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/12/2019   Medication management 09/02/2018   OSA (obstructive sleep apnea) 07/01/2018   Healthcare maintenance 03/30/2018   Sebaceous cyst 03/30/2018   HTN (hypertension) 03/30/2018   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   AIDS (acquired immune deficiency syndrome) (HCC) 04/09/2017   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   Hepatitis B immune 03/18/2017   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   Erythrocytosis 06/10/2013   Lung mass 06/10/2013   Tobacco abuse 06/10/2013   Past Medical History:  Diagnosis Date   Back pain    COPD (chronic obstructive pulmonary disease) (HCC)    emphysema    Dyspnea    with exertion    HIV (human immunodeficiency virus infection) (HCC)    Lung mass 06/10/2013    MAI   Pneumonia    11/2016   Tobacco abuse 06/10/2013    Family History  Problem Relation Age of Onset   Cancer Mother        lung ca   Cancer Father        skin ca   Cancer Maternal Uncle        liver ca   Cancer Maternal Grandmother        bladder ca    Past Surgical History:  Procedure Laterality Date   APPENDECTOMY     SHOULDER SURGERY     VIDEO BRONCHOSCOPY WITH ENDOBRONCHIAL NAVIGATION N/A 02/07/2017   Procedure: VIDEO BRONCHOSCOPY WITH ENDOBRONCHIAL NAVIGATION;  Surgeon: Loreli Slot, MD;  Location: [facility] OR;  Service: Thoracic;  Laterality: N/A;   VIDEO BRONCHOSCOPY WITH ENDOBRONCHIAL ULTRASOUND N/A 02/07/2017   Procedure: VIDEO BRONCHOSCOPY WITH ENDOBRONCHIAL ULTRASOUND;  Surgeon: Loreli Slot, MD;  Location: [facility] OR;  Service: Thoracic;  Laterality: N/A;   Social History   Occupational History   Not on file  Tobacco Use   Smoking status: Every Day    Current packs/day: 1.00    Average packs/day: 1 pack/day for 40.0 years (40.0 ttl pk-yrs)    Types: Cigarettes   Smokeless tobacco: Never   Tobacco comments:    1/2 ppd as of 09/10/19  Vaping Use   Vaping status: Never Used  Substance and Sexual Activity   Alcohol use: Yes    Comment: daily - 3-4 alcohol   Drug use: Not Currently    Types: Marijuana    Comment: daily    Sexual activity: Not Currently    Partners: Female    Comment: refused

## 2022-12-17 ENCOUNTER — Other Ambulatory Visit (INDEPENDENT_AMBULATORY_CARE_PROVIDER_SITE_OTHER): Payer: Medicaid Other

## 2022-12-17 ENCOUNTER — Ambulatory Visit (INDEPENDENT_AMBULATORY_CARE_PROVIDER_SITE_OTHER): Payer: Medicaid Other | Admitting: Orthopaedic Surgery

## 2022-12-17 DIAGNOSIS — M25511 Pain in right shoulder: Secondary | ICD-10-CM | POA: Diagnosis not present

## 2022-12-17 DIAGNOSIS — M25512 Pain in left shoulder: Secondary | ICD-10-CM | POA: Diagnosis not present

## 2022-12-17 DIAGNOSIS — G8929 Other chronic pain: Secondary | ICD-10-CM | POA: Diagnosis not present

## 2022-12-19 ENCOUNTER — Ambulatory Visit (HOSPITAL_COMMUNITY): Payer: Medicaid Other

## 2022-12-26 ENCOUNTER — Ambulatory Visit (HOSPITAL_COMMUNITY): Payer: Medicaid Other

## 2023-01-07 ENCOUNTER — Encounter: Payer: Self-pay | Admitting: Sports Medicine

## 2023-01-07 ENCOUNTER — Ambulatory Visit (INDEPENDENT_AMBULATORY_CARE_PROVIDER_SITE_OTHER): Payer: Medicaid Other | Admitting: Sports Medicine

## 2023-01-07 ENCOUNTER — Other Ambulatory Visit: Payer: Self-pay

## 2023-01-07 DIAGNOSIS — M25512 Pain in left shoulder: Secondary | ICD-10-CM | POA: Diagnosis not present

## 2023-01-07 DIAGNOSIS — M25511 Pain in right shoulder: Secondary | ICD-10-CM

## 2023-01-07 DIAGNOSIS — G8929 Other chronic pain: Secondary | ICD-10-CM | POA: Diagnosis not present

## 2023-01-07 MED ORDER — BUPIVACAINE HCL 0.25 % IJ SOLN
2.0000 mL | INTRAMUSCULAR | Status: AC | PRN
Start: 2023-01-07 — End: 2023-01-07
  Administered 2023-01-07: 2 mL via INTRA_ARTICULAR

## 2023-01-07 MED ORDER — METHYLPREDNISOLONE ACETATE 40 MG/ML IJ SUSP
40.0000 mg | INTRAMUSCULAR | Status: AC | PRN
Start: 2023-01-07 — End: 2023-01-07
  Administered 2023-01-07: 40 mg via INTRA_ARTICULAR

## 2023-01-07 MED ORDER — LIDOCAINE HCL 1 % IJ SOLN
2.0000 mL | INTRAMUSCULAR | Status: AC | PRN
Start: 2023-01-07 — End: 2023-01-07
  Administered 2023-01-07: 2 mL

## 2023-01-07 NOTE — Progress Notes (Signed)
   Procedure Note  Patient: Calvin Schroeder             Date of Birth: [birth year]           MRN: [id number]             Visit Date: 01/07/2023  Procedures: Visit Diagnoses:  1. Chronic pain of both shoulders    Large Joint Inj: R glenohumeral on 01/07/2023 1:57 PM Indications: pain Details: 22 G 3.5 in needle, ultrasound-guided posterior approach Medications: 2 mL lidocaine  1 %; 2 mL bupivacaine  0.25 %; 40 mg methylPREDNISolone  acetate 40 MG/ML Outcome: tolerated well, no immediate complications  US -guided glenohumeral joint injection, right shoulder After discussion on risks/benefits/indications, informed verbal consent was obtained. A timeout was then performed. The patient was positioned lying lateral recumbent on examination table. The patient's shoulder was prepped with betadine and multiple alcohol swabs and utilizing ultrasound guidance, the patient's glenohumeral joint was identified on ultrasound. Using ultrasound guidance a 22-gauge, 3.5 inch needle with a mixture of 2:2:1 cc's lidocaine :bupivicaine:depomedrol was directed from a lateral to medial direction via in-plane technique into the glenohumeral joint with visualization of appropriate spread of injectate into the joint. Patient tolerated the procedure well without immediate complications.      Procedure, treatment alternatives, risks and benefits explained, specific risks discussed. Consent was given by the patient. Immediately prior to procedure a time out was called to verify the correct patient, procedure, equipment, support staff and site/side marked as required. Patient was prepped and draped in the usual sterile fashion.    Large Joint Inj: L glenohumeral on 01/07/2023 1:57 PM Indications: pain Details: 22 G 3.5 in needle, ultrasound-guided posterior approach Medications: 2 mL lidocaine  1 %; 2 mL bupivacaine  0.25 %; 40 mg methylPREDNISolone  acetate 40 MG/ML Outcome: tolerated well, no immediate  complications  US -guided glenohumeral joint injection, left shoulder After discussion on risks/benefits/indications, informed verbal consent was obtained. A timeout was then performed. The patient was positioned lying lateral recumbent on examination table. The patient's shoulder was prepped with betadine and multiple alcohol swabs and utilizing ultrasound guidance, the patient's glenohumeral joint was identified on ultrasound. Using ultrasound guidance a 22-gauge, 3.5 inch needle with a mixture of 2:2:1 cc's lidocaine :bupivicaine:depomedrol was directed from a lateral to medial direction via in-plane technique into the glenohumeral joint with visualization of appropriate spread of injectate into the joint. Patient tolerated the procedure well without immediate complications.      Procedure, treatment alternatives, risks and benefits explained, specific risks discussed. Consent was given by the patient. Immediately prior to procedure a time out was called to verify the correct patient, procedure, equipment, support staff and site/side marked as required. Patient was prepped and draped in the usual sterile fashion.     - I evaluated the patient about 5 minutes post-injection and he already had improvement in pain and range of motion - follow-up with Dr. Jerri as indicated; I am happy to see them as needed  Lonell Sprang, DO Primary Care Sports Medicine Physician  [facility] - Orthoped[facility]  This note was dictated using Dragon naturally speaking software and may contain errors in syntax, spelling, or content which have not been identified prior to signing this note.

## 2023-01-09 ENCOUNTER — Ambulatory Visit (HOSPITAL_COMMUNITY)
Admission: RE | Admit: 2023-01-09 | Discharge: 2023-01-09 | Disposition: A | Discharge status: Home / Self Care | Payer: Medicaid Other | Source: Ambulatory Visit | Attending: Pulmonary Disease | Admitting: Pulmonary Disease

## 2023-01-09 DIAGNOSIS — R918 Other nonspecific abnormal finding of lung field: Secondary | ICD-10-CM | POA: Insufficient documentation

## 2023-01-09 DIAGNOSIS — J984 Other disorders of lung: Secondary | ICD-10-CM | POA: Insufficient documentation

## 2023-01-21 ENCOUNTER — Encounter (HOSPITAL_COMMUNITY): Payer: Self-pay

## 2023-01-21 ENCOUNTER — Ambulatory Visit (HOSPITAL_COMMUNITY)
Admission: EM | Admit: 2023-01-21 | Discharge: 2023-01-21 | Disposition: A | Discharge status: Home / Self Care | Payer: Medicaid Other | Attending: Physician Assistant | Admitting: Physician Assistant

## 2023-01-21 DIAGNOSIS — R079 Chest pain, unspecified: Secondary | ICD-10-CM | POA: Diagnosis not present

## 2023-01-21 MED ORDER — ASPIRIN 81 MG PO CHEW
324.0000 mg | CHEWABLE_TABLET | Freq: Once | ORAL | Status: AC
  Administered 2023-01-21: 324 mg via ORAL

## 2023-01-21 MED ORDER — ASPIRIN 81 MG PO CHEW
CHEWABLE_TABLET | ORAL | Status: AC
  Filled 2023-01-21: qty 4

## 2023-01-21 NOTE — ED Notes (Signed)
 Patient is being discharged from the Urgent Care and sent to the Emergency Department via self . Per provider, patient is in need of higher level of care due to chest pain. Patient is aware and verbalizes understanding of plan of care.  Vitals:   01/21/23 0815  BP: (!) 170/107  Pulse: (!) 111  Resp: 18  Temp: 98.1 F (36.7 C)  SpO2: 94%

## 2023-01-21 NOTE — ED Provider Notes (Signed)
 [facility]    CSN: [id number] Arrival date & time: 01/21/23  0800      History   Chief Complaint Chief Complaint  Patient presents with   Chest Pain    HPI Calvin Schroeder is a 63 y.o. male.   Pt complains of chest pain.  Patient reports that he had approximately 7 episodes last p.m.  Reports last episode happened at 5 AM he describes a sharp pain in his left chest.  Patient reports it last for seconds and then is gone.  Patient denies any fever or chills he denies any cough.  Patient admits to being concerned about his heart.  He denies any past medical history of coronary artery disease.  Or seen a cardiologist or had anything other than an EKG.  The history is provided by the patient. No language interpreter was used.  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   Medication management 09/02/2018   OSA (obstructive sleep apnea) 07/01/2018   Healthcare maintenance 03/30/2018   Sebaceous cyst 03/30/2018   HTN (hypertension) 03/30/2018   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   AIDS (acquired immune deficiency syndrome) (HCC) 04/09/2017   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   Hepatitis B immune 03/18/2017   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   Erythrocytosis 06/10/2013   Lung mass 06/10/2013   Tobacco abuse 06/10/2013    Past Surgical History:  Procedure Laterality Date   APPENDECTOMY     SHOULDER SURGERY     VIDEO BRONCHOSCOPY WITH ENDOBRONCHIAL NAVIGATION N/A 02/07/2017   Procedure: VIDEO BRONCHOSCOPY WITH ENDOBRONCHIAL NAVIGATION;   Surgeon: Kerrin Elspeth BROCKS, MD;  Location: [facility];  Service: Thoracic;  Laterality: N/A;   VIDEO BRONCHOSCOPY WITH ENDOBRONCHIAL ULTRASOUND N/A 02/07/2017   Procedure: VIDEO BRONCHOSCOPY WITH ENDOBRONCHIAL ULTRASOUND;  Surgeon: Kerrin Elspeth BROCKS, MD;  Location: [facility];  Service: Thoracic;  Laterality: N/A;       Home Medications    Prior to Admission medications   Medication Sig Start Date End Date Taking? Authorizing Provider  albuterol  (VENTOLIN  HFA) 108 (90 Base) MCG/ACT inhaler Inhale 2 puffs into the lungs every 6 (six) hours as needed for wheezing or shortness of breath. 06/11/22   Kara Dorn NOVAK, MD  atorvastatin  (LIPITOR) 40 MG tablet Take 1 tablet (40 mg total) by mouth daily. 06/18/22 06/13/23  Vu, Constance DASEN, MD  bictegravir-emtricitabine -tenofovir  AF (BIKTARVY) 50-200-25 MG TABS tablet Take 1 tablet by mouth daily. 02/19/22 02/14/23  [place] Constance DASEN, MD  Cholecalciferol 25 MCG (1000 UT) capsule Take 1,000 Units by mouth daily. 06/30/19   [provider]  folic acid  (FOLVITE ) 1 MG tablet Take 1 mg by mouth daily. 02/13/22   [provider]  ipratropium-albuterol  (DUONEB) 0.5-2.5 (3) MG/3ML SOLN USE 3 ML VIA NEBULIZER EVERY 6 HOURS AS NEEDED FOR SHORTNESS OF BREATH OR WHEEZING 06/13/22   Kara Dorn NOVAK, MD  losartan (COZAAR) 50 MG tablet Take 50 mg by mouth daily.    [provider]  Misc. Devices  KIT Flutter Valve 06/25/19   Icard, Adine L, DO  ondansetron  (ZOFRAN ) 8 MG tablet Take 1 tablet (8 mg total) by mouth every 8 (eight) hours as needed for nausea or vomiting. 06/18/22   Vu, Constance DASEN, MD  oxyCODONE -acetaminophen  (PERCOCET) 10-325 MG tablet Take 1 tablet by mouth 4 (four) times daily as needed. 03/25/22   [provider]  STIOLTO RESPIMAT  2.5-2.5 MCG/ACT AERS INHALE 2 PUFFS INTO THE LUNGS DAILY 07/10/22   Kara Dorn NOVAK, MD    Family History Family History  Problem Relation Age of Onset   Cancer Mother        lung ca   Cancer Father         skin ca   Cancer Maternal Uncle        liver ca   Cancer Maternal Grandmother        bladder ca    Social History Social History   Tobacco Use   Smoking status: Every Day    Current packs/day: 1.00    Average packs/day: 1 pack/day for 40.0 years (40.0 ttl pk-yrs)    Types: Cigarettes   Smokeless tobacco: Never   Tobacco comments:    1/2 ppd as of 09/10/19  Vaping Use   Vaping status: Never Used  Substance Use Topics   Alcohol use: Yes    Comment: daily - 3-4 alcohol   Drug use: Not Currently    Types: Marijuana    Comment: daily      Allergies   Rifampin    Review of Systems Review of Systems  Cardiovascular:  Positive for chest pain.  All other systems reviewed and are negative.    Physical Exam Triage Vital Signs ED Triage Vitals  Encounter Vitals Group     BP 01/21/23 0815 (!) 170/107     Systolic BP Percentile --      Diastolic BP Percentile --      Pulse Rate 01/21/23 0815 (!) 111     Resp 01/21/23 0815 18     Temp 01/21/23 0815 98.1 F (36.7 C)     Temp Source 01/21/23 0815 Oral     SpO2 01/21/23 0815 94 %     Weight 01/21/23 0816 160 lb (72.6 kg)     Height 01/21/23 0816 5' 10 (1.778 m)     Head Circumference --      Peak Flow --      Pain Score 01/21/23 0817 10     Pain Loc --      Pain Education --      Exclude from Growth Chart --    No data found.  Updated Vital Signs BP (!) 170/107 (BP Location: Right Arm)   Pulse (!) 111   Temp 98.1 F (36.7 C) (Oral)   Resp 18   Ht 5' 10 (1.778 m)   Wt 72.6 kg   SpO2 94%   BMI 22.96 kg/m   Visual Acuity Right Eye Distance:   Left Eye Distance:   Bilateral Distance:    Right Eye Near:   Left Eye Near:    Bilateral Near:     Physical Exam Vitals reviewed.  Constitutional:      Appearance: He is well-developed.  Cardiovascular:     Rate and Rhythm: Normal rate and regular rhythm.     Heart sounds: Normal heart sounds.  Pulmonary:     Effort: Pulmonary effort is normal.   Musculoskeletal:        General: Normal range of motion.  Cervical back: Normal range of motion and neck supple.  Skin:    General: Skin is warm.  Neurological:     Mental Status: He is alert.      UC Treatments / Results  Labs (all labs ordered are listed, but only abnormal results are displayed) Labs Reviewed - No data to display  EKG   Radiology No results found.  Procedures Procedures (including critical care time)  Medications Ordered in UC Medications  aspirin  chewable tablet 324 mg (has no administration in time range)    Initial Impression / Assessment and Plan / UC Course  I have reviewed the triage vital signs and the nursing notes.  Pertinent labs & imaging results that were available during my care of the patient were reviewed by me and considered in my medical decision making (see chart for details).     EKG  no acute change.  Pt has multiple medical problems.  Pt given asa.  He is currently pain free.  I advised him to go to the Emergency department.  Pt should have troponin testing.  Final Clinical Impressions(s) / UC Diagnoses   Final diagnoses:  Nonspecific chest pain   Discharge Instructions   None    ED Prescriptions   None    PDMP not reviewed this encounter.       Flint Sonny POUR, PA-C 01/21/23 [phone]

## 2023-01-21 NOTE — ED Triage Notes (Signed)
 Patient here today with c/o chest pain last night off and on. Patient states that he would get a stabbing pain occasionally. It happened 7-8 times last night but has not happened since he woke up. Patient takes Losartan and took is this morning.

## 2023-01-21 NOTE — ED Notes (Signed)
 Patient advised to go to the ED but stated that he did not want to go there due to the long wait time.

## 2023-01-21 NOTE — Discharge Instructions (Signed)
 Please go tot the ED for evaltuion

## 2023-02-07 ENCOUNTER — Other Ambulatory Visit: Payer: Self-pay | Admitting: Internal Medicine

## 2023-02-17 ENCOUNTER — Telehealth: Payer: Self-pay | Admitting: Pulmonary Disease

## 2023-02-17 NOTE — Telephone Encounter (Signed)
 Reviewing referral for [facility] patient had CT chest in 01/2023 without follow up.   Will forward to front staff to call patient to schedule Ov with Dr. Diania Fortes.

## 2023-02-19 NOTE — Telephone Encounter (Signed)
If patient wants sooner appt then schedule appt with APP. There are openings in late Feb/early March.

## 2023-04-14 ENCOUNTER — Ambulatory Visit: Payer: Medicaid Other | Admitting: Pulmonary Disease

## 2023-04-14 ENCOUNTER — Encounter: Payer: Self-pay | Admitting: Pulmonary Disease

## 2023-04-14 VITALS — BP 115/76 | HR 89 | Ht 70.0 in | Wt 154.0 lb

## 2023-04-14 DIAGNOSIS — Z720 Tobacco use: Secondary | ICD-10-CM

## 2023-04-14 DIAGNOSIS — J984 Other disorders of lung: Secondary | ICD-10-CM

## 2023-04-14 DIAGNOSIS — J432 Centrilobular emphysema: Secondary | ICD-10-CM | POA: Diagnosis not present

## 2023-04-14 DIAGNOSIS — R918 Other nonspecific abnormal finding of lung field: Secondary | ICD-10-CM | POA: Diagnosis not present

## 2023-04-14 DIAGNOSIS — J329 Chronic sinusitis, unspecified: Secondary | ICD-10-CM

## 2023-04-14 MED ORDER — FLUTICASONE PROPIONATE 50 MCG/ACT NA SUSP
1.0000 | Freq: Every day | NASAL | 2 refills | Status: DC
Start: 2023-04-14 — End: 2023-07-15

## 2023-04-14 MED ORDER — STIOLTO RESPIMAT 2.5-2.5 MCG/ACT IN AERS
2.0000 | INHALATION_SPRAY | Freq: Every day | RESPIRATORY_TRACT | 11 refills | Status: AC
Start: 2023-04-14 — End: ?

## 2023-04-14 NOTE — Patient Instructions (Addendum)
 For the nasal congestion start flonase nasal spray, 1 spray per nostril daily.   Start allegra 180mg  or zyrtec 10mg  daily for allergies causing inflammation of your nose  Continue stiolto 2 puffs daily  Your CT Chest scan is stable, no changes. We will follow up a scan in 1 year.   Follow up in 1 year, call sooner if needed

## 2023-04-14 NOTE — Progress Notes (Addendum)
 Synopsis: Referred in 2019 for cavitary lung lesion, COPD by Emilio Joesph DEL, PA-C. Previously a patient of Drs. McQuaid, Icard and [employer].  Subjective:   PATIENT ID: Calvin Schroeder [place] GENDER: male DOB: August 25, 1960, MRN: [id number]  HPI  Chief Complaint  Patient presents with   Follow-up   [facility] is a 63 year old male, daily smoker with a history of HIV, cavitary pulmonary MAC and COPD who returns to pulmonary clinic for follow up.   He continues on stiolto. Smoking 1 pack per day. Reports significant nasal congestion recently where he can't breath through his nose. Denies history of seasonal allergies.  CT Chest from 01/2023 shows stable cavitary lesion.  OV 06/11/22 He is smoking 1 pack per day. He reports his breathing has been stable on stiolto and as needed albuterol .  Repeat CT Chest scan April 2024 shows stable cavitary lesion and lung nodules.    OV 02/21/22 He has been doing well since last visit. He continues on stiolto and as needed albuterol . He has come cough with mucous production.   He is following with ID. Recent CT chest shows stable cavitary lesions with a new left upper lobe nodule measuring up to 1.3 cm in diameter adjacent to the cavitary lesion.   No fevers chills or night sweats.   OV 07/20/21 PFTs today moderate obstruction and mild diffusion defect. He has significant decline in spirometry and lung volumes since his last set of PFTs in 2019.   He continues to smoke daily. He is using stiolto daily.   OV 02/21/21 He has been doing well since our last visit over 1 year ago. He is using stiolto inhaler as needed which is not often. He continues to smoke 1 pack of cigarettes daily. 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 He remains on linezolid , omadacycline  and amikacin  with no side effects for treatment of his MAC.   He is also following with CT Surgery regarding the cavitary lesions related to the MAC infection. Most recent CT  Chest scan 12/2020 shows stable LUL cavitary lesion over the past year and stable nodules of the right lung over the past 2 years.  Past Medical History:  Diagnosis Date   Back pain    COPD (chronic obstructive pulmonary disease) (HCC)    emphysema    Dyspnea    with exertion    HIV (human immunodeficiency virus infection) (HCC)    Lung mass 06/10/2013   MAI   Pneumonia    11/2016   Tobacco abuse 06/10/2013     Family History  Problem Relation Age of Onset   Cancer Mother        lung ca   Cancer Father        skin ca   Cancer Maternal Uncle        liver ca   Cancer Maternal Grandmother        bladder ca     Social History   Socioeconomic History   Marital status: Single    Spouse name: Not on file   Number of children: Not on file   Years of education: Not on file   Highest education level: Not on file  Occupational History   Not on file  Tobacco Use   Smoking status: Every Day    Current packs/day: 1.00    Average packs/day: 1 pack/day for 40.0 years (40.0 ttl pk-yrs)    Types: Cigarettes   Smokeless tobacco: Never   Tobacco comments:  1/2 ppd as of 09/10/19  Vaping Use   Vaping status: Never Used  Substance and Sexual Activity   Alcohol use: Yes    Comment: daily - 3-4 alcohol   Drug use: Not Currently    Types: Marijuana    Comment: daily    Sexual activity: Not Currently    Partners: Female    Comment: refused  Other Topics Concern   Not on file  Social History Narrative   Not on file   Social Drivers of Health   Financial Resource Strain: Patient Declined (01/22/2023)   Received from [facility]   Overall Financial Resource Strain (CARDIA)    Difficulty of Paying Living Expenses: Patient declined  Food Insecurity: Patient Declined (01/22/2023)   Received from [facility]   Hunger Vital Sign    Worried About Running Out of Food in the Last Year: Patient declined    Ran Out of Food in the Last Year: Patient declined  Transportation Needs:  Patient Declined (01/22/2023)   Received from [facility]   PRAPARE - Transportation    Lack of Transportation (Medical): Patient declined    Lack of Transportation (Non-Medical): Patient declined  Physical Activity: Not on file  Stress: Stress Concern Present (02/15/2020)   Received from [employer], [facility]   [employer] of Occupational Health - Occupational Stress Questionnaire    Feeling of Stress : To some extent  Social Connections: Unknown (05/06/2021)   Received from [facility], [facility]   Social Network    Social Network: Not on file  Intimate Partner Violence: Unknown (04/09/2021)   Received from [facility], [facility]   HITS    Physically Hurt: Not on file    Insult or Talk Down To: Not on file    Threaten Physical Harm: Not on file    Scream or Curse: Not on file     Allergies  Allergen Reactions   Rifampin      Reports he could not function for 4 days  Other reaction(s): Other Reports he could not function for 4 days      Outpatient Medications Prior to Visit  Medication Sig Dispense Refill   albuterol  (VENTOLIN  HFA) 108 (90 Base) MCG/ACT inhaler Inhale 2 puffs into the lungs every 6 (six) hours as needed for wheezing or shortness of breath. 8 g 11   atorvastatin  (LIPITOR) 40 MG tablet Take 1 tablet (40 mg total) by mouth daily. 30 tablet 11   BIKTARVY 50-200-25 MG TABS tablet TAKE 1 TABLET BY MOUTH DAILY 30 tablet 5   Cholecalciferol 25 MCG (1000 UT) capsule Take 1,000 Units by mouth daily.     folic acid  (FOLVITE ) 1 MG tablet Take 1 mg by mouth daily.     ipratropium-albuterol  (DUONEB) 0.5-2.5 (3) MG/3ML SOLN USE 3 ML VIA NEBULIZER EVERY 6 HOURS AS NEEDED FOR SHORTNESS OF BREATH OR WHEEZING 1080 mL 1   losartan (COZAAR) 50 MG tablet Take 50 mg by mouth daily.     Misc. Devices KIT Flutter Valve 1 kit 0   ondansetron  (ZOFRAN ) 8 MG tablet Take 1 tablet (8 mg total) by mouth every 8 (eight) hours as needed for nausea or vomiting. 30 tablet 5    oxyCODONE -acetaminophen  (PERCOCET) 10-325 MG tablet Take 1 tablet by mouth 4 (four) times daily as needed.     STIOLTO RESPIMAT  2.5-2.5 MCG/ACT AERS INHALE 2 PUFFS INTO THE LUNGS DAILY 4 g 11   No facility-administered medications prior to visit.   Review of Systems  Constitutional:  Negative for chills, diaphoresis, fever, malaise/fatigue and weight loss.  HENT:  Positive for congestion. Negative for sinus pain and sore throat.   Eyes: Negative.   Respiratory:  Negative for cough, hemoptysis, sputum production, shortness of breath and wheezing.   Cardiovascular:  Negative for chest pain, palpitations, orthopnea and leg swelling.  Gastrointestinal:  Negative for abdominal pain, heartburn, nausea and vomiting.  Genitourinary: Negative.   Musculoskeletal: Negative.   Skin:  Negative for rash.  Neurological: Negative.   Endo/Heme/Allergies: Negative.   Psychiatric/Behavioral: Negative.      Objective:   Vitals:   04/14/23 1356  BP: 115/76  Pulse: 89  SpO2: 98%  Weight: 154 lb (69.9 kg)  Height: 5' 10 (1.778 m)    Physical Exam Constitutional:      General: He is not in acute distress.    Appearance: Normal appearance. He is normal weight.  HENT:     Head: Normocephalic and atraumatic.  Eyes:     General: No scleral icterus.    Conjunctiva/sclera: Conjunctivae normal.  Cardiovascular:     Rate and Rhythm: Normal rate.     Pulses: Normal pulses.     Heart sounds: Normal heart sounds.  Pulmonary:     Effort: Pulmonary effort is normal.     Breath sounds: Normal breath sounds.  Musculoskeletal:     Right lower leg: No edema.     Left lower leg: No edema.  Neurological:     General: No focal deficit present.     Mental Status: He is alert.    CBC    Component Value Date/Time   WBC 6.3 06/18/2022 1353   RBC 4.50 06/18/2022 1353   HGB 16.8 06/18/2022 1353   HGB 14.3 12/10/2016 1042   HCT 47.5 06/18/2022 1353   HCT 42.1 12/10/2016 1042   PLT 215 06/18/2022  1353   PLT 121 (L) 12/10/2016 1042   MCV 105.6 (H) 06/18/2022 1353   MCV 98.9 (H) 12/10/2016 1042   MCH 37.3 (H) 06/18/2022 1353   MCHC 35.4 06/18/2022 1353   RDW 13.1 06/18/2022 1353   RDW 12.6 12/10/2016 1042   LYMPHSABS 2,092 06/18/2022 1353   LYMPHSABS 0.7 (L) 12/10/2016 1042   MONOABS 0.8 06/21/2021 2000   MONOABS 0.4 12/10/2016 1042   EOSABS 120 06/18/2022 1353   EOSABS 0.1 12/10/2016 1042   BASOSABS 120 06/18/2022 1353   BASOSABS 0.0 12/10/2016 1042      Latest Ref Rng & Units 06/18/2022    1:53 PM 02/05/2022    1:52 PM 07/06/2021    1:55 AM  BMP  Glucose 65 - 99 mg/dL 88  888  888   BUN 7 - 25 mg/dL 7  10  6    Creatinine 0.70 - 1.30 mg/dL 9.25  9.42  9.15   BUN/Creat Ratio 6 - 22 (calc) SEE NOTE:  18  7   Sodium 135 - 146 mmol/L 143  140  138   Potassium 3.5 - 5.3 mmol/L 4.2  3.5  3.6   Chloride 98 - 110 mmol/L 106  101  102   CO2 20 - 32 mmol/L 29  28  25    Calcium  8.6 - 10.3 mg/dL 9.0  9.6  8.5    Chest imaging: CT Chest 01/2023 1. Chronic cavitary process in the left lung apex with slight decreased size of the dominant cavitary component from prior. 2. Chronic areas of nodularity in both lungs, stable from prior exam. No new or enlarging pulmonary nodules.  3. Advanced emphysema. 4. Aortic atherosclerosis.  Coronary artery calcifications. 5. Advanced hepatic steatosis.  CT Chest 04/2022 1. No significant change in size of cavitary process within the left upper lobe. 2. The new nodular lesion described on the exam from 01/29/2022 has decreased in size from the previous exam current Leigh measuring 6 mm versus 1.2 cm previously. This is compatible with a benign inflammatory/infectious process. 3. Additional bilateral pulmonary nodules are again noted. These are stable to mildly decreased in size when compared with previous exam. 4. Hepatic steatosis. 5. Coronary artery calcifications. 6.  Aortic Atherosclerosis  CT Chest 01/29/22 1. The dominant cavitary  left upper lobe lesion has not significantly changed, although there is a new adjacent nodular lesion in the left upper lobe measuring up to 1.3 cm in diameter. This is favored to be inflammatory/postinflammatory, although neoplasm not entirely excluded. Suggest attention on shorter term follow-up CT (3-6 months). 2. No other changes or suspicious findings. Stable chronic right lung findings. No adenopathy or pleural effusion. 3. Hepatic steatosis. 4. Aortic Atherosclerosis (ICD10-I70.0) and Emphysema  CT Chest 12/2020 Severe pulmonary emphysema with paraseptal in centrilobular emphysematous changes.   Area with cavitation in the LEFT upper lobe with bandlike changes that extend both the pleural surface and the LEFT suprahilar region, associated with retraction of the major fissure anteriorly. Also similar when compared to the study of December 07, 2019, diminished in size compared to more remote imaging.   Additional areas of nodularity throughout the chest which are also similar to previous imaging with scattered areas of grouped nodularity throughout the chest without substantial change. Findings are compatible with chronic infection. Continued surveillance may be warranted given the spiculated nature of some of these areas particularly in the RIGHT upper lobe. Consider lung cancer screening if appropriate or 12 month follow-up as warranted.   Aortic atherosclerosis.   Aortic Atherosclerosis  CT Chest 12/07/19 1. Continued decrease in size of LEFT upper lobe cavitary area compatible with post infectious scarring and cavitation. Diminished nodularity about this area compatible with post infectious or inflammatory changes. 2. Nodules in the RIGHT chest are stable to slightly diminished in size but within 1 mm of previous measurements. Continued attention on follow-up is suggested, stable since December 2020, shown to be cavitary in August of 2020 also likely post infectious  but with limited change in size. 3. Emphysema and aortic atherosclerosis.  PFT:    Latest Ref Rng & Units 07/20/2021    2:59 PM 06/11/2017   10:40 AM  PFT Results  FVC-Pre L 3.16  3.63   FVC-Predicted Pre % 70  73   FVC-Post L 3.58    FVC-Predicted Post % 79    Pre FEV1/FVC % % 60  65   Post FEV1/FCV % % 61    FEV1-Pre L 1.89  2.34   FEV1-Predicted Pre % 55  61   FEV1-Post L 2.18    DLCO uncorrected ml/min/mmHg 16.43    DLCO UNC% % 62    DLCO corrected ml/min/mmHg 15.01    DLCO COR %Predicted % 57    DLVA Predicted % 65       Assessment & Plan:   Cavitary lesion of lung  Pulmonary nodules - Plan: Ambulatory Referral for Lung Cancer Scre  Centrilobular emphysema (HCC) - Plan: Tiotropium Bromide-Olodaterol (STIOLTO RESPIMAT ) 2.5-2.5 MCG/ACT AERS  Tobacco use - Plan: Ambulatory Referral for Lung Cancer Scre  Sinusitis, unspecified chronicity, unspecified location - Plan: fluticasone  (FLONASE ) 50 MCG/ACT nasal spray  Discussion: Calvin  Harbeck is a 63 year old male, daily smoker with a history of HIV, cavitary pulmonary MAC and COPD who returns to pulmonary clinic for follow up.   He is doing well from a COPD standpoint and will continue on stiolto respimat  daily along with as needed albuterol . Will order nebulizer machine and albuterol  solution to use as needed for airway clearance.  LUL cavitary lesion has remained stable. Will refer to lung cancer screening team for ongoing CT surveillance.    Try flonase  nasal spray and either Zyrtec or Allegra daily for the nasal congestion. Can consider breath right strips.  Follow up in 1 year, call sooner if needed.  Dorn Chill, MD [facility] Office: [phone]    Current Outpatient Medications:    albuterol  (VENTOLIN  HFA) 108 (90 Base) MCG/ACT inhaler, Inhale 2 puffs into the lungs every 6 (six) hours as needed for wheezing or shortness of breath., Disp: 8 g, Rfl: 11   atorvastatin  (LIPITOR) 40  MG tablet, Take 1 tablet (40 mg total) by mouth daily., Disp: 30 tablet, Rfl: 11   BIKTARVY 50-200-25 MG TABS tablet, TAKE 1 TABLET BY MOUTH DAILY, Disp: 30 tablet, Rfl: 5   Cholecalciferol 25 MCG (1000 UT) capsule, Take 1,000 Units by mouth daily., Disp: , Rfl:    fluticasone  (FLONASE ) 50 MCG/ACT nasal spray, Place 1 spray into both nostrils daily., Disp: 16 g, Rfl: 2   folic acid  (FOLVITE ) 1 MG tablet, Take 1 mg by mouth daily., Disp: , Rfl:    ipratropium-albuterol  (DUONEB) 0.5-2.5 (3) MG/3ML SOLN, USE 3 ML VIA NEBULIZER EVERY 6 HOURS AS NEEDED FOR SHORTNESS OF BREATH OR WHEEZING, Disp: 1080 mL, Rfl: 1   losartan (COZAAR) 50 MG tablet, Take 50 mg by mouth daily., Disp: , Rfl:    Misc. Devices KIT, Flutter Valve, Disp: 1 kit, Rfl: 0   ondansetron  (ZOFRAN ) 8 MG tablet, Take 1 tablet (8 mg total) by mouth every 8 (eight) hours as needed for nausea or vomiting., Disp: 30 tablet, Rfl: 5   oxyCODONE -acetaminophen  (PERCOCET) 10-325 MG tablet, Take 1 tablet by mouth 4 (four) times daily as needed., Disp: , Rfl:    Tiotropium Bromide-Olodaterol (STIOLTO RESPIMAT ) 2.5-2.5 MCG/ACT AERS, Inhale 2 puffs into the lungs daily., Disp: 4 g, Rfl: 11

## 2023-04-25 ENCOUNTER — Other Ambulatory Visit: Payer: Self-pay | Admitting: Internal Medicine

## 2023-05-07 ENCOUNTER — Other Ambulatory Visit: Payer: Self-pay

## 2023-05-07 DIAGNOSIS — B20 Human immunodeficiency virus [HIV] disease: Secondary | ICD-10-CM

## 2023-05-07 DIAGNOSIS — Z113 Encounter for screening for infections with a predominantly sexual mode of transmission: Secondary | ICD-10-CM

## 2023-05-07 DIAGNOSIS — Z79899 Other long term (current) drug therapy: Secondary | ICD-10-CM

## 2023-05-13 ENCOUNTER — Other Ambulatory Visit: Payer: Self-pay

## 2023-05-13 ENCOUNTER — Other Ambulatory Visit: Payer: Medicaid Other

## 2023-05-13 DIAGNOSIS — Z113 Encounter for screening for infections with a predominantly sexual mode of transmission: Secondary | ICD-10-CM

## 2023-05-13 DIAGNOSIS — Z79899 Other long term (current) drug therapy: Secondary | ICD-10-CM

## 2023-05-13 DIAGNOSIS — B20 Human immunodeficiency virus [HIV] disease: Secondary | ICD-10-CM

## 2023-05-15 LAB — CBC WITH DIFFERENTIAL/PLATELET
Absolute Lymphocytes: 1512 {cells}/uL (ref 850–3900)
Absolute Monocytes: 810 {cells}/uL (ref 200–950)
Basophils Absolute: 72 {cells}/uL (ref 0–200)
Basophils Relative: 0.8 %
Eosinophils Absolute: 72 {cells}/uL (ref 15–500)
Eosinophils Relative: 0.8 %
HCT: 42.2 % (ref 38.5–50.0)
Hemoglobin: 15 g/dL (ref 13.2–17.1)
MCH: 36.1 pg — ABNORMAL HIGH (ref 27.0–33.0)
MCHC: 35.5 g/dL (ref 32.0–36.0)
MCV: 101.7 fL — ABNORMAL HIGH (ref 80.0–100.0)
MPV: 10.1 fL (ref 7.5–12.5)
Monocytes Relative: 9 %
Neutro Abs: 6534 {cells}/uL (ref 1500–7800)
Neutrophils Relative %: 72.6 %
Platelets: 224 10*3/uL (ref 140–400)
RBC: 4.15 10*6/uL — ABNORMAL LOW (ref 4.20–5.80)
RDW: 14.2 % (ref 11.0–15.0)
Total Lymphocyte: 16.8 %
WBC: 9 10*3/uL (ref 3.8–10.8)

## 2023-05-15 LAB — LIPID PANEL
Cholesterol: 154 mg/dL (ref <200)
HDL: 73 mg/dL (ref >=40)
LDL Cholesterol (Calc): 64 mg/dL
Non-HDL Cholesterol (Calc): 81 mg/dL (ref <130)
Total CHOL/HDL Ratio: 2.1 (calc) (ref <5.0)
Triglycerides: 91 mg/dL (ref <150)

## 2023-05-15 LAB — COMPLETE METABOLIC PANEL WITHOUT GFR
AG Ratio: 1.4 (calc) (ref 1.0–2.5)
ALT: 102 U/L — ABNORMAL HIGH (ref 9–46)
AST: 70 U/L — ABNORMAL HIGH (ref 10–35)
Albumin: 4.9 g/dL (ref 3.6–5.1)
Alkaline phosphatase (APISO): 95 U/L (ref 35–144)
BUN: 19 mg/dL (ref 7–25)
CO2: 26 mmol/L (ref 20–32)
Calcium: 9.5 mg/dL (ref 8.6–10.3)
Chloride: 94 mmol/L — ABNORMAL LOW (ref 98–110)
Creat: 1.23 mg/dL (ref 0.70–1.35)
Globulin: 3.4 g/dL (ref 1.9–3.7)
Glucose, Bld: 85 mg/dL (ref 65–99)
Potassium: 3.3 mmol/L — ABNORMAL LOW (ref 3.5–5.3)
Sodium: 133 mmol/L — ABNORMAL LOW (ref 135–146)
Total Bilirubin: 0.4 mg/dL (ref 0.2–1.2)
Total Protein: 8.3 g/dL — ABNORMAL HIGH (ref 6.1–8.1)

## 2023-05-15 LAB — T-HELPER CELLS (CD4) COUNT (NOT AT ARMC)
Absolute CD4: 518 {cells}/uL (ref 490–1740)
CD4 T Helper %: 33 % (ref 30–61)
Total lymphocyte count: 1568 {cells}/uL (ref 850–3900)

## 2023-05-15 LAB — RPR: RPR Ser Ql: NONREACTIVE

## 2023-05-15 LAB — HIV-1 RNA QUANT-NO REFLEX-BLD
HIV 1 RNA Quant: NOT DETECTED {copies}/mL
HIV-1 RNA Quant, Log: NOT DETECTED {Log_copies}/mL

## 2023-05-21 NOTE — Progress Notes (Signed)
 The 10-year ASCVD risk score (Arnett DK, et al., 2019) is: 8%   Values used to calculate the score:     Age: 63 years     Sex: Male     Is Non-Hispanic African American: No     Diabetic: No     Tobacco smoker: Yes     Systolic Blood Pressure: 115 mmHg     Is BP treated: Yes     HDL Cholesterol: 73 mg/dL     Total Cholesterol: 154 mg/dL  Arlon Bergamo, BSN, RN

## 2023-05-27 ENCOUNTER — Ambulatory Visit: Payer: Self-pay | Admitting: Internal Medicine

## 2023-06-19 ENCOUNTER — Ambulatory Visit: Admitting: Internal Medicine

## 2023-07-03 ENCOUNTER — Encounter: Payer: Self-pay | Admitting: Internal Medicine

## 2023-07-03 ENCOUNTER — Other Ambulatory Visit: Payer: Self-pay

## 2023-07-03 ENCOUNTER — Ambulatory Visit: Admitting: Internal Medicine

## 2023-07-03 VITALS — BP 100/65 | HR 95 | Resp 16 | Ht 70.0 in | Wt 152.0 lb

## 2023-07-03 DIAGNOSIS — R911 Solitary pulmonary nodule: Secondary | ICD-10-CM

## 2023-07-03 DIAGNOSIS — A310 Pulmonary mycobacterial infection: Secondary | ICD-10-CM | POA: Diagnosis not present

## 2023-07-03 DIAGNOSIS — R7989 Other specified abnormal findings of blood chemistry: Secondary | ICD-10-CM | POA: Diagnosis not present

## 2023-07-03 DIAGNOSIS — B20 Human immunodeficiency virus [HIV] disease: Secondary | ICD-10-CM | POA: Diagnosis present

## 2023-07-03 DIAGNOSIS — R110 Nausea: Secondary | ICD-10-CM

## 2023-07-03 DIAGNOSIS — F1090 Alcohol use, unspecified, uncomplicated: Secondary | ICD-10-CM | POA: Diagnosis not present

## 2023-07-03 DIAGNOSIS — J449 Chronic obstructive pulmonary disease, unspecified: Secondary | ICD-10-CM | POA: Diagnosis not present

## 2023-07-03 DIAGNOSIS — N179 Acute kidney failure, unspecified: Secondary | ICD-10-CM

## 2023-07-03 MED ORDER — BICTEGRAVIR-EMTRICITAB-TENOFOV 50-200-25 MG PO TABS: 1.0000 | ORAL_TABLET | Freq: Every day | ORAL | 11 refills | Status: AC

## 2023-07-03 NOTE — Patient Instructions (Addendum)
 Please do blood tests first week August (liver/kidney)   Let's meet again in about end of August or early September so I can see what your liver and kidney is doing  Your hiv is great -- continue biktarvy  If your kidney continues to be worse, will have to potentially switch biktarvy to something else and investigate more  Will see what the gi doctor says about the liver    Smoking Cessation: QuitlineNC 1-800-QUIT-NOW [phone]); Espaol: 1-855-Djelo-Ya (1-631-053-9813) [email or web address].[email or web address]

## 2023-07-03 NOTE — Progress Notes (Signed)
 Subjective:    Patient ID: Calvin Schroeder, male  DOB: [birth year], 63 y.o.        MRN: [id number]   HPI 63yo M previously found to have cystic lesion on chest CT on adm for PNA. 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 He was seen in ID 03-2017 was found to have AIDS. He was started on E/A/R however developed n/v (that persisted despite zofran )  Restarted MAI rx ~12-2017.    He was started on tivicay -descovy . He has had f/u with pulm for COPD.  He has also f/u with CVTS for a cyctic mass in LUL.  His last CVTS f/u was 06-2019- He had repeat CT at that time that showed: 1. The large thick-walled, cavitary left upper lobe lesion has not significantly changed in size from the most recent CT and is most consistent with postinflammatory scarring given relative stability for several years. 2. There is increased multifocal nodularity at the left apex superior to this cavitary lesion, which appears inflammatory. Continued follow-up in 6-12 months suggested. 3. Additional bilateral pulmonary nodules are stable. No adenopathy or pleural effusion. 4. Aortic Atherosclerosis (ICD10-I70.0) and Emphysema (ICD10-J43.9).   He had f/u appt with Pulm 07-09-19 and had repeat Cx showing M abscessus.       Mycobacterium abscessus complex  Amikacin  4.0 ug/mL Susceptible  Cefoxitin Comment  Comment: 32.0 ug/mL Intermediate  Ciprofloxacin >4.0 ug/mL Resistant  Clarithromycin >16.0 ug/mL Resistant  Comment: This organism has been evaluated for inducible macrolide resistance.  Doxycycline  16.0 ug/mL Resistant  Imipenem Comment  Comment: 8.0 ug/mL Intermediate  Linezolid  Comment  Comment: 1.0 ug/mL or less, Susceptible  Minocycline >8.0 ug/mL Resistant  Moxifloxacin Comment  Comment: 2.0 ug/mL Intermediate  Tigecycline 0.25 ug/mL  Tobramycin CANCELED  Comment: Test not performed   Result canceled by the ancillary.  Trimethoprim /Sulfa  Comment  Comment: >8/152 ug/mL Resistant    He has also been seen by Onc  for KS.   He was seen in ID and by pharm in August- his M abscessus therapy was changed to  clofazamine (which he has not been able to tolerate due to n/v), linezolid  and omdacycline. He did not take.  September 2021, he was given arikace. He quit taking the clofazamine (diarrhea). He also took zyvox  which made him nauseated. He did not start omadacycline , too scared. October 2021- not clear he was taking any of his M abscessus therapy. Reinforced need to take.  November 30-21 CT scan- 1. Continued decrease in size of LEFT upper lobe cavitary area compatible with post infectious scarring and cavitation. Diminishednodularity about this area compatible with post infectious or inflammatory changes. 2. Nodules in the RIGHT chest are stable to slightly diminished in size but within 1 mm of previous measurements. Continued attention on follow-up is suggested, stable since December 2020, shown to be cavitary in August of 2020 also likely post infectious but with limited change in size. 3. Emphysema and aortic atherosclerosis.   He has had intermittent adherence to his M abscesses rx (better with the arykace, difficult with timing of meds and meals).     He has had the neck mass removed 3 weeks prior. (12-27-19 [facility]- path- thyroglossal duct cyst).  Pending cervical fusion.    12-2020 he has f/u with CVTS, and  repeat CT scan following.  He is taking his M abscessus rx (since July 2022) (but he can't name them.  Severe pulmonary emphysema with paraseptal in centrilobular emphysematous changes.   Area with cavitation in the LEFT  upper lobe with bandlike changes that extend both the pleural surface and the LEFT suprahilar region, associated with retraction of the major fissure anteriorly. Also similar when compared to the study of December 07, 2019, diminished in size compared to more remote imaging.   Additional areas of nodularity throughout the chest which are also similar to previous imaging with  scattered areas of grouped nodularity throughout the chest without substantial change. Findings are compatible with chronic infection. Continued surveillance may be warranted given the spiculated nature of some of these areas particularly in the RIGHT upper lobe. Consider lung cancer screening if appropriate or 12 month follow-up as warranted.   No problems with DTGV/descovey.  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 Denies MSM activity.  Recently eval for hypoK after his blood work here. Given supplement. No diarrhea; has had regular emesis (dry heaves every morning). Doesn't feel like it comes from his stomach. Has noted since removal of thyroglossal duct cyst.  States he has been taking his M absccesus rx. States [name] is not going to do another CT til Dec.  2-3 mixed drinks/day.   HIV 1 RNA Quant  Date Value  05/13/2023 NOT DETECTED copies/mL  11/19/2022 Not Detected Copies/mL  06/18/2022 Not Detected Copies/mL   CD4 T Cell Abs (/uL)  Date Value  11/19/2022 490  02/05/2022 420  06/14/2021 344 (L)   ------------------- 02/19/2022 id clinic f/u #hiv heterosexual Well controlled on tivicay /descovy . 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 He is currently on maintenance linezolid , omadocycline, and arikayce . No numbness/tingling, dyspnea. Has lots of nausea He gets sob coming back from the mailbox at his home as it sloped up. Not change in activity tolerance the past year No f/c/nightsweat  He has copd and uses inhalers. He does have lung doctors. His last pft 07/2021 showed fev1 55% decreased from 2019. He had afb sputum 07/30/19 that was negative but also positive test a few weeks prior to that  He still smokes 01/2022 chest ct stable lul cavitary lesions but new 1.3 cm nodule. He had seen  pulmonology and plan to repeat chest ct in 6 months    04/01/22 id clinic f/u Patient here for f/u sputum cx and ntm lung infection 2/27 afb sputum m-abscessus pending susceptibility 3/04 and 3/14 sputum growing afb not yet identified He ran out of linezolid , arykace, and omadocycline 09/2021 and hadn't filled it again As from previous visit, his respiratory/functional status has been stable He continues to smoke I would guesstimate his functional status is around MET 4-5    06/18/22 id clinic visit He had a chest ct 04/23/22 for f/u hx m-abscessus lung disease. This showed stable disease Patient again confirms (as in 03/2022 visit) that he had stopped the omadocycline, arykace, and linezolid  on his own.  He had continued to grow m-abscessus in his sputum as of 03/2022. His last sputum before that was in 2021 and he was growing m-abscessus as well He last grow mac in the sputum in 02/2017  He was on mac treatment either end of 2018 until 2021 --> no change in respiratory sx. His mac regimen include azithromycin , rifampin , and ethambutol , which he stopped the rifampin  after a few doses due to side effect. In mid 2021 when abscessus was found (no mac) his ntm regimen was changed to  arykace/clofazimine /omadocycline and zyvox   He does have severe copd.  No change in health since last visit No missed dose of biktarvy last 4 weeks. He has been taking since 02/2022 id visit with me  Social -- staying with his former wife but they are legally separated   11/19/22 id clinic f/u See a&p No missed dose biktarvy last 4 weeks Off m-abscessus tx since late 2023 and stable clinically Actually gaining weight Still smoking though No sob or coughing more the last 2 years No fever/chill Appetite is good Not sexually active - doesn't want std testing Has pcp and will see her soon for physical exam/cancer screening    07/03/23 id clinic f/u See a&p Nausea chronic intermittent Hiv compliant with  biktarvy Lft and cr up    Health Maintenance  Topic Date Due   COVID-19 Vaccine (6 - 2024-25 season) 09/08/2022   Hepatitis B Vaccines (1 of 3 - Risk 3-dose series) Never done   INFLUENZA VACCINE  08/08/2023   Lung Cancer Screening  01/09/2024   DTaP/Tdap/Td (3 - Td or Tdap) 07/02/2028   Colonoscopy  01/07/2029   Pneumococcal Vaccine 78-29 Years old  Completed   Hepatitis C Screening  Completed   HIV Screening  Completed   Zoster Vaccines- Shingrix  Completed   HPV VACCINES  Aged Out   Meningococcal B Vaccine  Aged Out     Ros: All other ros negative      Objective:  Physical Exam Vitals reviewed.  Constitutional:      Appearance: Normal appearance.  HENT:     Mouth/Throat:     Mouth: Mucous membranes are moist.     Pharynx: No oropharyngeal exudate.   Eyes:     Extraocular Movements: Extraocular movements intact.     Pupils: Pupils are equal, round, and reactive to light.    Cardiovascular:     Rate and Rhythm: Normal rate and regular rhythm.  Pulmonary:     Effort: Pulmonary effort is normal.  Abdominal:     General: Bowel sounds are normal. There is no distension.     Palpations: Abdomen is soft.   Musculoskeletal:     Cervical back: Normal range of motion and neck supple.     Right lower leg: No edema.     Left lower leg: No edema.   Neurological:     General: No focal deficit present.     Mental Status: He is alert.     Labs: Lab Results  Component Value Date   WBC 9.0 05/13/2023   HGB 15.0 05/13/2023   HCT 42.2 05/13/2023   MCV 101.7 (H) 05/13/2023   PLT 224 05/13/2023   Last metabolic panel Lab Results  Component Value Date   GLUCOSE 85 05/13/2023   NA 133 (L) 05/13/2023   K 3.3 (L) 05/13/2023   CL 94 (L) 05/13/2023   CO2 26 05/13/2023   BUN 19 05/13/2023   CREATININE 1.23 05/13/2023   EGFR 104 06/18/2022   CALCIUM  9.5 05/13/2023   PROT 8.3 (H) 05/13/2023   ALBUMIN 3.7 07/02/2019   BILITOT 0.4 05/13/2023   ALKPHOS 63  07/02/2019   AST 70 (H) 05/13/2023   ALT 102 (H) 05/13/2023   ANIONGAP 10 06/22/2021    Imaging: Reviewed   04/23/22 chest ct 1. No significant change in size of cavitary process within the left upper lobe. 2. The new nodular lesion described on the exam from 01/29/2022 has decreased in size from the previous exam current Leigh measuring  6 mm versus 1.2 cm previously. This is compatible with a benign inflammatory/infectious process. 3. Additional bilateral pulmonary nodules are again noted. These are stable to mildly decreased in size when compared with previous exam. 4. Hepatic steatosis. 5. Coronary artery calcifications. 6.  Aortic Atherosclerosis    08/27/22 chest ct 1. Chronic cavitary process in the left lung apex with adjacent bandlike scarring, airway thickening, and airway plugging. The cavitary process is slightly similar to 04/23/2022 and smaller than on 06/16/2019. 2. Other areas of chronic nodularity and branching nodularity bilaterally are stable from 06/16/2019, compatible with benign postinflammatory/infectious process. 3. Severe emphysema. 4. Diffuse hepatic steatosis. 5. Healing fracture of the right anterior fifth rib, probably subacute superimposed on old deformity.      Assessment & Plan:  #hiv #reprieve heterosexual Changed to biktarvy 02/2022 and doing well on that  06/2023 no missed dose biktarvy last 4 weeks.   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 Couldn't tolerate rifampin . 07/2019 sputum doesn't grow mac any more, but grew m-abscessus    #m-abscessus #copd #pulm nodule #lul cavitary 07/2019 last sputum obtained. I don't have any sputum after that and that grew m-abscessus.  His mac havent grown then.  M-abscessus tx 08/2019 until 09/2021 and per patient not consistently taking during those time 01/2022 ct stable outside of 1 new nodule -- he still smokes so ?unclear etiology 04/2022 ct stable and the nodule size slightly improved since 01/2022 Symptomatically stable functional status No fever/chill/weight loss for at least as his time with me since 02/2022. Minimal cough  At this time given persistent growth despite on more than 2 years of m-abscessus treatment, and stable ct and minimal sx, will continue to watch off antibiotics  He mentioned previously last took any m-abscessus medication 09/2021 when he ran out  08/2022 chest ct stable cavitary/scarring; clinically stable as well; no sign of b sx  -continue observing off antibiotics for m-abscessus   #nausea #elevated lft's #etoh use Sx about couple years as of 06/2023 2 etoh consumption in evening No weight loss Appetite still good No hx PUD and not on acid blocker No family hx gastric cancer  -seeing gi -lft rising and will see what GI workup is -advise ease off alcohol for now   #elevated cr 05/2023 cr 1.2 >20% rise from baseline 0.7 ?dehydration vs biktarvy vs other intrarenal process?  -repeat labs in 2-3 months -further testing based on that   #hcm -vaccination Prevnar13 2019 Tdap 2020 Shingrix x2 shots by 05/2020 Meingococcal booster 08/2019 Flu shot/covid shot this year 2024 Patient discussed rsv vaccine and I agree and he can get from primary care -hepatitis 2019 hep b sAb reactive, hep b cAb and sAg negative 2019 hep c ab negative 2024 hep b sAb 650 -tb 11/2022 quantiferon gold negative -std 05/2023 rpr nr 01/2022 urine gc/chlam negative Not sexually active too long. Deferred at his request -cancer screening Defer to pcp

## 2023-07-15 ENCOUNTER — Other Ambulatory Visit: Payer: Self-pay | Admitting: Pulmonary Disease

## 2023-07-15 DIAGNOSIS — J329 Chronic sinusitis, unspecified: Secondary | ICD-10-CM

## 2023-07-15 MED ORDER — FLUTICASONE PROPIONATE 50 MCG/ACT NA SUSP: 1.0000 | Freq: Every day | NASAL | 2 refills | Status: DC

## 2023-07-15 NOTE — Telephone Encounter (Signed)
 Copied from CRM [id number]. Topic: Clinical - Medication Refill >> Jul 15, 2023  1:19 PM Isabell A wrote: Medication: fluticasone  (FLONASE ) 50 MCG/ACT nasal spray  Has the patient contacted their pharmacy? No (Agent: If no, request that the patient contact the pharmacy for the refill. If patient does not wish to contact the pharmacy document the reason why and proceed with request.) (Agent: If yes, when and what did the pharmacy advise?)  This is the patient's preferred pharmacy:  [id number] - [place], [place] - [address] AT [facility] OF [address] [place] [address] Phone: [phone] Fax: [phone]  Is this the correct pharmacy for this prescription? Yes If no, delete pharmacy and type the correct one.   Has the prescription been filled recently? Yes  Is the patient out of the medication? Yes  Has the patient been seen for an appointment in the last year OR does the patient have an upcoming appointment? Yes  Can we respond through MyChart? No  Agent: Please be advised that Rx refills may take up to 3 business days. We ask that you follow-up with your pharmacy.

## 2023-08-08 ENCOUNTER — Other Ambulatory Visit

## 2023-08-18 ENCOUNTER — Other Ambulatory Visit: Payer: Self-pay

## 2023-08-18 ENCOUNTER — Other Ambulatory Visit

## 2023-08-18 DIAGNOSIS — R7989 Other specified abnormal findings of blood chemistry: Secondary | ICD-10-CM

## 2023-08-18 DIAGNOSIS — N179 Acute kidney failure, unspecified: Secondary | ICD-10-CM

## 2023-08-18 LAB — COMPLETE METABOLIC PANEL WITHOUT GFR
AG Ratio: 1.5 (calc) (ref 1.0–2.5)
ALT: 25 U/L (ref 9–46)
AST: 32 U/L (ref 10–35)
Albumin: 4.6 g/dL (ref 3.6–5.1)
Alkaline phosphatase (APISO): 95 U/L (ref 35–144)
BUN/Creatinine Ratio: 12 (calc) (ref 6–22)
BUN: 8 mg/dL (ref 7–25)
CO2: 26 mmol/L (ref 20–32)
Calcium: 9.1 mg/dL (ref 8.6–10.3)
Chloride: 104 mmol/L (ref 98–110)
Creat: 0.67 mg/dL — ABNORMAL LOW (ref 0.70–1.35)
Globulin: 3 g/dL (ref 1.9–3.7)
Glucose, Bld: 87 mg/dL (ref 65–99)
Potassium: 3.4 mmol/L — ABNORMAL LOW (ref 3.5–5.3)
Sodium: 141 mmol/L (ref 135–146)
Total Bilirubin: 0.6 mg/dL (ref 0.2–1.2)
Total Protein: 7.6 g/dL (ref 6.1–8.1)

## 2023-08-18 LAB — CBC WITH DIFFERENTIAL/PLATELET
Absolute Lymphocytes: 1579 {cells}/uL (ref 850–3900)
Absolute Monocytes: 633 {cells}/uL (ref 200–950)
Basophils Absolute: 78 {cells}/uL (ref 0–200)
Basophils Relative: 1.4 %
Eosinophils Absolute: 123 {cells}/uL (ref 15–500)
Eosinophils Relative: 2.2 %
HCT: 43.3 % (ref 38.5–50.0)
Hemoglobin: 14.7 g/dL (ref 13.2–17.1)
MCH: 35.5 pg — ABNORMAL HIGH (ref 27.0–33.0)
MCHC: 33.9 g/dL (ref 32.0–36.0)
MCV: 104.6 fL — ABNORMAL HIGH (ref 80.0–100.0)
MPV: 10.1 fL (ref 7.5–12.5)
Monocytes Relative: 11.3 %
Neutro Abs: 3186 {cells}/uL (ref 1500–7800)
Neutrophils Relative %: 56.9 %
Platelets: 215 Thousand/uL (ref 140–400)
RBC: 4.14 Million/uL — ABNORMAL LOW (ref 4.20–5.80)
RDW: 13.5 % (ref 11.0–15.0)
Total Lymphocyte: 28.2 %
WBC: 5.6 Thousand/uL (ref 3.8–10.8)

## 2023-08-22 ENCOUNTER — Telehealth (HOSPITAL_BASED_OUTPATIENT_CLINIC_OR_DEPARTMENT_OTHER): Payer: Self-pay

## 2023-08-22 NOTE — Telephone Encounter (Unsigned)
 Copied from CRM #[id number]. Topic: Clinical - Medication Question >> Aug 22, 2023 12:44 PM Rozanna MATSU wrote: Reason for CRM: PT CALLING ABOUT CT TO GET SCHEDULED ADVISED HIM THAT HE CAN ALSO CALL BACK TOWARDS THE END OF THE YEAR TO GET HIS FOLLOW UP SCHEUDLED

## 2023-08-25 ENCOUNTER — Other Ambulatory Visit: Payer: Self-pay | Admitting: Pulmonary Disease

## 2023-08-25 NOTE — Telephone Encounter (Signed)
 Returned call. SDMV and LDCT due in January 2026. Patient verbalized understanding. He is aware he will get a call closer to due date to complete [facility] referral.

## 2023-08-25 NOTE — Telephone Encounter (Unsigned)
 Copied from CRM #[id number]. Topic: Clinical - Medication Refill >> Aug 25, 2023 10:09 AM Nathanel DEL wrote: Medication:  ipratropium-albuterol  (DUONEB) 0.5-2.5 (3) MG/3ML SOLN   Has the patient contacted their pharmacy? No (Agent: If no, request that the patient contact the pharmacy for the refill. If patient does not wish to contact the pharmacy document the reason why and proceed with request.) (Agent: If yes, when and what did the pharmacy advise?)  This is the patient's preferred pharmacy:  [id number] - [place], [place] - [address] AT [facility] OF [address] [place]  [address] Phone: [phone] Fax: [phone]    Is this the correct pharmacy for this prescription? Yes If no, delete pharmacy and type the correct one.   Has the prescription been filled recently? Yes  Is the patient out of the medication? Yes  Has the patient been seen for an appointment in the last year OR does the patient have an upcoming appointment? No  Can we respond through MyChart? No  Agent: Please be advised that Rx refills may take up to 3 business days. We ask that you follow-up with your pharmacy.

## 2023-08-26 MED ORDER — IPRATROPIUM-ALBUTEROL 0.5-2.5 (3) MG/3ML IN SOLN: 3.0000 mL | Freq: Four times a day (QID) | RESPIRATORY_TRACT | 1 refills | Status: DC | PRN

## 2023-08-28 ENCOUNTER — Other Ambulatory Visit: Payer: Self-pay | Admitting: Pulmonary Disease

## 2023-08-28 DIAGNOSIS — J432 Centrilobular emphysema: Secondary | ICD-10-CM

## 2023-09-01 ENCOUNTER — Telehealth: Payer: Self-pay

## 2023-09-01 ENCOUNTER — Telehealth: Payer: Self-pay | Admitting: Pulmonary Disease

## 2023-09-01 ENCOUNTER — Other Ambulatory Visit: Payer: Self-pay

## 2023-09-01 DIAGNOSIS — J432 Centrilobular emphysema: Secondary | ICD-10-CM

## 2023-09-01 NOTE — Telephone Encounter (Signed)
 Per Avelina at [facility]-  We would need OV/progress notes by provider mentioning the need in the last 90days please.

## 2023-09-01 NOTE — Telephone Encounter (Signed)
 Copied from CRM [id number]. Topic: Clinical - Order For Equipment >> Sep 01, 2023 10:42 AM Isabell A wrote: Reason for CRM: Patient spoke with [facility] for a new hose for his nebulizer but he was told he is eligible for a new machine once a prescription is sent in.   Callback number: [phone]  Spoke w/ PT order placed    - NFN

## 2023-09-02 NOTE — Telephone Encounter (Signed)
 Okay TY

## 2023-09-02 NOTE — Telephone Encounter (Signed)
 LOV notes updated patient is seen yearly

## 2023-09-02 NOTE — Telephone Encounter (Signed)
 LOV Faxed to [facility].

## 2023-09-18 ENCOUNTER — Ambulatory Visit: Admitting: Internal Medicine

## 2023-09-18 ENCOUNTER — Other Ambulatory Visit: Payer: Self-pay

## 2023-09-18 ENCOUNTER — Encounter: Payer: Self-pay | Admitting: Internal Medicine

## 2023-09-18 VITALS — BP 109/75 | HR 85 | Temp 98.6°F | Resp 16 | Wt 151.2 lb

## 2023-09-18 DIAGNOSIS — B20 Human immunodeficiency virus [HIV] disease: Secondary | ICD-10-CM

## 2023-09-18 DIAGNOSIS — K279 Peptic ulcer, site unspecified, unspecified as acute or chronic, without hemorrhage or perforation: Secondary | ICD-10-CM | POA: Diagnosis not present

## 2023-09-18 DIAGNOSIS — B9681 Helicobacter pylori [H. pylori] as the cause of diseases classified elsewhere: Secondary | ICD-10-CM | POA: Diagnosis not present

## 2023-09-18 DIAGNOSIS — A319 Mycobacterial infection, unspecified: Secondary | ICD-10-CM

## 2023-09-18 DIAGNOSIS — Z23 Encounter for immunization: Secondary | ICD-10-CM | POA: Diagnosis not present

## 2023-09-18 NOTE — Progress Notes (Signed)
 Subjective:    Patient ID: Calvin Schroeder, male  DOB: [birth year], 63 y.o.        MRN: [id number]   HPI 63yo M previously found to have cystic lesion on chest CT on adm for PNA. 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 He was seen in ID 03-2017 was found to have AIDS. He was started on E/A/R however developed n/v (that persisted despite zofran )  Restarted MAI rx ~12-2017.    He was started on tivicay -descovy . He has had f/u with pulm for COPD.  He has also f/u with CVTS for a cyctic mass in LUL.  His last CVTS f/u was 06-2019- He had repeat CT at that time that showed: 1. The large thick-walled, cavitary left upper lobe lesion has not significantly changed in size from the most recent CT and is most consistent with postinflammatory scarring given relative stability for several years. 2. There is increased multifocal nodularity at the left apex superior to this cavitary lesion, which appears inflammatory. Continued follow-up in 6-12 months suggested. 3. Additional bilateral pulmonary nodules are stable. No adenopathy or pleural effusion. 4. Aortic Atherosclerosis (ICD10-I70.0) and Emphysema (ICD10-J43.9).   He had f/u appt with Pulm 07-09-19 and had repeat Cx showing M abscessus.       Mycobacterium abscessus complex  Amikacin  4.0 ug/mL Susceptible  Cefoxitin Comment  Comment: 32.0 ug/mL Intermediate  Ciprofloxacin >4.0 ug/mL Resistant  Clarithromycin >16.0 ug/mL Resistant  Comment: This organism has been evaluated for inducible macrolide resistance.  Doxycycline  16.0 ug/mL Resistant  Imipenem Comment  Comment: 8.0 ug/mL Intermediate  Linezolid  Comment  Comment: 1.0 ug/mL or less, Susceptible  Minocycline >8.0 ug/mL Resistant  Moxifloxacin Comment  Comment: 2.0 ug/mL Intermediate  Tigecycline 0.25 ug/mL  Tobramycin CANCELED  Comment: Test not performed   Result canceled by the ancillary.  Trimethoprim /Sulfa  Comment  Comment: >8/152 ug/mL Resistant    He has also been seen by Onc  for KS.   He was seen in ID and by pharm in August- his M abscessus therapy was changed to  clofazamine (which he has not been able to tolerate due to n/v), linezolid  and omdacycline. He did not take.  September 2021, he was given arikace. He quit taking the clofazamine (diarrhea). He also took zyvox  which made him nauseated. He did not start omadacycline , too scared. October 2021- not clear he was taking any of his M abscessus therapy. Reinforced need to take.  November 30-21 CT scan- 1. Continued decrease in size of LEFT upper lobe cavitary area compatible with post infectious scarring and cavitation. Diminishednodularity about this area compatible with post infectious or inflammatory changes. 2. Nodules in the RIGHT chest are stable to slightly diminished in size but within 1 mm of previous measurements. Continued attention on follow-up is suggested, stable since December 2020, shown to be cavitary in August of 2020 also likely post infectious but with limited change in size. 3. Emphysema and aortic atherosclerosis.   He has had intermittent adherence to his M abscesses rx (better with the arykace, difficult with timing of meds and meals).     He has had the neck mass removed 3 weeks prior. (12-27-19 [facility]- path- thyroglossal duct cyst).  Pending cervical fusion.    12-2020 he has f/u with CVTS, and  repeat CT scan following.  He is taking his M abscessus rx (since July 2022) (but he can't name them.  Severe pulmonary emphysema with paraseptal in centrilobular emphysematous changes.   Area with cavitation in the LEFT  upper lobe with bandlike changes that extend both the pleural surface and the LEFT suprahilar region, associated with retraction of the major fissure anteriorly. Also similar when compared to the study of December 07, 2019, diminished in size compared to more remote imaging.   Additional areas of nodularity throughout the chest which are also similar to previous imaging with  scattered areas of grouped nodularity throughout the chest without substantial change. Findings are compatible with chronic infection. Continued surveillance may be warranted given the spiculated nature of some of these areas particularly in the RIGHT upper lobe. Consider lung cancer screening if appropriate or 12 month follow-up as warranted.   No problems with DTGV/descovey.  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 Denies MSM activity.  Recently eval for hypoK after his blood work here. Given supplement. No diarrhea; has had regular emesis (dry heaves every morning). Doesn't feel like it comes from his stomach. Has noted since removal of thyroglossal duct cyst.  States he has been taking his M absccesus rx. States [name] is not going to do another CT til Dec.  2-3 mixed drinks/day.   HIV 1 RNA Quant  Date Value  05/13/2023 NOT DETECTED copies/mL  11/19/2022 Not Detected Copies/mL  06/18/2022 Not Detected Copies/mL   CD4 T Cell Abs (/uL)  Date Value  11/19/2022 490  02/05/2022 420  06/14/2021 344 (L)   ------------------- 02/19/2022 id clinic f/u #hiv heterosexual Well controlled on tivicay /descovy . 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 He is currently on maintenance linezolid , omadocycline, and arikayce . No numbness/tingling, dyspnea. Has lots of nausea He gets sob coming back from the mailbox at his home as it sloped up. Not change in activity tolerance the past year No f/c/nightsweat  He has copd and uses inhalers. He does have lung doctors. His last pft 07/2021 showed fev1 55% decreased from 2019. He had afb sputum 07/30/19 that was negative but also positive test a few weeks prior to that  He still smokes 01/2022 chest ct stable lul cavitary lesions but new 1.3 cm nodule. He had seen  pulmonology and plan to repeat chest ct in 6 months    04/01/22 id clinic f/u Patient here for f/u sputum cx and ntm lung infection 2/27 afb sputum m-abscessus pending susceptibility 3/04 and 3/14 sputum growing afb not yet identified He ran out of linezolid , arykace, and omadocycline 09/2021 and hadn't filled it again As from previous visit, his respiratory/functional status has been stable He continues to smoke I would guesstimate his functional status is around MET 4-5    06/18/22 id clinic visit He had a chest ct 04/23/22 for f/u hx m-abscessus lung disease. This showed stable disease Patient again confirms (as in 03/2022 visit) that he had stopped the omadocycline, arykace, and linezolid  on his own.  He had continued to grow m-abscessus in his sputum as of 03/2022. His last sputum before that was in 2021 and he was growing m-abscessus as well He last grow mac in the sputum in 02/2017  He was on mac treatment either end of 2018 until 2021 --> no change in respiratory sx. His mac regimen include azithromycin , rifampin , and ethambutol , which he stopped the rifampin  after a few doses due to side effect. In mid 2021 when abscessus was found (no mac) his ntm regimen was changed to  arykace/clofazimine /omadocycline and zyvox   He does have severe copd.  No change in health since last visit No missed dose of biktarvy last 4 weeks. He has been taking since 02/2022 id visit with me  Social -- staying with his former wife but they are legally separated   11/19/22 id clinic f/u See a&p No missed dose biktarvy last 4 weeks Off m-abscessus tx since late 2023 and stable clinically Actually gaining weight Still smoking though No sob or coughing more the last 2 years No fever/chill Appetite is good Not sexually active - doesn't want std testing Has pcp and will see her soon for physical exam/cancer screening    07/03/23 id clinic f/u See a&p Nausea chronic intermittent Hiv compliant with  biktarvy Lft and cr up    09/18/23 id clinic f/u Patient [facility] for n/v and had egd 08/28/23 found hpylori given a quadruple regimen bismuth/ppi/metro/doxy on 8/27 Patient took about a week but too much symptoms so he stopped. He'll call gi again today Compliant with biktarvy No concern in health otherwise  He asks about why CTS had stopped seeing him (was at some point for lung mass/cavitary changes (cx MAC)  We also had stopped mac treatment  Discuss he'll need to f/u pcp or pulm for ongoing lung cancer screening. Still smoking   No other complaint   Health Maintenance  Topic Date Due   Hepatitis B Vaccines 19-59 Average Risk (1 of 3 - Risk 3-dose series) Never done   Influenza Vaccine  08/08/2023   COVID-19 Vaccine (6 - 2025-26 season) 09/08/2023   Lung Cancer Screening  01/09/2024   DTaP/Tdap/Td (3 - Td or Tdap) 07/02/2028   Colonoscopy  01/07/2029   Pneumococcal Vaccine: 50+ Years  Completed   Hepatitis C Screening  Completed   HIV Screening  Completed   Zoster Vaccines- Shingrix  Completed   HPV VACCINES  Aged Out   Meningococcal B Vaccine  Aged Out     Ros: All other ros negative      Objective:  Physical Exam Vitals reviewed.  Constitutional:      Appearance: Normal appearance.  HENT:     Mouth/Throat:     Mouth: Mucous membranes are moist.     Pharynx: No oropharyngeal exudate.  Eyes:     Extraocular Movements: Extraocular movements intact.     Pupils: Pupils are equal, round, and reactive to light.  Cardiovascular:     Rate and Rhythm: Normal rate and regular rhythm.  Pulmonary:     Effort: Pulmonary effort is normal.  Abdominal:     General: Bowel sounds are normal. There is no distension.     Palpations: Abdomen is soft.  Musculoskeletal:     Cervical back: Normal range of motion and neck supple.     Right lower leg: No edema.     Left lower leg: No edema.  Neurological:     General: No focal deficit present.     Mental Status: He  is alert.     Labs: Lab Results  Component Value Date   WBC 5.6 08/18/2023   HGB 14.7 08/18/2023   HCT 43.3 08/18/2023   MCV 104.6 (H) 08/18/2023   PLT 215 08/18/2023   Last metabolic panel Lab Results  Component Value Date   GLUCOSE 87 08/18/2023   NA 141 08/18/2023   K 3.4 (L) 08/18/2023   CL 104 08/18/2023   CO2 26 08/18/2023   BUN 8 08/18/2023   CREATININE 0.67 (L) 08/18/2023  EGFR 104 06/18/2022   CALCIUM  9.1 08/18/2023   PROT 7.6 08/18/2023   ALBUMIN 3.7 07/02/2019   BILITOT 0.6 08/18/2023   ALKPHOS 63 07/02/2019   AST 32 08/18/2023   ALT 25 08/18/2023   ANIONGAP 10 06/22/2021    Imaging: Reviewed   04/23/22 chest ct 1. No significant change in size of cavitary process within the left upper lobe. 2. The new nodular lesion described on the exam from 01/29/2022 has decreased in size from the previous exam current Leigh measuring 6 mm versus 1.2 cm previously. This is compatible with a benign inflammatory/infectious process. 3. Additional bilateral pulmonary nodules are again noted. These are stable to mildly decreased in size when compared with previous exam. 4. Hepatic steatosis. 5. Coronary artery calcifications. 6.  Aortic Atherosclerosis    08/27/22 chest ct 1. Chronic cavitary process in the left lung apex with adjacent bandlike scarring, airway thickening, and airway plugging. The cavitary process is slightly similar to 04/23/2022 and smaller than on 06/16/2019. 2. Other areas of chronic nodularity and branching nodularity bilaterally are stable from 06/16/2019, compatible with benign postinflammatory/infectious process. 3. Severe emphysema. 4. Diffuse hepatic steatosis. 5. Healing fracture of the right anterior fifth rib, probably subacute superimposed on old deformity.      Assessment & Plan:  #hiv #reprieve heterosexual Changed to biktarvy 02/2022 and doing well on that  06/2023 no missed dose biktarvy last 4 weeks.   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 Couldn't tolerate rifampin . 07/2019 sputum doesn't grow mac any more, but grew m-abscessus #m-abscessus #copd #pulm nodule #lul cavitary 07/2019 last sputum obtained. I don't have any sputum after that and that grew m-abscessus. His mac havent grown then.  M-abscessus tx 08/2019 until 09/2021 and per patient not consistently taking during those time 01/2022 ct stable outside of 1 new nodule -- he still smokes so ?unclear etiology 04/2022 ct stable and the nodule size slightly improved since 01/2022 Symptomatically stable functional status No fever/chill/weight loss for at least as his time with me since 02/2022. Minimal cough  At this time given persistent growth despite on more than 2 years of m-abscessus treatment, and stable ct and minimal sx, will continue to watch off antibiotics  He mentioned previously last took any m-abscessus medication 09/2021 when he ran out  08/2022 chest ct stable cavitary/scarring; clinically stable as well; no sign of b sx  09/18/23 assymptomatic/stable pulm status  -continue observing off antibiotics for m-abscessus -f/u pulmonology vs primary care for ongoing lung cancer screening   #h pylori Confirmed on egd 08/28/23 with [facility] Stopped abx due to side effect  -advise f/u gi   #elevated cr 05/2023 cr 1.2 >20% rise from baseline 0.7 ?dehydration vs biktarvy vs other intrarenal process?  08/18/23 repeat kidney function cr 0.67   #elevated lft Normalized 08/18/23   #hcm -vaccination Flu shot today 09/2023 Prevnar13 2019 Tdap 2020 Shingrix x2 shots by 05/2020 Meingococcal booster 08/2019 Patient discussed rsv vaccine and I agree and he can get  from primary care -hepatitis 2019 hep b sAb reactive, hep b cAb and sAg negative 2019 hep c ab negative 2024 hep b sAb 650 -tb 11/2022 quantiferon gold negative -std 05/2023 rpr nr 01/2022 urine gc/chlam negative Not  sexually active too long. Deferred at his request -cancer screening Defer to pcp Lung cancer screening as mentioned above

## 2023-09-18 NOTE — Patient Instructions (Signed)
 Please follow up with GI for h pylori care   See me in 05/2024 for hiv and labs   See pulmonology or PCP for ongoing lung cancer screening   Flu shot today

## 2023-10-19 ENCOUNTER — Other Ambulatory Visit: Payer: Self-pay | Admitting: Pulmonary Disease

## 2023-10-19 DIAGNOSIS — J329 Chronic sinusitis, unspecified: Secondary | ICD-10-CM

## 2023-10-27 ENCOUNTER — Telehealth: Payer: Self-pay

## 2023-10-27 NOTE — Telephone Encounter (Signed)
 Copied from CRM [id number]. Topic: Clinical - Order For Equipment >> Oct 24, 2023  4:16 PM Leila BROCKS wrote: Reason for CRM: Patient [phone] states [facility] [id number] is eligible for a new cpap machine and needs a new order from Dr. Kara. Paitient is unsure where and what company the new order should be sent to. Please advise and call back.  Dr. Kara please advise if okay to place a new CPAP machine order.

## 2023-11-03 NOTE — Telephone Encounter (Signed)
 Ok to place new order.  JD

## 2023-11-10 ENCOUNTER — Encounter: Payer: Self-pay | Admitting: Radiology

## 2023-11-27 ENCOUNTER — Other Ambulatory Visit: Payer: Self-pay | Admitting: Pulmonary Disease

## 2023-11-28 ENCOUNTER — Telehealth: Payer: Self-pay

## 2023-11-28 NOTE — Telephone Encounter (Signed)
 Copied from CRM [id number]. Topic: Clinical - Order For Equipment >> Nov 27, 2023  3:52 PM Celestine FALCON wrote: Reason for CRM: Pt is requesting a new cpap machine due to being eligible for a replacement after having it for 5 years. Pt stated he would like to continue to go through [facility].  [facility] is showing the following information.   Address - [address] [place], [place] [address]  General Hours - Mon - Fri 8:30am - 5:00pm  Phone Number - [phone] Fax Number - [phone]  Pt's phone number is [phone] ok to leave a vm.     Spoke w/ patient and [facility] made appointment to re establish OSA care    -NFN

## 2023-12-01 NOTE — Telephone Encounter (Signed)
 Ok for me to see him. He is scheduled 11/26.

## 2023-12-02 NOTE — Telephone Encounter (Signed)
 NFN

## 2023-12-03 ENCOUNTER — Encounter: Payer: Self-pay | Admitting: Pulmonary Disease

## 2023-12-03 ENCOUNTER — Ambulatory Visit: Admitting: Pulmonary Disease

## 2023-12-03 VITALS — BP 109/74 | HR 64 | Ht 70.0 in | Wt 160.0 lb

## 2023-12-03 DIAGNOSIS — R911 Solitary pulmonary nodule: Secondary | ICD-10-CM

## 2023-12-03 DIAGNOSIS — J439 Emphysema, unspecified: Secondary | ICD-10-CM

## 2023-12-03 DIAGNOSIS — G4733 Obstructive sleep apnea (adult) (pediatric): Secondary | ICD-10-CM

## 2023-12-03 DIAGNOSIS — F17210 Nicotine dependence, cigarettes, uncomplicated: Secondary | ICD-10-CM | POA: Diagnosis not present

## 2023-12-03 NOTE — Progress Notes (Signed)
 Established Patient Pulmonology Office Visit   Subjective:  Patient ID: Calvin Schroeder, male    DOB: [birth year]  MRN: [id number]  CC:  Chief Complaint  Patient presents with   Medical Management of Chronic Issues    Pt states needs new cpap    Discussed the use of AI scribe software for clinical note transcription with the patient, who gave verbal consent to proceed.  History of Present Illness Calvin Schroeder is a 63 year old male with sleep apnea who presents for a new CPAP machine.  He was diagnosed with sleep apnea in 2020 with an apnea-hypopnea index of 5.2 events per hour and has used CPAP regularly since that sleep study. He has continued to note benefit from  using CPAP machine.  He has COPD and emphysema and smokes 1 pack per day with a 46-year smoking history. He had a lung mass attributed to MAC infection and has not had a CT scan since January 2025. He was followed with serial CT scans by a heart and thoracic surgeon but has been released from that care.   He recently obtained a nebulizer from [employer] and manages his respiratory symptoms with an inhaler, which he uses effectively.        ROS    Current Outpatient Medications:    amLODipine (NORVASC) 10 MG tablet, Take 10 mg by mouth., Disp: , Rfl:    atorvastatin  (LIPITOR) 40 MG tablet, TAKE 1 TABLET(40 MG) BY MOUTH DAILY, Disp: 30 tablet, Rfl: 5   bictegravir-emtricitabine -tenofovir  AF (BIKTARVY) 50-200-25 MG TABS tablet, Take 1 tablet by mouth daily., Disp: 30 tablet, Rfl: 11   Cholecalciferol 25 MCG (1000 UT) capsule, Take 1,000 Units by mouth daily., Disp: , Rfl:    fluticasone  (FLONASE ) 50 MCG/ACT nasal spray, SHAKE LIQUID AND USE 1 SPRAY IN EACH NOSTRIL DAILY, Disp: 16 g, Rfl: 2   folic acid  (FOLVITE ) 1 MG tablet, Take 1 mg by mouth daily., Disp: , Rfl:    ipratropium-albuterol  (DUONEB) 0.5-2.5 (3) MG/3ML SOLN, INHALE VIA NEBULIZER EVERY 6 HOURS AS NEEDED, Disp: 3 mL, Rfl: 6   losartan (COZAAR) 50 MG  tablet, Take 50 mg by mouth daily., Disp: , Rfl:    Misc. Devices KIT, Flutter Valve, Disp: 1 kit, Rfl: 0   ondansetron  (ZOFRAN ) 8 MG tablet, Take 1 tablet (8 mg total) by mouth every 8 (eight) hours as needed for nausea or vomiting., Disp: 30 tablet, Rfl: 5   oxyCODONE -acetaminophen  (PERCOCET) 10-325 MG tablet, Take 1 tablet by mouth 4 (four) times daily as needed., Disp: , Rfl:    Tiotropium Bromide-Olodaterol (STIOLTO RESPIMAT ) 2.5-2.5 MCG/ACT AERS, Inhale 2 puffs into the lungs daily., Disp: 4 g, Rfl: 11   VENTOLIN  HFA 108 (90 Base) MCG/ACT inhaler, INHALE 2 PUFFS INTO THE LUNGS EVERY 6 HOURS AS NEEDED FOR WHEEZING OR SHORTNESS OF BREATH, Disp: 18 g, Rfl: 11      Objective:  BP 109/74   Pulse 64   Ht 5' 10 (1.778 m) Comment: per pt  Wt 160 lb (72.6 kg)   SpO2 97%   BMI 22.96 kg/m     Physical Exam Constitutional:      General: He is not in acute distress.    Appearance: Normal appearance.  Eyes:     General: No scleral icterus.    Conjunctiva/sclera: Conjunctivae normal.  Cardiovascular:     Rate and Rhythm: Normal rate and regular rhythm.  Pulmonary:     Breath sounds: No wheezing, rhonchi or  rales.  Musculoskeletal:     Right lower leg: No edema.     Left lower leg: No edema.  Skin:    General: Skin is warm and dry.  Neurological:     General: No focal deficit present.    Diagnostic Review:       Assessment & Plan:   Assessment & Plan OSA on CPAP  Orders:   Home sleep test; Future  Cigarette smoker  Orders:   Ambulatory Referral for Lung Cancer Scre   Assessment and Plan Assessment & Plan Obstructive sleep apnea Mild obstructive sleep apnea with AHI of 5.2 events/hour. Currently on CPAP therapy. Requires updated sleep study for CPAP settings. - Ordered home sleep study - Continue current CPAP until further evaluation by sleep study - Will plan to order new CPAP machine based on new sleep study results  Chronic obstructive pulmonary disease with  emphysema COPD with emphysema, on inhaler therapy. - Continue stiolto and as needed albuterol   Pulmonary nodule, previously infectious (MAC) Pulmonary nodule previously identified as MAC. Requires monitoring due to smoking history. - Referred to lung cancer screening program for annual CT scan. - Schedule CT scan at [facility].  Nicotine dependence, cigarettes 46-year smoking history, currently smoking one pack/day. Eligible for lung cancer screening. - Enrolled in lung cancer screening program.      Return in about 3 months (around 03/04/2024) for f/u visit Dr. Kara.   Dorn KATHEE Kara, MD

## 2023-12-03 NOTE — Patient Instructions (Signed)
 We will schedule you for a home sleep study  Continue your CPAP for now, do not wear CPAP the night(s) you do the home sleep study  Continue stiolto 2 puffs daily  We will refer you to our lung cancer screening team to schedule you for a CT Chest scan in January  Follow up in 3 months to review sleep study results

## 2023-12-08 ENCOUNTER — Telehealth: Payer: Self-pay

## 2023-12-08 ENCOUNTER — Encounter: Payer: Self-pay | Admitting: Pulmonary Disease

## 2023-12-08 DIAGNOSIS — Z122 Encounter for screening for malignant neoplasm of respiratory organs: Secondary | ICD-10-CM

## 2023-12-08 DIAGNOSIS — F17210 Nicotine dependence, cigarettes, uncomplicated: Secondary | ICD-10-CM

## 2023-12-08 DIAGNOSIS — Z87891 Personal history of nicotine dependence: Secondary | ICD-10-CM

## 2023-12-08 NOTE — Telephone Encounter (Signed)
 Lung Cancer Screening Narrative/Criteria Questionnaire (Cigarette Smokers Only- No Cigars/Pipes/vapes)   Calvin Schroeder   SDMV:12/16/2023 at 8:30 am [name]        [birth year]               LDCT: 12/18/2023 at 11:40 am GI    63 y.o.   Phone: [phone]  Lung Screening Narrative (confirm age 77-77 yrs Medicare / 50-80 yrs Private pay insurance)   Insurance information:Medicaid   Referring Provider: Emilio, [place]   This screening involves an initial phone call with a team member from our program. It is called a shared decision making visit. The initial meeting is required by  insurance and Medicare to make sure you understand the program. This appointment takes about 15-20 minutes to complete. You will complete the screening scan at your scheduled date/time.  This scan takes about 5-10 minutes to complete. You can eat and drink normally before and after the scan.  Criteria questions for Lung Cancer Screening:   Are you a current or former smoker? Current Age began smoking: 17   If you are a former smoker, what year did you quit smoking? Never quit over one year. (within 15 yrs)   To calculate your smoking history, I need an accurate estimate of how many packs of cigarettes you smoked per day and for how many years. (Not just the number of PPD you are now smoking)   Years smoking 45 x Packs per day 1.5 = Pack years 67.5   (at least 20 pack yrs)   (Make sure they understand that we need to know how much they have smoked in the past, not just the number of PPD they are smoking now)  Do you have a personal history of cancer?  No    Do you have a family history of cancer? Yes  (cancer type and and relative) Mother had lung cancer.   Are you coughing up blood?  No  Have you had unexplained weight loss of 15 lbs or more in the last 6 months? No  It looks like you meet all criteria.  When would be a good time for us  to schedule you for this screening?   Additional information: N/A

## 2023-12-16 ENCOUNTER — Encounter: Payer: Self-pay | Admitting: *Deleted

## 2023-12-16 ENCOUNTER — Ambulatory Visit

## 2023-12-16 DIAGNOSIS — F17210 Nicotine dependence, cigarettes, uncomplicated: Secondary | ICD-10-CM

## 2023-12-16 NOTE — Progress Notes (Signed)
 Virtual Visit via Video Note  I connected with Calvin Schroeder on 12/16/23 at  8:30 AM EST by a video enabled telemedicine application and verified that I am speaking with the correct person using two identifiers.  Location: Patient: in home Provider: [address], [place], [place], [address]     Shared Decision Making Visit Lung Cancer Screening Program [phone])   Eligibility: Age 63 y.o. Pack Years Smoking History Calculation 67.5 (# packs/per year x # years smoked) Recent History of coughing up blood  no Unexplained weight loss? no ( >Than 15 pounds within the last 6 months ) Prior History Lung / other cancer no (Diagnosis within the last 5 years already requiring surveillance chest CT Scans). Smoking Status Current Smoker Former Smokers: Years since quit:  NA  Quit Date: NA  Visit Components: Discussion included one or more decision making aids. yes Discussion included risk/benefits of screening. yes Discussion included potential follow up diagnostic testing for abnormal scans. yes Discussion included meaning and risk of over diagnosis. yes Discussion included meaning and risk of False Positives. yes Discussion included meaning of total radiation exposure. yes  Counseling Included: Importance of adherence to annual lung cancer LDCT screening. yes Impact of comorbidities on ability to participate in the program. yes Ability and willingness to under diagnostic treatment. yes  Smoking Cessation Counseling: Current Smokers:  Discussed importance of smoking cessation. yes Information about tobacco cessation classes and interventions provided to patient. yes Patient provided with ticket for LDCT Scan. yes Symptomatic Patient. no  Counseling NA Diagnosis Code: Tobacco Use Z72.0 Asymptomatic Patient yes  Counseling (Intermediate counseling: > three minutes counseling) H9563  Counseled patient 4 minutes regarding tobacco use.   Former Smokers:  Discussed the  importance of maintaining cigarette abstinence. yes Diagnosis Code: Personal History of Nicotine Dependence. S12.108 Information about tobacco cessation classes and interventions provided to patient. Yes Patient provided with ticket for LDCT Scan. yes Written Order for Lung Cancer Screening with LDCT placed in Epic. Yes (CT Chest Lung Cancer Screening Low Dose W/O CM) [id number] Z12.2-Screening of respiratory organs Z87.891-Personal history of nicotine dependence   Josette Ranger, RN 12/16/23

## 2023-12-16 NOTE — Patient Instructions (Signed)

## 2023-12-18 ENCOUNTER — Inpatient Hospital Stay: Admission: RE | Admit: 2023-12-18 | Discharge: 2023-12-18 | Attending: Acute Care | Admitting: Acute Care

## 2023-12-18 DIAGNOSIS — F17210 Nicotine dependence, cigarettes, uncomplicated: Secondary | ICD-10-CM

## 2023-12-18 DIAGNOSIS — Z122 Encounter for screening for malignant neoplasm of respiratory organs: Secondary | ICD-10-CM

## 2023-12-18 DIAGNOSIS — Z87891 Personal history of nicotine dependence: Secondary | ICD-10-CM

## 2023-12-23 ENCOUNTER — Other Ambulatory Visit: Payer: Self-pay

## 2023-12-23 DIAGNOSIS — F17210 Nicotine dependence, cigarettes, uncomplicated: Secondary | ICD-10-CM

## 2023-12-23 DIAGNOSIS — Z122 Encounter for screening for malignant neoplasm of respiratory organs: Secondary | ICD-10-CM

## 2023-12-23 DIAGNOSIS — Z87891 Personal history of nicotine dependence: Secondary | ICD-10-CM

## 2023-12-27 ENCOUNTER — Other Ambulatory Visit: Payer: Self-pay | Admitting: Internal Medicine

## 2023-12-27 DIAGNOSIS — B20 Human immunodeficiency virus [HIV] disease: Secondary | ICD-10-CM

## 2024-01-13 ENCOUNTER — Ambulatory Visit

## 2024-01-13 DIAGNOSIS — G4733 Obstructive sleep apnea (adult) (pediatric): Secondary | ICD-10-CM

## 2024-01-21 ENCOUNTER — Telehealth (INDEPENDENT_AMBULATORY_CARE_PROVIDER_SITE_OTHER): Payer: Self-pay | Admitting: Pulmonary Disease

## 2024-01-21 DIAGNOSIS — G4733 Obstructive sleep apnea (adult) (pediatric): Secondary | ICD-10-CM | POA: Diagnosis not present

## 2024-01-21 NOTE — Telephone Encounter (Signed)
 WatchPAT HST - AHI3%: 16.5 - AHI4%: 10.7 - SpO2 Nadir: 74% - Time spent with SpO2 <\= 88% (as % of TST): 74.5%  Diagnoses: Moderate OSA Sleep-related hypoxemia  Office staff to notify patient of result. Recommend AutoCPAP 5-15 cm H2O with P-10 nasal pillows mask + chin strap. Also recommend overnight oximetry to assess burden of residual hypoxemia (if any) while patient is on CPAP. If desired, CPAP +/- O2 titration in the sleep lab is a reasonable alternative.  Cc: Referring physician Dr. Kara.  Lamar JINNY Dales, MD

## 2024-01-23 ENCOUNTER — Telehealth: Payer: Self-pay

## 2024-01-23 NOTE — Telephone Encounter (Signed)
 Copied from CRM [id number]. Topic: Clinical - Lab/Test Results >> Jan 22, 2024  1:37 PM Calvin Schroeder wrote: Reason for CRM: Patient is calling to request results of sleep test be provided to him, as he has some questions. Please return call to patient.    Spoke with patient VBU, scanned in day befroe MD has yet to review it

## 2024-01-27 ENCOUNTER — Ambulatory Visit: Payer: Self-pay | Admitting: Pulmonary Disease

## 2024-01-27 NOTE — Telephone Encounter (Signed)
 Spoke with patient Calvin Schroeder, - NFN

## 2024-01-27 NOTE — Telephone Encounter (Signed)
 NFN

## 2024-01-29 ENCOUNTER — Other Ambulatory Visit: Payer: Self-pay | Admitting: Pulmonary Disease

## 2024-01-29 DIAGNOSIS — J329 Chronic sinusitis, unspecified: Secondary | ICD-10-CM

## 2024-02-06 ENCOUNTER — Telehealth: Payer: Self-pay

## 2024-02-06 DIAGNOSIS — G4733 Obstructive sleep apnea (adult) (pediatric): Secondary | ICD-10-CM

## 2024-02-06 NOTE — Telephone Encounter (Signed)
 Copied from CRM #[id number]. Topic: Clinical - Order For Equipment >> Feb 06, 2024 11:04 AM Calvin Schroeder wrote: Reason for CRM: pt calling about his new CPAP machine but no referral showing for order.  Orders never placed,placed order should be hearing back soon

## 2024-02-06 NOTE — Telephone Encounter (Signed)
 See 1/20 and 1/14 encounter.  Dr. Kara advised OK to send CPAP order.

## 2024-03-01 ENCOUNTER — Ambulatory Visit: Admitting: Pulmonary Disease

## 2024-03-17 ENCOUNTER — Ambulatory Visit: Admitting: Internal Medicine
